# Patient Record
Sex: Male | Born: 1945 | Race: Black or African American | Hispanic: No | Marital: Married | Smoking: Former smoker
Health system: Southern US, Community
[De-identification: ages and names within clinical notes are randomized; demographics above are authoritative.]

## PROBLEM LIST (undated history)

## (undated) DIAGNOSIS — I10 Essential (primary) hypertension: Secondary | ICD-10-CM

---

## 1998-07-18 ENCOUNTER — Emergency Department (HOSPITAL_COMMUNITY): Admission: EM | Admit: 1998-07-18 | Discharge: 1998-07-18 | Payer: Self-pay | Admitting: Emergency Medicine

## 1998-07-19 ENCOUNTER — Encounter: Payer: Self-pay | Admitting: Internal Medicine

## 1998-07-19 ENCOUNTER — Inpatient Hospital Stay (HOSPITAL_COMMUNITY): Admission: EM | Admit: 1998-07-19 | Discharge: 1998-07-21 | Payer: Self-pay | Admitting: Emergency Medicine

## 1999-05-02 ENCOUNTER — Emergency Department (HOSPITAL_COMMUNITY): Admission: EM | Admit: 1999-05-02 | Discharge: 1999-05-02 | Payer: Self-pay | Admitting: Emergency Medicine

## 1999-05-02 ENCOUNTER — Encounter: Payer: Self-pay | Admitting: Emergency Medicine

## 2002-10-17 ENCOUNTER — Encounter: Payer: Self-pay | Admitting: Internal Medicine

## 2002-10-17 ENCOUNTER — Encounter: Admission: RE | Admit: 2002-10-17 | Discharge: 2002-10-17 | Payer: Self-pay | Admitting: Internal Medicine

## 2008-04-08 ENCOUNTER — Ambulatory Visit: Payer: Self-pay | Admitting: Internal Medicine

## 2008-04-15 ENCOUNTER — Other Ambulatory Visit: Admission: RE | Admit: 2008-04-15 | Discharge: 2008-04-15 | Payer: Self-pay | Admitting: Internal Medicine

## 2008-04-15 LAB — COMPREHENSIVE METABOLIC PANEL
ALT: 13 U/L (ref 0–53)
AST: 17 U/L (ref 0–37)
Albumin: 4.1 g/dL (ref 3.5–5.2)
Alkaline Phosphatase: 59 U/L (ref 39–117)
BUN: 12 mg/dL (ref 6–23)
CO2: 26 mEq/L (ref 19–32)
Calcium: 8.6 mg/dL (ref 8.4–10.5)
Chloride: 101 mEq/L (ref 96–112)
Creatinine, Ser: 0.83 mg/dL (ref 0.40–1.50)
Glucose, Bld: 102 mg/dL — ABNORMAL HIGH (ref 70–99)
Potassium: 3.9 mEq/L (ref 3.5–5.3)
Sodium: 142 mEq/L (ref 135–145)
Total Bilirubin: 0.3 mg/dL (ref 0.3–1.2)
Total Protein: 7.7 g/dL (ref 6.0–8.3)

## 2008-04-15 LAB — CBC WITH DIFFERENTIAL/PLATELET
BASO%: 0.6 % (ref 0.0–2.0)
Basophils Absolute: 0 10*3/uL (ref 0.0–0.1)
EOS%: 0.5 % (ref 0.0–7.0)
Eosinophils Absolute: 0 10*3/uL (ref 0.0–0.5)
HCT: 41.5 % (ref 38.7–49.9)
HGB: 13.9 g/dL (ref 13.0–17.1)
LYMPH%: 53 % — ABNORMAL HIGH (ref 14.0–48.0)
MCH: 31 pg (ref 28.0–33.4)
MCHC: 33.4 g/dL (ref 32.0–35.9)
MCV: 92.6 fL (ref 81.6–98.0)
MONO#: 0.7 10*3/uL (ref 0.1–0.9)
MONO%: 8.2 % (ref 0.0–13.0)
NEUT#: 3.3 10*3/uL (ref 1.5–6.5)
NEUT%: 37.7 % — ABNORMAL LOW (ref 40.0–75.0)
Platelets: 276 10*3/uL (ref 145–400)
RBC: 4.49 10*6/uL (ref 4.20–5.71)
RDW: 16.8 % — ABNORMAL HIGH (ref 11.2–14.6)
WBC: 8.8 10*3/uL (ref 4.0–10.0)
lymph#: 4.6 10*3/uL — ABNORMAL HIGH (ref 0.9–3.3)

## 2008-04-18 LAB — FLOW CYTOMETRY

## 2008-04-24 LAB — CBC WITH DIFFERENTIAL/PLATELET
Basophils Absolute: 0 10*3/uL (ref 0.0–0.1)
Eosinophils Absolute: 0 10*3/uL (ref 0.0–0.5)
HCT: 42.9 % (ref 38.7–49.9)
HGB: 14.3 g/dL (ref 13.0–17.1)
LYMPH%: 62.2 % — ABNORMAL HIGH (ref 14.0–48.0)
MCHC: 33.2 g/dL (ref 32.0–35.9)
MONO#: 0.5 10*3/uL (ref 0.1–0.9)
NEUT#: 2.8 10*3/uL (ref 1.5–6.5)
NEUT%: 31 % — ABNORMAL LOW (ref 40.0–75.0)
Platelets: 219 10*3/uL (ref 145–400)
WBC: 9 10*3/uL (ref 4.0–10.0)
lymph#: 5.6 10*3/uL — ABNORMAL HIGH (ref 0.9–3.3)

## 2008-07-19 ENCOUNTER — Ambulatory Visit: Payer: Self-pay | Admitting: Internal Medicine

## 2010-04-07 ENCOUNTER — Encounter (INDEPENDENT_AMBULATORY_CARE_PROVIDER_SITE_OTHER): Payer: Self-pay | Admitting: Internal Medicine

## 2010-04-07 ENCOUNTER — Ambulatory Visit (HOSPITAL_COMMUNITY)
Admission: RE | Admit: 2010-04-07 | Discharge: 2010-04-07 | Payer: Self-pay | Source: Home / Self Care | Attending: Internal Medicine | Admitting: Internal Medicine

## 2011-05-03 ENCOUNTER — Encounter: Payer: Self-pay | Admitting: Emergency Medicine

## 2011-05-03 ENCOUNTER — Emergency Department (HOSPITAL_COMMUNITY)
Admission: EM | Admit: 2011-05-03 | Discharge: 2011-05-03 | Disposition: A | Discharge status: Home / Self Care | Payer: Medicare HMO | Attending: Emergency Medicine | Admitting: Emergency Medicine

## 2011-05-03 ENCOUNTER — Emergency Department (HOSPITAL_COMMUNITY): Payer: Medicare HMO

## 2011-05-03 DIAGNOSIS — Z79899 Other long term (current) drug therapy: Secondary | ICD-10-CM | POA: Insufficient documentation

## 2011-05-03 DIAGNOSIS — S39012A Strain of muscle, fascia and tendon of lower back, initial encounter: Secondary | ICD-10-CM

## 2011-05-03 DIAGNOSIS — M25559 Pain in unspecified hip: Secondary | ICD-10-CM | POA: Insufficient documentation

## 2011-05-03 DIAGNOSIS — Z7982 Long term (current) use of aspirin: Secondary | ICD-10-CM | POA: Insufficient documentation

## 2011-05-03 DIAGNOSIS — Z794 Long term (current) use of insulin: Secondary | ICD-10-CM | POA: Insufficient documentation

## 2011-05-03 DIAGNOSIS — M545 Low back pain, unspecified: Secondary | ICD-10-CM | POA: Insufficient documentation

## 2011-05-03 DIAGNOSIS — S335XXA Sprain of ligaments of lumbar spine, initial encounter: Secondary | ICD-10-CM | POA: Insufficient documentation

## 2011-05-03 DIAGNOSIS — X58XXXA Exposure to other specified factors, initial encounter: Secondary | ICD-10-CM | POA: Insufficient documentation

## 2011-05-03 HISTORY — DX: Essential (primary) hypertension: I10

## 2011-05-03 LAB — URINALYSIS, ROUTINE W REFLEX MICROSCOPIC
Hgb urine dipstick: NEGATIVE
Ketones, ur: NEGATIVE mg/dL
Protein, ur: NEGATIVE mg/dL
Urobilinogen, UA: 0.2 mg/dL (ref 0.0–1.0)

## 2011-05-03 LAB — URINE MICROSCOPIC-ADD ON

## 2011-05-03 LAB — POCT I-STAT, CHEM 8
BUN: 15 mg/dL (ref 6–23)
Chloride: 102 mEq/L (ref 96–112)
Creatinine, Ser: 0.8 mg/dL (ref 0.50–1.35)
Sodium: 139 mEq/L (ref 135–145)
TCO2: 26 mmol/L (ref 0–100)

## 2011-05-03 MED ORDER — IBUPROFEN 600 MG PO TABS: 600.0000 mg | ORAL_TABLET | Freq: Four times a day (QID) | ORAL | Status: AC | PRN

## 2011-05-03 MED ORDER — HYDROMORPHONE HCL PF 2 MG/ML IJ SOLN
2.0000 mg | Freq: Once | INTRAMUSCULAR | Status: AC
  Administered 2011-05-03: 2 mg via SUBCUTANEOUS
  Filled 2011-05-03: qty 1

## 2011-05-03 MED ORDER — HYDROCODONE-ACETAMINOPHEN 5-500 MG PO TABS: 1.0000 | ORAL_TABLET | Freq: Four times a day (QID) | ORAL | Status: AC | PRN

## 2011-05-03 MED ORDER — FUROSEMIDE 10 MG/ML IJ SOLN
40.0000 mg | Freq: Once | INTRAMUSCULAR | Status: AC
  Administered 2011-05-03: 40 mg via INTRAVENOUS
  Filled 2011-05-03 (×2): qty 4

## 2011-05-03 NOTE — ED Provider Notes (Signed)
History     CSN: [id number]  Arrival date & time 05/03/11  1250   First MD Initiated Contact with Patient 05/03/11 1711      Chief Complaint  Patient presents with  . Flank Pain    (Consider location/radiation/quality/duration/timing/severity/associated sxs/prior treatment) HPI history from patient Patient is a 66 year old male with history of diabetes, hypertension, dyslipidemia who presents with right flank pain. This started about 2 weeks ago. It has been constant. It occasionally radiates around to the side of his right abdomen. It is worse with certain positions and bending his trunk. He has not had any associated dysuria, hematuria, nausea, vomiting, diarrhea. He is also not had any lower extremity pain or neurologic changes. He states he saw his PCP who thought this might be related to his kidney but he has no prior diagnosis of kidney stones. He is tolerating by mouth well. He has not had any recent fever. Overall severity noted to be moderate.  Past Medical History  Diagnosis Date  . Diabetes mellitus   . Hypertension     History reviewed. No pertinent past surgical history.  History reviewed. No pertinent family history.  History  Substance Use Topics  . Smoking status: Never Smoker   . Smokeless tobacco: Not on file  . Alcohol Use: Yes      Review of Systems  Constitutional: Negative for fever, chills and activity change.  HENT: Negative for congestion and neck pain.   Respiratory: Negative for cough, chest tightness, shortness of breath and wheezing.   Cardiovascular: Negative for chest pain.  Gastrointestinal: Negative for nausea, vomiting, abdominal pain, diarrhea and abdominal distention.  Genitourinary: Negative for difficulty urinating.  Musculoskeletal: Negative for gait problem.  Skin: Negative for rash.  Neurological: Negative for weakness and numbness.  Psychiatric/Behavioral: Negative for behavioral problems and confusion.  All other systems  reviewed and are negative.    Allergies  Review of patient's allergies indicates no known allergies.  Home Medications   Current Outpatient Rx  Name Route Sig Dispense Refill  . AMLODIPINE BESYLATE 10 MG PO TABS Oral Take 10 mg by mouth daily.      . ASPIRIN 325 MG PO TABS Oral Take 325 mg by mouth daily.      . ATORVASTATIN CALCIUM 40 MG PO TABS Oral Take 40 mg by mouth daily.      . CHLORTHALIDONE 25 MG PO TABS Oral Take 25 mg by mouth daily.      . GLYBURIDE 5 MG PO TABS Oral Take 5 mg by mouth 2 (two) times daily with a meal.      . INSULIN ISOPHANE HUMAN 100 UNIT/ML  SUSP Subcutaneous Inject 25-30 Units into the skin 2 (two) times daily. Take 30 units in the morning and 25 units in the evening     . LISINOPRIL 40 MG PO TABS Oral Take 40 mg by mouth daily.      Marland Kitchen METFORMIN HCL 1000 MG PO TABS Oral Take 1,000 mg by mouth 2 (two) times daily with a meal.      . METOPROLOL SUCCINATE ER 50 MG PO TB24 Oral Take 50 mg by mouth daily.      Marland Kitchen HYDROCODONE-ACETAMINOPHEN 5-500 MG PO TABS Oral Take 1-2 tablets by mouth every 6 (six) hours as needed for pain. 20 tablet 0  . IBUPROFEN 600 MG PO TABS Oral Take 1 tablet (600 mg total) by mouth every 6 (six) hours as needed for pain. 20 tablet 0    BP 130/72  Pulse 69  Temp(Src) 98.7 F (37.1 C) (Oral)  Resp 20  SpO2 95%  Physical Exam  Nursing note and vitals reviewed. Constitutional: He is oriented to person, place, and time. He appears well-developed and well-nourished. No distress.  HENT:  Head: Normocephalic.  Nose: Nose normal.  Eyes: EOM are normal. Pupils are equal, round, and reactive to light.  Neck: Normal range of motion. Neck supple.  Cardiovascular: Normal rate, regular rhythm and intact distal pulses.        2+ distal pulses and bilateral DPs.  Pulmonary/Chest: Effort normal and breath sounds normal. No respiratory distress. He exhibits no tenderness.  Abdominal: Soft. He exhibits no distension. There is no tenderness.    Genitourinary:       Penis normal, uncircumcised No testicle tenderness palpation  Musculoskeletal: Normal range of motion. He exhibits no edema and no tenderness.       Mild tenderness to palpation along right lumbar paraspinal area.  Neurological: He is alert and oriented to person, place, and time.       Normal strength in bilateral lower extremities Normal gross sensation throughout.  Skin: Skin is warm and dry. He is not diaphoretic.  Psychiatric: He has a normal mood and affect. His behavior is normal. Thought content normal.    ED Course  Procedures (including critical care time)  Labs Reviewed  URINALYSIS, ROUTINE W REFLEX MICROSCOPIC - Abnormal; Notable for the following:    Leukocytes, UA SMALL (*)    All other components within normal limits  POCT I-STAT, CHEM 8 - Abnormal; Notable for the following:    Glucose, Bld 110 (*)    All other components within normal limits  URINE MICROSCOPIC-ADD ON  I-STAT, CHEM 8   Ct Abdomen Pelvis Wo Contrast  05/03/2011  *RADIOLOGY REPORT*  Clinical Data: Right flank pain. Hip pain.  CT ABDOMEN AND PELVIS WITHOUT CONTRAST  Technique:  Multidetector CT imaging of the abdomen and pelvis was performed following the standard protocol without intravenous contrast.  Comparison: None.  Findings: Lung bases are clear.  No evidence of free air.  No gross abnormality to the liver, gallbladder, spleen, pancreas and adrenal glands.  Normal appearance of both kidneys without stones or hydronephrosis.  Normal appearance of the appendix.  There are a few left-sided colonic diverticula without acute inflammation. Normal appearance of the prostate, seminal vesicles and urinary bladder.  No evidence for free fluid or lymphadenopathy.  No acute bony abnormality. There is spinal canal narrowing in the lower lumbar spine with marked disc space disease at L5-S1. There may be a hyperdense right paracentral disc herniation at L5-S1.  IMPRESSION:  No acute abdominal or  pelvic abnormalities.  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  This area could be further evaluated with MRI if area of concern.  Original Report Authenticated By: Richarda Overlie, M.D.     1. Lumbar strain       MDM   Patient here with right flank pain. It has been constant and overall is atypical for nephrolithiasis, however based on his reported prior history of kidney issue will evaluate for this. He also has risk factors for vascular issues and we will evaluate his aorta with the CT scan. UA, chemistry panel and CT scan are unremarkable.  There is no nephrolithiasis or AAA. He does feel better after interventions. The exam and modifying factors point to acute lumbar strain. We will have him follow up with primary care doctor. Return precautions discussed.  Milus Glazier 05/04/11 [phone]

## 2011-05-03 NOTE — ED Notes (Signed)
Pt c/o right sided flank pain x 2 weeks; pt denies fever or burning with urination

## 2011-05-04 NOTE — ED Provider Notes (Signed)
I have seen and examined this patient with the resident.  I agree with the resident's note, assessment and plan except as indicated.     Zaim Nitta A Mia Milan, MD 05/04/11 2347 

## 2012-03-28 ENCOUNTER — Other Ambulatory Visit (HOSPITAL_COMMUNITY): Payer: Self-pay | Admitting: *Deleted

## 2012-03-28 DIAGNOSIS — R0989 Other specified symptoms and signs involving the circulatory and respiratory systems: Secondary | ICD-10-CM

## 2012-03-30 ENCOUNTER — Ambulatory Visit (HOSPITAL_COMMUNITY)
Admission: RE | Admit: 2012-03-30 | Discharge: 2012-03-30 | Disposition: A | Discharge status: Home / Self Care | Payer: Medicare HMO | Source: Ambulatory Visit | Attending: Internal Medicine | Admitting: Internal Medicine

## 2012-03-30 DIAGNOSIS — R0989 Other specified symptoms and signs involving the circulatory and respiratory systems: Secondary | ICD-10-CM | POA: Insufficient documentation

## 2012-03-30 DIAGNOSIS — E119 Type 2 diabetes mellitus without complications: Secondary | ICD-10-CM | POA: Insufficient documentation

## 2012-03-30 DIAGNOSIS — R42 Dizziness and giddiness: Secondary | ICD-10-CM | POA: Insufficient documentation

## 2012-03-30 DIAGNOSIS — E785 Hyperlipidemia, unspecified: Secondary | ICD-10-CM | POA: Insufficient documentation

## 2012-03-30 DIAGNOSIS — I10 Essential (primary) hypertension: Secondary | ICD-10-CM | POA: Insufficient documentation

## 2015-01-27 ENCOUNTER — Telehealth: Payer: Self-pay

## 2015-01-31 NOTE — Telephone Encounter (Signed)
Prior auth for Livalo 4 mg sent to [facility] and already denied. I have appealed to [facility].

## 2015-03-26 ENCOUNTER — Ambulatory Visit
Admission: RE | Admit: 2015-03-26 | Discharge: 2015-03-26 | Disposition: A | Discharge status: Home / Self Care | Payer: Medicare Other | Source: Ambulatory Visit | Attending: Internal Medicine | Admitting: Internal Medicine

## 2015-03-26 ENCOUNTER — Other Ambulatory Visit: Payer: Self-pay | Admitting: Internal Medicine

## 2015-03-26 DIAGNOSIS — R05 Cough: Secondary | ICD-10-CM

## 2015-03-26 DIAGNOSIS — R059 Cough, unspecified: Secondary | ICD-10-CM

## 2015-04-09 ENCOUNTER — Encounter: Payer: Self-pay | Admitting: Endocrinology

## 2015-04-09 ENCOUNTER — Ambulatory Visit (INDEPENDENT_AMBULATORY_CARE_PROVIDER_SITE_OTHER): Payer: Medicare Other | Admitting: Endocrinology

## 2015-04-09 VITALS — BP 146/74 | HR 73 | Temp 98.9°F | Ht 72.0 in | Wt 260.0 lb

## 2015-04-09 DIAGNOSIS — E119 Type 2 diabetes mellitus without complications: Secondary | ICD-10-CM

## 2015-04-09 DIAGNOSIS — Z794 Long term (current) use of insulin: Secondary | ICD-10-CM

## 2015-04-09 MED ORDER — INSULIN GLARGINE 100 UNIT/ML ~~LOC~~ SOLN: 150.0000 [IU] | SUBCUTANEOUS | Status: DC

## 2015-04-09 NOTE — Patient Instructions (Addendum)
good diet and exercise significantly improve the control of your diabetes.  please let me know if you wish to be referred to a dietician.  high blood sugar is very risky to your health.  you should see an eye doctor and dentist every year.  It is very important to get all recommended vaccinations.  controlling your blood pressure and cholesterol drastically reduces the damage diabetes does to your body.  Those who smoke should quit.  please discuss these with your doctor.  check your blood sugar once a day.  vary the time of day when you check, between before the 3 meals, and at bedtime.  also check if you have symptoms of your blood sugar being too high or too low.  please keep a record of the readings and bring it to your next appointment here (or you can bring the meter itself).  You can write it on any piece of paper.  please call us sooner if your blood sugar goes below 70, or if you have a lot of readings over 200. Please change the NPH insulin to lantus, 150 units each morning.  Please stop taking the glimepiride pill, but please continue the same metformin.  Please call us next week, to tell us how the blood sugar is doing.  Please come back for a follow-up appointment in 1 month.

## 2015-04-09 NOTE — Progress Notes (Signed)
Subjective:    Patient ID: Manuel Chapman, male    DOB: 10/21/1945, 69 y.o.   MRN: [id number]  HPI pt states DM was dx'ed in 2008; he has mild neuropathy of the lower extremities, but no associated chronic complications; he has been on insulin since 2010; pt says his diet and exercise are good; he has never had pancreatitis, severe hypoglycemia or DKA.  He takes BID NPH, and 2 oral meds.  He has not recently checked cbg's, saying he dislikes doing so.  He also wants to reduce the frequency of insulin injections.   Past Medical History  Diagnosis Date  . Diabetes mellitus   . Hypertension     No past surgical history on file.  Social History   Social History  . Marital Status: Single    Spouse Name: N/A  . Number of Children: N/A  . Years of Education: N/A   Occupational History  . Not on file.   Social History Main Topics  . Smoking status: Never Smoker   . Smokeless tobacco: Not on file  . Alcohol Use: Yes  . Drug Use: No  . Sexual Activity: Not on file   Other Topics Concern  . Not on file   Social History Narrative    Current Outpatient Prescriptions on File Prior to Visit  Medication Sig Dispense Refill  . amLODipine (NORVASC) 10 MG tablet Take 10 mg by mouth daily.      Marland Kitchen. aspirin 325 MG tablet Take 325 mg by mouth daily.      Marland Kitchen. atorvastatin (LIPITOR) 40 MG tablet Take 40 mg by mouth daily.      . chlorthalidone (HYGROTON) 25 MG tablet Take 25 mg by mouth daily.      Marland Kitchen. lisinopril (PRINIVIL,ZESTRIL) 40 MG tablet Take 40 mg by mouth daily.      . metFORMIN (GLUCOPHAGE) 1000 MG tablet Take 1,000 mg by mouth 2 (two) times daily with a meal.      . metoprolol (TOPROL-XL) 50 MG 24 hr tablet Take 50 mg by mouth daily.       No current facility-administered medications on file prior to visit.    No Known Allergies  Family History  Problem Relation Age of Onset  . Diabetes Neg Hx     BP 146/74 mmHg  Pulse 73  Temp(Src) 98.9 F (37.2 C) (Oral)  Ht 6' (1.829  m)  Wt 260 lb (117.935 kg)  BMI 35.25 kg/m2  SpO2 96%   Review of Systems denies weight loss, blurry vision, headache, chest pain, sob, n/v, urinary frequency, muscle cramps, excessive diaphoresis, memory loss, cold intolerance, rhinorrhea, and easy bruising.        Objective:   Physical Exam VS: see vs page GEN: no distress HEAD: head: no deformity eyes: no periorbital swelling, no proptosis external nose and ears are normal mouth: no lesion seen NECK: supple, thyroid is not enlarged CHEST WALL: no deformity LUNGS: clear to auscultation BREASTS:  No gynecomastia CV: reg rate and rhythm; high-pitched systolic murmur, rad to left carotid ABD: abdomen is soft, nontender.  no hepatosplenomegaly.  not distended.  no hernia MUSCULOSKELETAL: muscle bulk and strength are grossly normal.  no obvious joint swelling.  gait is normal and steady EXTEMITIES: no deformity.  no ulcer on the feet, but the skin is dry.  feet are of normal color and temp.  1+ bilat leg edema PULSES: dorsalis pedis intact bilat.  no carotid bruit NEURO:  cn 2-12 grossly intact.  readily moves all 4's.  sensation is intact to touch on the feet SKIN:  Normal texture and temperature.  No rash or suspicious lesion is visible.   NODES:  None palpable at the neck PSYCH: alert, well-oriented.  Does not appear anxious nor depressed.   outside test results are reviewed: A1c=9.8%  I have reviewed outside records, and summarized: Pt was noted to have elevated a1c, and referred here.      Assessment & Plan:  DM: severe exacerbation Morbid obesity: new to me.  he declines surgery. Noncompliance with cbg recording: I'll work around this as best I can, which is to limit frequency of cbg's and insulin injections.  We'll also d/c glimepiride to simplify regimen as best we can.     Patient is advised the following: Patient Instructions  good diet and exercise significantly improve the control of your diabetes.  please  let me know if you wish to be referred to a dietician.  high blood sugar is very risky to your health.  you should see an eye doctor and dentist every year.  It is very important to get all recommended vaccinations.  controlling your blood pressure and cholesterol drastically reduces the damage diabetes does to your body.  Those who smoke should quit.  please discuss these with your doctor.  check your blood sugar once a day.  vary the time of day when you check, between before the 3 meals, and at bedtime.  also check if you have symptoms of your blood sugar being too high or too low.  please keep a record of the readings and bring it to your next appointment here (or you can bring the meter itself).  You can write it on any piece of paper.  please call us sooner if your blood sugar goes below 70, or if you have a lot of readings over 200. Please change the NPH insulin to lantus, 150 units each morning.  Please stop taking the glimepiride pill, but please continue the same metformin.  Please call us next week, to tell us how the blood sugar is doing.  Please come back for a follow-up appointment in 1 month.

## 2015-04-10 ENCOUNTER — Encounter: Payer: Self-pay | Admitting: Endocrinology

## 2015-04-10 DIAGNOSIS — E119 Type 2 diabetes mellitus without complications: Secondary | ICD-10-CM | POA: Insufficient documentation

## 2015-04-11 ENCOUNTER — Telehealth: Payer: Self-pay | Admitting: Endocrinology

## 2015-04-11 NOTE — Telephone Encounter (Signed)
please call patient: lantus has gone generic  The generic is called "basaglar." On 04/27/15, the basaglar is likely to be a preferred drug Please continue the same insulin for now, then re-try to get the basaglar after the new year. Please call us if you need advice with this.

## 2015-04-14 NOTE — Telephone Encounter (Signed)
I contacted the pt and advised of the note below. Pt voiced understanding.  

## 2015-04-22 ENCOUNTER — Other Ambulatory Visit: Payer: Self-pay | Admitting: *Deleted

## 2015-04-22 MED ORDER — INSULIN DETEMIR 100 UNIT/ML ~~LOC~~ SOLN: 150.0000 [IU] | SUBCUTANEOUS | Status: DC

## 2015-05-09 ENCOUNTER — Encounter: Payer: Self-pay | Admitting: Endocrinology

## 2015-05-09 ENCOUNTER — Ambulatory Visit (INDEPENDENT_AMBULATORY_CARE_PROVIDER_SITE_OTHER): Payer: Medicare Other | Admitting: Endocrinology

## 2015-05-09 VITALS — BP 138/76 | HR 75 | Temp 98.2°F | Ht 73.0 in | Wt 265.0 lb

## 2015-05-09 DIAGNOSIS — Z794 Long term (current) use of insulin: Secondary | ICD-10-CM | POA: Diagnosis not present

## 2015-05-09 DIAGNOSIS — M109 Gout, unspecified: Secondary | ICD-10-CM

## 2015-05-09 DIAGNOSIS — I10 Essential (primary) hypertension: Secondary | ICD-10-CM

## 2015-05-09 DIAGNOSIS — Z9119 Patient's noncompliance with other medical treatment and regimen: Secondary | ICD-10-CM

## 2015-05-09 DIAGNOSIS — E119 Type 2 diabetes mellitus without complications: Secondary | ICD-10-CM

## 2015-05-09 DIAGNOSIS — E785 Hyperlipidemia, unspecified: Secondary | ICD-10-CM

## 2015-05-09 NOTE — Patient Instructions (Addendum)
Please continue the same insulin for now.  Please come back for a follow-up appointment in 1 month.  check your blood sugar twice a day.  vary the time of day when you check, between before the 3 meals, and at bedtime.  also check if you have symptoms of your blood sugar being too high or too low.  please keep a record of the readings and bring it to your next appointment here (or you can bring the meter itself).  You can write it on any piece of paper.  please call us sooner if your blood sugar goes below 70, or if you have a lot of readings over 200. Please call next week, to tell us how the blood sugar is doing.

## 2015-05-09 NOTE — Progress Notes (Signed)
Subjective:    Patient ID: Manuel Chapman, male    DOB: Oct 25, 1945, 70 y.o.   MRN: [id number]  HPI Pt returns for f/u of diabetes mellitus: DM type: Insulin-requiring type 2 Dx'ed: 2008 Complications: polyneuropathy Therapy: insulin since 2010 DKA: never Severe hypoglycemia: never Pancreatitis: never Other: he declines bariatric surgery; he does not check cbg's; he declines multiple daily injections. Interval history: He says he cannot afford insulin analogs.  He takes NPH, 65 units bid.  i asked dtr, who says pt has cbg strips, but does not use them.   Past Medical History  Diagnosis Date  . Diabetes mellitus   . Hypertension     No past surgical history on file.  Social History   Social History  . Marital Status: Single    Spouse Name: N/A  . Number of Children: N/A  . Years of Education: N/A   Occupational History  . Not on file.   Social History Main Topics  . Smoking status: Never Smoker   . Smokeless tobacco: Not on file  . Alcohol Use: Yes  . Drug Use: No  . Sexual Activity: Not on file   Other Topics Concern  . Not on file   Social History Narrative    Current Outpatient Prescriptions on File Prior to Visit  Medication Sig Dispense Refill  . allopurinol (ZYLOPRIM) 300 MG tablet Take 300 mg by mouth daily.    Marland Kitchen amLODipine (NORVASC) 10 MG tablet Take 10 mg by mouth daily.      Marland Kitchen aspirin 325 MG tablet Take 325 mg by mouth daily.      Marland Kitchen atorvastatin (LIPITOR) 40 MG tablet Take 40 mg by mouth daily.      . chlorthalidone (HYGROTON) 25 MG tablet Take 25 mg by mouth daily.      Marland Kitchen lisinopril (PRINIVIL,ZESTRIL) 40 MG tablet Take 40 mg by mouth daily.      . metFORMIN (GLUCOPHAGE) 1000 MG tablet Take 1,000 mg by mouth 2 (two) times daily with a meal.      . metoprolol (TOPROL-XL) 50 MG 24 hr tablet Take 50 mg by mouth daily.       No current facility-administered medications on file prior to visit.    No Known Allergies  Family History  Problem Relation  Age of Onset  . Diabetes Neg Hx     BP 138/76 mmHg  Pulse 75  Temp(Src) 98.2 F (36.8 C) (Oral)  Ht 6\' 1"  (1.854 m)  Wt 265 lb (120.203 kg)  BMI 34.97 kg/m2  SpO2 97%   Review of Systems He denies hypoglycemia.      Objective:   Physical Exam VITAL SIGNS:  See vs page.  GENERAL: no distress. Pulses: dorsalis pedis intact bilat.    MSK: no deformity of the feet.  CV: 1+ bilat leg edema.  Skin:  no ulcer on the feet.  normal color and temp on the feet. Neuro: sensation is intact to touch on the feet, but decreased from normal.        Assessment & Plan:  DM: therapy limited by noncompliance with cbg monitoring.  We discussed.  He says he'll try to check cbg's.  Patient is advised the following: Patient Instructions  Please continue the same insulin for now.  Please come back for a follow-up appointment in 1 month.  check your blood sugar twice a day.  vary the time of day when you check, between before the 3 meals, and at bedtime.  also check  if you have symptoms of your blood sugar being too high or too low.  please keep a record of the readings and bring it to your next appointment here (or you can bring the meter itself).  You can write it on any piece of paper.  please call us sooner if your blood sugar goes below 70, or if you have a lot of readings over 200. Please call next week, to tell us how the blood sugar is doing.

## 2015-05-10 DIAGNOSIS — I10 Essential (primary) hypertension: Secondary | ICD-10-CM | POA: Insufficient documentation

## 2015-05-10 DIAGNOSIS — E785 Hyperlipidemia, unspecified: Secondary | ICD-10-CM | POA: Insufficient documentation

## 2015-05-10 DIAGNOSIS — M109 Gout, unspecified: Secondary | ICD-10-CM | POA: Insufficient documentation

## 2016-01-27 ENCOUNTER — Other Ambulatory Visit: Payer: Self-pay | Admitting: Internal Medicine

## 2016-01-27 ENCOUNTER — Ambulatory Visit
Admission: RE | Admit: 2016-01-27 | Discharge: 2016-01-27 | Disposition: A | Discharge status: Home / Self Care | Payer: Medicare Other | Source: Ambulatory Visit | Attending: Internal Medicine | Admitting: Internal Medicine

## 2016-01-27 DIAGNOSIS — M25551 Pain in right hip: Secondary | ICD-10-CM

## 2016-11-29 IMAGING — CR DG HIP (WITH OR WITHOUT PELVIS) 2-3V*R*
2 series · 2 of 2 positions shown · non-contrast
Comparison: CT scan of the abdomen and pelvis dated May 03, 2011

CLINICAL DATA: Right hip pain for the past 2 weeks with no known
injury

EXAM:
DG HIP (WITH OR WITHOUT PELVIS) 2-3V RIGHT

[w pelvis *]
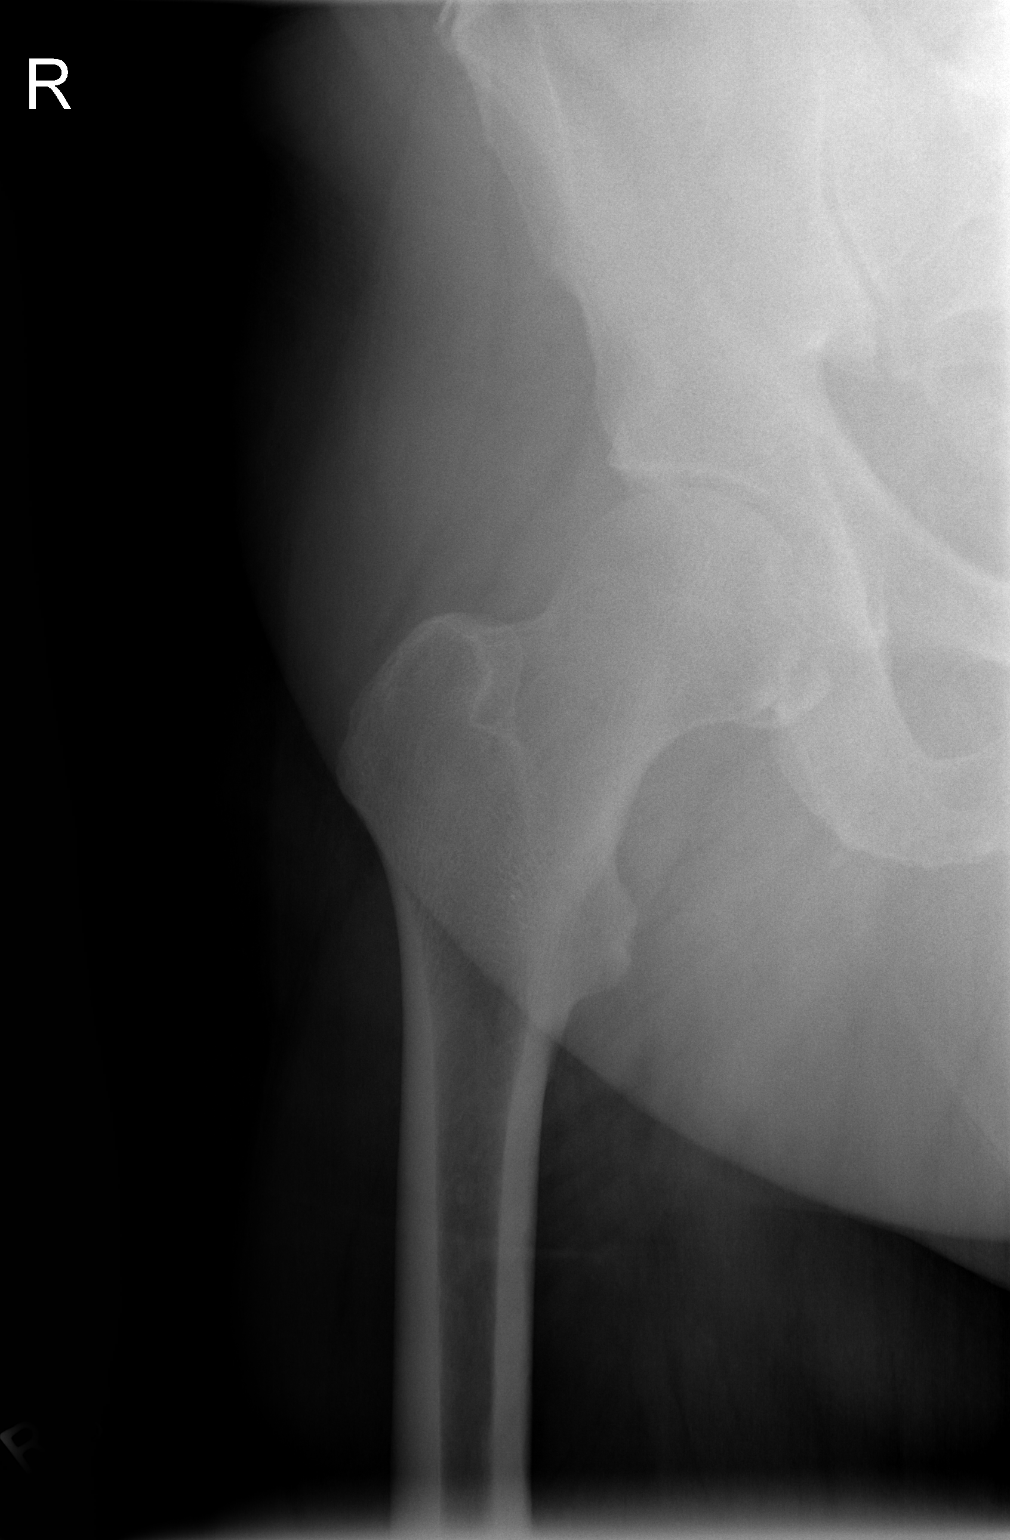

[t hip ap right]
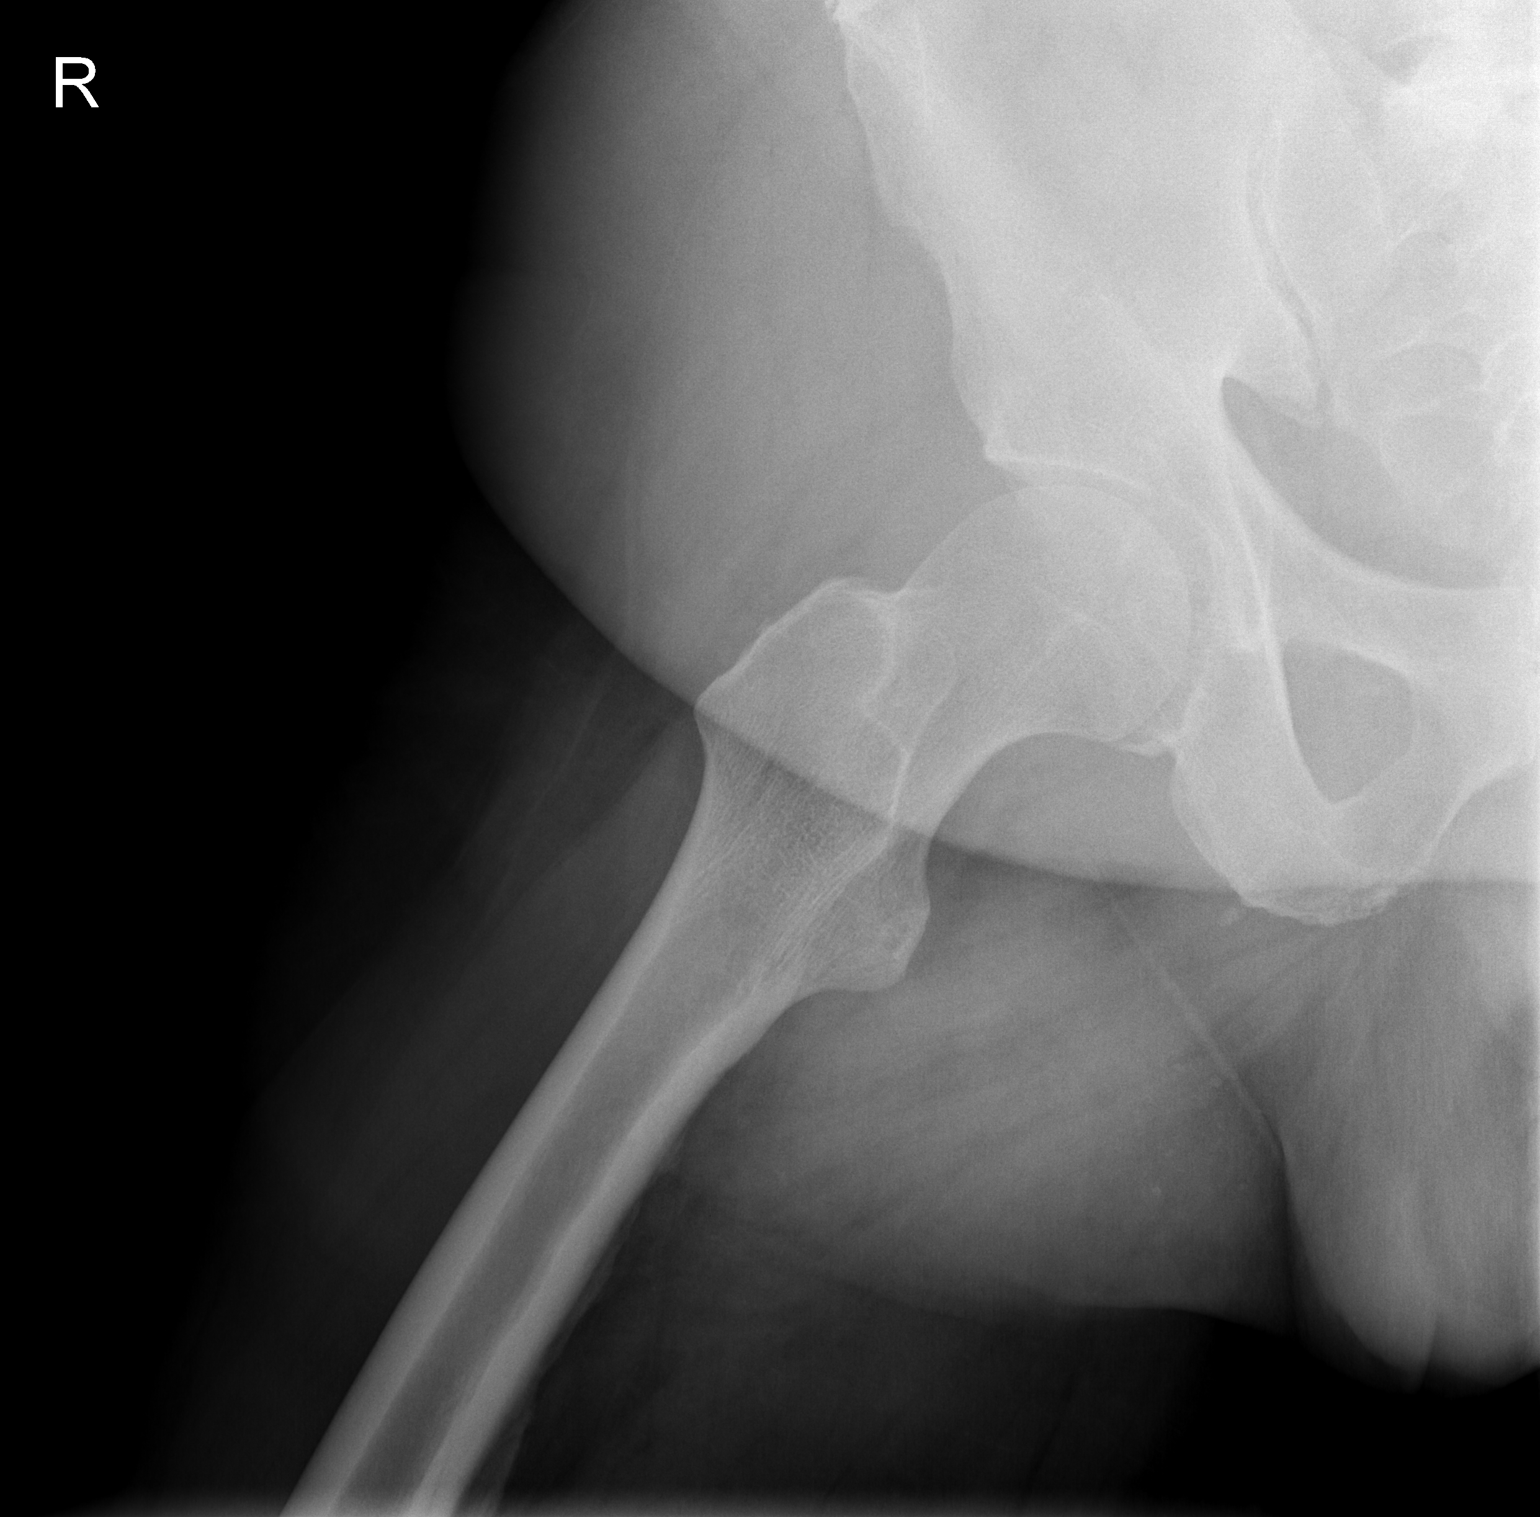

[2 of 2 positions shown; findings below may reference images not displayed]

FINDINGS: The bones are subjectively adequately mineralized. There is minimal
narrowing of the right hip joint space superiorly. The articular
surfaces remains smoothly rounded. The femoral neck,
intertrochanteric, and subtrochanteric regions are normal. The
observed portions of the right hemipelvis are normal.
IMPRESSION: There is minimal narrowing of the superior aspect of the right hip
joint space. There is no acute bony abnormality.

## 2018-04-26 DIAGNOSIS — I639 Cerebral infarction, unspecified: Secondary | ICD-10-CM

## 2018-04-26 HISTORY — DX: Cerebral infarction, unspecified: I63.9

## 2019-11-25 DIAGNOSIS — I619 Nontraumatic intracerebral hemorrhage, unspecified: Secondary | ICD-10-CM

## 2019-11-25 HISTORY — DX: Nontraumatic intracerebral hemorrhage, unspecified: I61.9

## 2019-11-28 ENCOUNTER — Emergency Department (HOSPITAL_COMMUNITY): Payer: Medicare HMO

## 2019-11-28 ENCOUNTER — Other Ambulatory Visit: Payer: Self-pay

## 2019-11-28 ENCOUNTER — Inpatient Hospital Stay (HOSPITAL_COMMUNITY)
Admission: EM | Admit: 2019-11-28 | Discharge: 2019-12-04 | DRG: 064 | Disposition: A | Discharge status: Skilled Nursing Facility | Payer: Medicare HMO | Attending: Neurology | Admitting: Neurology

## 2019-11-28 DIAGNOSIS — R739 Hyperglycemia, unspecified: Secondary | ICD-10-CM

## 2019-11-28 DIAGNOSIS — N179 Acute kidney failure, unspecified: Secondary | ICD-10-CM | POA: Diagnosis present

## 2019-11-28 DIAGNOSIS — R4189 Other symptoms and signs involving cognitive functions and awareness: Secondary | ICD-10-CM | POA: Diagnosis present

## 2019-11-28 DIAGNOSIS — E119 Type 2 diabetes mellitus without complications: Secondary | ICD-10-CM

## 2019-11-28 DIAGNOSIS — M25562 Pain in left knee: Secondary | ICD-10-CM | POA: Diagnosis present

## 2019-11-28 DIAGNOSIS — E785 Hyperlipidemia, unspecified: Secondary | ICD-10-CM | POA: Diagnosis present

## 2019-11-28 DIAGNOSIS — E876 Hypokalemia: Secondary | ICD-10-CM | POA: Diagnosis present

## 2019-11-28 DIAGNOSIS — R1310 Dysphagia, unspecified: Secondary | ICD-10-CM | POA: Diagnosis present

## 2019-11-28 DIAGNOSIS — Z6836 Body mass index (BMI) 36.0-36.9, adult: Secondary | ICD-10-CM

## 2019-11-28 DIAGNOSIS — R29810 Facial weakness: Secondary | ICD-10-CM | POA: Diagnosis present

## 2019-11-28 DIAGNOSIS — I6340 Cerebral infarction due to embolism of unspecified cerebral artery: Principal | ICD-10-CM | POA: Diagnosis present

## 2019-11-28 DIAGNOSIS — R471 Dysarthria and anarthria: Secondary | ICD-10-CM | POA: Diagnosis present

## 2019-11-28 DIAGNOSIS — Z23 Encounter for immunization: Secondary | ICD-10-CM

## 2019-11-28 DIAGNOSIS — I10 Essential (primary) hypertension: Secondary | ICD-10-CM | POA: Diagnosis present

## 2019-11-28 DIAGNOSIS — L89152 Pressure ulcer of sacral region, stage 2: Secondary | ICD-10-CM | POA: Diagnosis present

## 2019-11-28 DIAGNOSIS — M109 Gout, unspecified: Secondary | ICD-10-CM | POA: Diagnosis present

## 2019-11-28 DIAGNOSIS — Y900 Blood alcohol level of less than 20 mg/100 ml: Secondary | ICD-10-CM | POA: Diagnosis present

## 2019-11-28 DIAGNOSIS — L8990 Pressure ulcer of unspecified site, unspecified stage: Secondary | ICD-10-CM | POA: Insufficient documentation

## 2019-11-28 DIAGNOSIS — E1165 Type 2 diabetes mellitus with hyperglycemia: Secondary | ICD-10-CM | POA: Diagnosis not present

## 2019-11-28 DIAGNOSIS — W19XXXA Unspecified fall, initial encounter: Secondary | ICD-10-CM

## 2019-11-28 DIAGNOSIS — R531 Weakness: Secondary | ICD-10-CM | POA: Diagnosis present

## 2019-11-28 DIAGNOSIS — Z7289 Other problems related to lifestyle: Secondary | ICD-10-CM

## 2019-11-28 DIAGNOSIS — M25552 Pain in left hip: Secondary | ICD-10-CM | POA: Diagnosis present

## 2019-11-28 DIAGNOSIS — E162 Hypoglycemia, unspecified: Secondary | ICD-10-CM

## 2019-11-28 DIAGNOSIS — E669 Obesity, unspecified: Secondary | ICD-10-CM | POA: Diagnosis present

## 2019-11-28 DIAGNOSIS — Z79899 Other long term (current) drug therapy: Secondary | ICD-10-CM

## 2019-11-28 DIAGNOSIS — E11649 Type 2 diabetes mellitus with hypoglycemia without coma: Secondary | ICD-10-CM | POA: Diagnosis present

## 2019-11-28 DIAGNOSIS — I619 Nontraumatic intracerebral hemorrhage, unspecified: Secondary | ICD-10-CM

## 2019-11-28 DIAGNOSIS — F959 Tic disorder, unspecified: Secondary | ICD-10-CM | POA: Diagnosis present

## 2019-11-28 DIAGNOSIS — I615 Nontraumatic intracerebral hemorrhage, intraventricular: Secondary | ICD-10-CM | POA: Diagnosis present

## 2019-11-28 DIAGNOSIS — D62 Acute posthemorrhagic anemia: Secondary | ICD-10-CM | POA: Diagnosis present

## 2019-11-28 DIAGNOSIS — Z794 Long term (current) use of insulin: Secondary | ICD-10-CM

## 2019-11-28 DIAGNOSIS — Z20822 Contact with and (suspected) exposure to covid-19: Secondary | ICD-10-CM | POA: Diagnosis present

## 2019-11-28 DIAGNOSIS — M25512 Pain in left shoulder: Secondary | ICD-10-CM | POA: Diagnosis present

## 2019-11-28 DIAGNOSIS — R29702 NIHSS score 2: Secondary | ICD-10-CM | POA: Diagnosis present

## 2019-11-28 LAB — DIFFERENTIAL
Abs Immature Granulocytes: 0.03 10*3/uL (ref 0.00–0.07)
Basophils Absolute: 0 10*3/uL (ref 0.0–0.1)
Basophils Relative: 0 %
Eosinophils Absolute: 0 10*3/uL (ref 0.0–0.5)
Eosinophils Relative: 0 %
Immature Granulocytes: 0 %
Lymphocytes Relative: 13 %
Lymphs Abs: 1.1 10*3/uL (ref 0.7–4.0)
Monocytes Absolute: 0.6 10*3/uL (ref 0.1–1.0)
Monocytes Relative: 8 %
Neutro Abs: 6.4 10*3/uL (ref 1.7–7.7)
Neutrophils Relative %: 79 %

## 2019-11-28 LAB — PROTIME-INR
INR: 0.9 (ref 0.8–1.2)
Prothrombin Time: 12.2 seconds (ref 11.4–15.2)

## 2019-11-28 LAB — I-STAT CHEM 8, ED
BUN: 23 mg/dL (ref 8–23)
Calcium, Ion: 1.18 mmol/L (ref 1.15–1.40)
Chloride: 100 mmol/L (ref 98–111)
Creatinine, Ser: 1.2 mg/dL (ref 0.61–1.24)
Glucose, Bld: 91 mg/dL (ref 70–99)
HCT: 39 % (ref 39.0–52.0)
Hemoglobin: 13.3 g/dL (ref 13.0–17.0)
Potassium: 3.3 mmol/L — ABNORMAL LOW (ref 3.5–5.1)
Sodium: 141 mmol/L (ref 135–145)
TCO2: 25 mmol/L (ref 22–32)

## 2019-11-28 LAB — COMPREHENSIVE METABOLIC PANEL
ALT: 16 U/L (ref 0–44)
AST: 35 U/L (ref 15–41)
Albumin: 4.4 g/dL (ref 3.5–5.0)
Alkaline Phosphatase: 103 U/L (ref 38–126)
Anion gap: 16 — ABNORMAL HIGH (ref 5–15)
BUN: 21 mg/dL (ref 8–23)
CO2: 22 mmol/L (ref 22–32)
Calcium: 9.6 mg/dL (ref 8.9–10.3)
Chloride: 97 mmol/L — ABNORMAL LOW (ref 98–111)
Creatinine, Ser: 1.36 mg/dL — ABNORMAL HIGH (ref 0.61–1.24)
GFR calc Af Amer: 58 mL/min — ABNORMAL LOW (ref >60)
GFR calc non Af Amer: 50 mL/min — ABNORMAL LOW (ref >60)
Glucose, Bld: 92 mg/dL (ref 70–99)
Potassium: 3.3 mmol/L — ABNORMAL LOW (ref 3.5–5.1)
Sodium: 135 mmol/L (ref 135–145)
Total Bilirubin: 0.8 mg/dL (ref 0.3–1.2)
Total Protein: 9 g/dL — ABNORMAL HIGH (ref 6.5–8.1)

## 2019-11-28 LAB — CBC
HCT: 39.1 % (ref 39.0–52.0)
Hemoglobin: 12.5 g/dL — ABNORMAL LOW (ref 13.0–17.0)
MCH: 29.8 pg (ref 26.0–34.0)
MCHC: 32 g/dL (ref 30.0–36.0)
MCV: 93.3 fL (ref 80.0–100.0)
Platelets: 237 10*3/uL (ref 150–400)
RBC: 4.19 MIL/uL — ABNORMAL LOW (ref 4.22–5.81)
RDW: 13.9 % (ref 11.5–15.5)
WBC: 8.2 10*3/uL (ref 4.0–10.5)
nRBC: 0 % (ref 0.0–0.2)

## 2019-11-28 LAB — ETHANOL: Alcohol, Ethyl (B): <10 mg/dL (ref <10)

## 2019-11-28 LAB — APTT: aPTT: 27 seconds (ref 24–36)

## 2019-11-28 MED ORDER — IOHEXOL 350 MG/ML SOLN
80.0000 mL | Freq: Once | INTRAVENOUS | Status: AC | PRN
  Administered 2019-11-28: 80 mL via INTRAVENOUS

## 2019-11-28 NOTE — ED Triage Notes (Signed)
Pt arrived via ems due to a diabetic complaint. EMS was called out around noon for a diabetic complaint. Family reported left side facial droop and slurred speech. BS was 350 pt refused care with EMS.  Family called ems a second time around 9pm for increasing L side facial droop and slurred speech. cbg was 62 pt given food -post cbg 110.  Arrival to ED cbg 62

## 2019-11-28 NOTE — ED Notes (Signed)
Patient transported to CT 

## 2019-11-28 NOTE — ED Provider Notes (Signed)
[facility] EMERGENCY DEPARTMENT Provider Note   CSN: [id number] Arrival date & time: 11/28/19  2145     History Chief Complaint  Patient presents with  . Stroke Symptoms    Manuel Chapman is a 74 y.o. male. Presented to ER with concern for strokelike symptoms.  History obtained from patient, daughter, EMS report.  Daughter reported that patient had fall around 11:00 yesterday evening, she was called this morning to check on patient, helped him up around 11 AM, called EMS to evaluated patient, his blood sugar was elevated, patient refused transport with EMS at the time. EMS was called second time with concern for facial droop and slurred speech. Patient did not have any facial droop or aphasia, no stroke alert. Noted CBG 62. Currently patient denies any acute complaints, specifically denies any chest pain, back pain, abdominal pain, nausea or vomiting, denies any vision changes, numbness or weakness.  HPI     Past Medical History:  Diagnosis Date  . Diabetes mellitus   . Hypertension     Patient Active Problem List   Diagnosis Date Noted  . Gout 05/10/2015  . HTN (hypertension) 05/10/2015  . Dyslipidemia 05/10/2015  . Diabetes (HCC) 04/10/2015    No past surgical history on file.     Family History  Problem Relation Age of Onset  . Diabetes Neg Hx     Social History   Tobacco Use  . Smoking status: Never Smoker  Substance Use Topics  . Alcohol use: Yes  . Drug use: No    Home Medications Prior to Admission medications   Medication Sig Start Date End Date Taking? Authorizing Provider  acetaminophen (TYLENOL) 325 MG tablet Take 650 mg by mouth every 6 (six) hours as needed for mild pain or moderate pain.   Yes [provider]  allopurinol (ZYLOPRIM) 300 MG tablet Take 300 mg by mouth daily.   Yes [provider]  amLODipine (NORVASC) 10 MG tablet Take 10 mg by mouth daily.     Yes [provider]  atorvastatin  (LIPITOR) 40 MG tablet Take 40 mg by mouth daily.     Yes [provider]  chlorthalidone (HYGROTON) 25 MG tablet Take 25 mg by mouth daily.     Yes [provider]  glimepiride (AMARYL) 2 MG tablet Take 2 mg by mouth daily. 11/06/19  Yes [provider]  Insulin NPH Human, Isophane, (NOVOLIN N Duval) Inject 65 Units into the skin 2 (two) times daily.   Yes [provider]  levofloxacin (LEVAQUIN) 750 MG tablet Take 750 mg by mouth daily.  09/17/19  Yes [provider]  lisinopril (PRINIVIL,ZESTRIL) 40 MG tablet Take 40 mg by mouth daily.     Yes [provider]  metFORMIN (GLUCOPHAGE) 1000 MG tablet Take 1,000 mg by mouth 2 (two) times daily with a meal.     Yes [provider]  metoprolol (TOPROL-XL) 50 MG 24 hr tablet Take 50 mg by mouth daily.     Yes [provider]  pioglitazone (ACTOS) 45 MG tablet Take 45 mg by mouth daily. 11/06/19  Yes [provider]    Allergies    Patient has no known allergies.  Review of Systems   Review of Systems  Constitutional: Negative for chills and fever.  HENT: Negative for ear pain and sore throat.   Eyes: Negative for pain and visual disturbance.  Respiratory: Negative for cough and shortness of breath.   Cardiovascular: Negative for chest  pain and palpitations.  Gastrointestinal: Negative for abdominal pain and vomiting.  Genitourinary: Negative for dysuria and hematuria.  Musculoskeletal: Negative for arthralgias and back pain.  Skin: Negative for color change and rash.  Neurological: Positive for facial asymmetry and weakness. Negative for seizures and syncope.  All other systems reviewed and are negative.   Physical Exam Updated Vital Signs BP (!) 149/67 (BP Location: Right Arm)   Pulse 89   Temp 98.9 F (37.2 C) (Oral)   Resp 20   Ht 5\' 6"  (1.676 m)   Wt 93 kg   SpO2 92%   BMI 33.09 kg/m   Physical Exam Vitals and nursing note reviewed.  Constitutional:       Appearance: He is well-developed.  HENT:     Head: Normocephalic and atraumatic.  Eyes:     Conjunctiva/sclera: Conjunctivae normal.  Cardiovascular:     Rate and Rhythm: Normal rate and regular rhythm.     Heart sounds: No murmur heard.   Pulmonary:     Effort: Pulmonary effort is normal. No respiratory distress.     Breath sounds: Normal breath sounds.  Abdominal:     Palpations: Abdomen is soft.     Tenderness: There is no abdominal tenderness.  Musculoskeletal:        General: No deformity or signs of injury.     Cervical back: Neck supple.  Skin:    General: Skin is warm and dry.  Neurological:     Mental Status: He is alert.     Comments: Alert, mild confusion, answers most questions appropriately CN 2-12 intact, speech clear visual fields intact 5/5 strength in b/l UE and LE Sensation to light touch intact in b/l UE and LE Normal FNF Normal gait     ED Results / Procedures / Treatments   Labs (all labs ordered are listed, but only abnormal results are displayed) Labs Reviewed  CBC - Abnormal; Notable for the following components:      Result Value   RBC 4.19 (*)    Hemoglobin 12.5 (*)    All other components within normal limits  I-STAT CHEM 8, ED - Abnormal; Notable for the following components:   Potassium 3.3 (*)    All other components within normal limits  PROTIME-INR  APTT  DIFFERENTIAL  ETHANOL  COMPREHENSIVE METABOLIC PANEL  RAPID URINE DRUG SCREEN, HOSP PERFORMED  URINALYSIS, ROUTINE W REFLEX MICROSCOPIC    EKG EKG Interpretation  Date/Time:  Wednesday November 28 2019 21:48:14 EDT Ventricular Rate:  82 PR Interval:    QRS Duration: 92 QT Interval:  519 QTC Calculation: 607 R Axis:   -46 Text Interpretation: Sinus or ectopic atrial rhythm Left anterior fascicular block Borderline T wave abnormalities Prolonged QT interval Confirmed by [id number] (Marianna Fuss) on 11/28/2019 9:55:38 PM   Radiology DG Chest 2 View  Result Date:  11/28/2019 CLINICAL DATA:  Shortness of breath EXAM: CHEST - 2 VIEW COMPARISON:  03/26/2015 FINDINGS: Shallow inspiration. Heart size and pulmonary vascularity are normal. Lungs are clear. No pleural effusions. No pneumothorax. Degenerative changes in the spine. Calcification of the aorta. IMPRESSION: No active cardiopulmonary disease. Electronically Signed   By: 03/28/2015 M.D.   On: 11/28/2019 23:07    Procedures Procedures (including critical care time)  Medications Ordered in ED Medications - No data to display  ED Course  I have reviewed the triage vital signs and the nursing notes.  Pertinent labs & imaging results that were available during my care of the  patient were reviewed by me and considered in my medical decision making (see chart for details).  Clinical Course as of Nov 28 2326  Wed Nov 28, 2019  2151 Assessed patient soon after arrival, no focal deficits on my exam   [RD]  2256 Fell around 11pm last night, found by daughter this am ~11am, paramedics came to house ~12pm;    [RD]  2259 [phone] - Thana Farr   [RD]  2313 Signout to [place]   [RD]    Clinical Course User Index [RD] Milagros Loll, MD   MDM Rules/Calculators/A&P                         74 year old male presenting to ER with concern for fall, reported strokelike symptoms, hypoglycemia. On arrival in ER, patient noted to have some mild confusion but no focal neurologic deficits were appreciated on my exam. Vital signs stable, repeat glucose improved. Will check labs, CT to further evaluate.  While awaiting remainder of labs, CT, reassessment, patient signed out to Dr. Clayborne Dana.  Final Clinical Impression(s) / ED Diagnoses Final diagnoses:  Hypoglycemia  Fall, initial encounter  Type 2 diabetes mellitus without complication, unspecified whether long term insulin use (HCC)    Rx / DC Orders ED Discharge Orders    None       Milagros Loll, MD 11/28/19 2328

## 2019-11-29 ENCOUNTER — Encounter (HOSPITAL_COMMUNITY): Payer: Self-pay | Admitting: Neurology

## 2019-11-29 ENCOUNTER — Inpatient Hospital Stay (HOSPITAL_COMMUNITY): Payer: Medicare HMO

## 2019-11-29 ENCOUNTER — Emergency Department (HOSPITAL_COMMUNITY): Payer: Medicare HMO

## 2019-11-29 DIAGNOSIS — M109 Gout, unspecified: Secondary | ICD-10-CM | POA: Diagnosis present

## 2019-11-29 DIAGNOSIS — Z23 Encounter for immunization: Secondary | ICD-10-CM | POA: Diagnosis not present

## 2019-11-29 DIAGNOSIS — E1165 Type 2 diabetes mellitus with hyperglycemia: Secondary | ICD-10-CM | POA: Diagnosis not present

## 2019-11-29 DIAGNOSIS — R1310 Dysphagia, unspecified: Secondary | ICD-10-CM | POA: Diagnosis present

## 2019-11-29 DIAGNOSIS — Z20822 Contact with and (suspected) exposure to covid-19: Secondary | ICD-10-CM | POA: Diagnosis present

## 2019-11-29 DIAGNOSIS — R471 Dysarthria and anarthria: Secondary | ICD-10-CM | POA: Diagnosis present

## 2019-11-29 DIAGNOSIS — Z6836 Body mass index (BMI) 36.0-36.9, adult: Secondary | ICD-10-CM | POA: Diagnosis not present

## 2019-11-29 DIAGNOSIS — R531 Weakness: Secondary | ICD-10-CM | POA: Diagnosis present

## 2019-11-29 DIAGNOSIS — M25552 Pain in left hip: Secondary | ICD-10-CM | POA: Diagnosis present

## 2019-11-29 DIAGNOSIS — I615 Nontraumatic intracerebral hemorrhage, intraventricular: Secondary | ICD-10-CM | POA: Diagnosis present

## 2019-11-29 DIAGNOSIS — I619 Nontraumatic intracerebral hemorrhage, unspecified: Secondary | ICD-10-CM

## 2019-11-29 DIAGNOSIS — E119 Type 2 diabetes mellitus without complications: Secondary | ICD-10-CM | POA: Diagnosis not present

## 2019-11-29 DIAGNOSIS — L89152 Pressure ulcer of sacral region, stage 2: Secondary | ICD-10-CM | POA: Diagnosis present

## 2019-11-29 DIAGNOSIS — R29702 NIHSS score 2: Secondary | ICD-10-CM | POA: Diagnosis present

## 2019-11-29 DIAGNOSIS — Y900 Blood alcohol level of less than 20 mg/100 ml: Secondary | ICD-10-CM | POA: Diagnosis present

## 2019-11-29 DIAGNOSIS — I610 Nontraumatic intracerebral hemorrhage in hemisphere, subcortical: Secondary | ICD-10-CM

## 2019-11-29 DIAGNOSIS — I6340 Cerebral infarction due to embolism of unspecified cerebral artery: Secondary | ICD-10-CM | POA: Diagnosis present

## 2019-11-29 DIAGNOSIS — Z7289 Other problems related to lifestyle: Secondary | ICD-10-CM | POA: Diagnosis not present

## 2019-11-29 DIAGNOSIS — I350 Nonrheumatic aortic (valve) stenosis: Secondary | ICD-10-CM

## 2019-11-29 DIAGNOSIS — I10 Essential (primary) hypertension: Secondary | ICD-10-CM | POA: Diagnosis present

## 2019-11-29 DIAGNOSIS — E876 Hypokalemia: Secondary | ICD-10-CM | POA: Diagnosis present

## 2019-11-29 DIAGNOSIS — N179 Acute kidney failure, unspecified: Secondary | ICD-10-CM | POA: Diagnosis present

## 2019-11-29 DIAGNOSIS — W19XXXA Unspecified fall, initial encounter: Secondary | ICD-10-CM | POA: Diagnosis not present

## 2019-11-29 DIAGNOSIS — E11649 Type 2 diabetes mellitus with hypoglycemia without coma: Secondary | ICD-10-CM | POA: Diagnosis present

## 2019-11-29 DIAGNOSIS — M25562 Pain in left knee: Secondary | ICD-10-CM | POA: Diagnosis present

## 2019-11-29 DIAGNOSIS — E669 Obesity, unspecified: Secondary | ICD-10-CM | POA: Diagnosis present

## 2019-11-29 DIAGNOSIS — R29810 Facial weakness: Secondary | ICD-10-CM | POA: Diagnosis present

## 2019-11-29 DIAGNOSIS — D62 Acute posthemorrhagic anemia: Secondary | ICD-10-CM | POA: Diagnosis present

## 2019-11-29 DIAGNOSIS — F959 Tic disorder, unspecified: Secondary | ICD-10-CM | POA: Diagnosis present

## 2019-11-29 DIAGNOSIS — E785 Hyperlipidemia, unspecified: Secondary | ICD-10-CM | POA: Diagnosis present

## 2019-11-29 DIAGNOSIS — R739 Hyperglycemia, unspecified: Secondary | ICD-10-CM | POA: Diagnosis not present

## 2019-11-29 LAB — ECHOCARDIOGRAM COMPLETE
AR max vel: 0.84 cm2
AV Area VTI: 0.86 cm2
AV Area mean vel: 1 cm2
AV Mean grad: 23.7 mmHg
AV Peak grad: 45.3 mmHg
Ao pk vel: 3.36 m/s
Area-P 1/2: 2.62 cm2
Height: 66 in
S' Lateral: 3.3 cm
Weight: 3280 oz

## 2019-11-29 LAB — LIPID PANEL
Cholesterol: 157 mg/dL (ref 0–200)
HDL: 54 mg/dL (ref >40)
LDL Cholesterol: 90 mg/dL (ref 0–99)
Total CHOL/HDL Ratio: 2.9 RATIO
Triglycerides: 66 mg/dL (ref <150)
VLDL: 13 mg/dL (ref 0–40)

## 2019-11-29 LAB — CBG MONITORING, ED
Glucose-Capillary: 129 mg/dL — ABNORMAL HIGH (ref 70–99)
Glucose-Capillary: 212 mg/dL — ABNORMAL HIGH (ref 70–99)
Glucose-Capillary: 63 mg/dL — ABNORMAL LOW (ref 70–99)

## 2019-11-29 LAB — HEMOGLOBIN A1C
Hgb A1c MFr Bld: 13.3 % — ABNORMAL HIGH (ref 4.8–5.6)
Mean Plasma Glucose: 335.01 mg/dL

## 2019-11-29 LAB — GLUCOSE, CAPILLARY
Glucose-Capillary: 202 mg/dL — ABNORMAL HIGH (ref 70–99)
Glucose-Capillary: 187 mg/dL — ABNORMAL HIGH (ref 70–99)

## 2019-11-29 LAB — SARS CORONAVIRUS 2 BY RT PCR (HOSPITAL ORDER, PERFORMED IN ~~LOC~~ HOSPITAL LAB): SARS Coronavirus 2: NEGATIVE

## 2019-11-29 LAB — MRSA PCR SCREENING: MRSA by PCR: NEGATIVE

## 2019-11-29 MED ORDER — MORPHINE SULFATE (PF) 2 MG/ML IV SOLN
1.0000 mg | Freq: Once | INTRAVENOUS | Status: AC
  Administered 2019-11-29: 1 mg via INTRAVENOUS
  Filled 2019-11-29: qty 1

## 2019-11-29 MED ORDER — PNEUMOCOCCAL VAC POLYVALENT 25 MCG/0.5ML IJ INJ
0.5000 mL | INJECTION | INTRAMUSCULAR | Status: AC
  Administered 2019-11-30: 0.5 mL via INTRAMUSCULAR
  Filled 2019-11-29: qty 0.5

## 2019-11-29 MED ORDER — GADOBUTROL 1 MMOL/ML IV SOLN
9.0000 mL | Freq: Once | INTRAVENOUS | Status: AC | PRN
  Administered 2019-11-29: 9 mL via INTRAVENOUS

## 2019-11-29 MED ORDER — ACETAMINOPHEN 650 MG RE SUPP: 650.0000 mg | RECTAL | Status: DC | PRN

## 2019-11-29 MED ORDER — SODIUM CHLORIDE 0.9 % IV SOLN
INTRAVENOUS | Status: DC | PRN
  Administered 2019-11-29: 1000 mL via INTRAVENOUS

## 2019-11-29 MED ORDER — INSULIN ASPART 100 UNIT/ML ~~LOC~~ SOLN
0.0000 [IU] | Freq: Every day | SUBCUTANEOUS | Status: DC
  Administered 2019-12-02: 2 [IU] via SUBCUTANEOUS

## 2019-11-29 MED ORDER — CHLORHEXIDINE GLUCONATE CLOTH 2 % EX PADS
6.0000 | MEDICATED_PAD | Freq: Every day | CUTANEOUS | Status: DC
  Administered 2019-11-30 – 2019-12-04 (×5): 6 via TOPICAL

## 2019-11-29 MED ORDER — ACETAMINOPHEN 160 MG/5ML PO SOLN: 650.0000 mg | ORAL | Status: DC | PRN

## 2019-11-29 MED ORDER — INSULIN DETEMIR 100 UNIT/ML ~~LOC~~ SOLN
10.0000 [IU] | Freq: Two times a day (BID) | SUBCUTANEOUS | Status: DC
  Administered 2019-11-29: 10 [IU] via SUBCUTANEOUS
  Filled 2019-11-29 (×4): qty 0.1

## 2019-11-29 MED ORDER — PANTOPRAZOLE SODIUM 40 MG IV SOLR
40.0000 mg | Freq: Every day | INTRAVENOUS | Status: DC
  Administered 2019-11-29: 40 mg via INTRAVENOUS
  Filled 2019-11-29: qty 40

## 2019-11-29 MED ORDER — ACETAMINOPHEN 325 MG PO TABS
650.0000 mg | ORAL_TABLET | ORAL | Status: DC | PRN
  Administered 2019-11-30 – 2019-12-02 (×2): 650 mg via ORAL
  Filled 2019-11-29 (×2): qty 2

## 2019-11-29 MED ORDER — PERFLUTREN LIPID MICROSPHERE
1.0000 mL | INTRAVENOUS | Status: AC | PRN
  Administered 2019-11-29: 2 mL via INTRAVENOUS
  Filled 2019-11-29: qty 10

## 2019-11-29 MED ORDER — CLEVIDIPINE BUTYRATE 0.5 MG/ML IV EMUL
0.0000 mg/h | INTRAVENOUS | Status: DC
  Administered 2019-11-29: 10 mg/h via INTRAVENOUS
  Administered 2019-11-29: 1 mg/h via INTRAVENOUS
  Administered 2019-11-29: 8 mg/h via INTRAVENOUS
  Administered 2019-11-29: 3 mg/h via INTRAVENOUS
  Administered 2019-11-29: 9 mg/h via INTRAVENOUS
  Administered 2019-11-29: 4 mg/h via INTRAVENOUS
  Administered 2019-11-30: 16 mg/h via INTRAVENOUS
  Administered 2019-11-30: 11 mg/h via INTRAVENOUS
  Administered 2019-11-30: 16 mg/h via INTRAVENOUS
  Administered 2019-11-30 (×2): 11 mg/h via INTRAVENOUS
  Filled 2019-11-29 (×12): qty 50

## 2019-11-29 MED ORDER — INSULIN ASPART 100 UNIT/ML ~~LOC~~ SOLN
0.0000 [IU] | Freq: Three times a day (TID) | SUBCUTANEOUS | Status: DC
  Administered 2019-11-29: 3 [IU] via SUBCUTANEOUS
  Administered 2019-11-30: 7 [IU] via SUBCUTANEOUS
  Administered 2019-11-30: 2 [IU] via SUBCUTANEOUS
  Administered 2019-11-30 – 2019-12-01 (×2): 3 [IU] via SUBCUTANEOUS
  Administered 2019-12-01: 2 [IU] via SUBCUTANEOUS
  Administered 2019-12-01: 3 [IU] via SUBCUTANEOUS
  Administered 2019-12-02 – 2019-12-03 (×4): 2 [IU] via SUBCUTANEOUS
  Administered 2019-12-03: 3 [IU] via SUBCUTANEOUS
  Administered 2019-12-04 (×2): 2 [IU] via SUBCUTANEOUS

## 2019-11-29 MED ORDER — STROKE: EARLY STAGES OF RECOVERY BOOK: Freq: Once | Status: DC

## 2019-11-29 MED ORDER — SENNOSIDES-DOCUSATE SODIUM 8.6-50 MG PO TABS
1.0000 | ORAL_TABLET | Freq: Two times a day (BID) | ORAL | Status: DC
  Administered 2019-11-29 – 2019-12-04 (×10): 1 via ORAL
  Filled 2019-11-29 (×10): qty 1

## 2019-11-29 MED ORDER — LABETALOL HCL 5 MG/ML IV SOLN
5.0000 mg | INTRAVENOUS | Status: DC | PRN
  Administered 2019-11-29 – 2019-11-30 (×3): 5 mg via INTRAVENOUS
  Filled 2019-11-29 (×3): qty 4

## 2019-11-29 NOTE — Progress Notes (Addendum)
Inpatient Diabetes Program Recommendations  AACE/ADA: New Consensus Statement on Inpatient Glycemic Control (2015)  Target Ranges:  Prepandial:   less than 140 mg/dL      Peak postprandial:   less than 180 mg/dL (1-2 hours)      Critically ill patients:  140 - 180 mg/dL   Lab Results  Component Value Date   GLUCAP 129 (H) 11/29/2019   HGBA1C 13.3 (H) 11/28/2019    Review of Glycemic Control Results for Manuel Chapman, Manuel Chapman (MRN [id number]) as of 11/29/2019 12:42  Ref. Range 11/28/2019 21:44 11/29/2019 00:08  Glucose-Capillary Latest Ref Range: 70 - 99 mg/dL 63 (L) 612 (H)   Diabetes history: DM 2 Outpatient Diabetes medications:  NPH 65 units bid, Amaryl 2 mg daily, Metformin 1000 mg bid, Actos 45 mg daily Current orders for Inpatient glycemic control:  None Inpatient Diabetes Program Recommendations:    Please add Novolog correction sensitive q 4 hours.  May consider also adding Levemir 15 units bid.   Thanks,  Beryl Meager, RN, BC-ADM Inpatient Diabetes Coordinator Pager [phone] (8a-5p)

## 2019-11-29 NOTE — Progress Notes (Signed)
STROKE TEAM PROGRESS NOTE   INTERVAL HISTORY His eldest and middle dtrs are at the bedside.  He states he fell on the bed, but complains of L knee and hip pain.  I have personally reviewed history of presenting illness with the patient, daughters and electronic medical records as well as imaging films in PACS.  MRI scan of the brain shows hemorrhagic left subcortical and the rest small right temporal embolic infarcts.  Blood pressure adequately controlled.  CT angiogram of the brain and neck did not show significant large vessel stenosis or occlusion.  Vitals:   11/29/19 0808 11/29/19 0858 11/29/19 0945 11/29/19 0948  BP: 119/60 116/83 (!) 156/66   Pulse: 75 78 71 70  Resp: 10 14  14   Temp:      TempSrc:      SpO2: 98% 98% 99% 100%  Weight:      Height:       CBC:  Recent Labs  Lab 11/28/19 2232  WBC 8.2  NEUTROABS 6.4  HGB 12.5*  13.3  HCT 39.1  39.0  MCV 93.3  PLT 237   Basic Metabolic Panel:  Recent Labs  Lab 11/28/19 2232  NA 135  141  K 3.3*  3.3*  CL 97*  100  CO2 22  GLUCOSE 92  91  BUN 21  23  CREATININE 1.36*  1.20  CALCIUM 9.6   Lipid Panel: No results for input(s): CHOL, TRIG, HDL, CHOLHDL, VLDL, LDLCALC in the last 168 hours. HgbA1c: No results for input(s): HGBA1C in the last 168 hours. Urine Drug Screen: No results for input(s): LABOPIA, COCAINSCRNUR, LABBENZ, AMPHETMU, THCU, LABBARB in the last 168 hours.  Alcohol Level  Recent Labs  Lab 11/28/19 2232  ETH <10    IMAGING past 24 hours CT Angio Head W or Wo Contrast  Result Date: 11/29/2019 CLINICAL DATA:  Left-sided weakness and facial droop EXAM: CT ANGIOGRAPHY HEAD AND NECK TECHNIQUE: Multidetector CT imaging of the head and neck was performed using the standard protocol during bolus administration of intravenous contrast. Multiplanar CT image reconstructions and MIPs were obtained to evaluate the vascular anatomy. Carotid stenosis measurements (when applicable) are obtained utilizing  NASCET criteria, using the distal internal carotid diameter as the denominator. CONTRAST:  54mL OMNIPAQUE IOHEXOL 350 MG/ML SOLN COMPARISON:  None. FINDINGS: CT HEAD FINDINGS Brain: There is intraparenchymal hemorrhage along the left caudate tail with extension into the left lateral ventricle and layering in both occipital horns. There is also a small focus of intraparenchymal or subarachnoid hemorrhage at the left posterior vertex (series 3, image 30). There is hypoattenuation of the periventricular white matter, most commonly indicating chronic ischemic microangiopathy. Skull: The visualized skull base, calvarium and extracranial soft tissues are normal. Sinuses/Orbits: No fluid levels or advanced mucosal thickening of the visualized paranasal sinuses. No mastoid or middle ear effusion. The orbits are normal. CTA NECK FINDINGS SKELETON: There is no bony spinal canal stenosis. No lytic or blastic lesion. OTHER NECK: Normal pharynx, larynx and major salivary glands. No cervical lymphadenopathy. Unremarkable thyroid gland. UPPER CHEST: No pneumothorax or pleural effusion. No nodules or masses. AORTIC ARCH: There is mild calcific atherosclerosis of the aortic arch. There is no aneurysm, dissection or hemodynamically significant stenosis of the visualized portion of the aorta. Conventional 3 vessel aortic branching pattern. The visualized proximal subclavian arteries are widely patent. RIGHT CAROTID SYSTEM: Normal without aneurysm, dissection or stenosis. LEFT CAROTID SYSTEM: Normal without aneurysm, dissection or stenosis. VERTEBRAL ARTERIES: Left dominant configuration. Both origins  are clearly patent. There is no dissection, occlusion or flow-limiting stenosis to the skull base (V1-V3 segments). CTA HEAD FINDINGS POSTERIOR CIRCULATION: --Vertebral arteries: Normal V4 segments. --Inferior cerebellar arteries: Normal. --Basilar artery: Normal. --Superior cerebellar arteries: Normal. --Posterior cerebral arteries  (PCA): Normal. ANTERIOR CIRCULATION: --Intracranial internal carotid arteries: Normal. --Anterior cerebral arteries (ACA): Normal. Both A1 segments are present. Patent anterior communicating artery (a-comm). --Middle cerebral arteries (MCA): Normal. VENOUS SINUSES: As permitted by contrast timing, patent. ANATOMIC VARIANTS: None Review of the MIP images confirms the above findings. IMPRESSION: 1. Intraparenchymal hemorrhage along the left caudate tail with extension into the left lateral ventricle and layering in both occipital horns. 2. Small focus of intraparenchymal or subarachnoid hemorrhage at the left posterior vertex and the posterior left parietal lobe. The findings together raise suspicion for hemorrhagic metastatic disease. MRI of the brain with and without contrast is recommended. 3. No intracranial arterial occlusion or high-grade stenosis. 4. Aortic Atherosclerosis (ICD10-I70.0). These results were called by telephone at the time of interpretation on 11/29/2019 at 12:16 am to provider Dr. Clayborne DanaMESNER, Who verbally acknowledged these results. Electronically Signed   By: Deatra RobinsonKevin  Herman M.D.   On: 11/29/2019 00:17   DG Chest 2 View  Result Date: 11/28/2019 CLINICAL DATA:  Shortness of breath EXAM: CHEST - 2 VIEW COMPARISON:  03/26/2015 FINDINGS: Shallow inspiration. Heart size and pulmonary vascularity are normal. Lungs are clear. No pleural effusions. No pneumothorax. Degenerative changes in the spine. Calcification of the aorta. IMPRESSION: No active cardiopulmonary disease. Electronically Signed   By: Burman NievesWilliam  Stevens M.D.   On: 11/28/2019 23:07   CT Angio Neck W and/or Wo Contrast  Result Date: 11/29/2019 CLINICAL DATA:  Left-sided weakness and facial droop EXAM: CT ANGIOGRAPHY HEAD AND NECK TECHNIQUE: Multidetector CT imaging of the head and neck was performed using the standard protocol during bolus administration of intravenous contrast. Multiplanar CT image reconstructions and MIPs were obtained to  evaluate the vascular anatomy. Carotid stenosis measurements (when applicable) are obtained utilizing NASCET criteria, using the distal internal carotid diameter as the denominator. CONTRAST:  80mL OMNIPAQUE IOHEXOL 350 MG/ML SOLN COMPARISON:  None. FINDINGS: CT HEAD FINDINGS Brain: There is intraparenchymal hemorrhage along the left caudate tail with extension into the left lateral ventricle and layering in both occipital horns. There is also a small focus of intraparenchymal or subarachnoid hemorrhage at the left posterior vertex (series 3, image 30). There is hypoattenuation of the periventricular white matter, most commonly indicating chronic ischemic microangiopathy. Skull: The visualized skull base, calvarium and extracranial soft tissues are normal. Sinuses/Orbits: No fluid levels or advanced mucosal thickening of the visualized paranasal sinuses. No mastoid or middle ear effusion. The orbits are normal. CTA NECK FINDINGS SKELETON: There is no bony spinal canal stenosis. No lytic or blastic lesion. OTHER NECK: Normal pharynx, larynx and major salivary glands. No cervical lymphadenopathy. Unremarkable thyroid gland. UPPER CHEST: No pneumothorax or pleural effusion. No nodules or masses. AORTIC ARCH: There is mild calcific atherosclerosis of the aortic arch. There is no aneurysm, dissection or hemodynamically significant stenosis of the visualized portion of the aorta. Conventional 3 vessel aortic branching pattern. The visualized proximal subclavian arteries are widely patent. RIGHT CAROTID SYSTEM: Normal without aneurysm, dissection or stenosis. LEFT CAROTID SYSTEM: Normal without aneurysm, dissection or stenosis. VERTEBRAL ARTERIES: Left dominant configuration. Both origins are clearly patent. There is no dissection, occlusion or flow-limiting stenosis to the skull base (V1-V3 segments). CTA HEAD FINDINGS POSTERIOR CIRCULATION: --Vertebral arteries: Normal V4 segments. --Inferior cerebellar arteries: Normal.  --  Basilar artery: Normal. --Superior cerebellar arteries: Normal. --Posterior cerebral arteries (PCA): Normal. ANTERIOR CIRCULATION: --Intracranial internal carotid arteries: Normal. --Anterior cerebral arteries (ACA): Normal. Both A1 segments are present. Patent anterior communicating artery (a-comm). --Middle cerebral arteries (MCA): Normal. VENOUS SINUSES: As permitted by contrast timing, patent. ANATOMIC VARIANTS: None Review of the MIP images confirms the above findings. IMPRESSION: 1. Intraparenchymal hemorrhage along the left caudate tail with extension into the left lateral ventricle and layering in both occipital horns. 2. Small focus of intraparenchymal or subarachnoid hemorrhage at the left posterior vertex and the posterior left parietal lobe. The findings together raise suspicion for hemorrhagic metastatic disease. MRI of the brain with and without contrast is recommended. 3. No intracranial arterial occlusion or high-grade stenosis. 4. Aortic Atherosclerosis (ICD10-I70.0). These results were called by telephone at the time of interpretation on 11/29/2019 at 12:16 am to provider Dr. Clayborne Dana, Who verbally acknowledged these results. Electronically Signed   By: Deatra Robinson M.D.   On: 11/29/2019 00:17   MR Brain W and Wo Contrast  Result Date: 11/29/2019 CLINICAL DATA:  Slurred speech and facial droop. EXAM: MRI HEAD WITHOUT AND WITH CONTRAST TECHNIQUE: Multiplanar, multiecho pulse sequences of the brain and surrounding structures were obtained without and with intravenous contrast. CONTRAST:  77mL GADAVIST GADOBUTROL 1 MMOL/ML IV SOLN COMPARISON:  CTA of the head neck from yesterday FINDINGS: Brain: Cluster of small hemorrhages at the left caudate body/tail measuring up to 6 mm individually. There is regional diffusion hyperintensity greater than expected for artifact. Even smaller areas of left frontal parietal convexity hemorrhage. There is a punctate focus of restricted diffusion along the upper right  temporal lobe. Small volume intraventricular hemorrhage layering in the occipital horns. No superimposed enhancement. Severe chronic small vessel ischemia with confluent FLAIR hyperintensity in the cerebral white matter and pons. No swelling or masslike finding. Remote micro hemorrhages in the bilateral inferior frontal lobes. Borderline dural thickening that is diffuse. No brain sagging or nodularity. Vascular: Normal flow voids and vascular enhancement Skull and upper cervical spine: Normal marrow signal Sinuses/Orbits: Negative IMPRESSION: 1. Known left frontal parietal, left caudate, and intraventricular hemorrhage without interval progression. 2. Although susceptibility artifact likely contributes, there appears to be true restricted diffusion at the left caudate. There is also a punctate acute infarct at the superior right temporal lobe. Overall, favor sites of hemorrhage are related to underlying infarcts. No masslike enhancement. 3. Severe chronic small vessel ischemia. Electronically Signed   By: Marnee Spring M.D.   On: 11/29/2019 05:06    PHYSICAL EXAM Pleasant elderly African-American male not in distress. . Afebrile. Head is nontraumatic. Neck is supple without bruit.    Cardiac exam no murmur or gallop. Lungs are clear to auscultation. Distal pulses are well felt. Neurological Exam ;  Sleepy but arouses easily.  Oriented x 3.  Mildly dysarthric speech.  No aphasia.[place] movements full without nystagmus.fundi were not visualized. Vision acuity and fields appear normal. Hearing is normal. Palatal movements are normal.  Mild right lower face asymmetry when he smiles.  Tongue midline. Normal strength, tone, reflexes and coordination except left leg movements limited due to pain in the knee and hip.. Normal sensation. Gait deferred.  ASSESSMENT/PLAN Manuel Chapman is a 74 y.o. male with history of hypertension, diabetes mellitus presents presenting with slurred speech and facial droop. Fell  at stroke onset.   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 L posterior vertex and posterior L parietal lobe IPH or SAH. ? Mets. Aortic atherosclerosis.   MRI w/w/o  L frontoparietal, caudate IPH w/ IVH. Punctate superior R temporal lobe infarct. L caudate head restricted diffusion. Severe small vessel disease.   2D Echo pending   LDL pending   HgbA1c pending   VTE prophylaxis - SCDs   No antithrombotic prior to admission, now on No antithrombotic given hemorrhage   Therapy recommendations:  pending   Disposition:  pending - has a 76yo dtr that lives w/ him  Await ICU bed for admission   Hypertension  Highest BP 160/72, mostly in the 140s   Home meds:  Amlodipine 10, chlorthalidone 25, lisinopril 40, metoprolol 50 xl  Stable now . SBP goal < 140 . Now on cleviprex gtt . Long-term BP goal normotensive  Hyperlipidemia  Home meds:  lipitor 40  Hold statin in setting of acute hemorrhage  LDL pending  Resume statin at discharge  Diabetes type II   Home meds:  Glimepiride 2, NPH 65u bid, metformin 1000 bid, actos 45  HgbA1c pending, goal < 7.0  CBGs Recent Labs    11/29/19 0008  GLUCAP 129*      DB RN following.  Add levemir 15 bid (10u if stays NPO) and SSI sensitive q4h  Dysphagia . Secondary to stroke . NPO . Speech on board   Other Stroke Risk Factors  Advanced age  ETOH use, alcohol level <10, advised to drink no more than 2 drink(s) a day  Obesity, Body mass index is 33.09 kg/m., recommend weight loss, diet and exercise as appropriate   Other Active Problems  Pain L knee and hip. Check XR L hip and knee (discussed w/ Dr Harvie Junior in ED)   Hospital day # 0 Patient has presented with fall and  slurred speech and facial droop and CT scan and MRI showed hemorrhagic left subcortical as well as small right temporal embolic infarct.  Left leg movements are limited more due to pain than actual weakness.  Recommend check x-rays of the left hip and knee.  Admit to ICU and close neurological monitoring and strict blood pressure control with systolic blood pressure goal below 140 for 24 hours and then below 160.  Check echocardiogram, continue cardiac telemetry monitoring.  Long discussion with the patient and answered questions. This patient is critically ill and at significant risk of neurological worsening, death and care requires constant monitoring of vital signs, hemodynamics,respiratory and cardiac monitoring, extensive review of multiple databases, frequent neurological assessment, discussion with family, other specialists and medical decision making of high complexity.I have made any additions or clarifications directly to the above note.This critical care time does not reflect procedure time, or teaching time or supervisory time of PA/NP/Med Resident etc but could involve care discussion time.  I spent 30 minutes of neurocritical care time  in the care of  this patient.    Delia Heady, MD  To contact Stroke Continuity provider, please refer to [email or web address]. After hours, contact General Neurology

## 2019-11-29 NOTE — ED Notes (Signed)
Verbal order from [place], MD pt able intake PO at this time.

## 2019-11-29 NOTE — Consult Note (Signed)
Requesting Physician: Dr. Clayborne Dana    Chief Complaint:   History obtained from: Patient and Chart   HPI:                                                                                                                                       Manuel Chapman is a 74 y.o. male with past medical history significant for hypertension, diabetes mellitus presents to the emergency department after daughter noticed slurred speech and having facial droop.  Patient had a fall yesterday 11:00 AM.  She went to check on him today and EMS was called.  Noted to have elevated blood sugar.  EMS was called second time due to worsening slurred speech and increased left facial droop.. Repeat CBG was 62, improved after oral food.  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  CTA is negative for any vascular malformations.  Date last known well: 8.3.21 Time last known well: 11pm tPA Given: No, hemorrhage NIHSS: 2 Baseline MRS 0  Intracerebral Hemorrhage (ICH) Score  Glascow Coma Score 13-15 0  Age >/= 80 yesno 0  ICH volume >/= 23ml  no 0  IVH yes +1  Infratentorial origin yes no 0 Total:  1   Past Medical History:  Diagnosis Date  . Diabetes mellitus   . Hypertension     No past surgical history on file.  Family History  Problem Relation Age of Onset  . Diabetes Neg Hx    Social History:  reports that he has never smoked. He does not have any smokeless tobacco history on file. He reports current alcohol use. He reports that he does not use drugs.  Allergies: No Known Allergies  Medications:                                                                                                                        I reviewed home medications   ROS:  14 systems reviewed and negative except above   Examination:                                                                                                      General: Appears well-developed  Psych: Affect appropriate to situation Eyes: No scleral injection HENT: No OP obstrucion Head: Normocephalic.  Cardiovascular: Normal rate and regular rhythm.  Respiratory: Effort normal and breath sounds normal to anterior ascultation GI: Soft.  No distension. There is no tenderness.  Skin: WDI    Neurological Examinatio Mental Status: Alert, oriented, thought content appropriate. Slurred peech. Speech fluent without evidence of aphasia. Able to follow 3 step commands without difficulty. Cranial Nerves: II: Visual fields grossly normal,  III,IV, VI: ptosis not present, extra-ocular motions intact bilaterally, pupils equal, round, reactive to light and accommodation V,VII: left facial droop, facial light touch sensation normal bilaterally VIII: hearing normal bilaterally IX,X: uvula rises symmetrically XI: bilateral shoulder shrug XII: midline tongue extension Motor: Right : Upper extremity   4+/5    Left:     Upper extremity   4+/5  Lower extremity   4/5     Lower extremity   3/5 Tone and bulk:normal tone throughout; no atrophy noted Sensory: Pinprick and light touch intact throughout, bilaterally Plantars: Right: downgoing   Left: downgoing Cerebellar: No gross ataxia      Lab Results: Basic Metabolic Panel: Recent Labs  Lab 11/28/19 08-23-30  NA 135  141  K 3.3*  3.3*  CL 97*  100  CO2 22  GLUCOSE 92  91  BUN 21  23  CREATININE 1.36*  1.20  CALCIUM 9.6    CBC: Recent Labs  Lab 11/28/19 2230/08/23  WBC 8.2  NEUTROABS 6.4  HGB 12.5*  13.3  HCT 39.1  39.0  MCV 93.3  PLT 237    Coagulation Studies: Recent Labs    11/28/19 08-23-2230  LABPROT 12.2  INR 0.9    Imaging: CT Angio Head W or Wo Contrast  Result Date: 11/29/2019 CLINICAL DATA:   Left-sided weakness and facial droop EXAM: CT ANGIOGRAPHY HEAD AND NECK TECHNIQUE: Multidetector CT imaging of the head and neck was performed using the standard protocol during bolus administration of intravenous contrast. Multiplanar CT image reconstructions and MIPs were obtained to evaluate the vascular anatomy. Carotid stenosis measurements (when applicable) are obtained utilizing NASCET criteria, using the distal internal carotid diameter as the denominator. CONTRAST:  53mL OMNIPAQUE IOHEXOL 350 MG/ML SOLN COMPARISON:  None. FINDINGS: CT HEAD FINDINGS Brain: There is intraparenchymal hemorrhage along the left caudate tail with extension into the left lateral ventricle and layering in both occipital horns. There is also a small focus of intraparenchymal or subarachnoid hemorrhage at the left posterior vertex (series 3, image 30). There is hypoattenuation of the periventricular white matter, most commonly indicating chronic ischemic microangiopathy. Skull: The visualized skull base, calvarium and extracranial soft tissues are normal. Sinuses/Orbits: No fluid levels or advanced mucosal thickening of the visualized paranasal sinuses. No mastoid or middle ear effusion. The orbits are normal. CTA NECK FINDINGS SKELETON: There is  no bony spinal canal stenosis. No lytic or blastic lesion. OTHER NECK: Normal pharynx, larynx and major salivary glands. No cervical lymphadenopathy. Unremarkable thyroid gland. UPPER CHEST: No pneumothorax or pleural effusion. No nodules or masses. AORTIC ARCH: There is mild calcific atherosclerosis of the aortic arch. There is no aneurysm, dissection or hemodynamically significant stenosis of the visualized portion of the aorta. Conventional 3 vessel aortic branching pattern. The visualized proximal subclavian arteries are widely patent. RIGHT CAROTID SYSTEM: Normal without aneurysm, dissection or stenosis. LEFT CAROTID SYSTEM: Normal without aneurysm, dissection or stenosis. VERTEBRAL  ARTERIES: Left dominant configuration. Both origins are clearly patent. There is no dissection, occlusion or flow-limiting stenosis to the skull base (V1-V3 segments). CTA HEAD FINDINGS POSTERIOR CIRCULATION: --Vertebral arteries: Normal V4 segments. --Inferior cerebellar arteries: Normal. --Basilar artery: Normal. --Superior cerebellar arteries: Normal. --Posterior cerebral arteries (PCA): Normal. ANTERIOR CIRCULATION: --Intracranial internal carotid arteries: Normal. --Anterior cerebral arteries (ACA): Normal. Both A1 segments are present. Patent anterior communicating artery (a-comm). --Middle cerebral arteries (MCA): Normal. VENOUS SINUSES: As permitted by contrast timing, patent. ANATOMIC VARIANTS: None Review of the MIP images confirms the above findings. IMPRESSION: 1. Intraparenchymal hemorrhage along the left caudate tail with extension into the left lateral ventricle and layering in both occipital horns. 2. Small focus of intraparenchymal or subarachnoid hemorrhage at the left posterior vertex and the posterior left parietal lobe. The findings together raise suspicion for hemorrhagic metastatic disease. MRI of the brain with and without contrast is recommended. 3. No intracranial arterial occlusion or high-grade stenosis. 4. Aortic Atherosclerosis (ICD10-I70.0). These results were called by telephone at the time of interpretation on 11/29/2019 at 12:16 am to provider Dr. Clayborne Dana, Who verbally acknowledged these results. Electronically Signed   By: Deatra Robinson M.D.   On: 11/29/2019 00:17   DG Chest 2 View  Result Date: 11/28/2019 CLINICAL DATA:  Shortness of breath EXAM: CHEST - 2 VIEW COMPARISON:  03/26/2015 FINDINGS: Shallow inspiration. Heart size and pulmonary vascularity are normal. Lungs are clear. No pleural effusions. No pneumothorax. Degenerative changes in the spine. Calcification of the aorta. IMPRESSION: No active cardiopulmonary disease. Electronically Signed   By: Burman Nieves M.D.   On:  11/28/2019 23:07   CT Angio Neck W and/or Wo Contrast  Result Date: 11/29/2019 CLINICAL DATA:  Left-sided weakness and facial droop EXAM: CT ANGIOGRAPHY HEAD AND NECK TECHNIQUE: Multidetector CT imaging of the head and neck was performed using the standard protocol during bolus administration of intravenous contrast. Multiplanar CT image reconstructions and MIPs were obtained to evaluate the vascular anatomy. Carotid stenosis measurements (when applicable) are obtained utilizing NASCET criteria, using the distal internal carotid diameter as the denominator. CONTRAST:  74mL OMNIPAQUE IOHEXOL 350 MG/ML SOLN COMPARISON:  None. FINDINGS: CT HEAD FINDINGS Brain: There is intraparenchymal hemorrhage along the left caudate tail with extension into the left lateral ventricle and layering in both occipital horns. There is also a small focus of intraparenchymal or subarachnoid hemorrhage at the left posterior vertex (series 3, image 30). There is hypoattenuation of the periventricular white matter, most commonly indicating chronic ischemic microangiopathy. Skull: The visualized skull base, calvarium and extracranial soft tissues are normal. Sinuses/Orbits: No fluid levels or advanced mucosal thickening of the visualized paranasal sinuses. No mastoid or middle ear effusion. The orbits are normal. CTA NECK FINDINGS SKELETON: There is no bony spinal canal stenosis. No lytic or blastic lesion. OTHER NECK: Normal pharynx, larynx and major salivary glands. No cervical lymphadenopathy. Unremarkable thyroid gland. UPPER CHEST: No pneumothorax or pleural effusion.  No nodules or masses. AORTIC ARCH: There is mild calcific atherosclerosis of the aortic arch. There is no aneurysm, dissection or hemodynamically significant stenosis of the visualized portion of the aorta. Conventional 3 vessel aortic branching pattern. The visualized proximal subclavian arteries are widely patent. RIGHT CAROTID SYSTEM: Normal without aneurysm,  dissection or stenosis. LEFT CAROTID SYSTEM: Normal without aneurysm, dissection or stenosis. VERTEBRAL ARTERIES: Left dominant configuration. Both origins are clearly patent. There is no dissection, occlusion or flow-limiting stenosis to the skull base (V1-V3 segments). CTA HEAD FINDINGS POSTERIOR CIRCULATION: --Vertebral arteries: Normal V4 segments. --Inferior cerebellar arteries: Normal. --Basilar artery: Normal. --Superior cerebellar arteries: Normal. --Posterior cerebral arteries (PCA): Normal. ANTERIOR CIRCULATION: --Intracranial internal carotid arteries: Normal. --Anterior cerebral arteries (ACA): Normal. Both A1 segments are present. Patent anterior communicating artery (a-comm). --Middle cerebral arteries (MCA): Normal. VENOUS SINUSES: As permitted by contrast timing, patent. ANATOMIC VARIANTS: None Review of the MIP images confirms the above findings. IMPRESSION: 1. Intraparenchymal hemorrhage along the left caudate tail with extension into the left lateral ventricle and layering in both occipital horns. 2. Small focus of intraparenchymal or subarachnoid hemorrhage at the left posterior vertex and the posterior left parietal lobe. The findings together raise suspicion for hemorrhagic metastatic disease. MRI of the brain with and without contrast is recommended. 3. No intracranial arterial occlusion or high-grade stenosis. 4. Aortic Atherosclerosis (ICD10-I70.0). These results were called by telephone at the time of interpretation on 11/29/2019 at 12:16 am to provider Dr. Clayborne DanaMESNER, Who verbally acknowledged these results. Electronically Signed   By: Deatra RobinsonKevin  Herman M.D.   On: 11/29/2019 00:17     ASSESSMENT AND PLAN   74 y.o. male with past medical history significant for hypertension, diabetes mellitus presents to the emergency department after daughter noticed slurred speech and having facial droop. Noted to have left NG hemorrhage with IVH as well as SAH/IPH in parietal cortex.   Etiology is likely  from trauma, less likely mets. BP not significantly elevated. CTA negative for aneurysms/AVM.   Recommendations BP less than 160 SBP, PRN labetalol ordered  MRI brain w/wo contrast No antiplatelet therapy/AC Frequent neurochecks Swallow evaluation and PT/OT   This patient is neurologically critically ill due to ICH.  He is at risk for significant risk of neurological worsening from cerebral edema,  death from brain herniation, heart failure, , infection, respiratory failure and seizure. This patient's care requires constant monitoring of vital signs, hemodynamics, respiratory and cardiac monitoring, review of multiple databases, neurological assessment, discussion with family, other specialists and medical decision making of high complexity.  I spent 45  minutes of neurocritical time in the care of this patient.     Kalin Kyler [facility] Pager Number [phone]

## 2019-11-29 NOTE — ED Notes (Signed)
CBG 62 upon arrival, did not cross over.

## 2019-11-29 NOTE — ED Notes (Signed)
Dr. Sethi at bedside. 

## 2019-11-29 NOTE — Progress Notes (Signed)
  Echocardiogram 2D Echocardiogram with defintiy has been performed.  Leta Jungling M 11/29/2019, 1:14 PM

## 2019-11-29 NOTE — ED Notes (Signed)
Report attempted x1, 4N unable to take at this time

## 2019-11-29 NOTE — ED Notes (Signed)
Pt to MRI at this time.

## 2019-11-30 DIAGNOSIS — D62 Acute posthemorrhagic anemia: Secondary | ICD-10-CM

## 2019-11-30 DIAGNOSIS — I10 Essential (primary) hypertension: Secondary | ICD-10-CM

## 2019-11-30 DIAGNOSIS — L8990 Pressure ulcer of unspecified site, unspecified stage: Secondary | ICD-10-CM | POA: Insufficient documentation

## 2019-11-30 DIAGNOSIS — E876 Hypokalemia: Secondary | ICD-10-CM

## 2019-11-30 DIAGNOSIS — R739 Hyperglycemia, unspecified: Secondary | ICD-10-CM

## 2019-11-30 LAB — RAPID URINE DRUG SCREEN, HOSP PERFORMED
Amphetamines: NOT DETECTED
Barbiturates: NOT DETECTED
Benzodiazepines: NOT DETECTED
Cocaine: NOT DETECTED
Opiates: NOT DETECTED
Tetrahydrocannabinol: NOT DETECTED

## 2019-11-30 LAB — GLUCOSE, CAPILLARY
Glucose-Capillary: 199 mg/dL — ABNORMAL HIGH (ref 70–99)
Glucose-Capillary: 186 mg/dL — ABNORMAL HIGH (ref 70–99)

## 2019-11-30 MED ORDER — LISINOPRIL 20 MG PO TABS
40.0000 mg | ORAL_TABLET | Freq: Every day | ORAL | Status: DC
  Administered 2019-11-30 – 2019-12-04 (×5): 40 mg via ORAL
  Filled 2019-11-30 (×5): qty 2

## 2019-11-30 MED ORDER — AMLODIPINE BESYLATE 10 MG PO TABS
10.0000 mg | ORAL_TABLET | Freq: Every day | ORAL | Status: DC
  Administered 2019-11-30 – 2019-12-04 (×5): 10 mg via ORAL
  Filled 2019-11-30 (×5): qty 1

## 2019-11-30 MED ORDER — CLEVIDIPINE BUTYRATE 0.5 MG/ML IV EMUL: 0.0000 mg/h | INTRAVENOUS | Status: DC

## 2019-11-30 MED ORDER — INSULIN DETEMIR 100 UNIT/ML ~~LOC~~ SOLN
15.0000 [IU] | Freq: Two times a day (BID) | SUBCUTANEOUS | Status: DC
  Administered 2019-11-30 – 2019-12-04 (×9): 15 [IU] via SUBCUTANEOUS
  Filled 2019-11-30 (×10): qty 0.15

## 2019-11-30 MED ORDER — METOPROLOL SUCCINATE ER 50 MG PO TB24
50.0000 mg | ORAL_TABLET | Freq: Every day | ORAL | Status: DC
  Administered 2019-11-30 – 2019-12-04 (×5): 50 mg via ORAL
  Filled 2019-11-30 (×5): qty 1

## 2019-11-30 MED ORDER — LABETALOL HCL 5 MG/ML IV SOLN: 20.0000 mg | INTRAVENOUS | Status: DC | PRN

## 2019-11-30 MED ORDER — POTASSIUM CHLORIDE CRYS ER 20 MEQ PO TBCR
20.0000 meq | EXTENDED_RELEASE_TABLET | ORAL | Status: AC
  Administered 2019-11-30 (×2): 20 meq via ORAL
  Filled 2019-11-30 (×2): qty 1

## 2019-11-30 MED ORDER — DM-GUAIFENESIN ER 30-600 MG PO TB12
1.0000 | ORAL_TABLET | Freq: Once | ORAL | Status: AC
  Administered 2019-11-30: 1 via ORAL
  Filled 2019-11-30: qty 1

## 2019-11-30 MED ORDER — PANTOPRAZOLE SODIUM 40 MG PO TBEC
40.0000 mg | DELAYED_RELEASE_TABLET | Freq: Every day | ORAL | Status: DC
  Administered 2019-11-30 – 2019-12-03 (×4): 40 mg via ORAL
  Filled 2019-11-30 (×4): qty 1

## 2019-11-30 MED ORDER — ALLOPURINOL 100 MG PO TABS
300.0000 mg | ORAL_TABLET | Freq: Every day | ORAL | Status: DC
  Administered 2019-11-30 – 2019-12-04 (×5): 300 mg via ORAL
  Filled 2019-11-30 (×3): qty 3
  Filled 2019-11-30: qty 1
  Filled 2019-11-30: qty 3

## 2019-11-30 NOTE — Progress Notes (Signed)
STROKE TEAM PROGRESS NOTE   INTERVAL HISTORY Patient is sitting up in bed.  His blood pressure is well controlled and he is off Cleviprex drip.  His family members are at the bedside.  Neurological exam stable.  Vital signs stable.  His left leg and hip pain appear a lot improved today.  X-rays yesterday did not show significant acute fracture or bony pathology.  Vitals:   11/30/19 0840 11/30/19 0855 11/30/19 0900 11/30/19 0941  BP: 136/61 136/67 130/80 131/64  Pulse: 87 93 91 90  Resp: 16 19 17    Temp:      TempSrc:      SpO2: 96% 96% 96%   Weight:      Height:       CBC:  Recent Labs  Lab 11/28/19 2232  WBC 8.2  NEUTROABS 6.4  HGB 12.5*  13.3  HCT 39.1  39.0  MCV 93.3  PLT 237   Basic Metabolic Panel:  Recent Labs  Lab 11/28/19 2232  NA 135  141  K 3.3*  3.3*  CL 97*  100  CO2 22  GLUCOSE 92  91  BUN 21  23  CREATININE 1.36*  1.20  CALCIUM 9.6   Lipid Panel:  Recent Labs  Lab 11/28/19 2232  CHOL 157  TRIG 66  HDL 54  CHOLHDL 2.9  VLDL 13  LDLCALC 90   HgbA1c:  Recent Labs  Lab 11/28/19 2232  HGBA1C 13.3*   Urine Drug Screen: No results for input(s): LABOPIA, COCAINSCRNUR, LABBENZ, AMPHETMU, THCU, LABBARB in the last 168 hours.  Alcohol Level  Recent Labs  Lab 11/28/19 2232  ETH <10    IMAGING past 24 hours DG Knee 1-2 Views Left  Result Date: 11/29/2019 CLINICAL DATA:  Fall with left knee pain. EXAM: LEFT KNEE - 1-2 VIEW COMPARISON:  None. FINDINGS: No evidence of fracture, dislocation, or definite joint effusion. IMPRESSION: Negative for fracture. Electronically Signed   By: 01/29/2020 M.D.   On: 11/29/2019 11:06   ECHOCARDIOGRAM COMPLETE  Result Date: 11/29/2019    ECHOCARDIOGRAM REPORT   Patient Name:   CAMAURI CRATON Date of Exam: 11/29/2019 Medical Rec #:  01/29/[id number]      Height:       66.0 in Accession #:    [id number]     Weight:       205.0 lb Date of Birth:  02-29-1946      BSA:          2.021 m Patient Age:    74 years        BP:           143/77 mmHg Patient Gender: M              HR:           80 bpm. Exam Location:  Inpatient Procedure: 2D Echo and Intracardiac Opacification Agent Indications:    Stroke 434.91 / I163.9  History:        Patient has no prior history of Echocardiogram examinations.                 Risk Factors:Hypertension and Diabetes.  Sonographer:    08/19/1945 RDCS Referring Phys: [id number] Jazlyn Tippens S Modelle Vollmer IMPRESSIONS  1. Hypokinesis of the basal inferolateral wall; overall normal LV function; grade 1 diastolic dysfunction; moderate AS (mean gradient 24 mmHg); mild LAE.  2. Left ventricular ejection fraction, by estimation, is 55 to 60%. The left ventricle has normal function. The left ventricle  demonstrates regional wall motion abnormalities (see scoring diagram/findings for description). Left ventricular diastolic parameters are consistent with Grade I diastolic dysfunction (impaired relaxation).  3. Right ventricular systolic function is normal. The right ventricular size is normal.  4. Left atrial size was mildly dilated.  5. The mitral valve is normal in structure. Trivial mitral valve regurgitation. No evidence of mitral stenosis.  6. The aortic valve has an indeterminant number of cusps. Aortic valve regurgitation is not visualized. Moderate aortic valve stenosis. FINDINGS  Left Ventricle: Left ventricular ejection fraction, by estimation, is 55 to 60%. The left ventricle has normal function. The left ventricle demonstrates regional wall motion abnormalities. Definity contrast agent was given IV to delineate the left ventricular endocardial borders. The left ventricular internal cavity size was normal in size. There is no left ventricular hypertrophy. Left ventricular diastolic parameters are consistent with Grade I diastolic dysfunction (impaired relaxation). Right Ventricle: The right ventricular size is normal.Right ventricular systolic function is normal. Left Atrium: Left atrial size was mildly dilated.  Right Atrium: Right atrial size was normal in size. Pericardium: There is no evidence of pericardial effusion. Mitral Valve: The mitral valve is normal in structure. Normal mobility of the mitral valve leaflets. Trivial mitral valve regurgitation. No evidence of mitral valve stenosis. Tricuspid Valve: The tricuspid valve is normal in structure. Tricuspid valve regurgitation is trivial. No evidence of tricuspid stenosis. Aortic Valve: The aortic valve has an indeterminant number of cusps. Aortic valve regurgitation is not visualized. Moderate aortic stenosis is present. Aortic valve mean gradient measures 23.7 mmHg. Aortic valve peak gradient measures 45.3 mmHg. Aortic valve area, by VTI measures 0.86 cm. Pulmonic Valve: The pulmonic valve was normal in structure. Pulmonic valve regurgitation is not visualized. No evidence of pulmonic stenosis. Aorta: The aortic root is normal in size and structure. Venous: The inferior vena cava was not well visualized.  Additional Comments: Hypokinesis of the basal inferolateral wall; overall normal LV function; grade 1 diastolic dysfunction; moderate AS (mean gradient 24 mmHg); mild LAE.  LEFT VENTRICLE PLAX 2D LVIDd:         5.00 cm  Diastology LVIDs:         3.30 cm  LV e' lateral:   6.42 cm/s LV PW:         1.10 cm  LV E/e' lateral: 10.8 LV IVS:        1.10 cm  LV e' medial:    5.33 cm/s LVOT diam:     1.80 cm  LV E/e' medial:  13.0 LV SV:         62 LV SV Index:   31 LVOT Area:     2.54 cm  RIGHT VENTRICLE RV S prime:     15.70 cm/s TAPSE (M-mode): 2.0 cm LEFT ATRIUM             Index       RIGHT ATRIUM           Index LA diam:        4.00 cm 1.98 cm/m  RA Area:     14.20 cm LA Vol (A2C):   48.8 ml 24.15 ml/m RA Volume:   35.60 ml  17.62 ml/m LA Vol (A4C):   69.9 ml 34.59 ml/m LA Biplane Vol: 63.3 ml 31.32 ml/m  AORTIC VALVE AV Area (Vmax):    0.84 cm AV Area (Vmean):   1.00 cm AV Area (VTI):     0.86 cm AV Vmax:  336.40 cm/s AV Vmean:          205.800 cm/s  AV VTI:            0.726 m AV Peak Grad:      45.3 mmHg AV Mean Grad:      23.7 mmHg LVOT Vmax:         111.00 cm/s LVOT Vmean:        80.700 cm/s LVOT VTI:          0.245 m LVOT/AV VTI ratio: 0.34  AORTA Ao Root diam: 3.00 cm MITRAL VALVE MV Area (PHT): 2.62 cm    SHUNTS MV Decel Time: 289 msec    Systemic VTI:  0.24 m MV E velocity: 69.40 cm/s  Systemic Diam: 1.80 cm MV A velocity: 66.80 cm/s MV E/A ratio:  1.04 Olga Millers MD Electronically signed by Olga Millers MD Signature Date/Time: 11/29/2019/1:22:46 PM    Final    DG HIP PORT UNILAT WITH PELVIS 1V LEFT  Result Date: 11/29/2019 CLINICAL DATA:  Fall. EXAM: DG HIP (WITH OR WITHOUT PELVIS) 1V PORT LEFT COMPARISON:  CT 05/03/2011. FINDINGS: Contrast noted in the bladder from prior CT. Mild degenerative change lumbar spine and both hips. Well-circumscribed bony densities noted adjacent to the right ischium and right greater trochanter most likely from old injury. No acute bony or joint abnormality identified. No evidence of fracture or dislocation. IMPRESSION: Mild degenerative changes lumbar spine and both hips. Well-circumscribed bony densities noted adjacent to the right ischium and right greater trochanter most likely related to old injury. No acute abnormality identified. Electronically Signed   By: Maisie Fus  Register   On: 11/29/2019 11:08    PHYSICAL EXAM   Pleasant elderly African-American male not in distress. . Afebrile. Head is nontraumatic. Neck is supple without bruit.    Cardiac exam no murmur or gallop. Lungs are clear to auscultation. Distal pulses are well felt. Neurological Exam ;  Awake and alert oriented x 3.  Mildly dysarthric speech.  No aphasia.[place] movements full without nystagmus.fundi were not visualized. Vision acuity and fields appear normal. Hearing is normal. Palatal movements are normal.  Mild right lower face asymmetry when he smiles.  Tongue midline. Normal strength, tone, reflexes and coordination  . Normal sensation.  Gait deferred.   ASSESSMENT/PLAN Manuel Chapman is a 74 y.o. male with history of hypertension, diabetes mellitus presents presenting with slurred speech and facial droop. Fell at stroke onset.   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 L posterior vertex and posterior L parietal lobe IPH or SAH. ? Mets. Aortic atherosclerosis.   MRI w/w/o  L frontoparietal, caudate IPH w/ IVH. Punctate superior R temporal lobe infarct. L caudate head restricted diffusion. Severe small vessel disease.  2D Echo EF 55-60%. No source of embolus. LA mildly dilated.   LDL 90  HgbA1c 13.3  VTE prophylaxis - SCDs   UDS pending   No antithrombotic prior to admission, now on No antithrombotic given hemorrhage   Therapy recommendations:  Pending. Ok to be OOB  Disposition:  pending - has a 76yo dtr that lives w/ him who works during the day  Hypertension  Highest BP 160/72, mostly in the 140s   Home meds:  Amlodipine 10,  chlorthalidone 25, lisinopril 40, metoprolol 50 xl . Stable  on cleviprex gtt . Increase SBP goal to < 160   . Resume amlodpine 10, lisinopril 40 and metoprolol 50 . Prn labetolol . Wean Cleviprex . Long-term BP goal normotensive  Hyperlipidemia  Home meds:  lipitor 40  Hold statin in setting of acute hemorrhage  LDL 90  Resume statin at discharge  Diabetes type II   Home meds:  Glimepiride 2, NPH 65u bid, metformin 1000 bid, actos 45  HgbA1c 13.3, goal < 7.0  CBGs Recent Labs    11/29/19 1735 11/29/19 2015 11/30/19 0749  GLUCAP 202* 187* 199*      DB RN following.  increase levemir to 15 bid as taking POs now  SSI sensitive q4h  Dysphagia, resolved  Other Stroke Risk Factors  Advanced age  ETOH use,  alcohol level <10, advised to drink no more than 2 drink(s) a day  Obesity, Body mass index is 36.51 kg/m., recommend weight loss, diet and exercise as appropriate   Other Active Problems  Pain L knee and hip. Check XR knee neg. XR L hip w/ degenerative changes LS and both hips, no acute abnormality   Hypokalemia 3.3 - replaced. Recheck in am  AKI Cre 12.->1.36  Hospital day # 1 Continue close neurological monitoring and blood pressure control with systolic goal below 160.  Mobilize out of bed.  Therapy consults.  Rehab consults.  Transfer to neurology floor bed when available.  Long discussion with the patient and family members at the bedside and answered questions.  Greater than 50% time during this 35-minute visit was spent in counseling and coordination of care about his strokes and intracerebral hemorrhage and answering questions. Delia HeadyPramod Delphine Sizemore, MD  To contact Stroke Continuity provider, please refer to [email or web address]. After hours, contact General Neurology

## 2019-11-30 NOTE — Evaluation (Signed)
Occupational Therapy Evaluation Patient Details Name: Manuel Chapman MRN: [id number] DOB: 07-11-1945 Today's Date: 11/30/2019    History of Present Illness 74 y.o. male with past medical history significant for hypertension, diabetes mellitus presents to the emergency department after daughter noticed slurred speech and having facial droop after experiencing a fall. 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   Clinical Impression   Pt admitted with above. He demonstrates the below listed deficits and will benefit from continued OT to maximize safety and independence with BADLs.  Pt presents to OT with Lt hemiparesis, generalized weakness, impaired cognition, impaired balance.   He currently requires set up to max A for ADLs and mod A +2 for functional transfers.  PTA, he lived with family and was independent with ADLs.  Recommend CIR level rehab.  Will follow acutely.       Follow Up Recommendations  CIR;Supervision/Assistance - 24 hour    Equipment Recommendations  None recommended by OT    Recommendations for Other Services Rehab consult     Precautions / Restrictions Precautions Precautions: Fall Restrictions Weight Bearing Restrictions: No      Mobility Bed Mobility Overal bed mobility: Needs Assistance Bed Mobility: Supine to Sit     Supine to sit: Mod assist     General bed mobility comments: Pt sitting EOB with PT   Transfers Overall transfer level: Needs assistance Equipment used: 1 person hand held assist;2 person hand held assist Transfers: Sit to/from [employer] Sit to Stand: Mod assist Stand pivot transfers: Mod assist;+2 physical assistance       General transfer comment: assist to move into standing and assist to steady     Balance Overall balance assessment: Needs assistance Sitting-balance  support: Feet supported;Bilateral upper extremity supported Sitting balance-Leahy Scale: Poor Sitting balance - Comments: reliant on UE support, minG   Standing balance support: Bilateral upper extremity supported Standing balance-Leahy Scale: Poor Standing balance comment: modA to maintain static standing                           ADL either performed or assessed with clinical judgement   ADL Overall ADL's : Needs assistance/impaired Eating/Feeding: Set up;Supervision/ safety;Sitting   Grooming: Wash/dry hands;Wash/dry face;Oral care;Brushing hair;Set up;Supervision/safety;Sitting   Upper Body Bathing: Moderate assistance;Sitting   Lower Body Bathing: Moderate assistance;Sit to/from stand   Upper Body Dressing : Moderate assistance;Sitting   Lower Body Dressing: Maximal assistance;Sit to/from stand   Toilet Transfer: Moderate assistance;+2 for physical assistance;+2 for safety/equipment;Stand-pivot;BSC   Toileting- Clothing Manipulation and Hygiene: Moderate assistance;Sit to/from stand       Functional mobility during ADLs: Moderate assistance;+2 for physical assistance;+2 for safety/equipment       Vision Baseline Vision/History: Wears glasses Wears Glasses: At all times Patient Visual Report: No change from baseline Vision Assessment?: Yes Eye Alignment: Within Functional Limits Ocular Range of Motion: Within Functional Limits Alignment/Gaze Preference: Within Defined Limits Tracking/Visual Pursuits: Other (comment) Visual Fields: No apparent deficits Additional Comments: Pt frequently looses fixation when performing pursuits, but does demonstrate full EOMs.  Fields appear grossly intact as pt demonstrates difficulty fully attending to task      Perception Perception Comments: to be further assessed    Praxis Praxis Praxis tested?: Deficits Deficits: Organization    Pertinent Vitals/Pain Pain Assessment: No/denies pain     Hand Dominance  Right  Extremity/Trunk Assessment Upper Extremity Assessment Upper Extremity Assessment: LUE deficits/detail LUE Deficits / Details: Pt demonstrates ~40* shoulder flexion.  When asked to lift it higher, he states he can't, but is vague if this is new weakness or premorbid weakness.  Later in session, he indicated that shoulder weakness is new since stroke  LUE Coordination: decreased gross motor;decreased fine motor   Lower Extremity Assessment Lower Extremity Assessment: Defer to PT evaluation   Cervical / Trunk Assessment Cervical / Trunk Assessment: Kyphotic   Communication Communication Communication: Expressive difficulties (dysarthric, slowed speech)   Cognition Arousal/Alertness: Awake/alert Behavior During Therapy: WFL for tasks assessed/performed Overall Cognitive Status: Impaired/Different from baseline Area of Impairment: Orientation;Memory;Following commands;Safety/judgement;Awareness;Problem solving                 Orientation Level: Disoriented to;Time;Place (initially states [place])   Memory: Decreased short-term memory Following Commands: Follows one step commands consistently;Follows one step commands with increased time Safety/Judgement: Decreased awareness of safety;Decreased awareness of deficits Awareness: Intellectual Problem Solving: Slow processing     General Comments  VSS.  Daughter present during eval     Exercises     Shoulder Instructions      Home Living Family/patient expects to be discharged to:: Private residence Living Arrangements: Children;Other relatives Available Help at Discharge: Family Type of Home: House Home Access: Stairs to enter [employer] of Steps: 1 Entrance Stairs-Rails: None Home Layout: One level     Bathroom Shower/Tub: Chief Strategy Officer: Standard     Home Equipment: Cane - single point;Adaptive equipment Adaptive Equipment: Sock aid    Lives With: Daughter;Other  (Comment) (cousin)    Prior Functioning/Environment Level of Independence: Independent with assistive device(s)        Comments: pt ambulates with cane in the community.  He reports he uses a sock aid to don socks.  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         OT Problem List: Decreased strength;Decreased range of motion;Decreased activity tolerance;Impaired balance (sitting and/or standing);Impaired vision/perception;Decreased coordination;Decreased cognition;Decreased safety awareness;Decreased knowledge of use of DME or AE;Impaired UE functional use      OT Treatment/Interventions: Self-care/ADL training;DME and/or AE instruction;Therapeutic activities;Cognitive remediation/compensation;Visual/perceptual remediation/compensation;Patient/family education;Balance training;Neuromuscular education    OT Goals(Current goals can be found in the care plan section) Acute Rehab OT Goals Patient Stated Goal: To be able to take care of self  OT Goal Formulation: With patient/family Time For Goal Achievement: 12/14/19 Potential to Achieve Goals: Good ADL Goals Pt Will Perform Grooming: with min assist;standing Pt Will Perform Upper Body Bathing: with set-up;with supervision;sitting Pt Will Perform Lower Body Bathing: with min assist;sit to/from stand Pt Will Perform Upper Body Dressing: with set-up;with supervision;sitting Pt Will Perform Lower Body Dressing: with min assist;with adaptive equipment;sit to/from stand Pt Will Transfer to Toilet: with min assist;ambulating;regular height toilet;bedside commode;grab bars Pt Will Perform Toileting - Clothing Manipulation and hygiene: with min assist;sit to/from stand Additional ADL Goal #1: Pt will demonstrate selective attention while performing simple ADL tasks and min cues  OT Frequency: Min 2X/week   Barriers to D/C:            Co-evaluation PT/OT/SLP Co-Evaluation/Treatment: Yes Reason for Co-Treatment: For  patient/therapist safety;To address functional/ADL transfers   OT goals addressed during session: ADL's and self-care;Strengthening/ROM      AM-PAC OT "6 Clicks" Daily Activity     Outcome Measure Help from another person eating meals?: A Little Help from another person taking  care of personal grooming?: A Little Help from another person toileting, which includes using toliet, bedpan, or urinal?: A Lot Help from another person bathing (including washing, rinsing, drying)?: A Lot Help from another person to put on and taking off regular upper body clothing?: A Lot Help from another person to put on and taking off regular lower body clothing?: A Lot 6 Click Score: 14   End of Session Nurse Communication: Mobility status  Activity Tolerance: Patient tolerated treatment well Patient left: in chair;with call bell/phone within reach;with chair alarm set;with family/visitor present  OT Visit Diagnosis: Unsteadiness on feet (R26.81);Cognitive communication deficit (R41.841) Symptoms and signs involving cognitive functions: Cerebral infarction                Time: 8469-6295 OT Time Calculation (min): 13 min Charges:  OT General Charges $OT Visit: 1 Visit OT Evaluation $OT Eval Moderate Complexity: 1 Mod  Eber Jones., OTR/L Acute Rehabilitation Services Pager [phone] Office [phone]   Jeani Hawking M 11/30/2019, 1:17 PM

## 2019-11-30 NOTE — Plan of Care (Signed)

## 2019-11-30 NOTE — Progress Notes (Signed)
Inpatient Rehab Admissions Coordinator Note:   Per therapy recommendations, pt was screened for CIR candidacy by Estill Dooms, PT, DPT.  At this time we are recommending a CIR consult and I will place an order per our protocol.  Please contact me with questions.   Estill Dooms, PT, DPT [phone] 11/30/19 11:38 AM

## 2019-11-30 NOTE — Evaluation (Signed)
Physical Therapy Evaluation Patient Details Name: Manuel Chapman MRN: [id number] DOB: 1945-12-28 Today's Date: 11/30/2019   History of Present Illness  74 y.o. male with past medical history significant for hypertension, diabetes mellitus presents to the emergency department after daughter noticed slurred speech and having facial droop after experiencing a fall. 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  Clinical Impression  Pt presents to PT with deficits in functional mobility, cognition, gait, balance, strength, power, cognition, safety awareness and awareness of deficits. Pt with generalized LE weakness and more significant LUE weakness at this time, requiring physical assistance for all functional mobility to prevent a fall. Pt with slowed processing and reduced awareness of deficits, exacerbating his falls risk. Pt will benefit from continued acute PT POC and aggressive mobilization to improve mobility quality and restore independence. PT recommends CIR at this time as the pt was modI prior to admission and demonstrate the potential to return to a supervision level of mobility with high intensity inpatient PT services.    Follow Up Recommendations CIR    Equipment Recommendations  Wheelchair (measurements PT);Wheelchair cushion (measurements PT);Rolling walker with 5" wheels;Hospital bed    Recommendations for Other Services Rehab consult     Precautions / Restrictions Precautions Precautions: Fall Restrictions Weight Bearing Restrictions: No      Mobility  Bed Mobility Overal bed mobility: Needs Assistance Bed Mobility: Supine to Sit     Supine to sit: Mod assist        Transfers Overall transfer level: Needs assistance Equipment used: 1 person hand held assist;2 person hand held assist Transfers: Sit to/from [employer]  Transfers Sit to Stand: Mod assist Stand pivot transfers: Mod assist;+2 physical assistance          Ambulation/Gait Ambulation/Gait assistance: Mod assist;+2 physical assistance Gait Distance (Feet): 2 Feet Assistive device: 2 person hand held assist Gait Pattern/deviations: Shuffle Gait velocity: reduced Gait velocity interpretation: <1.31 ft/sec, indicative of household ambulator General Gait Details: pt with widened BOS, short shuffling steps with reduce foot clearance  Stairs            Wheelchair Mobility    Modified Rankin (Stroke Patients Only)       Balance Overall balance assessment: Needs assistance Sitting-balance support: Feet supported;Bilateral upper extremity supported Sitting balance-Leahy Scale: Poor Sitting balance - Comments: reliant on UE support, minG   Standing balance support: Bilateral upper extremity supported Standing balance-Leahy Scale: Poor Standing balance comment: modA to maintain static standing                             Pertinent Vitals/Pain Pain Assessment: No/denies pain    Home Living Family/patient expects to be discharged to:: Private residence Living Arrangements: Children;Other relatives Available Help at Discharge: Family Type of Home: House Home Access: Stairs to enter Entrance Stairs-Rails: None Entrance Stairs-Number of Steps: 1 Home Layout: One level Home Equipment: Cane - single point      Prior Function Level of Independence: Independent with assistive device(s)         Comments: pt ambulates with cane in the community     Hand Dominance        Extremity/Trunk Assessment   Upper Extremity Assessment Upper Extremity Assessment: Defer to OT evaluation (impaired LUE coordination vs strength)    Lower Extremity Assessment Lower Extremity Assessment: Generalized weakness  Cervical / Trunk Assessment Cervical / Trunk Assessment: Kyphotic  Communication   Communication:  Expressive difficulties (dysarthric, slowed speech)  Cognition Arousal/Alertness: Awake/alert Behavior During Therapy: WFL for tasks assessed/performed Overall Cognitive Status: Impaired/Different from baseline Area of Impairment: Orientation;Memory;Following commands;Safety/judgement;Awareness;Problem solving                 Orientation Level: Disoriented to;Time;Place (initially states [place])     Following Commands: Follows one step commands consistently Safety/Judgement: Decreased awareness of safety;Decreased awareness of deficits Awareness: Intellectual Problem Solving: Slow processing        General Comments General comments (skin integrity, edema, etc.): VSS on RA    Exercises     Assessment/Plan    PT Assessment Patient needs continued PT services  PT Problem List Decreased strength;Decreased activity tolerance;Decreased balance;Decreased mobility;Decreased coordination;Decreased cognition;Decreased knowledge of use of DME;Decreased safety awareness;Decreased knowledge of precautions       PT Treatment Interventions DME instruction;Gait training;Stair training;Functional mobility training;Therapeutic activities;Therapeutic exercise;Balance training;Neuromuscular re-education;Cognitive remediation;Patient/family education    PT Goals (Current goals can be found in the Care Plan section)  Acute Rehab PT Goals Patient Stated Goal: To return to prior level of function PT Goal Formulation: With patient Time For Goal Achievement: 12/14/19 Potential to Achieve Goals: Good    Frequency Min 4X/week   Barriers to discharge        Co-evaluation               AM-PAC PT "6 Clicks" Mobility  Outcome Measure Help needed turning from your back to your side while in a flat bed without using bedrails?: A Lot Help needed moving from lying on your back to sitting on the side of a flat bed without using bedrails?: A Lot Help needed moving to and from a bed to a  chair (including a wheelchair)?: A Lot Help needed standing up from a chair using your arms (e.g., wheelchair or bedside chair)?: A Lot Help needed to walk in hospital room?: A Lot Help needed climbing 3-5 steps with a railing? : Total 6 Click Score: 11    End of Session   Activity Tolerance: Patient tolerated treatment well Patient left: in chair;with call bell/phone within reach;with chair alarm set;with family/visitor present Nurse Communication: Mobility status PT Visit Diagnosis: Other abnormalities of gait and mobility (R26.89);Muscle weakness (generalized) (M62.81);Other symptoms and signs involving the nervous system (R29.898)    Time: 1025-8527 PT Time Calculation (min) (ACUTE ONLY): 21 min   Charges:   PT Evaluation $PT Eval Moderate Complexity: 1 Mod          Arlyss Gandy, PT, DPT Acute Rehabilitation Pager: [phone]   Arlyss Gandy 11/30/2019, 11:13 AM

## 2019-11-30 NOTE — Consult Note (Signed)
Physical Medicine and Rehabilitation Consult   Reason for Consult: Functional deficits due to ICH Referring Physician: Dr. Pearlean Brownie   HPI: Quenten Nawaz is a 74 y.o. male with history of HTN, T2DM who was admitted on 11/29/2019 with facial droop and dysarthria.  History taken from chart review due to mentation.  Daughter reported fall earlier that day with elevated BS, however patient refused EMS transport to hospital. CTA/ perfusion of head/neck showed IPH without LVO/occlusion and follow up MRI brain, personally reviewed, showing left brain hemorrhage.  Per report, left frontal,parietal, caudate and IVH without progression and punctate infarct in superior right temporal lobe. He was started on Cleviprex to keep SBP< 140.  Echocardiogram with ejection fraction of 55-60% with hypokinesis of basal wall, moderate AS and mild LAE. Dr. Pearlean Brownie felt that patient with ischemic infarcts with hemorrhagic transformation.  Hospital course further complicated by hyperglycemia, hypokalemia, acute blood loss anemia.  UDS negative.  Patient with mild dysarthria with cognitive deficits, delayed processing with decreased awareness of deficit, BLE weakness as well as LUE weakness affecting ADLs and mobility. CIR recommended due to functional deficits.    Review of Systems  Unable to perform ROS: Mental acuity    Past Medical History:  Diagnosis Date  . Diabetes mellitus   . Hypertension    History reviewed. No pertinent surgical history.,  Unable to obtain from patient due to mentation  Family History  Problem Relation Age of Onset  . Diabetes Neg Hx     Social History:  Widowed--independent with cane. Retired Psychologist, occupational. Lives with [facility] home and daughter drives when needed. He reports that he has never smoked. He has never used smokeless tobacco. He denies  current alcohol use.  He reports that he does not use drugs.    Allergies: No Known Allergies    Medications Prior to Admission    Medication Sig Dispense Refill  . acetaminophen (TYLENOL) 325 MG tablet Take 650 mg by mouth every 6 (six) hours as needed for mild pain or moderate pain.    Marland Kitchen allopurinol (ZYLOPRIM) 300 MG tablet Take 300 mg by mouth daily.    Marland Kitchen amLODipine (NORVASC) 10 MG tablet Take 10 mg by mouth daily.      Marland Kitchen atorvastatin (LIPITOR) 40 MG tablet Take 40 mg by mouth daily.      . chlorthalidone (HYGROTON) 25 MG tablet Take 25 mg by mouth daily.      Marland Kitchen glimepiride (AMARYL) 2 MG tablet Take 2 mg by mouth daily.    . Insulin NPH Human, Isophane, (NOVOLIN [place]) Inject 65 Units into the skin 2 (two) times daily.    Marland Kitchen levofloxacin (LEVAQUIN) 750 MG tablet Take 750 mg by mouth daily.     Marland Kitchen lisinopril (PRINIVIL,ZESTRIL) 40 MG tablet Take 40 mg by mouth daily.      . metFORMIN (GLUCOPHAGE) 1000 MG tablet Take 1,000 mg by mouth 2 (two) times daily with a meal.      . metoprolol (TOPROL-XL) 50 MG 24 hr tablet Take 50 mg by mouth daily.      . pioglitazone (ACTOS) 45 MG tablet Take 45 mg by mouth daily.      Home: Home Living Family/patient expects to be discharged to:: Private residence Living Arrangements: Children, Other relatives Available Help at Discharge: Family Type of Home: House Home Access: Stairs to enter [employer] of Steps: 1 Entrance Stairs-Rails: None Home Layout: One level Bathroom Shower/Tub: Engineer, manufacturing systems: Standard Home Equipment:  [place] - single point  Lives With: Daughter, Other (Comment) (cousin)  Functional History: Prior Function Level of Independence: Independent with assistive device(s) Comments: pt ambulates with cane in the community Functional Status:  Mobility: Bed Mobility Overal bed mobility: Needs Assistance Bed Mobility: Supine to Sit Supine to sit: Mod assist Transfers Overall transfer level: Needs assistance Equipment used: 1 person hand held assist, 2 person hand held assist Transfers: Sit to/from Stand, Stand Pivot Transfers Sit to  Stand: Mod assist Stand pivot transfers: Mod assist, +2 physical assistance Ambulation/Gait Ambulation/Gait assistance: Mod assist, +2 physical assistance Gait Distance (Feet): 2 Feet Assistive device: 2 person hand held assist Gait Pattern/deviations: Shuffle General Gait Details: pt with widened BOS, short shuffling steps with reduce foot clearance Gait velocity: reduced Gait velocity interpretation: <1.31 ft/sec, indicative of household ambulator    ADL:    Cognition: Cognition Overall Cognitive Status: Impaired/Different from baseline Arousal/Alertness: Awake/alert Orientation Level: Oriented to person, Oriented to place, Disoriented to time, Disoriented to situation Attention: Sustained, Selective Sustained Attention: Impaired Sustained Attention Impairment: Verbal basic Selective Attention: Impaired Selective Attention Impairment: Verbal basic Memory: Impaired Memory Impairment: Storage deficit, Retrieval deficit Awareness: Impaired Awareness Impairment: Emergent impairment Problem Solving: Impaired Problem Solving Impairment: Verbal basic Safety/Judgment: Impaired Cognition Arousal/Alertness: Awake/alert Behavior During Therapy: WFL for tasks assessed/performed Overall Cognitive Status: Impaired/Different from baseline Area of Impairment: Orientation, Memory, Following commands, Safety/judgement, Awareness, Problem solving Orientation Level: Disoriented to, Time, Place (initially states [place]) Following Commands: Follows one step commands consistently Safety/Judgement: Decreased awareness of safety, Decreased awareness of deficits Awareness: Intellectual Problem Solving: Slow processing   Blood pressure 131/64, pulse 90, temperature 100.2 F (37.9 C), temperature source Oral, resp. rate 17, height 5\' 6"  (1.676 m), weight 102.6 kg, SpO2 96 %. Physical Exam Vitals reviewed.  Constitutional:      General: He is not in acute distress.    Appearance: Normal  appearance.     Comments: Somnolent  HENT:     Head: Normocephalic and atraumatic.     Right Ear: External ear normal.     Left Ear: External ear normal.     Nose: Nose normal.  Eyes:     General:        Right eye: No discharge.        Left eye: No discharge.     Comments: Briefly opens eyes  Cardiovascular:     Rate and Rhythm: Normal rate and regular rhythm.  Pulmonary:     Effort: Pulmonary effort is normal. No respiratory distress.     Breath sounds: No stridor.  Abdominal:     General: Abdomen is flat. Bowel sounds are normal. There is no distension.  Musculoskeletal:     Cervical back: Normal range of motion and neck supple.     Comments: No edema or tenderness in extremities  Skin:    General: Skin is warm and dry.  Neurological:     Mental Status: He is easily aroused.     Comments: Left facial weakness Dysarthria Facial tic Motor exam limited, however spontaneously moving all extremities   Psychiatric:     Comments: Due to somnolence Perseverative "in trouble"     Results for orders placed or performed during the hospital encounter of 11/28/19 (from the past 24 hour(s))  CBG monitoring, ED     Status: Abnormal   Collection Time: 11/29/19 12:46 PM  Result Value Ref Range   Glucose-Capillary 212 (H) 70 - 99 mg/dL  MRSA PCR Screening  Status: None   Collection Time: 11/29/19  3:44 PM   Specimen: Nasal Mucosa; Nasopharyngeal  Result Value Ref Range   MRSA by PCR NEGATIVE NEGATIVE  Glucose, capillary     Status: Abnormal   Collection Time: 11/29/19  5:35 PM  Result Value Ref Range   Glucose-Capillary 202 (H) 70 - 99 mg/dL  Glucose, capillary     Status: Abnormal   Collection Time: 11/29/19  8:15 PM  Result Value Ref Range   Glucose-Capillary 187 (H) 70 - 99 mg/dL  Glucose, capillary     Status: Abnormal   Collection Time: 11/30/19  7:49 AM  Result Value Ref Range   Glucose-Capillary 199 (H) 70 - 99 mg/dL   CT Angio Head W or Wo Contrast  Result  Date: 11/29/2019 CLINICAL DATA:  Left-sided weakness and facial droop EXAM: CT ANGIOGRAPHY HEAD AND NECK TECHNIQUE: Multidetector CT imaging of the head and neck was performed using the standard protocol during bolus administration of intravenous contrast. Multiplanar CT image reconstructions and MIPs were obtained to evaluate the vascular anatomy. Carotid stenosis measurements (when applicable) are obtained utilizing NASCET criteria, using the distal internal carotid diameter as the denominator. CONTRAST:  80mL OMNIPAQUE IOHEXOL 350 MG/ML SOLN COMPARISON:  None. FINDINGS: CT HEAD FINDINGS Brain: There is intraparenchymal hemorrhage along the left caudate tail with extension into the left lateral ventricle and layering in both occipital horns. There is also a small focus of intraparenchymal or subarachnoid hemorrhage at the left posterior vertex (series 3, image 30). There is hypoattenuation of the periventricular white matter, most commonly indicating chronic ischemic microangiopathy. Skull: The visualized skull base, calvarium and extracranial soft tissues are normal. Sinuses/Orbits: No fluid levels or advanced mucosal thickening of the visualized paranasal sinuses. No mastoid or middle ear effusion. The orbits are normal. CTA NECK FINDINGS SKELETON: There is no bony spinal canal stenosis. No lytic or blastic lesion. OTHER NECK: Normal pharynx, larynx and major salivary glands. No cervical lymphadenopathy. Unremarkable thyroid gland. UPPER CHEST: No pneumothorax or pleural effusion. No nodules or masses. AORTIC ARCH: There is mild calcific atherosclerosis of the aortic arch. There is no aneurysm, dissection or hemodynamically significant stenosis of the visualized portion of the aorta. Conventional 3 vessel aortic branching pattern. The visualized proximal subclavian arteries are widely patent. RIGHT CAROTID SYSTEM: Normal without aneurysm, dissection or stenosis. LEFT CAROTID SYSTEM: Normal without aneurysm,  dissection or stenosis. VERTEBRAL ARTERIES: Left dominant configuration. Both origins are clearly patent. There is no dissection, occlusion or flow-limiting stenosis to the skull base (V1-V3 segments). CTA HEAD FINDINGS POSTERIOR CIRCULATION: --Vertebral arteries: Normal V4 segments. --Inferior cerebellar arteries: Normal. --Basilar artery: Normal. --Superior cerebellar arteries: Normal. --Posterior cerebral arteries (PCA): Normal. ANTERIOR CIRCULATION: --Intracranial internal carotid arteries: Normal. --Anterior cerebral arteries (ACA): Normal. Both A1 segments are present. Patent anterior communicating artery (a-comm). --Middle cerebral arteries (MCA): Normal. VENOUS SINUSES: As permitted by contrast timing, patent. ANATOMIC VARIANTS: None Review of the MIP images confirms the above findings. IMPRESSION: 1. Intraparenchymal hemorrhage along the left caudate tail with extension into the left lateral ventricle and layering in both occipital horns. 2. Small focus of intraparenchymal or subarachnoid hemorrhage at the left posterior vertex and the posterior left parietal lobe. The findings together raise suspicion for hemorrhagic metastatic disease. MRI of the brain with and without contrast is recommended. 3. No intracranial arterial occlusion or high-grade stenosis. 4. Aortic Atherosclerosis (ICD10-I70.0). These results were called by telephone at the time of interpretation on 11/29/2019 at 12:16 am to provider Dr. Clayborne Dana,  Who verbally acknowledged these results. Electronically Signed   By: Deatra Robinson M.D.   On: 11/29/2019 00:17   DG Chest 2 View  Result Date: 11/28/2019 CLINICAL DATA:  Shortness of breath EXAM: CHEST - 2 VIEW COMPARISON:  03/26/2015 FINDINGS: Shallow inspiration. Heart size and pulmonary vascularity are normal. Lungs are clear. No pleural effusions. No pneumothorax. Degenerative changes in the spine. Calcification of the aorta. IMPRESSION: No active cardiopulmonary disease. Electronically Signed    By: Burman Nieves M.D.   On: 11/28/2019 23:07   DG Knee 1-2 Views Left  Result Date: 11/29/2019 CLINICAL DATA:  Fall with left knee pain. EXAM: LEFT KNEE - 1-2 VIEW COMPARISON:  None. FINDINGS: No evidence of fracture, dislocation, or definite joint effusion. IMPRESSION: Negative for fracture. Electronically Signed   By: Marnee Spring M.D.   On: 11/29/2019 11:06   CT Angio Neck W and/or Wo Contrast  Result Date: 11/29/2019 CLINICAL DATA:  Left-sided weakness and facial droop EXAM: CT ANGIOGRAPHY HEAD AND NECK TECHNIQUE: Multidetector CT imaging of the head and neck was performed using the standard protocol during bolus administration of intravenous contrast. Multiplanar CT image reconstructions and MIPs were obtained to evaluate the vascular anatomy. Carotid stenosis measurements (when applicable) are obtained utilizing NASCET criteria, using the distal internal carotid diameter as the denominator. CONTRAST:  80mL OMNIPAQUE IOHEXOL 350 MG/ML SOLN COMPARISON:  None. FINDINGS: CT HEAD FINDINGS Brain: There is intraparenchymal hemorrhage along the left caudate tail with extension into the left lateral ventricle and layering in both occipital horns. There is also a small focus of intraparenchymal or subarachnoid hemorrhage at the left posterior vertex (series 3, image 30). There is hypoattenuation of the periventricular white matter, most commonly indicating chronic ischemic microangiopathy. Skull: The visualized skull base, calvarium and extracranial soft tissues are normal. Sinuses/Orbits: No fluid levels or advanced mucosal thickening of the visualized paranasal sinuses. No mastoid or middle ear effusion. The orbits are normal. CTA NECK FINDINGS SKELETON: There is no bony spinal canal stenosis. No lytic or blastic lesion. OTHER NECK: Normal pharynx, larynx and major salivary glands. No cervical lymphadenopathy. Unremarkable thyroid gland. UPPER CHEST: No pneumothorax or pleural effusion. No nodules or  masses. AORTIC ARCH: There is mild calcific atherosclerosis of the aortic arch. There is no aneurysm, dissection or hemodynamically significant stenosis of the visualized portion of the aorta. Conventional 3 vessel aortic branching pattern. The visualized proximal subclavian arteries are widely patent. RIGHT CAROTID SYSTEM: Normal without aneurysm, dissection or stenosis. LEFT CAROTID SYSTEM: Normal without aneurysm, dissection or stenosis. VERTEBRAL ARTERIES: Left dominant configuration. Both origins are clearly patent. There is no dissection, occlusion or flow-limiting stenosis to the skull base (V1-V3 segments). CTA HEAD FINDINGS POSTERIOR CIRCULATION: --Vertebral arteries: Normal V4 segments. --Inferior cerebellar arteries: Normal. --Basilar artery: Normal. --Superior cerebellar arteries: Normal. --Posterior cerebral arteries (PCA): Normal. ANTERIOR CIRCULATION: --Intracranial internal carotid arteries: Normal. --Anterior cerebral arteries (ACA): Normal. Both A1 segments are present. Patent anterior communicating artery (a-comm). --Middle cerebral arteries (MCA): Normal. VENOUS SINUSES: As permitted by contrast timing, patent. ANATOMIC VARIANTS: None Review of the MIP images confirms the above findings. IMPRESSION: 1. Intraparenchymal hemorrhage along the left caudate tail with extension into the left lateral ventricle and layering in both occipital horns. 2. Small focus of intraparenchymal or subarachnoid hemorrhage at the left posterior vertex and the posterior left parietal lobe. The findings together raise suspicion for hemorrhagic metastatic disease. MRI of the brain with and without contrast is recommended. 3. No intracranial arterial occlusion or high-grade stenosis.  4. Aortic Atherosclerosis (ICD10-I70.0). These results were called by telephone at the time of interpretation on 11/29/2019 at 12:16 am to provider Dr. Clayborne DanaMESNER, Who verbally acknowledged these results. Electronically Signed   By: Deatra RobinsonKevin  Herman  M.D.   On: 11/29/2019 00:17   MR Brain W and Wo Contrast  Result Date: 11/29/2019 CLINICAL DATA:  Slurred speech and facial droop. EXAM: MRI HEAD WITHOUT AND WITH CONTRAST TECHNIQUE: Multiplanar, multiecho pulse sequences of the brain and surrounding structures were obtained without and with intravenous contrast. CONTRAST:  9mL GADAVIST GADOBUTROL 1 MMOL/ML IV SOLN COMPARISON:  CTA of the head neck from yesterday FINDINGS: Brain: Cluster of small hemorrhages at the left caudate body/tail measuring up to 6 mm individually. There is regional diffusion hyperintensity greater than expected for artifact. Even smaller areas of left frontal parietal convexity hemorrhage. There is a punctate focus of restricted diffusion along the upper right temporal lobe. Small volume intraventricular hemorrhage layering in the occipital horns. No superimposed enhancement. Severe chronic small vessel ischemia with confluent FLAIR hyperintensity in the cerebral white matter and pons. No swelling or masslike finding. Remote micro hemorrhages in the bilateral inferior frontal lobes. Borderline dural thickening that is diffuse. No brain sagging or nodularity. Vascular: Normal flow voids and vascular enhancement Skull and upper cervical spine: Normal marrow signal Sinuses/Orbits: Negative IMPRESSION: 1. Known left frontal parietal, left caudate, and intraventricular hemorrhage without interval progression. 2. Although susceptibility artifact likely contributes, there appears to be true restricted diffusion at the left caudate. There is also a punctate acute infarct at the superior right temporal lobe. Overall, favor sites of hemorrhage are related to underlying infarcts. No masslike enhancement. 3. Severe chronic small vessel ischemia. Electronically Signed   By: Marnee SpringJonathon  Watts M.D.   On: 11/29/2019 05:06   ECHOCARDIOGRAM COMPLETE  Result Date: 11/29/2019    ECHOCARDIOGRAM REPORT   Patient Name:   Ned ClinesROBERT Piet Date of Exam: 11/29/2019  Medical Rec #:  [id number]      Height:       66.0 in Accession #:    [id number]     Weight:       205.0 lb Date of Birth:  03-11-1946      BSA:          2.021 m Patient Age:    74 years       BP:           143/77 mmHg Patient Gender: M              HR:           80 bpm. Exam Location:  Inpatient Procedure: 2D Echo and Intracardiac Opacification Agent Indications:    Stroke 434.91 / I163.9  History:        Patient has no prior history of Echocardiogram examinations.                 Risk Factors:Hypertension and Diabetes.  Sonographer:    Leta Junglingiffany Cooper RDCS Referring Phys: [id number] PRAMOD S SETHI IMPRESSIONS  1. Hypokinesis of the basal inferolateral wall; overall normal LV function; grade 1 diastolic dysfunction; moderate AS (mean gradient 24 mmHg); mild LAE.  2. Left ventricular ejection fraction, by estimation, is 55 to 60%. The left ventricle has normal function. The left ventricle demonstrates regional wall motion abnormalities (see scoring diagram/findings for description). Left ventricular diastolic parameters are consistent with Grade I diastolic dysfunction (impaired relaxation).  3. Right ventricular systolic function is normal. The right ventricular size is normal.  4.  Left atrial size was mildly dilated.  5. The mitral valve is normal in structure. Trivial mitral valve regurgitation. No evidence of mitral stenosis.  6. The aortic valve has an indeterminant number of cusps. Aortic valve regurgitation is not visualized. Moderate aortic valve stenosis. FINDINGS  Left Ventricle: Left ventricular ejection fraction, by estimation, is 55 to 60%. The left ventricle has normal function. The left ventricle demonstrates regional wall motion abnormalities. Definity contrast agent was given IV to delineate the left ventricular endocardial borders. The left ventricular internal cavity size was normal in size. There is no left ventricular hypertrophy. Left ventricular diastolic parameters are consistent with Grade I  diastolic dysfunction (impaired relaxation). Right Ventricle: The right ventricular size is normal.Right ventricular systolic function is normal. Left Atrium: Left atrial size was mildly dilated. Right Atrium: Right atrial size was normal in size. Pericardium: There is no evidence of pericardial effusion. Mitral Valve: The mitral valve is normal in structure. Normal mobility of the mitral valve leaflets. Trivial mitral valve regurgitation. No evidence of mitral valve stenosis. Tricuspid Valve: The tricuspid valve is normal in structure. Tricuspid valve regurgitation is trivial. No evidence of tricuspid stenosis. Aortic Valve: The aortic valve has an indeterminant number of cusps. Aortic valve regurgitation is not visualized. Moderate aortic stenosis is present. Aortic valve mean gradient measures 23.7 mmHg. Aortic valve peak gradient measures 45.3 mmHg. Aortic valve area, by VTI measures 0.86 cm. Pulmonic Valve: The pulmonic valve was normal in structure. Pulmonic valve regurgitation is not visualized. No evidence of pulmonic stenosis. Aorta: The aortic root is normal in size and structure. Venous: The inferior vena cava was not well visualized.  Additional Comments: Hypokinesis of the basal inferolateral wall; overall normal LV function; grade 1 diastolic dysfunction; moderate AS (mean gradient 24 mmHg); mild LAE.  LEFT VENTRICLE PLAX 2D LVIDd:         5.00 cm  Diastology LVIDs:         3.30 cm  LV e' lateral:   6.42 cm/s LV PW:         1.10 cm  LV E/e' lateral: 10.8 LV IVS:        1.10 cm  LV e' medial:    5.33 cm/s LVOT diam:     1.80 cm  LV E/e' medial:  13.0 LV SV:         62 LV SV Index:   31 LVOT Area:     2.54 cm  RIGHT VENTRICLE RV S prime:     15.70 cm/s TAPSE (M-mode): 2.0 cm LEFT ATRIUM             Index       RIGHT ATRIUM           Index LA diam:        4.00 cm 1.98 cm/m  RA Area:     14.20 cm LA Vol (A2C):   48.8 ml 24.15 ml/m RA Volume:   35.60 ml  17.62 ml/m LA Vol (A4C):   69.9 ml 34.59 ml/m  LA Biplane Vol: 63.3 ml 31.32 ml/m  AORTIC VALVE AV Area (Vmax):    0.84 cm AV Area (Vmean):   1.00 cm AV Area (VTI):     0.86 cm AV Vmax:           336.40 cm/s AV Vmean:          205.800 cm/s AV VTI:            0.726 m AV Peak Grad:  45.3 mmHg AV Mean Grad:      23.7 mmHg LVOT Vmax:         111.00 cm/s LVOT Vmean:        80.700 cm/s LVOT VTI:          0.245 m LVOT/AV VTI ratio: 0.34  AORTA Ao Root diam: 3.00 cm MITRAL VALVE MV Area (PHT): 2.62 cm    SHUNTS MV Decel Time: 289 msec    Systemic VTI:  0.24 m MV E velocity: 69.40 cm/s  Systemic Diam: 1.80 cm MV A velocity: 66.80 cm/s MV E/A ratio:  1.04 Olga Millers MD Electronically signed by Olga Millers MD Signature Date/Time: 11/29/2019/1:22:46 PM    Final    DG HIP PORT UNILAT WITH PELVIS 1V LEFT  Result Date: 11/29/2019 CLINICAL DATA:  Fall. EXAM: DG HIP (WITH OR WITHOUT PELVIS) 1V PORT LEFT COMPARISON:  CT 05/03/2011. FINDINGS: Contrast noted in the bladder from prior CT. Mild degenerative change lumbar spine and both hips. Well-circumscribed bony densities noted adjacent to the right ischium and right greater trochanter most likely from old injury. No acute bony or joint abnormality identified. No evidence of fracture or dislocation. IMPRESSION: Mild degenerative changes lumbar spine and both hips. Well-circumscribed bony densities noted adjacent to the right ischium and right greater trochanter most likely related to old injury. No acute abnormality identified. Electronically Signed   By: Maisie Fus  Register   On: 11/29/2019 11:08    Assessment/Plan: Diagnosis: Bilateral brain infarcts, with left hemorrhagic transformation. Stroke: Continue secondary stroke prophylaxis and Risk Factor Modification listed below:   Blood Pressure Management:  Continue current medication with prn's with permisive HTN per primary team Statin Agent:   Diabetes management:   PT/OT for mobility, ADL training  Labs and images (see above) independently reviewed.   Records reviewed and summated above.  1. Does the need for close, 24 hr/day medical supervision in concert with the patient's rehab needs make it unreasonable for this patient to be served in a less intensive setting? Yes 2. Co-Morbidities requiring supervision/potential complications: hyperglycemia, hypokalemia (continue to monitor and replete as necessary), acute blood loss anemia, HTN (monitor and provide prns in accordance with increased physical exertion and pain), T2 DM (Monitor in accordance with exercise and adjust meds as necessary) 3. Due to bladder management, bowel management, safety, disease management, medication administration, pain management and patient education, does the patient require 24 hr/day rehab nursing? Yes 4. Does the patient require coordinated care of a physician, rehab nurse, therapy disciplines of PT/OT/SLP to address physical and functional deficits in the context of the above medical diagnosis(es)? Yes Addressing deficits in the following areas: balance, endurance, locomotion, strength, transferring, bathing, dressing, toileting, cognition and psychosocial support 5. Can the patient actively participate in an intensive therapy program of at least 3 hrs of therapy per day at least 5 days per week? Potentially 6. The potential for patient to make measurable gains while on inpatient rehab is excellent 7. Anticipated functional outcomes upon discharge from inpatient rehab are supervision and min assist  with PT, supervision and min assist with OT, supervision with SLP. 8. Estimated rehab length of stay to reach the above functional goals is: 14-18 days. 9. Anticipated discharge destination: Home 10. Overall Rehab/Functional Prognosis: good  RECOMMENDATIONS: This patient's condition is appropriate for continued rehabilitative care in the following setting: CIR when medically stable and work-up complete. Patient has agreed to participate in recommended program.  Potentially Note that insurance prior authorization may be required for reimbursement  for recommended care.  Comment: Rehab Admissions Coordinator to follow up.  I have personally performed a face to face diagnostic evaluation, including, but not limited to relevant history and physical exam findings, of this patient and developed relevant assessment and plan.  Additionally, I have reviewed and concur with the physician assistant's documentation above.   Maryla Morrow, MD, ABPMR Jacquelynn Cree, PA-C 11/30/2019

## 2019-11-30 NOTE — Evaluation (Signed)
Speech Language Pathology Evaluation Patient Details Name: Manuel Chapman MRN: [id number] DOB: [birth year] Today's Date: 11/30/2019 Time: 8676-1950 SLP Time Calculation (min) (ACUTE ONLY): 12 min  Problem List:  Patient Active Problem List   Diagnosis Date Noted  . ICH (intracerebral hemorrhage) (HCC) 11/29/2019  . Gout 05/10/2015  . HTN (hypertension) 05/10/2015  . Dyslipidemia 05/10/2015  . Diabetes (HCC) 04/10/2015   Past Medical History:  Past Medical History:  Diagnosis Date  . Diabetes mellitus   . Hypertension    Past Surgical History: History reviewed. No pertinent surgical history. HPI:  74 y.o. male with past medical history significant for hypertension, diabetes mellitus presented to ED 8/4 after daughter noticed slurred speech and left facial droop; pt fell. MRI scan of the brain showed hemorrhagic left subcortical and the rest small right temporal embolic infarcts.      Assessment / Plan / Recommendation Clinical Impression  Pt presents with marked changes in cognition c/b disorientation to time, impairments in working memory and sustained and selective attention, difficulty following through with task instructions (related to attention), emerging recognition of errors but difficulty with self-correction.  Speech is mildly dysarthric.  Pt has difficulty following multistep commands.  Language is otherwise intact. His daughter, Elon Jester, was present for session, and describes her father as being independent PTA, handy around the house.  Another daughter and a cousin live with him and family can provide 24 hour support post-discharge.  Pt will benefit from SLP f/u to address the aforementioned cognitive deficits.  Consider CIR pending OT/PT evaluations.    SLP Assessment  SLP Recommendation/Assessment: Patient needs continued Speech Lanaguage Pathology Services SLP Visit Diagnosis: Cognitive communication deficit (R41.841)    Follow Up Recommendations  Inpatient Rehab     Frequency and Duration min 2x/week  2 weeks      SLP Evaluation Cognition  Overall Cognitive Status: Impaired/Different from baseline Arousal/Alertness: Awake/alert Orientation Level: Oriented to person;Oriented to place;Disoriented to time;Disoriented to situation Attention: Sustained;Selective Sustained Attention: Impaired Sustained Attention Impairment: Verbal basic Selective Attention: Impaired Selective Attention Impairment: Verbal basic Memory: Impaired Memory Impairment: Storage deficit;Retrieval deficit Awareness: Impaired Awareness Impairment: Emergent impairment Problem Solving: Impaired Problem Solving Impairment: Verbal basic Safety/Judgment: Impaired       Comprehension  Auditory Comprehension Overall Auditory Comprehension: Impaired Commands: Impaired Multistep Basic Commands: 50-74% accurate Reading Comprehension Reading Status: Not tested    Expression Expression Primary Mode of Expression: Verbal Verbal Expression Overall Verbal Expression: Appears within functional limits for tasks assessed   Oral / Motor  Oral Motor/Sensory Function Overall Oral Motor/Sensory Function: Within functional limits Motor Speech Overall Motor Speech: Impaired Respiration: Within functional limits Phonation: Normal Resonance: Within functional limits Articulation: Impaired Level of Impairment: Conversation Intelligibility: Intelligibility reduced Word: 75-100% accurate Phrase: 75-100% accurate Sentence: 75-100% accurate Conversation: 75-100% accurate Motor Planning: Witnin functional limits   GO                    [name] [name] 11/30/2019, 9:27 AM  [name] L. Samson Frederic, MA CCC/SLP Acute Rehabilitation Services Office number [phone] Pager [phone]

## 2019-11-30 NOTE — Evaluation (Addendum)
Clinical/Bedside Swallow Evaluation Patient Details  Name: Manuel Chapman MRN: [id number] Date of Birth: 02/05/1946  Today's Date: 11/30/2019 Time: SLP Start Time (ACUTE ONLY): 0900 SLP Stop Time (ACUTE ONLY): 0915 SLP Time Calculation (min) (ACUTE ONLY): 15 min  Past Medical History:  Past Medical History:  Diagnosis Date  . Diabetes mellitus   . Hypertension    Past Surgical History: History reviewed. No pertinent surgical history. HPI:  74 y.o. male with past medical history significant for hypertension, diabetes mellitus presented to ED 8/4 after daughter noticed slurred speech and left facial droop; pt fell. MRI scan of the brain showed hemorrhagic left subcortical and the rest small right temporal embolic infarcts.   Pt passed [facility] swallow screen but was asked by neurology to f/u with formal SLP clinical assessment.    Assessment / Plan / Recommendation Clinical Impression  Pt presents with functional swallow with adequate mastication despite absence of teeth, brisk swallow response, no s/s of aspiration despite being taxed by successive boluses of thin liquid and mixed solid/liquid consistencies.  No dysphagia identified.  Recommend continuing regular solids, thin liquids.  No f/u needed.   SLP Visit Diagnosis: Dysphagia, unspecified (R13.10)    Aspiration Risk  No limitations    Diet Recommendation   regular solids, thin liquids  Medication Administration: Whole meds with liquid    Other  Recommendations Oral Care Recommendations: Oral care BID   Follow up Recommendations None      Frequency and Duration min 2x/week          Prognosis        Swallow Study   General Date of Onset: 11/28/19 HPI: 74 y.o. male with past medical history significant for hypertension, diabetes mellitus presented to ED 8/4 after daughter noticed slurred speech and left facial droop; pt fell. MRI scan of the brain showed hemorrhagic left subcortical and the rest small right temporal embolic  infarcts.    Type of Study: Bedside Swallow Evaluation Previous Swallow Assessment: no Diet Prior to this Study: Regular;Thin liquids Temperature Spikes Noted: No Respiratory Status: Room air History of Recent Intubation: No Behavior/Cognition: Alert;Cooperative Oral Cavity Assessment: Within Functional Limits Oral Care Completed by SLP: No Oral Cavity - Dentition: Edentulous Vision: Functional for self-feeding Self-Feeding Abilities: Able to feed self Patient Positioning: Upright in bed Baseline Vocal Quality: Normal Volitional Cough: Strong Volitional Swallow: Able to elicit    Oral/Motor/Sensory Function Overall Oral Motor/Sensory Function: Within functional limits   Ice Chips Ice chips: Within functional limits   Thin Liquid Thin Liquid: Within functional limits    Nectar Thick Nectar Thick Liquid: Not tested   Honey Thick Honey Thick Liquid: Not tested   Puree Puree: Within functional limits   Solid     Solid: Within functional limits      Manuel Chapman Manuel Chapman 11/30/2019,9:33 AM  Manuel Chapman L. Manuel Frederic, MA CCC/SLP Acute Rehabilitation Services Office number [phone] Pager [phone]

## 2019-12-01 DIAGNOSIS — D62 Acute posthemorrhagic anemia: Secondary | ICD-10-CM

## 2019-12-01 DIAGNOSIS — I615 Nontraumatic intracerebral hemorrhage, intraventricular: Secondary | ICD-10-CM

## 2019-12-01 DIAGNOSIS — R739 Hyperglycemia, unspecified: Secondary | ICD-10-CM

## 2019-12-01 DIAGNOSIS — E876 Hypokalemia: Secondary | ICD-10-CM

## 2019-12-01 DIAGNOSIS — E119 Type 2 diabetes mellitus without complications: Secondary | ICD-10-CM

## 2019-12-01 LAB — BASIC METABOLIC PANEL
Anion gap: 10 (ref 5–15)
BUN: 15 mg/dL (ref 8–23)
CO2: 25 mmol/L (ref 22–32)
Calcium: 8.2 mg/dL — ABNORMAL LOW (ref 8.9–10.3)
Chloride: 97 mmol/L — ABNORMAL LOW (ref 98–111)
Creatinine, Ser: 1.09 mg/dL (ref 0.61–1.24)
GFR calc Af Amer: >60 mL/min (ref >60)
GFR calc non Af Amer: >60 mL/min (ref >60)
Glucose, Bld: 232 mg/dL — ABNORMAL HIGH (ref 70–99)
Potassium: 3.9 mmol/L (ref 3.5–5.1)
Sodium: 132 mmol/L — ABNORMAL LOW (ref 135–145)

## 2019-12-01 LAB — CBC
HCT: 31.4 % — ABNORMAL LOW (ref 39.0–52.0)
Hemoglobin: 10.4 g/dL — ABNORMAL LOW (ref 13.0–17.0)
MCH: 30.4 pg (ref 26.0–34.0)
MCHC: 33.1 g/dL (ref 30.0–36.0)
MCV: 91.8 fL (ref 80.0–100.0)
Platelets: 232 10*3/uL (ref 150–400)
RBC: 3.42 MIL/uL — ABNORMAL LOW (ref 4.22–5.81)
RDW: 13.7 % (ref 11.5–15.5)
WBC: 6.9 10*3/uL (ref 4.0–10.5)
nRBC: 0 % (ref 0.0–0.2)

## 2019-12-01 LAB — GLUCOSE, CAPILLARY
Glucose-Capillary: 239 mg/dL — ABNORMAL HIGH (ref 70–99)
Glucose-Capillary: 174 mg/dL — ABNORMAL HIGH (ref 70–99)
Glucose-Capillary: 187 mg/dL — ABNORMAL HIGH (ref 70–99)
Glucose-Capillary: 203 mg/dL — ABNORMAL HIGH (ref 70–99)
Glucose-Capillary: 151 mg/dL — ABNORMAL HIGH (ref 70–99)

## 2019-12-01 MED ORDER — HEPARIN SODIUM (PORCINE) 5000 UNIT/ML IJ SOLN
5000.0000 [IU] | Freq: Three times a day (TID) | INTRAMUSCULAR | Status: DC
  Administered 2019-12-01 – 2019-12-04 (×8): 5000 [IU] via SUBCUTANEOUS
  Filled 2019-12-01 (×8): qty 1

## 2019-12-01 NOTE — Progress Notes (Signed)
Inpatient Rehab Admissions Coordinator:   Met with patient at bedside to discuss potential CIR admission.Pt. stated interest.  Spoke with pt.'s daughter Rollene Fare, who states interest as well but is unsure that family can provide 24/7 support at discharge. Will follow up with her tomorrow after she has had a chance to talk to other family members.  Will pursue for potential admit next week, pending bed availability.  Clemens Catholic, [place], [place] Admissions Coordinator  [phone] ([place]) [phone] (office)

## 2019-12-01 NOTE — Progress Notes (Signed)
STROKE TEAM PROGRESS NOTE   INTERVAL HISTORY Patient is sitting up in bed for lunch. He has no complains except mild pain at left knee cap and left shoulder, denies pain at left hip. However, pt still has left UE and mild left proximal LE weakness. Pt said this has been going on since admission. As per RN, pt left sided weakness has not changed during admission.   Vitals:   12/01/19 0350 12/01/19 0912 12/01/19 1202 12/01/19 1543  BP: (!) 163/70 (!) 132/59 133/63 (!) 114/53  Pulse: 79 77 82 72  Resp: 16 20 20 18   Temp: (!) 97.5 F (36.4 C) 98.3 F (36.8 C) 99.1 F (37.3 C) 99.2 F (37.3 C)  TempSrc: Oral Oral Oral Oral  SpO2: 98% 99% 100% 98%  Weight:      Height:       CBC:  Recent Labs  Lab 11/28/19 2232 12/01/19 0239  WBC 8.2 6.9  NEUTROABS 6.4  --   HGB 12.5*   13.3 10.4*  HCT 39.1   39.0 31.4*  MCV 93.3 91.8  PLT 237 232   Basic Metabolic Panel:  Recent Labs  Lab 11/28/19 2232 12/01/19 0239  NA 135   141 132*  K 3.3*   3.3* 3.9  CL 97*   100 97*  CO2 22 25  GLUCOSE 92   91 232*  BUN 21   23 15   CREATININE 1.36*   1.20 1.09  CALCIUM 9.6 8.2*   Lipid Panel:  Recent Labs  Lab 11/28/19 2232  CHOL 157  TRIG 66  HDL 54  CHOLHDL 2.9  VLDL 13  LDLCALC 90   HgbA1c:  Recent Labs  Lab 11/28/19 2232  HGBA1C 13.3*   Urine Drug Screen:  Recent Labs  Lab 11/30/19 0950  LABOPIA NONE DETECTED  COCAINSCRNUR NONE DETECTED  LABBENZ NONE DETECTED  AMPHETMU NONE DETECTED  THCU NONE DETECTED  LABBARB NONE DETECTED    Alcohol Level  Recent Labs  Lab 11/28/19 2232  ETH <10    IMAGING past 24 hours No results found.  PHYSICAL EXAM  Temp:  [97.5 F (36.4 C)-99.2 F (37.3 C)] 99.2 F (37.3 C) (08/07 1543) Pulse Rate:  [72-85] 72 (08/07 1543) Resp:  [16-20] 18 (08/07 1543) BP: (114-163)/(53-79) 114/53 (08/07 1543) SpO2:  [98 %-100 %] 98 % (08/07 1543)  General - Well nourished, well developed, in no apparent distress.  Ophthalmologic - fundi not  visualized due to noncooperation.  Cardiovascular - Regular rhythm and rate.  Mental Status -  Level of arousal and orientation to place, self and age were intact, but not orientated to time. Language including expression, naming, repetition, comprehension was assessed and found intact. However, paucity of speech and mild dysarthria  Cranial Nerves II - XII - II - Visual field intact OU. III, IV, VI - Extraocular movements intact. V - Facial sensation intact bilaterally. VII - Facial movement intact bilaterally. VIII - Hearing & vestibular intact bilaterally. X - Palate elevates symmetrically. XI - Chin turning & shoulder shrug intact bilaterally. XII - Tongue protrusion intact.  Motor Strength - The patients strength was normal in right UE and LE, however, LUE 3+/5 proximal and distally. LLE proximal 4/5 and distal 5/5 and pronator drift was present.  Bulk was normal and fasciculations were absent.   Motor Tone - Muscle tone was assessed at the neck and appendages and was normal.  Reflexes - The patients reflexes were symmetrical in all extremities and he had no pathological  reflexes.  Sensory - Light touch, temperature/pinprick were assessed and were symmetrical.    Coordination - The patient had normal movements in the hands with no ataxia or dysmetria, but slow action L>R.  Tremor was absent.  Gait and Station - deferred.   ASSESSMENT/PLAN [name] is a 74 y.o. male with history of hypertension and diabetes mellitus presents with slurred speech and facial droop. Fell at stroke onset.   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 L posterior vertex and posterior L parietal lobe IPH or SAH. ? Mets.   MRI w/w/o  L frontoparietal, caudate IPH w/ IVH.  Punctate superior R temporal lobe infarct. L caudate head restricted diffusion.   2D Echo EF 55-60%. No source of embolus. LA mildly dilated.   LDL 90  HgbA1c 13.3  VTE prophylaxis - heparin subq  UDS - negative  No antithrombotic prior to admission, now on No antithrombotic given hemorrhage   Therapy recommendations:  CIR  Disposition:  pending - has a 74 yo dtr that lives w/ him who works during the day  Hypertension  Home meds:  Amlodipine 10, chlorthalidone 25, lisinopril 40, metoprolol 50 xl  Stable now off cleviprex gtt  SBP goal to < 160    Resume amlodpine 10, lisinopril 40 and metoprolol 50  Prn labetolol  Long-term BP goal normotensive  Hyperlipidemia  Home meds:  lipitor 40  Hold statin in setting of acute hemorrhage  LDL 90  Resume statin at discharge  Diabetes type II   Home meds:  Glimepiride 2, NPH 65u bid, metformin 1000 bid, actos 45  HgbA1c 13.3, goal < 7.0  DB RN following.  On levemir to 15 bid   SSI sensitive q4h  CBG monitoring  Needs close PCP follow up for better DM control  Other Stroke Risk Factors  Advanced age  ETOH use, alcohol level <10, advised to drink no more than 2 drink(s) a day  Obesity, Body mass index is 36.51 kg/m., recommend weight loss, diet and exercise as appropriate   Other Active Problems  Pain L knee and hip. Check XR knee neg. XR L hip w/ degenerative changes LS and both hips, no acute abnormality   Gout - on allopurinol  Hypokalemia 3.3 - replaced ->3.9  AKI Cre 1.2->1.36->1.09  Hospital day # 2  Marvel Plan, MD PhD Stroke Neurology 12/01/2019 7:02 PM   To contact Stroke Continuity provider, please refer to [email or web address]. After hours, contact General Neurology

## 2019-12-02 DIAGNOSIS — W19XXXA Unspecified fall, initial encounter: Secondary | ICD-10-CM

## 2019-12-02 LAB — BASIC METABOLIC PANEL
Anion gap: 10 (ref 5–15)
BUN: 15 mg/dL (ref 8–23)
CO2: 27 mmol/L (ref 22–32)
Calcium: 8.4 mg/dL — ABNORMAL LOW (ref 8.9–10.3)
Chloride: 99 mmol/L (ref 98–111)
Creatinine, Ser: 0.86 mg/dL (ref 0.61–1.24)
GFR calc Af Amer: >60 mL/min (ref >60)
GFR calc non Af Amer: >60 mL/min (ref >60)
Glucose, Bld: 141 mg/dL — ABNORMAL HIGH (ref 70–99)
Potassium: 3.9 mmol/L (ref 3.5–5.1)
Sodium: 136 mmol/L (ref 135–145)

## 2019-12-02 LAB — CBC
HCT: 32.5 % — ABNORMAL LOW (ref 39.0–52.0)
Hemoglobin: 10.7 g/dL — ABNORMAL LOW (ref 13.0–17.0)
MCH: 30.3 pg (ref 26.0–34.0)
MCHC: 32.9 g/dL (ref 30.0–36.0)
MCV: 92.1 fL (ref 80.0–100.0)
Platelets: 241 10*3/uL (ref 150–400)
RBC: 3.53 MIL/uL — ABNORMAL LOW (ref 4.22–5.81)
RDW: 13.6 % (ref 11.5–15.5)
WBC: 6.9 10*3/uL (ref 4.0–10.5)
nRBC: 0 % (ref 0.0–0.2)

## 2019-12-02 LAB — GLUCOSE, CAPILLARY
Glucose-Capillary: 319 mg/dL — ABNORMAL HIGH (ref 70–99)
Glucose-Capillary: 247 mg/dL — ABNORMAL HIGH (ref 70–99)
Glucose-Capillary: 158 mg/dL — ABNORMAL HIGH (ref 70–99)
Glucose-Capillary: 197 mg/dL — ABNORMAL HIGH (ref 70–99)
Glucose-Capillary: 190 mg/dL — ABNORMAL HIGH (ref 70–99)
Glucose-Capillary: 235 mg/dL — ABNORMAL HIGH (ref 70–99)

## 2019-12-02 NOTE — TOC Initial Note (Signed)
Transition of Care [facility]) - Initial/Assessment Note    Patient Details  Name: Manuel Chapman MRN: [id number] Date of Birth: [birth year]  [facility]) CM/SW Contact:    Jacquelynn Cree Phone Number: 12/02/2019, 1:02 PM  Clinical Narrative:                 CSW met with patient and patient's daughter Rollene Fare bedside. Rollene Fare expressed they were considering CIR, but patient will not have adequate support at home once he discharges. She expressed SNF would be a better discharge plan for patient, he agreed.   Prior to arrival, patient lived in a single story home with steps to enter. Patient used a cane to ambulate at time, but was otherwise independent.   Permission provided to fax referrals to [facility]. CSW explained insurance process and where to find Medicare ratings. No other questions expressed at this time.     Expected Discharge Plan: Skilled Nursing Facility Barriers to Discharge: Continued Medical Work up, Ship broker   Patient Goals and CMS Choice   CMS Medicare.gov Compare Post Acute Care list provided to:: Patient Choice offered to / list presented to : Patient  Expected Discharge Plan and Services Expected Discharge Plan: [place] In-house Referral: Clinical Social Work     Living arrangements for the past 2 months: [employer]                                      Prior Living Arrangements/Services Living arrangements for the past 2 months: Single Family Home Lives with:: Minor Children Patient language and need for interpreter reviewed:: Yes        Need for Family Participation in Patient Care: No (Comment) Care giver support system in place?: Yes (comment)   Criminal Activity/Legal Involvement Pertinent to Current Situation/Hospitalization: No - Comment as needed  Activities of Daily Living Home Assistive Devices/Equipment: Cane (specify quad or straight) ADL Screening (condition at time  of admission) Patient's cognitive ability adequate to safely complete daily activities?: Yes Is the patient deaf or have difficulty hearing?: No Does the patient have difficulty seeing, even when wearing glasses/contacts?: No Does the patient have difficulty concentrating, remembering, or making decisions?: No Patient able to express need for assistance with ADLs?: Yes Does the patient have difficulty dressing or bathing?: No Independently performs ADLs?: Yes (appropriate for developmental age) Does the patient have difficulty walking or climbing stairs?: No Weakness of Legs: None Weakness of Arms/Hands: None  Permission Sought/Granted Permission sought to share information with : Facility Sport and exercise psychologist, Family Supports Permission granted to share information with : Yes, Verbal Permission Granted  Share Information with NAME: Rollene Fare  Permission granted to share info w AGENCY: SNFs  Permission granted to share info w Relationship: Daughter  Permission granted to share info w Contact Information: [phone]  Emotional Assessment   Attitude/Demeanor/Rapport: Unable to Assess Affect (typically observed): Unable to Assess Orientation: : Oriented to Place, Oriented to Self, Oriented to  Time, Oriented to Situation Alcohol / Substance Use: Not Applicable Psych Involvement: No (comment)  Admission diagnosis:  Hypoglycemia [E16.2] ICH (intracerebral hemorrhage) ([place]) [I61.9] Fall [W19.XXXA] Intraparenchymal hemorrhage of brain ([place]) [I61.9] Fall, initial encounter [W19.XXXA] Type 2 diabetes mellitus without complication, unspecified whether long term insulin use ([place]) [E11.9] Patient Active Problem List   Diagnosis Date Noted  . Pressure injury of skin 11/30/2019  . Hyperglycemia   .  Hypokalemia   . Acute blood loss anemia   . ICH (intracerebral hemorrhage) ([place]) 11/29/2019  . Gout 05/10/2015  . HTN (hypertension) 05/10/2015  . Dyslipidemia 05/10/2015  . Diabetes ([place])  04/10/2015   PCP:  Lorene Dy, MD Pharmacy:   [facility] [place], [place] - [address] [place] [address] Phone: [phone] Fax: [phone]     Social Determinants of Health (SDOH) Interventions    Readmission Risk Interventions No flowsheet data found.

## 2019-12-02 NOTE — Progress Notes (Signed)
STROKE TEAM PROGRESS NOTE   INTERVAL HISTORY No family at bedside.  Patient lying in bed, comfortably.  Still has some left shoulder, left hip and left knee Pain which could explain his left arm drift, left lower extremity extremity mild weakness.  PT/OT recommend SNF.  Vitals:   12/01/19 2338 12/02/19 0416 12/02/19 0757 12/02/19 1211  BP: 137/61 (!) 145/66 136/67 (!) 143/66  Pulse: 72 78 70 75  Resp: 16 18 18 18   Temp: 98.9 F (37.2 C) 99 F (37.2 C) 98.3 F (36.8 C) 98.5 F (36.9 C)  TempSrc: Oral Oral Oral Oral  SpO2: 100% 99% 99% 100%  Weight:      Height:       CBC:  Recent Labs  Lab 11/28/19 2232 11/28/19 2232 12/01/19 0239 12/02/19 0205  WBC 8.2   < > 6.9 6.9  NEUTROABS 6.4  --   --   --   HGB 12.5*  13.3   < > 10.4* 10.7*  HCT 39.1  39.0   < > 31.4* 32.5*  MCV 93.3   < > 91.8 92.1  PLT 237   < > 232 241   < > = values in this interval not displayed.   Basic Metabolic Panel:  Recent Labs  Lab 12/01/19 0239 12/02/19 0205  NA 132* 136  K 3.9 3.9  CL 97* 99  CO2 25 27  GLUCOSE 232* 141*  BUN 15 15  CREATININE 1.09 0.86  CALCIUM 8.2* 8.4*   Lipid Panel:  Recent Labs  Lab 11/28/19 2232  CHOL 157  TRIG 66  HDL 54  CHOLHDL 2.9  VLDL 13  LDLCALC 90   HgbA1c:  Recent Labs  Lab 11/28/19 2232  HGBA1C 13.3*   Urine Drug Screen:  Recent Labs  Lab 11/30/19 0950  LABOPIA NONE DETECTED  COCAINSCRNUR NONE DETECTED  LABBENZ NONE DETECTED  AMPHETMU NONE DETECTED  THCU NONE DETECTED  LABBARB NONE DETECTED    Alcohol Level  Recent Labs  Lab 11/28/19 2232  ETH <10    IMAGING past 24 hours No results found.  PHYSICAL EXAM  Temp:  [98.3 F (36.8 C)-99.5 F (37.5 C)] 98.5 F (36.9 C) (08/08 1211) Pulse Rate:  [70-78] 75 (08/08 1211) Resp:  [16-18] 18 (08/08 1211) BP: (114-146)/(53-70) 143/66 (08/08 1211) SpO2:  [98 %-100 %] 100 % (08/08 1211)  General - Well nourished, well developed, in no apparent distress.  Ophthalmologic - fundi  not visualized due to noncooperation.  Cardiovascular - Regular rhythm and rate.  Musculoskeletal - mild tenderness to touch at left shoulder, left hip and left knee  Mental Status -  Level of arousal and orientation to place, self and age were intact, but not orientated to time. Language including expression, naming, repetition, comprehension was assessed and found intact   Cranial Nerves II - XII - II - Visual field intact OU. III, IV, VI - Extraocular movements intact. V - Facial sensation intact bilaterally. VII - Facial movement intact bilaterally. VIII - Hearing & vestibular intact bilaterally. X - Palate elevates symmetrically. XI - Chin turning & shoulder shrug intact bilaterally. XII - Tongue protrusion intact.  Motor Strength - The patient's strength was normal in right UE and LE, however, LUE 4/5 proximal with pronator drift. LLE proximal 4/5 and distal 5/5.  Bulk was normal and fasciculations were absent.   Motor Tone - Muscle tone was assessed at the neck and appendages and was normal.  Reflexes - The patient's reflexes were symmetrical in  all extremities and he had no pathological reflexes.  Sensory - Light touch, temperature/pinprick were assessed and were symmetrical.    Coordination - The patient had normal movements in the hands with no ataxia or dysmetria, but slow action L>R.  Tremor was absent.  Gait and Station - deferred.   ASSESSMENT/PLAN [name] is a 74 y.o. male with history of hypertension and diabetes mellitus presents with slurred speech and facial droop. Fell at stroke onset.   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 L posterior vertex and posterior L parietal lobe IPH or SAH. ? Mets.   MRI w/w/o  L frontoparietal,  caudate IPH w/ IVH. Punctate superior R temporal lobe infarct. L caudate head restricted diffusion.   2D Echo EF 55-60%. No source of embolus. LA mildly dilated.   LDL 90  HgbA1c 13.3  VTE prophylaxis - heparin subq  UDS - negative  No antithrombotic prior to admission, now on No antithrombotic given hemorrhage. Consider ASA once ICH near resolution.   Therapy recommendations:  SNF  Disposition:  pending  Hypertension  Home meds:  Amlodipine 10, chlorthalidone 25, lisinopril 40, metoprolol 50 xl . Stable  . SBP goal to < 160   . Resume amlodpine 10, lisinopril 40 and metoprolol 50 . PRN labetolol . Long-term BP goal normotensive  Hyperlipidemia  Home meds:  lipitor 40  Hold statin in setting of acute hemorrhage  LDL 90  Resume statin at discharge  Diabetes type II   Home meds:  Glimepiride 2, NPH 65u bid, metformin 1000 bid, actos 45  HgbA1c 13.3, goal < 7.0  DB RN following.  On levemir to 15 bid   SSI sensitive q4h  CBG monitoring  Needs close PCP follow up for better DM control  Other Stroke Risk Factors  Advanced age  ETOH use, alcohol level <10, advised to drink no more than 2 drink(s) a day  Obesity, Body mass index is 36.51 kg/m., recommend weight loss, diet and exercise as appropriate   Other Active Problems  Pain L shoulder, knee and hip. Check XR knee neg. XR L hip w/ degenerative changes LS and both hips, no acute abnormality   Gout - on allopurinol  Hypokalemia 3.3 - replaced ->3.9->3.9  AKI Cre 1.2->1.36->1.09->0.86  Anemia - Hgb - 13.3->10.4->10.7   Hospital day # 3  Marvel Plan, MD PhD Stroke Neurology 12/02/2019 5:26 PM   To contact Stroke Continuity provider, please refer to [email or web address]. After hours, contact General Neurology

## 2019-12-02 NOTE — NC FL2 (Signed)
[place] MEDICAID FL2 LEVEL OF CARE SCREENING TOOL     IDENTIFICATION  Patient Name: Manuel Chapman Birthdate: 01-18-1946 Sex: male Admission Date (Current Location): 11/28/2019  [facility] and [place] Number:  Producer, television/film/video and Address:  [facility], [address] [place], [place] [address]      Provider Number: [id number]  Attending Physician Name and Address:  Marvel Plan, MD  Relative Name and Phone Number:  Loleta Chance    Current Level of Care: Hospital Recommended Level of Care: Skilled Nursing Facility Prior Approval Number:    Date Approved/Denied:   PASRR Number: [id number]  Discharge Plan: SNF    Current Diagnoses: Patient Active Problem List   Diagnosis Date Noted  . Pressure injury of skin 11/30/2019  . Hyperglycemia   . Hypokalemia   . Acute blood loss anemia   . ICH (intracerebral hemorrhage) (HCC) 11/29/2019  . Gout 05/10/2015  . HTN (hypertension) 05/10/2015  . Dyslipidemia 05/10/2015  . Diabetes (HCC) 04/10/2015    Orientation RESPIRATION BLADDER Height & Weight     Self, Time, Situation, Place  Normal Incontinent, External catheter Weight: 226 lb 3.1 oz (102.6 kg) Height:  5\' 6"  (167.6 cm)  BEHAVIORAL SYMPTOMS/MOOD NEUROLOGICAL BOWEL NUTRITION STATUS      Continent Diet (See discharge summary)  AMBULATORY STATUS COMMUNICATION OF NEEDS Skin   Extensive Assist Verbally PU Stage and Appropriate Care                       Personal Care Assistance Level of Assistance  Bathing, Feeding, Dressing Bathing Assistance: Limited assistance Feeding assistance: Independent Dressing Assistance: Limited assistance     Functional Limitations Info  Sight, Hearing, Speech Sight Info: Adequate Hearing Info: Adequate Speech Info: Adequate    SPECIAL CARE FACTORS FREQUENCY  PT (By licensed PT), OT (By licensed OT)     PT Frequency: 5x a week OT Frequency: 5x a week            Contractures Contractures  Info: Not present    Additional Factors Info  Code Status, Allergies Code Status Info: Full Allergies Info: NKA           Current Medications (12/02/2019):  This is the current hospital active medication list Current Facility-Administered Medications  Medication Dose Route Frequency Provider Last Rate Last Admin  .  stroke: mapping our early stages of recovery book   Does not apply Once 02/01/2020 L, NP      . 0.9 %  sodium chloride infusion   Intravenous PRN Annie Main, NP   Paused at 11/30/19 1348  . acetaminophen (TYLENOL) tablet 650 mg  650 mg Oral Q4H PRN 01/30/20, NP   650 mg at 11/30/19 0816   Or  . acetaminophen (TYLENOL) 160 MG/5ML solution 650 mg  650 mg Per Tube Q4H PRN 01/30/20, NP       Or  . acetaminophen (TYLENOL) suppository 650 mg  650 mg Rectal Q4H PRN Layne Benton, NP      . allopurinol (ZYLOPRIM) tablet 300 mg  300 mg Oral Daily Layne Benton L, NP   300 mg at 12/02/19 0932  . amLODipine (NORVASC) tablet 10 mg  10 mg Oral Daily 02/01/20 L, NP   10 mg at 12/02/19 0931  . Chlorhexidine Gluconate Cloth 2 % PADS 6 each  6 each Topical Q0600 02/01/20, NP   6 each at 12/02/19 0654  .  heparin injection 5,000 Units  5,000 Units Subcutaneous Q8H Marvel Plan, MD   5,000 Units at 12/02/19 1540  . insulin aspart (novoLOG) injection 0-5 Units  0-5 Units Subcutaneous QHS Biby, Sharon L, NP      . insulin aspart (novoLOG) injection 0-9 Units  0-9 Units Subcutaneous TID WC Layne Benton, NP   2 Units at 12/02/19 1314  . insulin detemir (LEVEMIR) injection 15 Units  15 Units Subcutaneous BID Layne Benton, NP   15 Units at 12/02/19 0932  . labetalol (NORMODYNE) injection 20-40 mg  20-40 mg Intravenous Q2H PRN Annie Main L, NP      . lisinopril (ZESTRIL) tablet 40 mg  40 mg Oral Daily Annie Main L, NP   40 mg at 12/02/19 0931  . metoprolol succinate (TOPROL-XL) 24 hr tablet 50 mg  50 mg Oral Daily Annie Main L, NP   50 mg at 12/02/19 0931  .  pantoprazole (PROTONIX) EC tablet 40 mg  40 mg Oral QHS Layne Benton, NP   40 mg at 12/01/19 2203  . senna-docusate (Senokot-S) tablet 1 tablet  1 tablet Oral BID Layne Benton, NP   1 tablet at 12/02/19 7517     Discharge Medications: Please see discharge summary for a list of discharge medications.  Relevant Imaging Results:  Relevant Lab Results:   Additional Information SSN [id number]  Dannette Barbara [place], [place]

## 2019-12-02 NOTE — Progress Notes (Signed)
Inpatient Rehab Admissions Coordinator:   I was contacted by SW at request of Pt.'s daughter and was notified that they would live to pursue SNF placement as family is unable to provide 24/7 support at discharge. CIR will sign off.   Megan Salon, MS, CCC-SLP Rehab Admissions Coordinator  [phone] (celll) [phone] (office)

## 2019-12-03 DIAGNOSIS — E7800 Pure hypercholesterolemia, unspecified: Secondary | ICD-10-CM

## 2019-12-03 LAB — GLUCOSE, CAPILLARY
Glucose-Capillary: 117 mg/dL — ABNORMAL HIGH (ref 70–99)
Glucose-Capillary: 212 mg/dL — ABNORMAL HIGH (ref 70–99)
Glucose-Capillary: 184 mg/dL — ABNORMAL HIGH (ref 70–99)
Glucose-Capillary: 197 mg/dL — ABNORMAL HIGH (ref 70–99)

## 2019-12-03 LAB — BASIC METABOLIC PANEL
Anion gap: 10 (ref 5–15)
BUN: 15 mg/dL (ref 8–23)
CO2: 27 mmol/L (ref 22–32)
Calcium: 8.7 mg/dL — ABNORMAL LOW (ref 8.9–10.3)
Chloride: 101 mmol/L (ref 98–111)
Creatinine, Ser: 0.99 mg/dL (ref 0.61–1.24)
GFR calc Af Amer: >60 mL/min (ref >60)
GFR calc non Af Amer: >60 mL/min (ref >60)
Glucose, Bld: 86 mg/dL (ref 70–99)
Potassium: 4 mmol/L (ref 3.5–5.1)
Sodium: 138 mmol/L (ref 135–145)

## 2019-12-03 LAB — CBC
HCT: 35.3 % — ABNORMAL LOW (ref 39.0–52.0)
Hemoglobin: 11.5 g/dL — ABNORMAL LOW (ref 13.0–17.0)
MCH: 29.9 pg (ref 26.0–34.0)
MCHC: 32.6 g/dL (ref 30.0–36.0)
MCV: 91.9 fL (ref 80.0–100.0)
Platelets: 241 10*3/uL (ref 150–400)
RBC: 3.84 MIL/uL — ABNORMAL LOW (ref 4.22–5.81)
RDW: 13.6 % (ref 11.5–15.5)
WBC: 7 10*3/uL (ref 4.0–10.5)
nRBC: 0 % (ref 0.0–0.2)

## 2019-12-03 MED ORDER — ATORVASTATIN CALCIUM 10 MG PO TABS
20.0000 mg | ORAL_TABLET | Freq: Every day | ORAL | Status: DC
  Administered 2019-12-04: 20 mg via ORAL
  Filled 2019-12-03: qty 2

## 2019-12-03 MED ORDER — GLIMEPIRIDE 1 MG PO TABS
1.0000 mg | ORAL_TABLET | Freq: Every day | ORAL | Status: DC
  Administered 2019-12-04: 1 mg via ORAL
  Filled 2019-12-03: qty 1

## 2019-12-03 NOTE — Progress Notes (Addendum)
STROKE TEAM PROGRESS NOTE   INTERVAL HISTORY No family at bedside. Patient up in chair complaining that he wants to get back in bed. NAD. Plan for SNF placement.  Vitals:   12/02/19 2326 12/03/19 0354 12/03/19 0826 12/03/19 1236  BP: (!) 152/68 (!) 153/71 (!) 136/56 (!) 128/56  Pulse: 69 68 71 81  Resp: 18 18 20 20   Temp: 99.3 F (37.4 C) 98.6 F (37 C) 98.6 F (37 C) 98.7 F (37.1 C)  TempSrc: Oral Oral Oral Oral  SpO2: 99% 100% 100% 100%  Weight:      Height:       CBC:  Recent Labs  Lab 11/28/19 2232 12/01/19 0239 12/02/19 0205 12/03/19 0316  WBC 8.2   < > 6.9 7.0  NEUTROABS 6.4  --   --   --   HGB 12.5*  13.3   < > 10.7* 11.5*  HCT 39.1  39.0   < > 32.5* 35.3*  MCV 93.3   < > 92.1 91.9  PLT 237   < > 241 241   < > = values in this interval not displayed.   Basic Metabolic Panel:  Recent Labs  Lab 12/02/19 0205 12/03/19 0316  NA 136 138  K 3.9 4.0  CL 99 101  CO2 27 27  GLUCOSE 141* 86  BUN 15 15  CREATININE 0.86 0.99  CALCIUM 8.4* 8.7*   Lipid Panel:  Recent Labs  Lab 11/28/19 2232  CHOL 157  TRIG 66  HDL 54  CHOLHDL 2.9  VLDL 13  LDLCALC 90   HgbA1c:  Recent Labs  Lab 11/28/19 2232  HGBA1C 13.3*   Urine Drug Screen:  Recent Labs  Lab 11/30/19 0950  LABOPIA NONE DETECTED  COCAINSCRNUR NONE DETECTED  LABBENZ NONE DETECTED  AMPHETMU NONE DETECTED  THCU NONE DETECTED  LABBARB NONE DETECTED    Alcohol Level  Recent Labs  Lab 11/28/19 2232  ETH <10    IMAGING past 24 hours No results found.  PHYSICAL EXAM  Temp:  [98.6 F (37 C)-99.4 F (37.4 C)] 98.7 F (37.1 C) (08/09 1236) Pulse Rate:  [68-81] 81 (08/09 1236) Resp:  [18-20] 20 (08/09 1236) BP: (128-153)/(56-71) 128/56 (08/09 1236) SpO2:  [99 %-100 %] 100 % (08/09 1236)  General - Well nourished, well developed, in no apparent distress.  Respiratory: no WOB  Cardiovascular - Regular rhythm and rate.    Mental Status -  Awake alert, oriented to  name/age/month/year. Able to follow commands. No dysarthria naming and repetition intact.  Cranial Nerves II - XII - II - Visual field intact OU. PERRL, EOEMI. Face symmetric.  Motor Strength - The patient's strength was normal in right UE and LE, however, LUE 4/5 proximal with pronator drift. LLE proximal 5/5 Bulk was normal and fasciculations were absent.    Motor Tone - Muscle tone normal.  Reflexes - 2+ biceps and patella Sensory - intact to  Light touch, temperature Coordination - The patient had normal movements in the hands with no ataxia or dysmetria, but slow action L>R.  Tremor was absent.  Gait and Station - deferred.   ASSESSMENT/PLAN [name] is a 74 y.o. male with history of hypertension and diabetes mellitus presents with slurred speech and facial droop. Fell at stroke onset.   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 L posterior vertex and posterior L parietal lobe IPH or SAH. ? Mets.   MRI w/w/o  L frontoparietal, caudate IPH w/ IVH. Punctate superior R temporal lobe infarct. L caudate head restricted diffusion.   2D Echo EF 55-60%. No source of embolus. LA mildly dilated.   LDL 90  HgbA1c 13.3  VTE prophylaxis - heparin subq  UDS - negative  No antithrombotic prior to admission, now on No antithrombotic given hemorrhage. Consider ASA once ICH near resolution.   Therapy recommendations:  SNF  Disposition:  SNF  Hypertension  Home meds:  Amlodipine 10, chlorthalidone 25, lisinopril 40, metoprolol 50 xl . Stable  . SBP goal to < 160   . Resume amlodpine 10, lisinopril 40 and metoprolol 50 . PRN labetolol . Long-term BP goal normotensive  Hyperlipidemia  Home meds:  lipitor 40  Hold statin in setting of acute  hemorrhage  LDL 90  Resume lipitor 20  Continue on discharge  Diabetes type II   Home meds:  Glimepiride 2, NPH 65u bid, metformin 1000 bid, actos 45  HgbA1c 13.3, goal < 7.0  DB RN following.  On levemir to 15 bid   Add glimepiride 1mg  daily  SSI sensitive q4h  CBG monitoring  Appreciate DM coordinator assistance   close PCP follow up for better DM control  Other Stroke Risk Factors  Advanced age  ETOH use, alcohol level <10, advised to drink no more than 2 drink(s) a day  Obesity, Body mass index is 36.51 kg/m., recommend weight loss, diet and exercise as appropriate   Other Active Problems  Pain L shoulder, knee and hip. Check XR knee neg. XR L hip w/ degenerative changes LS and both hips, no acute abnormality   Gout - on allopurinol  Hypokalemia 3.3 - replaced ->3.9->3.9->4.0  AKI Cre 1.2->1.36->1.09->0.86->0.99  Anemia - Hgb - 13.3->10.4->10.7->11.5   Hospital day # 4 , MSN, NP-C [facility] [phone]  ATTENDING NOTE: I reviewed above note and agree with the assessment and plan. Pt was seen and examined.   Pt lying in bed, no complains. Neuro unchanged. PT/OT recommend SNF now. Pending placement. DM coordinator on board, appreciate assistance, will add glimepiride 1mg  on top of levemir. Resume lipitor 20mg .   [phone], MD PhD Stroke Neurology 12/03/2019 11:31 PM     To contact Stroke Continuity provider, please refer to . After hours, contact General Neurology

## 2019-12-03 NOTE — Progress Notes (Addendum)
Physical Therapy Treatment Patient Details Name: Manuel Chapman MRN: [id number] DOB: [birth year] Today's Date: 12/03/2019    History of Present Illness 74 y.o. male with past medical history significant for hypertension, diabetes mellitus presents to the emergency department after daughter noticed slurred speech and having facial droop after experiencing a fall. 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    PT Comments    Pt was lethargic on arrival, needed encouragement to participate on the whole and reasoning with on the importance of working on each activity as encouraged.  Pt fearful of falling during standing tasks when asked to practice unilateral and no UE supported tasks. Emphasis on these standing (ADL) tasks and gait training in the RW.   Follow Up Recommendations  SNF;Supervision/Assistance - 24 hour (family unable to provide care for pt to go directly home)     Equipment Recommendations  Other (comment);Wheelchair (measurements PT);Wheelchair cushion (measurements PT) (TBA)    Recommendations for Other Services       Precautions / Restrictions Precautions Precautions: Fall    Mobility  Bed Mobility Overal bed mobility: Needs Assistance Bed Mobility: Supine to Sit     Supine to sit: Mod assist     General bed mobility comments: pt more easily coordinates L side vs R.  R side lags in scooting, though pt will use R side when cued to do so.  Truncal assist to initiate and boost up and forward.  Transfers Overall transfer level: Needs assistance Equipment used: Rolling walker (2 wheeled) Transfers: Sit to/from Stand Sit to Stand: Mod assist;+2 physical assistance;+2 safety/equipment         General transfer comment: cues for hand placement consistently, assist to come forward and boost.  tactile cues for  posture.  Ambulation/Gait Ambulation/Gait assistance: Mod assist;+2 physical assistance Gait Distance (Feet): 12 Feet (x2) Assistive device: Rolling walker (2 wheeled) Gait Pattern/deviations: Step-to pattern;Step-through pattern;Decreased step length - right;Decreased stance time - right;Decreased stance time - left;Decreased stride length Gait velocity: reduced Gait velocity interpretation: <1.8 ft/sec, indicate of risk for recurrent falls General Gait Details: gait unsteady overall, pt's step lengths are variable with R generally low amplitude and a step to pattern.  With cuing and w/shift assist, pt can step through with R LE.  Walker management is poor and L UE overpowers the right UE which lacks coordination.  Generally right side lacks coordination.   Stairs             Wheelchair Mobility    Modified Rankin (Stroke Patients Only) Modified Rankin (Stroke Patients Only) Pre-Morbid Rankin Score: No symptoms Modified Rankin: Moderately severe disability     Balance Overall balance assessment: Needs assistance Sitting-balance support: Feet supported;Bilateral upper extremity supported Sitting balance-Leahy Scale: Poor Sitting balance - Comments: reliant on UE support, min guard   Standing balance support: Bilateral upper extremity supported Standing balance-Leahy Scale: Poor Standing balance comment: reliant on AD and/or external support.  Will not release both hands without incouragement to attempt.  based on sink activity for 10 min.                            Cognition Arousal/Alertness: Awake/alert Behavior During Therapy: WFL for tasks assessed/performed Overall Cognitive Status: Impaired/Different from baseline  Memory: Decreased short-term memory Following Commands: Follows one step commands with increased time Safety/Judgement: Decreased awareness of safety;Decreased awareness of deficits Awareness:  Intellectual Problem Solving: Slow processing        Exercises      General Comments        Pertinent Vitals/Pain Pain Assessment: No/denies pain    Home Living                      Prior Function            PT Goals (current goals can now be found in the care plan section) Acute Rehab PT Goals Patient Stated Goal: To be able to take care of self  PT Goal Formulation: With patient Time For Goal Achievement: 12/14/19 Potential to Achieve Goals: Good Progress towards PT goals: Progressing toward goals (slow progression)    Frequency    Min 2X/week      PT Plan Discharge plan needs to be updated;Frequency needs to be updated    Co-evaluation PT/OT/SLP Co-Evaluation/Treatment: Yes Reason for Co-Treatment: To address functional/ADL transfers;For patient/therapist safety PT goals addressed during session: Mobility/safety with mobility OT goals addressed during session: ADL's and self-care      AM-PAC PT "6 Clicks" Mobility   Outcome Measure  Help needed turning from your back to your side while in a flat bed without using bedrails?: A Lot Help needed moving from lying on your back to sitting on the side of a flat bed without using bedrails?: A Lot Help needed moving to and from a bed to a chair (including a wheelchair)?: A Lot Help needed standing up from a chair using your arms (e.g., wheelchair or bedside chair)?: A Lot Help needed to walk in hospital room?: A Lot Help needed climbing 3-5 steps with a railing? : Total 6 Click Score: 11    End of Session   Activity Tolerance: Patient tolerated treatment well Patient left: in chair;with call bell/phone within reach;with chair alarm set;with family/visitor present Nurse Communication: Mobility status PT Visit Diagnosis: Other abnormalities of gait and mobility (R26.89);Muscle weakness (generalized) (M62.81);Other symptoms and signs involving the nervous system (R29.898)     Time: 2831-5176 PT Time  Calculation (min) (ACUTE ONLY): 29 min  Charges:  $Therapeutic Activity: 8-22 mins                     12/03/2019  Jacinto Halim., PT Acute Rehabilitation Services [phone]  (pager) [phone]  (office)   Eliseo Gum Anyely Cunning 12/03/2019, 12:50 PM

## 2019-12-03 NOTE — Progress Notes (Signed)
Inpatient Diabetes Program Recommendations  AACE/ADA: New Consensus Statement on Inpatient Glycemic Control (2015)  Target Ranges:  Prepandial:   less than 140 mg/dL      Peak postprandial:   less than 180 mg/dL (1-2 hours)      Critically ill patients:  140 - 180 mg/dL   Lab Results  Component Value Date   GLUCAP 212 (H) 12/03/2019   HGBA1C 13.3 (H) 11/28/2019    Review of Glycemic Control Results for ADELBERT, GASPARD (MRN [id number]) as of 12/03/2019 14:44  Ref. Range 12/02/2019 06:18 12/02/2019 12:10 12/02/2019 16:14 12/02/2019 21:15 12/03/2019 06:14 12/03/2019 12:37  Glucose-Capillary Latest Ref Range: 70 - 99 mg/dL 158 (H) 197 (H) 190 (H) 235 (H) 117 (H) 212 (H)   Diabetes history: DM 2 Outpatient Diabetes medications: NPH 65 units bid, Amaryl 2 mg Daily, Metformin 1000 mg bid, Actos 45 mg Daily Current orders for Inpatient glycemic control:  Levemir 15 units bid Novolog 0-9 units tid + hs  Inpatient Diabetes Program Recommendations:    -Consider Adding Glimepiride 1 mg Daily  Spoke with pt regarding his A1c of 13.3% and Glucose and A1c goals.  Pt reports having an appointment next month with his PCP but has been awhile since he has seen him. Discussed importance of glucose control. Pt reports knowing what to eat and what to do to get his glucose levels down. Pt reports he will be more consistent with his medications.  Pt reports needing a glucose meter at home (glucose meter kit order # [id number]). Pt d/cing to SNF.  Thanks,  Tama Headings RN, MSN, BC-ADM Inpatient Diabetes Coordinator Team Pager [phone] (8a-5p)

## 2019-12-03 NOTE — TOC Progression Note (Signed)
Transition of Care [facility]) - Progression Note    Patient Details  Name: Manuel Chapman MRN: [id number] Date of Birth: 1945-06-20  [facility] CM/SW Contact  Kermit Balo, RN Phone Number: 12/03/2019, 10:29 AM  Clinical Narrative:    CM provided choice of SNF rehab to pts daughter: Manuel Chapman. She selected [facility]. CM has updated Manuel Chapman at [facility]. CM will fax in information to [facility] once PT/OT have reseen the pt today. CM has sent message to PT.  TOC following.   Expected Discharge Plan: Skilled Nursing Facility Barriers to Discharge: Continued Medical Work up, English as a second language teacher  Expected Discharge Plan and Services Expected Discharge Plan: Skilled Nursing Facility In-house Referral: Clinical Social Work     Living arrangements for the past 2 months: Single Family Home                                       Social Determinants of Health (SDOH) Interventions    Readmission Risk Interventions No flowsheet data found.

## 2019-12-03 NOTE — Progress Notes (Signed)
Occupational Therapy Treatment Patient Details Name: Manuel Chapman MRN: [id number] DOB: Jul 02, 1945 Today's Date: 12/03/2019    History of present illness 74 y.o. male with past medical history significant for hypertension, diabetes mellitus presents to the emergency department after daughter noticed slurred speech and having facial droop after experiencing a fall. 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   OT comments  Patient supine in bed and agreeable to OT/PT session.  Requires mod assist for bed mobility, mod +2 to min +2 for safety with transfers given max cueing for recall and carryover to hand placement and technique.  Focus on functional mobility to/from sink with max support for walker mgmt and coordination, mod assist +2 overall. Standing ADLs at sink with perseveration on washing his face, hand over hand assist for attempt to wash his buttocks.  Incontinent of BM during standing and mobility back to recliner.  Pt reports highly fearful of falling.  Encouragement to stay OOB for lunch, RN aware. DC plan remains updated to SNF.     Follow Up Recommendations  SNF;Supervision/Assistance - 24 hour    Equipment Recommendations  Other (comment) (TBD at next venue of care )    Recommendations for Other Services      Precautions / Restrictions Precautions Precautions: Fall Restrictions Weight Bearing Restrictions: No       Mobility Bed Mobility Overal bed mobility: Needs Assistance Bed Mobility: Supine to Sit     Supine to sit: Mod assist     General bed mobility comments: pt more easily coordinates L side vs R.  R side lags in scooting, though pt will use R side when cued to do so.  Truncal assist to initiate and boost up and forward.  Transfers Overall transfer level: Needs assistance Equipment used: Rolling walker (2  wheeled) Transfers: Sit to/from Stand Sit to Stand: Mod assist;+2 physical assistance;+2 safety/equipment         General transfer comment: requires cueing for hand placement with poor carryover; when correct placement and sequencing can transfer sit to stand with min assist +2 safety     Balance Overall balance assessment: Needs assistance Sitting-balance support: Feet supported;Bilateral upper extremity supported Sitting balance-Leahy Scale: Poor Sitting balance - Comments: reliant on UE support, min guard   Standing balance support: Bilateral upper extremity supported;During functional activity;Single extremity supported Standing balance-Leahy Scale: Poor Standing balance comment: relies on external and UE support, able to progress to 1 UE support with maximal encouragment                            ADL either performed or assessed with clinical judgement   ADL Overall ADL's : Needs assistance/impaired     Grooming: Wash/dry face;Minimal assistance;Standing Grooming Details (indicate cue type and reason): for balance at sink standing with 1 hand support                 Toilet Transfer: Moderate assistance;+2 for physical assistance;+2 for safety/equipment;Ambulation;RW Toilet Transfer Details (indicate cue type and reason): to [facility]  Toileting- Clothing Manipulation and Hygiene: Total assistance;+2 for physical assistance;+2 for safety/equipment;Sit to/from stand Toileting - Clothing Manipulation Details (indicate cue type and reason): total assist for toileting hygiene with 2nd person assist for balance standing; poor bowel control (voiding while walking)- requires hand over hand to attempt reaching buttocks during hygiene     Functional  mobility during ADLs: Moderate assistance;+2 for physical assistance;+2 for safety/equipment;Rolling walker;Cueing for safety;Cueing for sequencing General ADL Comments: pt limited by cognition, impaired balance, coordination       Vision       Perception     Praxis      Cognition Arousal/Alertness: Awake/alert Behavior During Therapy: WFL for tasks assessed/performed Overall Cognitive Status: Impaired/Different from baseline Area of Impairment: Memory;Following commands;Safety/judgement;Problem solving;Awareness                     Memory: Decreased short-term memory Following Commands: Follows one step commands with increased time;Follows multi-step commands inconsistently Safety/Judgement: Decreased awareness of safety;Decreased awareness of deficits Awareness: Intellectual Problem Solving: Slow processing;Decreased initiation;Difficulty sequencing;Requires verbal cues;Requires tactile cues General Comments: patient following simple commands, difficulty processing and sequencing ADL tasks, perseverating on washing his face throughout session; highly fearful of falling and incontinent of bowels but aware of incontinence        Exercises     Shoulder Instructions       General Comments VSS     Pertinent Vitals/ Pain       Pain Assessment: No/denies pain  Home Living                                          Prior Functioning/Environment              Frequency  Min 2X/week        Progress Toward Goals  OT Goals(current goals can now be found in the care plan section)  Progress towards OT goals: Progressing toward goals  Acute Rehab OT Goals Patient Stated Goal: To be able to take care of self  OT Goal Formulation: With patient/family  Plan Frequency remains appropriate;Discharge plan needs to be updated    Co-evaluation    PT/OT/SLP Co-Evaluation/Treatment: Yes Reason for Co-Treatment: For patient/therapist safety;To address functional/ADL transfers;Necessary to address cognition/behavior during functional activity PT goals addressed during session: Mobility/safety with mobility OT goals addressed during session: ADL's and self-care      AM-PAC  OT "6 Clicks" Daily Activity     Outcome Measure   Help from another person eating meals?: A Little Help from another person taking care of personal grooming?: A Little Help from another person toileting, which includes using toliet, bedpan, or urinal?: A Lot Help from another person bathing (including washing, rinsing, drying)?: A Lot Help from another person to put on and taking off regular upper body clothing?: A Lot Help from another person to put on and taking off regular lower body clothing?: A Lot 6 Click Score: 14    End of Session Equipment Utilized During Treatment: Gait belt;Rolling walker  OT Visit Diagnosis: Unsteadiness on feet (R26.81);Cognitive communication deficit (R41.841) Symptoms and signs involving cognitive functions: Cerebral infarction   Activity Tolerance Patient tolerated treatment well   Patient Left in chair;with call bell/phone within reach;with chair alarm set   Nurse Communication Mobility status;Precautions        Time: 4196-2229 OT Time Calculation (min): 29 min  Charges: OT General Charges $OT Visit: 1 Visit OT Treatments $Self Care/Home Management : 8-22 mins  Barry Brunner, OT Acute Rehabilitation Services Pager [phone] Office [phone]    Chancy Milroy 12/03/2019, 1:39 PM

## 2019-12-04 LAB — CBC
HCT: 35.8 % — ABNORMAL LOW (ref 39.0–52.0)
Hemoglobin: 11.6 g/dL — ABNORMAL LOW (ref 13.0–17.0)
MCH: 30.5 pg (ref 26.0–34.0)
MCHC: 32.4 g/dL (ref 30.0–36.0)
MCV: 94.2 fL (ref 80.0–100.0)
Platelets: 249 10*3/uL (ref 150–400)
RBC: 3.8 MIL/uL — ABNORMAL LOW (ref 4.22–5.81)
RDW: 13.5 % (ref 11.5–15.5)
WBC: 6.8 10*3/uL (ref 4.0–10.5)
nRBC: 0 % (ref 0.0–0.2)

## 2019-12-04 LAB — BASIC METABOLIC PANEL
Anion gap: 12 (ref 5–15)
BUN: 15 mg/dL (ref 8–23)
CO2: 25 mmol/L (ref 22–32)
Calcium: 8.6 mg/dL — ABNORMAL LOW (ref 8.9–10.3)
Chloride: 98 mmol/L (ref 98–111)
Creatinine, Ser: 0.93 mg/dL (ref 0.61–1.24)
GFR calc Af Amer: >60 mL/min (ref >60)
GFR calc non Af Amer: >60 mL/min (ref >60)
Glucose, Bld: 218 mg/dL — ABNORMAL HIGH (ref 70–99)
Potassium: 3.9 mmol/L (ref 3.5–5.1)
Sodium: 135 mmol/L (ref 135–145)

## 2019-12-04 LAB — GLUCOSE, CAPILLARY
Glucose-Capillary: 155 mg/dL — ABNORMAL HIGH (ref 70–99)
Glucose-Capillary: 177 mg/dL — ABNORMAL HIGH (ref 70–99)

## 2019-12-04 LAB — SARS CORONAVIRUS 2 BY RT PCR (HOSPITAL ORDER, PERFORMED IN ~~LOC~~ HOSPITAL LAB): SARS Coronavirus 2: NEGATIVE

## 2019-12-04 MED ORDER — INSULIN DETEMIR 100 UNIT/ML ~~LOC~~ SOLN: 15.0000 [IU] | Freq: Two times a day (BID) | SUBCUTANEOUS | 11 refills | Status: DC

## 2019-12-04 MED ORDER — LISINOPRIL 40 MG PO TABS: 40.0000 mg | ORAL_TABLET | Freq: Every day | ORAL | 1 refills | Status: DC

## 2019-12-04 MED ORDER — AMLODIPINE BESYLATE 10 MG PO TABS: 10.0000 mg | ORAL_TABLET | Freq: Every day | ORAL | 1 refills | Status: DC

## 2019-12-04 MED ORDER — ACETAMINOPHEN 325 MG PO TABS: 650.0000 mg | ORAL_TABLET | ORAL | 0 refills | Status: DC | PRN

## 2019-12-04 MED ORDER — ATORVASTATIN CALCIUM 20 MG PO TABS: 20.0000 mg | ORAL_TABLET | Freq: Every day | ORAL | 0 refills | Status: DC

## 2019-12-04 MED ORDER — ASPIRIN 81 MG PO CHEW: 81.0000 mg | CHEWABLE_TABLET | Freq: Every day | ORAL | 1 refills | Status: DC

## 2019-12-04 MED ORDER — METOPROLOL SUCCINATE ER 50 MG PO TB24: 50.0000 mg | ORAL_TABLET | Freq: Every day | ORAL | 1 refills | Status: DC

## 2019-12-04 MED ORDER — INSULIN ASPART 100 UNIT/ML ~~LOC~~ SOLN: 0.0000 [IU] | Freq: Every day | SUBCUTANEOUS | 11 refills | Status: DC

## 2019-12-04 MED ORDER — GLIMEPIRIDE 1 MG PO TABS: 1.0000 mg | ORAL_TABLET | Freq: Every day | ORAL | 1 refills | Status: DC

## 2019-12-04 MED ORDER — ASPIRIN 81 MG PO CHEW
81.0000 mg | CHEWABLE_TABLET | Freq: Every day | ORAL | Status: DC
  Administered 2019-12-04: 81 mg via ORAL
  Filled 2019-12-04: qty 1

## 2019-12-04 MED ORDER — INSULIN ASPART 100 UNIT/ML ~~LOC~~ SOLN: 0.0000 [IU] | Freq: Three times a day (TID) | SUBCUTANEOUS | 11 refills | Status: DC

## 2019-12-04 NOTE — Progress Notes (Signed)
SLP Cancellation Note  Patient Details Name: Manuel Chapman MRN: [id number] DOB: March 16, 1946   Cancelled treatment:       Reason Eval/Treat Not Completed: Patient declined, no reason specified Patient imminently discharging to SNF. Continue recommendations for further ST at that level of care.  Ed Rayson P. Jabarie Pop, M.S., CCC-SLP Speech-Language Pathologist Acute Rehabilitation Services Pager: [phone]   Susanne Borders [name] 12/04/2019, 12:51 PM

## 2019-12-04 NOTE — Progress Notes (Signed)
STROKE TEAM PROGRESS NOTE   INTERVAL HISTORY No family at bedside. Patient up in chair complaining that he wants to get back in bed. NAD. Patient to discharge to SNF today.  Vitals:   12/03/19 2012 12/03/19 2329 12/04/19 0348 12/04/19 0826  BP: (!) 158/69 (!) 102/29 (!) 144/67 (!) 141/62  Pulse: 73 78 72 74  Resp: 18 17 16 12   Temp: 98.9 F (37.2 C) 98.6 F (37 C) 98.6 F (37 C) 98.6 F (37 C)  TempSrc: Oral Oral  Oral  SpO2: 100% 97% 100% 100%  Weight:      Height:       CBC:  Recent Labs  Lab 11/28/19 2232 12/01/19 0239 12/03/19 0316 12/04/19 0253  WBC 8.2   < > 7.0 6.8  NEUTROABS 6.4  --   --   --   HGB 12.5*  13.3   < > 11.5* 11.6*  HCT 39.1  39.0   < > 35.3* 35.8*  MCV 93.3   < > 91.9 94.2  PLT 237   < > 241 249   < > = values in this interval not displayed.   Basic Metabolic Panel:  Recent Labs  Lab 12/03/19 0316 12/04/19 0253  NA 138 135  K 4.0 3.9  CL 101 98  CO2 27 25  GLUCOSE 86 218*  BUN 15 15  CREATININE 0.99 0.93  CALCIUM 8.7* 8.6*   Lipid Panel:  Recent Labs  Lab 11/28/19 2232  CHOL 157  TRIG 66  HDL 54  CHOLHDL 2.9  VLDL 13  LDLCALC 90   HgbA1c:  Recent Labs  Lab 11/28/19 2232  HGBA1C 13.3*   Urine Drug Screen:  Recent Labs  Lab 11/30/19 0950  LABOPIA NONE DETECTED  COCAINSCRNUR NONE DETECTED  LABBENZ NONE DETECTED  AMPHETMU NONE DETECTED  THCU NONE DETECTED  LABBARB NONE DETECTED    Alcohol Level  Recent Labs  Lab 11/28/19 2232  ETH <10    IMAGING past 24 hours No results found.  PHYSICAL EXAM  Temp:  [98.4 F (36.9 C)-98.9 F (37.2 C)] 98.6 F (37 C) (08/10 0826) Pulse Rate:  [66-81] 74 (08/10 0826) Resp:  [12-20] 12 (08/10 0826) BP: (102-166)/(29-71) 141/62 (08/10 0826) SpO2:  [97 %-100 %] 100 % (08/10 0826)  General - Well nourished, well developed, in no apparent distress.  Respiratory: no WOB  Cardiovascular - Regular rhythm and rate.    Mental Status -  Awake alert, oriented to  name/age/month/year. Able to follow commands. No dysarthria naming and repetition intact.  Cranial Nerves II - XII - II - Visual field intact OU. PERRL, EOEMI. Face symmetric.  Motor Strength - The patient's strength was normal in right UE and LE, however, LUE 4/5 proximal with pronator drift. LLE proximal 5/5 Bulk was normal and fasciculations were absent.    Motor Tone - Muscle tone normal.  Reflexes - 2+ biceps and patella Sensory - intact to  Light touch, temperature Coordination - The patient had normal movements in the hands with no ataxia or dysmetria, but slow action L>R.  Tremor was absent.  Gait and Station - deferred.   ASSESSMENT/PLAN [name] is a 74 y.o. male with history of hypertension and diabetes mellitus presents with slurred speech and facial droop. Fell at stroke onset.   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 L posterior vertex and posterior L parietal lobe IPH or SAH. ? Mets.   MRI w/w/o  L frontoparietal, caudate IPH w/ IVH. Punctate superior R temporal lobe infarct. L caudate head restricted diffusion.   2D Echo EF 55-60%. No source of embolus. LA mildly dilated.   LDL 90  HgbA1c 13.3  VTE prophylaxis - heparin subq  UDS - negative  No antithrombotic prior to admission, now on No antithrombotic given hemorrhage. Consider ASA once ICH near resolution.   Therapy recommendations:  SNF  Disposition:  SNF  Hypertension  Home meds:  Amlodipine 10, chlorthalidone 25, lisinopril 40, metoprolol 50 xl . Stable  . SBP goal to < 160   . Resume amlodpine 10, lisinopril 40 and metoprolol 50 . PRN labetolol . Long-term BP goal normotensive  Hyperlipidemia  Home meds:  lipitor 40  Hold statin in setting of acute  hemorrhage  LDL 90  Resume lipitor 20  Continue on discharge  Diabetes type II   Home meds:  Glimepiride 2, NPH 65u bid, metformin 1000 bid, actos 45  HgbA1c 13.3, goal < 7.0  DB RN following.  On levemir to 15 bid   Add glimepiride 1mg  daily  SSI sensitive q4h  CBG monitoring  Appreciate DM coordinator assistance   close PCP follow up for better DM control  Other Stroke Risk Factors  Advanced age  ETOH use, alcohol level <10, advised to drink no more than 2 drink(s) a day  Obesity, Body mass index is 36.51 kg/m., recommend weight loss, diet and exercise as appropriate   Other Active Problems  Pain L shoulder, knee and hip. Check XR knee neg. XR L hip w/ degenerative changes LS and both hips, no acute abnormality   Gout - on allopurinol  Hypokalemia 3.3 - replaced ->3.9->3.9->4.0  AKI Cre 1.2->1.36->1.09->0.86->0.99  Anemia - Hgb - 13.3->10.4->10.7->11.5   Hospital day # 5 , MSN, NP-C [facility] [phone]      To contact Stroke Continuity provider, please refer to [phone]. After hours, contact General Neurology

## 2019-12-04 NOTE — TOC Transition Note (Signed)
Transition of Care [facility]) - CM/SW Discharge Note   Patient Details  Name: Manuel Chapman MRN: [id number] Date of Birth: December 07, 1945  [facility] CM/SW Contact:  Kermit Balo, RN Phone Number: 12/04/2019, 12:56 PM   Clinical Narrative:    Pt discharging to [facility] today. Daughter, [name] is aware. Bedside RN updated and d/c packet at the desk.  Room: 102 B Number for report: [phone]   Final next level of care: Skilled Nursing Facility Barriers to Discharge: No Barriers Identified   Patient Goals and CMS Choice   CMS Medicare.gov Compare Post Acute Care list provided to:: Patient Represenative (must comment) Choice offered to / list presented to : Adult Children  Discharge Placement              Patient chooses bed at: [facility] Patient to be transferred to facility by: [facility] Name of family member notified: [name] Patient and family notified of of transfer: 12/04/19  Discharge Plan and Services In-house Referral: Clinical Social Work                                   Social Determinants of Health (SDOH) Interventions     Readmission Risk Interventions No flowsheet data found.

## 2019-12-04 NOTE — Progress Notes (Signed)
Transported by Sharin Mons to [facility], report called to [place]. No complaints, paperwork sent with.  Jana Hakim, RN

## 2019-12-04 NOTE — Discharge Summary (Addendum)
Stroke Discharge Summary  Patient ID: Manuel Chapman   MRN: [id number]      DOB: June 26, 1945  Date of Admission: 11/28/2019 Date of Discharge: 12/04/2019  Attending Physician:  Marvel Plan, MD, Stroke MD Consultant(s):    physical medicine Patient's PCP:  Burton Apley, MD  DISCHARGE DIAGNOSIS:  Active Problems:   ICH (intracerebral hemorrhage) (HCC)   Pressure injury of skin   Hyperglycemia   Hypokalemia   Acute blood loss anemia   Allergies as of 12/04/2019   No Known Allergies     Medication List    STOP taking these medications   chlorthalidone 25 MG tablet Commonly known as: HYGROTON   levofloxacin 750 MG tablet Commonly known as: LEVAQUIN   metFORMIN 1000 MG tablet Commonly known as: GLUCOPHAGE   NOVOLIN N [place]   pioglitazone 45 MG tablet Commonly known as: ACTOS     TAKE these medications   acetaminophen 325 MG tablet Commonly known as: TYLENOL Take 2 tablets (650 mg total) by mouth every 4 (four) hours as needed for mild pain (or temp > 37.5 C (99.5 F)). What changed:   when to take this  reasons to take this   allopurinol 300 MG tablet Commonly known as: ZYLOPRIM Take 300 mg by mouth daily.   amLODipine 10 MG tablet Commonly known as: NORVASC Take 1 tablet (10 mg total) by mouth daily. Start taking on: December 05, 2019   aspirin 81 MG chewable tablet Chew 1 tablet (81 mg total) by mouth daily.   atorvastatin 20 MG tablet Commonly known as: LIPITOR Take 1 tablet (20 mg total) by mouth daily. Start taking on: December 05, 2019 What changed:   medication strength  how much to take   glimepiride 1 MG tablet Commonly known as: AMARYL Take 1 tablet (1 mg total) by mouth daily. Start taking on: December 05, 2019 What changed:   medication strength  how much to take   insulin aspart 100 UNIT/ML injection Commonly known as: novoLOG Inject 0-5 Units into the skin at bedtime.   insulin aspart 100 UNIT/ML injection Commonly known as:  novoLOG Inject 0-9 Units into the skin 3 (three) times daily with meals.   insulin detemir 100 UNIT/ML injection Commonly known as: LEVEMIR Inject 0.15 mLs (15 Units total) into the skin 2 (two) times daily.   lisinopril 40 MG tablet Commonly known as: ZESTRIL Take 1 tablet (40 mg total) by mouth daily. Start taking on: December 05, 2019   metoprolol succinate 50 MG 24 hr tablet Commonly known as: TOPROL-XL Take 1 tablet (50 mg total) by mouth daily. Take with or immediately following a meal. Start taking on: December 05, 2019 What changed: additional instructions       LABORATORY STUDIES CBC    Component Value Date/Time   WBC 6.8 12/04/2019 0253   RBC 3.80 (L) 12/04/2019 0253   HGB 11.6 (L) 12/04/2019 0253   HGB 14.3 04/24/2008 1531   HCT 35.8 (L) 12/04/2019 0253   HCT 42.9 04/24/2008 1531   PLT 249 12/04/2019 0253   PLT 219 04/24/2008 1531   MCV 94.2 12/04/2019 0253   MCV 93.4 04/24/2008 1531   MCH 30.5 12/04/2019 0253   MCHC 32.4 12/04/2019 0253   RDW 13.5 12/04/2019 0253   RDW 17.0 (H) 04/24/2008 1531   LYMPHSABS 1.1 11/28/2019 2232   LYMPHSABS 5.6 (H) 04/24/2008 1531   MONOABS 0.6 11/28/2019 2232   MONOABS 0.5 04/24/2008 1531   EOSABS 0.0 11/28/2019 2232  EOSABS 0.0 04/24/2008 1531   BASOSABS 0.0 11/28/2019 2232   BASOSABS 0.0 04/24/2008 1531   CMP    Component Value Date/Time   NA 135 12/04/2019 0253   K 3.9 12/04/2019 0253   CL 98 12/04/2019 0253   CO2 25 12/04/2019 0253   GLUCOSE 218 (H) 12/04/2019 0253   BUN 15 12/04/2019 0253   CREATININE 0.93 12/04/2019 0253   CALCIUM 8.6 (L) 12/04/2019 0253   PROT 9.0 (H) 11/28/2019 2232   ALBUMIN 4.4 11/28/2019 2232   AST 35 11/28/2019 2232   ALT 16 11/28/2019 2232   ALKPHOS 103 11/28/2019 2232   BILITOT 0.8 11/28/2019 2232   GFRNONAA >60 12/04/2019 0253   GFRAA >60 12/04/2019 0253   COAGS Lab Results  Component Value Date   INR 0.9 11/28/2019   Lipid Panel    Component Value Date/Time   CHOL 157  11/28/2019 2232   TRIG 66 11/28/2019 2232   HDL 54 11/28/2019 2232   CHOLHDL 2.9 11/28/2019 2232   VLDL 13 11/28/2019 2232   LDLCALC 90 11/28/2019 2232   HgbA1C  Lab Results  Component Value Date   HGBA1C 13.3 (H) 11/28/2019   Urinalysis    Component Value Date/Time   COLORURINE YELLOW 05/03/2011 1754   APPEARANCEUR CLEAR 05/03/2011 1754   LABSPEC 1.022 05/03/2011 1754   PHURINE 5.0 05/03/2011 1754   GLUCOSEU NEGATIVE 05/03/2011 1754   HGBUR NEGATIVE 05/03/2011 1754   BILIRUBINUR NEGATIVE 05/03/2011 1754   KETONESUR NEGATIVE 05/03/2011 1754   PROTEINUR NEGATIVE 05/03/2011 1754   UROBILINOGEN 0.2 05/03/2011 1754   NITRITE NEGATIVE 05/03/2011 1754   LEUKOCYTESUR SMALL (A) 05/03/2011 1754   Urine Drug Screen     Component Value Date/Time   LABOPIA NONE DETECTED 11/30/2019 0950   COCAINSCRNUR NONE DETECTED 11/30/2019 0950   LABBENZ NONE DETECTED 11/30/2019 0950   AMPHETMU NONE DETECTED 11/30/2019 0950   THCU NONE DETECTED 11/30/2019 0950   LABBARB NONE DETECTED 11/30/2019 0950    Alcohol Level    Component Value Date/Time   ETH <10 11/28/2019 2232     SIGNIFICANT DIAGNOSTIC STUDIES CT Angio Head W or Wo Contrast  Result Date: 11/29/2019 CLINICAL DATA:  Left-sided weakness and facial droop EXAM: CT ANGIOGRAPHY HEAD AND NECK TECHNIQUE: Multidetector CT imaging of the head and neck was performed using the standard protocol during bolus administration of intravenous contrast. Multiplanar CT image reconstructions and MIPs were obtained to evaluate the vascular anatomy. Carotid stenosis measurements (when applicable) are obtained utilizing NASCET criteria, using the distal internal carotid diameter as the denominator. CONTRAST:  42mL OMNIPAQUE IOHEXOL 350 MG/ML SOLN COMPARISON:  None. FINDINGS: CT HEAD FINDINGS Brain: There is intraparenchymal hemorrhage along the left caudate tail with extension into the left lateral ventricle and layering in both occipital horns. There is also  a small focus of intraparenchymal or subarachnoid hemorrhage at the left posterior vertex (series 3, image 30). There is hypoattenuation of the periventricular white matter, most commonly indicating chronic ischemic microangiopathy. Skull: The visualized skull base, calvarium and extracranial soft tissues are normal. Sinuses/Orbits: No fluid levels or advanced mucosal thickening of the visualized paranasal sinuses. No mastoid or middle ear effusion. The orbits are normal. CTA NECK FINDINGS SKELETON: There is no bony spinal canal stenosis. No lytic or blastic lesion. OTHER NECK: Normal pharynx, larynx and major salivary glands. No cervical lymphadenopathy. Unremarkable thyroid gland. UPPER CHEST: No pneumothorax or pleural effusion. No nodules or masses. AORTIC ARCH: There is mild calcific atherosclerosis of the aortic arch.  There is no aneurysm, dissection or hemodynamically significant stenosis of the visualized portion of the aorta. Conventional 3 vessel aortic branching pattern. The visualized proximal subclavian arteries are widely patent. RIGHT CAROTID SYSTEM: Normal without aneurysm, dissection or stenosis. LEFT CAROTID SYSTEM: Normal without aneurysm, dissection or stenosis. VERTEBRAL ARTERIES: Left dominant configuration. Both origins are clearly patent. There is no dissection, occlusion or flow-limiting stenosis to the skull base (V1-V3 segments). CTA HEAD FINDINGS POSTERIOR CIRCULATION: --Vertebral arteries: Normal V4 segments. --Inferior cerebellar arteries: Normal. --Basilar artery: Normal. --Superior cerebellar arteries: Normal. --Posterior cerebral arteries (PCA): Normal. ANTERIOR CIRCULATION: --Intracranial internal carotid arteries: Normal. --Anterior cerebral arteries (ACA): Normal. Both A1 segments are present. Patent anterior communicating artery (a-comm). --Middle cerebral arteries (MCA): Normal. VENOUS SINUSES: As permitted by contrast timing, patent. ANATOMIC VARIANTS: None Review of the MIP  images confirms the above findings. IMPRESSION: 1. Intraparenchymal hemorrhage along the left caudate tail with extension into the left lateral ventricle and layering in both occipital horns. 2. Small focus of intraparenchymal or subarachnoid hemorrhage at the left posterior vertex and the posterior left parietal lobe. The findings together raise suspicion for hemorrhagic metastatic disease. MRI of the brain with and without contrast is recommended. 3. No intracranial arterial occlusion or high-grade stenosis. 4. Aortic Atherosclerosis (ICD10-I70.0). These results were called by telephone at the time of interpretation on 11/29/2019 at 12:16 am to provider Dr. Clayborne Dana, Who verbally acknowledged these results. Electronically Signed   By: Deatra Robinson M.D.   On: 11/29/2019 00:17   DG Chest 2 View  Result Date: 11/28/2019 CLINICAL DATA:  Shortness of breath EXAM: CHEST - 2 VIEW COMPARISON:  03/26/2015 FINDINGS: Shallow inspiration. Heart size and pulmonary vascularity are normal. Lungs are clear. No pleural effusions. No pneumothorax. Degenerative changes in the spine. Calcification of the aorta. IMPRESSION: No active cardiopulmonary disease. Electronically Signed   By: Burman Nieves M.D.   On: 11/28/2019 23:07   DG Knee 1-2 Views Left  Result Date: 11/29/2019 CLINICAL DATA:  Fall with left knee pain. EXAM: LEFT KNEE - 1-2 VIEW COMPARISON:  None. FINDINGS: No evidence of fracture, dislocation, or definite joint effusion. IMPRESSION: Negative for fracture. Electronically Signed   By: Marnee Spring M.D.   On: 11/29/2019 11:06   CT Angio Neck W and/or Wo Contrast  Result Date: 11/29/2019 CLINICAL DATA:  Left-sided weakness and facial droop EXAM: CT ANGIOGRAPHY HEAD AND NECK TECHNIQUE: Multidetector CT imaging of the head and neck was performed using the standard protocol during bolus administration of intravenous contrast. Multiplanar CT image reconstructions and MIPs were obtained to evaluate the vascular  anatomy. Carotid stenosis measurements (when applicable) are obtained utilizing NASCET criteria, using the distal internal carotid diameter as the denominator. CONTRAST:  80mL OMNIPAQUE IOHEXOL 350 MG/ML SOLN COMPARISON:  None. FINDINGS: CT HEAD FINDINGS Brain: There is intraparenchymal hemorrhage along the left caudate tail with extension into the left lateral ventricle and layering in both occipital horns. There is also a small focus of intraparenchymal or subarachnoid hemorrhage at the left posterior vertex (series 3, image 30). There is hypoattenuation of the periventricular white matter, most commonly indicating chronic ischemic microangiopathy. Skull: The visualized skull base, calvarium and extracranial soft tissues are normal. Sinuses/Orbits: No fluid levels or advanced mucosal thickening of the visualized paranasal sinuses. No mastoid or middle ear effusion. The orbits are normal. CTA NECK FINDINGS SKELETON: There is no bony spinal canal stenosis. No lytic or blastic lesion. OTHER NECK: Normal pharynx, larynx and major salivary glands. No cervical lymphadenopathy. Unremarkable  thyroid gland. UPPER CHEST: No pneumothorax or pleural effusion. No nodules or masses. AORTIC ARCH: There is mild calcific atherosclerosis of the aortic arch. There is no aneurysm, dissection or hemodynamically significant stenosis of the visualized portion of the aorta. Conventional 3 vessel aortic branching pattern. The visualized proximal subclavian arteries are widely patent. RIGHT CAROTID SYSTEM: Normal without aneurysm, dissection or stenosis. LEFT CAROTID SYSTEM: Normal without aneurysm, dissection or stenosis. VERTEBRAL ARTERIES: Left dominant configuration. Both origins are clearly patent. There is no dissection, occlusion or flow-limiting stenosis to the skull base (V1-V3 segments). CTA HEAD FINDINGS POSTERIOR CIRCULATION: --Vertebral arteries: Normal V4 segments. --Inferior cerebellar arteries: Normal. --Basilar artery:  Normal. --Superior cerebellar arteries: Normal. --Posterior cerebral arteries (PCA): Normal. ANTERIOR CIRCULATION: --Intracranial internal carotid arteries: Normal. --Anterior cerebral arteries (ACA): Normal. Both A1 segments are present. Patent anterior communicating artery (a-comm). --Middle cerebral arteries (MCA): Normal. VENOUS SINUSES: As permitted by contrast timing, patent. ANATOMIC VARIANTS: None Review of the MIP images confirms the above findings. IMPRESSION: 1. Intraparenchymal hemorrhage along the left caudate tail with extension into the left lateral ventricle and layering in both occipital horns. 2. Small focus of intraparenchymal or subarachnoid hemorrhage at the left posterior vertex and the posterior left parietal lobe. The findings together raise suspicion for hemorrhagic metastatic disease. MRI of the brain with and without contrast is recommended. 3. No intracranial arterial occlusion or high-grade stenosis. 4. Aortic Atherosclerosis (ICD10-I70.0). These results were called by telephone at the time of interpretation on 11/29/2019 at 12:16 am to provider Dr. Clayborne Dana, Who verbally acknowledged these results. Electronically Signed   By: Deatra Robinson M.D.   On: 11/29/2019 00:17   MR Brain W and Wo Contrast  Result Date: 11/29/2019 CLINICAL DATA:  Slurred speech and facial droop. EXAM: MRI HEAD WITHOUT AND WITH CONTRAST TECHNIQUE: Multiplanar, multiecho pulse sequences of the brain and surrounding structures were obtained without and with intravenous contrast. CONTRAST:  9mL GADAVIST GADOBUTROL 1 MMOL/ML IV SOLN COMPARISON:  CTA of the head neck from yesterday FINDINGS: Brain: Cluster of small hemorrhages at the left caudate body/tail measuring up to 6 mm individually. There is regional diffusion hyperintensity greater than expected for artifact. Even smaller areas of left frontal parietal convexity hemorrhage. There is a punctate focus of restricted diffusion along the upper right temporal lobe.  Small volume intraventricular hemorrhage layering in the occipital horns. No superimposed enhancement. Severe chronic small vessel ischemia with confluent FLAIR hyperintensity in the cerebral white matter and pons. No swelling or masslike finding. Remote micro hemorrhages in the bilateral inferior frontal lobes. Borderline dural thickening that is diffuse. No brain sagging or nodularity. Vascular: Normal flow voids and vascular enhancement Skull and upper cervical spine: Normal marrow signal Sinuses/Orbits: Negative IMPRESSION: 1. Known left frontal parietal, left caudate, and intraventricular hemorrhage without interval progression. 2. Although susceptibility artifact likely contributes, there appears to be true restricted diffusion at the left caudate. There is also a punctate acute infarct at the superior right temporal lobe. Overall, favor sites of hemorrhage are related to underlying infarcts. No masslike enhancement. 3. Severe chronic small vessel ischemia. Electronically Signed   By: Marnee Spring M.D.   On: 11/29/2019 05:06   ECHOCARDIOGRAM COMPLETE  Result Date: 11/29/2019    ECHOCARDIOGRAM REPORT   Patient Name:   Manuel Chapman Date of Exam: 11/29/2019 Medical Rec #:  [id number]      Height:       66.0 in Accession #:    [id number]     Weight:  205.0 lb Date of Birth:  11-13-1945      BSA:          2.021 m Patient Age:    74 years       BP:           143/77 mmHg Patient Gender: M              HR:           80 bpm. Exam Location:  Inpatient Procedure: 2D Echo and Intracardiac Opacification Agent Indications:    Stroke 434.91 / I163.9  History:        Patient has no prior history of Echocardiogram examinations.                 Risk Factors:Hypertension and Diabetes.  Sonographer:    Leta Junglingiffany Cooper RDCS Referring Phys: [id number] PRAMOD S SETHI IMPRESSIONS  1. Hypokinesis of the basal inferolateral wall; overall normal LV function; grade 1 diastolic dysfunction; moderate AS (mean gradient 24 mmHg); mild  LAE.  2. Left ventricular ejection fraction, by estimation, is 55 to 60%. The left ventricle has normal function. The left ventricle demonstrates regional wall motion abnormalities (see scoring diagram/findings for description). Left ventricular diastolic parameters are consistent with Grade I diastolic dysfunction (impaired relaxation).  3. Right ventricular systolic function is normal. The right ventricular size is normal.  4. Left atrial size was mildly dilated.  5. The mitral valve is normal in structure. Trivial mitral valve regurgitation. No evidence of mitral stenosis.  6. The aortic valve has an indeterminant number of cusps. Aortic valve regurgitation is not visualized. Moderate aortic valve stenosis. FINDINGS  Left Ventricle: Left ventricular ejection fraction, by estimation, is 55 to 60%. The left ventricle has normal function. The left ventricle demonstrates regional wall motion abnormalities. Definity contrast agent was given IV to delineate the left ventricular endocardial borders. The left ventricular internal cavity size was normal in size. There is no left ventricular hypertrophy. Left ventricular diastolic parameters are consistent with Grade I diastolic dysfunction (impaired relaxation). Right Ventricle: The right ventricular size is normal.Right ventricular systolic function is normal. Left Atrium: Left atrial size was mildly dilated. Right Atrium: Right atrial size was normal in size. Pericardium: There is no evidence of pericardial effusion. Mitral Valve: The mitral valve is normal in structure. Normal mobility of the mitral valve leaflets. Trivial mitral valve regurgitation. No evidence of mitral valve stenosis. Tricuspid Valve: The tricuspid valve is normal in structure. Tricuspid valve regurgitation is trivial. No evidence of tricuspid stenosis. Aortic Valve: The aortic valve has an indeterminant number of cusps. Aortic valve regurgitation is not visualized. Moderate aortic stenosis is  present. Aortic valve mean gradient measures 23.7 mmHg. Aortic valve peak gradient measures 45.3 mmHg. Aortic valve area, by VTI measures 0.86 cm. Pulmonic Valve: The pulmonic valve was normal in structure. Pulmonic valve regurgitation is not visualized. No evidence of pulmonic stenosis. Aorta: The aortic root is normal in size and structure. Venous: The inferior vena cava was not well visualized.  Additional Comments: Hypokinesis of the basal inferolateral wall; overall normal LV function; grade 1 diastolic dysfunction; moderate AS (mean gradient 24 mmHg); mild LAE.  LEFT VENTRICLE PLAX 2D LVIDd:         5.00 cm  Diastology LVIDs:         3.30 cm  LV e' lateral:   6.42 cm/s LV PW:         1.10 cm  LV E/e' lateral: 10.8 LV IVS:  1.10 cm  LV e' medial:    5.33 cm/s LVOT diam:     1.80 cm  LV E/e' medial:  13.0 LV SV:         62 LV SV Index:   31 LVOT Area:     2.54 cm  RIGHT VENTRICLE RV S prime:     15.70 cm/s TAPSE (M-mode): 2.0 cm LEFT ATRIUM             Index       RIGHT ATRIUM           Index LA diam:        4.00 cm 1.98 cm/m  RA Area:     14.20 cm LA Vol (A2C):   48.8 ml 24.15 ml/m RA Volume:   35.60 ml  17.62 ml/m LA Vol (A4C):   69.9 ml 34.59 ml/m LA Biplane Vol: 63.3 ml 31.32 ml/m  AORTIC VALVE AV Area (Vmax):    0.84 cm AV Area (Vmean):   1.00 cm AV Area (VTI):     0.86 cm AV Vmax:           336.40 cm/s AV Vmean:          205.800 cm/s AV VTI:            0.726 m AV Peak Grad:      45.3 mmHg AV Mean Grad:      23.7 mmHg LVOT Vmax:         111.00 cm/s LVOT Vmean:        80.700 cm/s LVOT VTI:          0.245 m LVOT/AV VTI ratio: 0.34  AORTA Ao Root diam: 3.00 cm MITRAL VALVE MV Area (PHT): 2.62 cm    SHUNTS MV Decel Time: 289 msec    Systemic VTI:  0.24 m MV E velocity: 69.40 cm/s  Systemic Diam: 1.80 cm MV A velocity: 66.80 cm/s MV E/A ratio:  1.04 Olga Millers MD Electronically signed by Olga Millers MD Signature Date/Time: 11/29/2019/1:22:46 PM    Final    DG HIP PORT UNILAT WITH PELVIS  1V LEFT  Result Date: 11/29/2019 CLINICAL DATA:  Fall. EXAM: DG HIP (WITH OR WITHOUT PELVIS) 1V PORT LEFT COMPARISON:  CT 05/03/2011. FINDINGS: Contrast noted in the bladder from prior CT. Mild degenerative change lumbar spine and both hips. Well-circumscribed bony densities noted adjacent to the right ischium and right greater trochanter most likely from old injury. No acute bony or joint abnormality identified. No evidence of fracture or dislocation. IMPRESSION: Mild degenerative changes lumbar spine and both hips. Well-circumscribed bony densities noted adjacent to the right ischium and right greater trochanter most likely related to old injury. No acute abnormality identified. Electronically Signed   By: Maisie Fus  Register   On: 11/29/2019 11:08      HISTORY OF PRESENT ILLNESS 74 y.o. male with past medical history significant for hypertension, diabetes mellitus presents to the emergency department after daughter noticed slurred speech and having facial droop.  Patient had a fall yesterday 11:00 AM.  She went to check on him today and EMS was called.  Noted to have elevated blood sugar.  EMS was called second time due to worsening slurred speech and increased left facial droop.. Repeat CBG was 62, improved after oral food.  HOSPITAL COURSE Manuel Chapman is a 74 y.o. male with history of hypertension and diabetes mellitus presents with slurred speech and facial droop. Fell at stroke onset.   Stroke: punctate superior  R temporal lobe ICH with IVH - small  L frontoparietal and CR ICH w/ small IVH - likely all ischemic infarcts w/ hemorrhagic transformation    CTA head & neck L caudate tail ICH w/ IVH L lateral ventricle w/ blood in both occipital horns. L posterior vertex and posterior L parietal lobe IPH or SAH. ? Mets.   MRI w/w/o  L frontoparietal, caudate IPH w/ IVH. Punctate superior R temporal lobe infarct. L caudate head restricted diffusion.   2D Echo EF 55-60%. No source of embolus.  LA mildly dilated.   LDL 90  HgbA1c 13.3  VTE prophylaxis - heparin subq  UDS - negative  No antithrombotic prior to admission, was on No antithrombotic given hemorrhage. Now on ASA 81 on discharge.   Therapy recommendations:  SNF  Disposition:  SNF  Hypertension  Home meds:  Amlodipine 10, chlorthalidone 25, lisinopril 40, metoprolol 50 xl  Stable   SBP goal to < 160    Resume amlodpine 10, lisinopril 40 and metoprolol 50  PRN labetolol  Long-term BP goal normotensive  Hyperlipidemia  Home meds:  lipitor 40  Hold statin in setting of acute hemorrhage  LDL 90  Resume lipitor 20  Continue on discharge  Diabetes type II   Home meds:  Glimepiride 2, NPH 65u bid, metformin 1000 bid, actos 45  HgbA1c 13.3, goal < 7.0  DB RN following.  On levemir to 15 bid   Add glimepiride 1mg  daily  SSI sensitive q4h  CBG monitoring  Appreciate DM coordinator assistance   close PCP follow up for better DM control  Other Stroke Risk Factors  Advanced age  ETOH use, alcohol level <10, advised to drink no more than 2 drink(s) a day  Obesity, Body mass index is 36.51 kg/m., recommend weight loss, diet and exercise as appropriate   Other Active Problems  Pain L shoulder, knee and hip. Check XR knee neg. XR L hip w/ degenerative changes LS and both hips, no acute abnormality   Gout - on allopurinol  Hypokalemia 3.3 - replaced ->3.9->3.9->4.0->3.9  AKI Cre 1.2->1.36->1.09->0.86->0.99->0.93  Anemia - Hgb - 13.3->10.4->10.7->11.5  RN Pressure Injury Documentation: Pressure Injury 11/30/19 Sacrum Medial Stage 2 -  Partial thickness loss of dermis presenting as a shallow open injury with a red, pink wound bed without slough. medial, left/right sacral area (Active)  11/30/19 0800  Location: Sacrum  Location Orientation: Medial  Staging: Stage 2 -  Partial thickness loss of dermis presenting as a shallow open injury with a red, pink wound bed without  slough.  Wound Description (Comments): medial, left/right sacral area  Present on Admission: Yes     DISCHARGE EXAM Blood pressure (!) 141/62, pulse 74, temperature 98.6 F (37 C), temperature source Oral, resp. rate 12, height 5\' 6"  (1.676 m), weight 102.6 kg, SpO2 100 %. General - Well nourished, well developed, in no apparent distress.  Respiratory: no WOB  Cardiovascular - Regular rhythm and rate.    Mental Status -  Awake alert, oriented to name/age/month/year. Able to follow commands. No dysarthria naming and repetition intact.  Cranial Nerves II - XII - II - Visual field intact OU. PERRL, EOEMI. Face symmetric.  Motor Strength - The patient's strength was normal in right UE and LE, however, LUE 4/5 proximal with pronator drift. LLE proximal 5/5 Bulk was normal and fasciculations were absent.    Motor Tone - Muscle tone normal.  Reflexes - 2+ biceps and patella Sensory - intact to  Light touch,  temperature Coordination - The patient had normal movements in the hands with no ataxia or dysmetria, but slow action L>R.  Tremor was absent.  Gait and Station - deferred.  Discharge Diet       Diet   Diet heart healthy/carb modified Room service appropriate? Yes; Fluid consistency: Thin   liquids  DISCHARGE PLAN  Disposition:  SNF  aspirin 81 mg daily for secondary stroke prevention   Ongoing stroke risk factor control by Primary Care Physician at time of discharge  Follow-up PCP Burton Apley, MD in 2 weeks.  Follow-up in [facility] in 4 weeks, office to schedule an appointment.   35 minutes were spent preparing discharge.  Valentina Lucks, MSN, NP-C [facility] [phone]  ATTENDING NOTE: I reviewed above note and agree with the assessment and plan. Pt was seen and examined. Neuro stable, no complains. Ready to d/c to SNF  Marvel Plan, MD PhD Stroke Neurology 12/05/2019 12:52 AM

## 2019-12-05 ENCOUNTER — Other Ambulatory Visit: Payer: Self-pay | Admitting: Neurology

## 2019-12-05 DIAGNOSIS — I611 Nontraumatic intracerebral hemorrhage in hemisphere, cortical: Secondary | ICD-10-CM

## 2020-01-22 ENCOUNTER — Ambulatory Visit
Admission: RE | Admit: 2020-01-22 | Discharge: 2020-01-22 | Disposition: A | Discharge status: Home / Self Care | Payer: Medicare HMO | Source: Ambulatory Visit | Attending: Internal Medicine | Admitting: Internal Medicine

## 2020-01-22 ENCOUNTER — Other Ambulatory Visit: Payer: Self-pay | Admitting: Internal Medicine

## 2020-01-22 DIAGNOSIS — M25562 Pain in left knee: Secondary | ICD-10-CM

## 2020-01-28 ENCOUNTER — Ambulatory Visit: Payer: Self-pay | Admitting: Neurology

## 2020-01-28 ENCOUNTER — Encounter: Payer: Self-pay | Admitting: Neurology

## 2021-05-05 ENCOUNTER — Inpatient Hospital Stay (HOSPITAL_COMMUNITY)
Admission: EM | Admit: 2021-05-05 | Discharge: 2021-05-09 | DRG: 065 | Disposition: A | Discharge status: Rehabilitation Facility | Payer: Medicare HMO | Attending: Internal Medicine | Admitting: Internal Medicine

## 2021-05-05 ENCOUNTER — Inpatient Hospital Stay (HOSPITAL_COMMUNITY): Payer: Medicare HMO

## 2021-05-05 ENCOUNTER — Other Ambulatory Visit: Payer: Self-pay

## 2021-05-05 ENCOUNTER — Emergency Department (HOSPITAL_COMMUNITY): Payer: Medicare HMO

## 2021-05-05 ENCOUNTER — Encounter (HOSPITAL_COMMUNITY): Payer: Self-pay | Admitting: Internal Medicine

## 2021-05-05 DIAGNOSIS — I10 Essential (primary) hypertension: Secondary | ICD-10-CM | POA: Diagnosis present

## 2021-05-05 DIAGNOSIS — R42 Dizziness and giddiness: Secondary | ICD-10-CM | POA: Diagnosis present

## 2021-05-05 DIAGNOSIS — I5032 Chronic diastolic (congestive) heart failure: Secondary | ICD-10-CM | POA: Diagnosis present

## 2021-05-05 DIAGNOSIS — Z6831 Body mass index (BMI) 31.0-31.9, adult: Secondary | ICD-10-CM | POA: Diagnosis not present

## 2021-05-05 DIAGNOSIS — E119 Type 2 diabetes mellitus without complications: Secondary | ICD-10-CM

## 2021-05-05 DIAGNOSIS — E11649 Type 2 diabetes mellitus with hypoglycemia without coma: Secondary | ICD-10-CM | POA: Diagnosis not present

## 2021-05-05 DIAGNOSIS — R270 Ataxia, unspecified: Secondary | ICD-10-CM | POA: Diagnosis present

## 2021-05-05 DIAGNOSIS — I6389 Other cerebral infarction: Secondary | ICD-10-CM | POA: Diagnosis not present

## 2021-05-05 DIAGNOSIS — I429 Cardiomyopathy, unspecified: Secondary | ICD-10-CM | POA: Diagnosis present

## 2021-05-05 DIAGNOSIS — I63441 Cerebral infarction due to embolism of right cerebellar artery: Secondary | ICD-10-CM | POA: Diagnosis present

## 2021-05-05 DIAGNOSIS — Z87891 Personal history of nicotine dependence: Secondary | ICD-10-CM

## 2021-05-05 DIAGNOSIS — Z7984 Long term (current) use of oral hypoglycemic drugs: Secondary | ICD-10-CM

## 2021-05-05 DIAGNOSIS — M7522 Bicipital tendinitis, left shoulder: Secondary | ICD-10-CM | POA: Diagnosis not present

## 2021-05-05 DIAGNOSIS — E1165 Type 2 diabetes mellitus with hyperglycemia: Secondary | ICD-10-CM | POA: Diagnosis present

## 2021-05-05 DIAGNOSIS — E669 Obesity, unspecified: Secondary | ICD-10-CM | POA: Diagnosis present

## 2021-05-05 DIAGNOSIS — Z20822 Contact with and (suspected) exposure to covid-19: Secondary | ICD-10-CM | POA: Diagnosis present

## 2021-05-05 DIAGNOSIS — G463 Brain stem stroke syndrome: Secondary | ICD-10-CM | POA: Diagnosis not present

## 2021-05-05 DIAGNOSIS — M109 Gout, unspecified: Secondary | ICD-10-CM | POA: Diagnosis present

## 2021-05-05 DIAGNOSIS — E785 Hyperlipidemia, unspecified: Secondary | ICD-10-CM | POA: Diagnosis present

## 2021-05-05 DIAGNOSIS — T466X6A Underdosing of antihyperlipidemic and antiarteriosclerotic drugs, initial encounter: Secondary | ICD-10-CM | POA: Diagnosis present

## 2021-05-05 DIAGNOSIS — Z7982 Long term (current) use of aspirin: Secondary | ICD-10-CM

## 2021-05-05 DIAGNOSIS — I350 Nonrheumatic aortic (valve) stenosis: Secondary | ICD-10-CM | POA: Diagnosis present

## 2021-05-05 DIAGNOSIS — E1159 Type 2 diabetes mellitus with other circulatory complications: Secondary | ICD-10-CM | POA: Diagnosis not present

## 2021-05-05 DIAGNOSIS — M7582 Other shoulder lesions, left shoulder: Secondary | ICD-10-CM | POA: Diagnosis not present

## 2021-05-05 DIAGNOSIS — R29704 NIHSS score 4: Secondary | ICD-10-CM | POA: Diagnosis present

## 2021-05-05 DIAGNOSIS — Z8673 Personal history of transient ischemic attack (TIA), and cerebral infarction without residual deficits: Secondary | ICD-10-CM | POA: Diagnosis not present

## 2021-05-05 DIAGNOSIS — I110 Hypertensive heart disease with heart failure: Secondary | ICD-10-CM | POA: Diagnosis present

## 2021-05-05 DIAGNOSIS — I639 Cerebral infarction, unspecified: Secondary | ICD-10-CM | POA: Diagnosis present

## 2021-05-05 DIAGNOSIS — Z794 Long term (current) use of insulin: Secondary | ICD-10-CM

## 2021-05-05 DIAGNOSIS — I6300 Cerebral infarction due to thrombosis of unspecified precerebral artery: Secondary | ICD-10-CM | POA: Diagnosis not present

## 2021-05-05 DIAGNOSIS — R7309 Other abnormal glucose: Secondary | ICD-10-CM | POA: Diagnosis not present

## 2021-05-05 DIAGNOSIS — R112 Nausea with vomiting, unspecified: Secondary | ICD-10-CM

## 2021-05-05 DIAGNOSIS — Z79899 Other long term (current) drug therapy: Secondary | ICD-10-CM | POA: Diagnosis not present

## 2021-05-05 DIAGNOSIS — I6330 Cerebral infarction due to thrombosis of unspecified cerebral artery: Secondary | ICD-10-CM | POA: Diagnosis not present

## 2021-05-05 DIAGNOSIS — R471 Dysarthria and anarthria: Secondary | ICD-10-CM | POA: Diagnosis present

## 2021-05-05 DIAGNOSIS — R7989 Other specified abnormal findings of blood chemistry: Secondary | ICD-10-CM | POA: Diagnosis not present

## 2021-05-05 DIAGNOSIS — E1169 Type 2 diabetes mellitus with other specified complication: Secondary | ICD-10-CM | POA: Diagnosis not present

## 2021-05-05 LAB — COMPREHENSIVE METABOLIC PANEL
ALT: 10 U/L (ref 0–44)
AST: 28 U/L (ref 15–41)
Albumin: 2.4 g/dL — ABNORMAL LOW (ref 3.5–5.0)
Alkaline Phosphatase: 98 U/L (ref 38–126)
Anion gap: 11 (ref 5–15)
BUN: 12 mg/dL (ref 8–23)
CO2: 22 mmol/L (ref 22–32)
Calcium: 7.6 mg/dL — ABNORMAL LOW (ref 8.9–10.3)
Chloride: 101 mmol/L (ref 98–111)
Creatinine, Ser: 0.79 mg/dL (ref 0.61–1.24)
GFR, Estimated: >60 mL/min (ref >60)
Glucose, Bld: 300 mg/dL — ABNORMAL HIGH (ref 70–99)
Potassium: 4 mmol/L (ref 3.5–5.1)
Sodium: 134 mmol/L — ABNORMAL LOW (ref 135–145)
Total Bilirubin: 1.4 mg/dL — ABNORMAL HIGH (ref 0.3–1.2)
Total Protein: 5.7 g/dL — ABNORMAL LOW (ref 6.5–8.1)

## 2021-05-05 LAB — RAPID URINE DRUG SCREEN, HOSP PERFORMED
Amphetamines: NOT DETECTED
Barbiturates: NOT DETECTED
Benzodiazepines: NOT DETECTED
Cocaine: NOT DETECTED
Opiates: NOT DETECTED
Tetrahydrocannabinol: NOT DETECTED

## 2021-05-05 LAB — CBC WITH DIFFERENTIAL/PLATELET
Abs Immature Granulocytes: 0.05 10*3/uL (ref 0.00–0.07)
Basophils Absolute: 0 10*3/uL (ref 0.0–0.1)
Basophils Relative: 0 %
Eosinophils Absolute: 0.1 10*3/uL (ref 0.0–0.5)
Eosinophils Relative: 0 %
HCT: 31.5 % — ABNORMAL LOW (ref 39.0–52.0)
Hemoglobin: 10.5 g/dL — ABNORMAL LOW (ref 13.0–17.0)
Immature Granulocytes: 0 %
Lymphocytes Relative: 20 %
Lymphs Abs: 2.3 10*3/uL (ref 0.7–4.0)
MCH: 29.7 pg (ref 26.0–34.0)
MCHC: 33.3 g/dL (ref 30.0–36.0)
MCV: 89.2 fL (ref 80.0–100.0)
Monocytes Absolute: 0.4 10*3/uL (ref 0.1–1.0)
Monocytes Relative: 3 %
Neutro Abs: 8.6 10*3/uL — ABNORMAL HIGH (ref 1.7–7.7)
Neutrophils Relative %: 77 %
Platelets: 282 10*3/uL (ref 150–400)
RBC: 3.53 MIL/uL — ABNORMAL LOW (ref 4.22–5.81)
RDW: 14.6 % (ref 11.5–15.5)
WBC: 11.4 10*3/uL — ABNORMAL HIGH (ref 4.0–10.5)
nRBC: 0 % (ref 0.0–0.2)

## 2021-05-05 LAB — URINALYSIS, ROUTINE W REFLEX MICROSCOPIC
Bilirubin Urine: NEGATIVE
Glucose, UA: >=500 mg/dL — AB
Ketones, ur: NEGATIVE mg/dL
Leukocytes,Ua: NEGATIVE
Nitrite: NEGATIVE
Protein, ur: >300 mg/dL — AB
Specific Gravity, Urine: 1.02 (ref 1.005–1.030)
pH: 7 (ref 5.0–8.0)

## 2021-05-05 LAB — I-STAT VENOUS BLOOD GAS, ED
Acid-Base Excess: 3 mmol/L — ABNORMAL HIGH (ref 0.0–2.0)
Bicarbonate: 27 mmol/L (ref 20.0–28.0)
Calcium, Ion: 1.05 mmol/L — ABNORMAL LOW (ref 1.15–1.40)
HCT: 37 % — ABNORMAL LOW (ref 39.0–52.0)
Hemoglobin: 12.6 g/dL — ABNORMAL LOW (ref 13.0–17.0)
O2 Saturation: 86 %
Potassium: 3.7 mmol/L (ref 3.5–5.1)
Sodium: 136 mmol/L (ref 135–145)
TCO2: 28 mmol/L (ref 22–32)
pCO2, Ven: 39.3 mmHg — ABNORMAL LOW (ref 44.0–60.0)
pH, Ven: 7.445 — ABNORMAL HIGH (ref 7.250–7.430)
pO2, Ven: 50 mmHg — ABNORMAL HIGH (ref 32.0–45.0)

## 2021-05-05 LAB — BETA-HYDROXYBUTYRIC ACID: Beta-Hydroxybutyric Acid: 0.47 mmol/L — ABNORMAL HIGH (ref 0.05–0.27)

## 2021-05-05 LAB — ECHOCARDIOGRAM COMPLETE
AR max vel: 0.57 cm2
AV Area VTI: 0.58 cm2
AV Area mean vel: 0.64 cm2
AV Mean grad: 30.9 mmHg
AV Peak grad: 65.2 mmHg
Ao pk vel: 4.04 m/s
Area-P 1/2: 4.36 cm2
S' Lateral: 3.8 cm

## 2021-05-05 LAB — RESP PANEL BY RT-PCR (FLU A&B, COVID) ARPGX2
Influenza A by PCR: NEGATIVE
Influenza B by PCR: NEGATIVE
SARS Coronavirus 2 by RT PCR: NEGATIVE

## 2021-05-05 LAB — URINALYSIS, MICROSCOPIC (REFLEX): Bacteria, UA: NONE SEEN

## 2021-05-05 LAB — CBG MONITORING, ED
Glucose-Capillary: 323 mg/dL — ABNORMAL HIGH (ref 70–99)
Glucose-Capillary: 217 mg/dL — ABNORMAL HIGH (ref 70–99)

## 2021-05-05 LAB — TSH: TSH: 1.489 u[IU]/mL (ref 0.350–4.500)

## 2021-05-05 MED ORDER — ONDANSETRON HCL 4 MG/2ML IJ SOLN
4.0000 mg | Freq: Four times a day (QID) | INTRAMUSCULAR | Status: DC | PRN
  Administered 2021-05-05: 4 mg via INTRAVENOUS
  Filled 2021-05-05: qty 2

## 2021-05-05 MED ORDER — ASPIRIN 81 MG PO CHEW
81.0000 mg | CHEWABLE_TABLET | Freq: Every day | ORAL | Status: DC
  Administered 2021-05-05 – 2021-05-09 (×5): 81 mg via ORAL
  Filled 2021-05-05 (×5): qty 1

## 2021-05-05 MED ORDER — ALLOPURINOL 300 MG PO TABS
300.0000 mg | ORAL_TABLET | Freq: Every day | ORAL | Status: DC
  Administered 2021-05-06 – 2021-05-09 (×4): 300 mg via ORAL
  Filled 2021-05-05 (×4): qty 1

## 2021-05-05 MED ORDER — ATORVASTATIN CALCIUM 40 MG PO TABS
40.0000 mg | ORAL_TABLET | Freq: Every day | ORAL | Status: DC
  Administered 2021-05-05: 40 mg via ORAL
  Filled 2021-05-05: qty 1

## 2021-05-05 MED ORDER — METOPROLOL SUCCINATE ER 50 MG PO TB24
50.0000 mg | ORAL_TABLET | Freq: Every day | ORAL | Status: DC
  Administered 2021-05-05 – 2021-05-09 (×5): 50 mg via ORAL
  Filled 2021-05-05 (×4): qty 1
  Filled 2021-05-05: qty 2

## 2021-05-05 MED ORDER — PERFLUTREN LIPID MICROSPHERE
1.0000 mL | INTRAVENOUS | Status: AC | PRN
  Administered 2021-05-05: 2 mL via INTRAVENOUS
  Filled 2021-05-05: qty 10

## 2021-05-05 MED ORDER — SODIUM CHLORIDE 0.9 % IV SOLN: INTRAVENOUS | Status: DC

## 2021-05-05 MED ORDER — LABETALOL HCL 5 MG/ML IV SOLN
10.0000 mg | Freq: Once | INTRAVENOUS | Status: AC
  Administered 2021-05-05: 10 mg via INTRAVENOUS
  Filled 2021-05-05: qty 4

## 2021-05-05 MED ORDER — ACETAMINOPHEN 160 MG/5ML PO SOLN: 650.0000 mg | ORAL | Status: DC | PRN

## 2021-05-05 MED ORDER — CHLORTHALIDONE 25 MG PO TABS
25.0000 mg | ORAL_TABLET | Freq: Every day | ORAL | Status: DC
  Administered 2021-05-05 – 2021-05-09 (×5): 25 mg via ORAL
  Filled 2021-05-05 (×5): qty 1

## 2021-05-05 MED ORDER — LISINOPRIL 20 MG PO TABS
40.0000 mg | ORAL_TABLET | Freq: Every day | ORAL | Status: DC
  Administered 2021-05-05 – 2021-05-09 (×5): 40 mg via ORAL
  Filled 2021-05-05 (×5): qty 2

## 2021-05-05 MED ORDER — ACETAMINOPHEN 325 MG PO TABS: 650.0000 mg | ORAL_TABLET | ORAL | Status: DC | PRN

## 2021-05-05 MED ORDER — INSULIN DETEMIR 100 UNIT/ML ~~LOC~~ SOLN
15.0000 [IU] | Freq: Two times a day (BID) | SUBCUTANEOUS | Status: DC
  Administered 2021-05-05 – 2021-05-06 (×2): 15 [IU] via SUBCUTANEOUS
  Filled 2021-05-05 (×6): qty 0.15

## 2021-05-05 MED ORDER — SENNOSIDES-DOCUSATE SODIUM 8.6-50 MG PO TABS
1.0000 | ORAL_TABLET | Freq: Every evening | ORAL | Status: DC | PRN
Start: 2021-05-05 — End: 2021-05-09

## 2021-05-05 MED ORDER — INSULIN ASPART 100 UNIT/ML IJ SOLN
0.0000 [IU] | Freq: Three times a day (TID) | INTRAMUSCULAR | Status: DC
  Administered 2021-05-05: 5 [IU] via SUBCUTANEOUS
  Administered 2021-05-05: 11 [IU] via SUBCUTANEOUS
  Administered 2021-05-07 (×2): 3 [IU] via SUBCUTANEOUS
  Administered 2021-05-07: 2 [IU] via SUBCUTANEOUS
  Administered 2021-05-08: 3 [IU] via SUBCUTANEOUS
  Administered 2021-05-08: 2 [IU] via SUBCUTANEOUS
  Administered 2021-05-09: 3 [IU] via SUBCUTANEOUS
  Administered 2021-05-09: 8 [IU] via SUBCUTANEOUS

## 2021-05-05 MED ORDER — STROKE: EARLY STAGES OF RECOVERY BOOK
Freq: Once | Status: DC
  Filled 2021-05-05: qty 1

## 2021-05-05 MED ORDER — GADOBUTROL 1 MMOL/ML IV SOLN
10.0000 mL | Freq: Once | INTRAVENOUS | Status: AC | PRN
  Administered 2021-05-05: 10 mL via INTRAVENOUS

## 2021-05-05 MED ORDER — ACETAMINOPHEN 650 MG RE SUPP: 650.0000 mg | RECTAL | Status: DC | PRN

## 2021-05-05 MED ORDER — HYDRALAZINE HCL 20 MG/ML IJ SOLN
5.0000 mg | INTRAMUSCULAR | Status: DC | PRN
  Administered 2021-05-05 – 2021-05-06 (×2): 5 mg via INTRAVENOUS
  Filled 2021-05-05 (×2): qty 1

## 2021-05-05 MED ORDER — ONDANSETRON HCL 4 MG/2ML IJ SOLN
4.0000 mg | Freq: Once | INTRAMUSCULAR | Status: AC
  Administered 2021-05-05: 4 mg via INTRAVENOUS
  Filled 2021-05-05: qty 2

## 2021-05-05 NOTE — Assessment & Plan Note (Addendum)
-  Patient with prior CVA and ICH presenting with several days of stroke-like symptoms -CT with cerebellar infarct; MRI confirms and there is evidence of mass effect on the 4th ventricle without obstruction -He is well out of the window for tPA -Aspirin has been given to reduce stroke mortality and decrease morbidity -Will admit for further evaluation -Telemetry monitoring -CTA head/neck -Echo -Risk stratification with FLP, A1c; will also check TSH and UDS -Patient will need likely need DAPT for 21 days.  He has not been taking ASA -Repeat CT ordered for 10pm tonight to assess edema. -Neurology consult -PT/OT/ST/Nutrition Consults  HTN -Out of the window for permissive HTN -SBP goal per neurology is 120-140 -Resume home meds - Toprol XL, lisinopril, chlorthalidone -Will add prn IV hydralazine   HLD -Check FLP -Continue Lipitor 40 mg daily   DM -Check A1c; prior indicated very poor control, >13 -Hold home PO medications (actos, glucophage, Amaryl) -He stopped taking Levemir, will resume -Will order moderate-scale SSI

## 2021-05-05 NOTE — ED Notes (Signed)
Patient transported to MRI 

## 2021-05-05 NOTE — Consult Note (Signed)
NEURO HOSPITALIST CONSULT NOTE   Requestig physician: Dr. Ophelia Charter  Reason for Consult: Subacute large right cerebellar ischemic infarction  History obtained from:  Chart     HPI:                                                                                                                                          Manuel Chapman is an 76 y.o. male with a PMHx of prior ICH, gout, DM and HTN who presented overnight with a several day history of lightheadedness, dizziness with N/V. His dizziness had acutely worsened on Monday, which prompted him to call EMS. His N/V was worsened with movement. He denied any headache, fevers/chills, cough, diarrhea, head trauma or neck stiffness. He had severe HTN in the ED requiring antihypertensive medication.   CT head revealed a right cerebellar hypodensity most consistent with an ischemic stroke.   MRI brain was then obtained, confirming the presence of a subacute right cerebellar stroke.   Home medications include ASA and atorvastatin.   Past Medical History:  Diagnosis Date   Diabetes mellitus    Hypertension     No past surgical history on file.  Family History  Problem Relation Age of Onset   Diabetes Neg Hx              Social History:  reports that he has never smoked. He has never used smokeless tobacco. He reports current alcohol use. He reports that he does not use drugs.  No Known Allergies  HOME MEDICATIONS:                                                                                                                     No current facility-administered medications on file prior to encounter.   Current Outpatient Medications on File Prior to Encounter  Medication Sig Dispense Refill   allopurinol (ZYLOPRIM) 300 MG tablet Take 300 mg by mouth daily.     atorvastatin (LIPITOR) 40 MG tablet Take 40 mg by mouth daily.     chlorthalidone (HYGROTON) 25 MG tablet Take 25 mg by mouth daily.      diphenhydrAMINE-APAP, sleep, (TYLENOL PM EXTRA STRENGTH PO) Take 1 tablet by mouth at bedtime as needed (sleep).     glimepiride (AMARYL) 2 MG  tablet Take 2 mg by mouth daily.     lisinopril (ZESTRIL) 40 MG tablet Take 1 tablet (40 mg total) by mouth daily. 30 tablet 1   metFORMIN (GLUCOPHAGE) 1000 MG tablet Take 1,000 mg by mouth daily with breakfast.     metoprolol succinate (TOPROL-XL) 50 MG 24 hr tablet Take 1 tablet (50 mg total) by mouth daily. Take with or immediately following a meal. 30 tablet 1   pioglitazone (ACTOS) 45 MG tablet Take 45 mg by mouth daily.     acetaminophen (TYLENOL) 325 MG tablet Take 2 tablets (650 mg total) by mouth every 4 (four) hours as needed for mild pain (or temp > 37.5 C (99.5 F)). (Patient not taking: Reported on 05/05/2021) 180 tablet 0   amLODipine (NORVASC) 10 MG tablet Take 1 tablet (10 mg total) by mouth daily. (Patient not taking: Reported on 05/05/2021) 30 tablet 1   aspirin 81 MG chewable tablet Chew 1 tablet (81 mg total) by mouth daily. (Patient not taking: Reported on 05/05/2021) 30 tablet 1   insulin detemir (LEVEMIR) 100 UNIT/ML injection Inject 0.15 mLs (15 Units total) into the skin 2 (two) times daily. (Patient not taking: Reported on 05/05/2021) 10 mL 11     ROS:                                                                                                                                       As per HPI.    Blood pressure (!) 224/90, pulse 76, temperature 98 F (36.7 C), temperature source Oral, resp. rate 20, SpO2 95 %.   General Examination:                                                                                                       Physical Exam  HEENT-  [place]/AT   Lungs- Respirations unlabored Extremities- Warm and well perfused   Neurological Examination Mental Status: Awake, with decreased level of alertness. Speech is dysarthric. Not oriented to the month, the day or the year on initial exam. The patient then asked where  he was, and then began to appear agitated, stating that he needed to pee.  On follow up exam 2 hours later, he was fully oriented to place and time, except for stating that the day as "Sunday" Cranial Nerves: II: Temporal visual fields intact with no extinction to DSS. Left pupil 4 mm >> 3 mm, right pupil 3 mm >> 2 mm  III,IV, VI: No ptosis. EOMI.  There is sustained horizontal nystagmus on rightward gaze.  V: Temp sensation equal bilaterally VII: Smile symmetric VIII: Hearing intact to voice IX,X: No hypophonia or hoarseness XI: Head is midline XII: Midline tongue extension Motor: Right : Upper extremity   5/5    Left:     Upper extremity   5/5  Lower extremity   5/5    Lower extremity   5/5 Normal tone throughout; no atrophy noted Sensory: Temp and light touch intact throughout, bilaterally. No extinction to DSS.  Deep Tendon Reflexes: Hypoactive x 4 Plantars: Right: downgoing   Left: downgoing Cerebellar: No ataxia with FNF bilaterally, but with slowed movements. Mild ataxia with H-S bilaterally. Has difficulty with RAM bilaterally.  Gait: Deferred due to falls risk concerns   Lab Results: Basic Metabolic Panel: Recent Labs  Lab 05/05/21 0226 05/05/21 0538  NA 136 134*  K 3.7 4.0  CL  --  101  CO2  --  22  GLUCOSE  --  300*  BUN  --  12  CREATININE  --  0.79  CALCIUM  --  7.6*    CBC: Recent Labs  Lab 05/05/21 0210 05/05/21 0226  WBC 11.4*  --   NEUTROABS 8.6*  --   HGB 10.5* 12.6*  HCT 31.5* 37.0*  MCV 89.2  --   PLT 282  --     Cardiac Enzymes: No results for input(s): CKTOTAL, CKMB, CKMBINDEX, TROPONINI in the last 168 hours.  Lipid Panel: No results for input(s): CHOL, TRIG, HDL, CHOLHDL, VLDL, LDLCALC in the last 168 hours.  Imaging: CT HEAD WO CONTRAST (5MM)  Result Date: 05/05/2021 CLINICAL DATA:  Dizziness, persistent or recurrent with cardiac or vascular cause suspected EXAM: CT HEAD WITHOUT CONTRAST TECHNIQUE: Contiguous axial images were obtained  from the base of the skull through the vertex without intravenous contrast. COMPARISON:  Brain MRI 11/29/2019 FINDINGS: Brain: Large low-density in the right cerebellum which reaches the surface, more consistent with acute infarct than vasogenic edema. Most of the hemisphere is involved. The fourth ventricle remains patent. There is lateral ventriculomegaly that is chronic with callosal angle narrowing but no convincing normal pressure hydrocephalus. Chronic small vessel ischemia in the hemispheric white matter and also in the pons by MRI. Vascular: No hyperdense vessel or unexpected calcification. Skull: Normal. Negative for fracture or focal lesion. Sinuses/Orbits: No acute finding. IMPRESSION: 1. Large low-density in the right cerebellum, favor recent infarct over vasogenic edema. Recommend MR characterization. 2. Patent fourth ventricle.  No obstructive hydrocephalus. 3. Advanced chronic small vessel disease. Electronically Signed   By: Tiburcio PeaJonathan  Watts M.D.   On: 05/05/2021 06:25   MR Brain W and Wo Contrast  Result Date: 05/05/2021 CLINICAL DATA:  Neuro deficit, acute, stroke suspected. Diabetes and hypertension. Abnormal head CT. EXAM: MRI HEAD WITHOUT AND WITH CONTRAST TECHNIQUE: Multiplanar, multiecho pulse sequences of the brain and surrounding structures were obtained without and with intravenous contrast. CONTRAST:  10mL GADAVIST GADOBUTROL 1 MMOL/ML IV SOLN COMPARISON:  Head CT same day.  MRI 11/29/2019. FINDINGS: Brain: Extensive region of acute infarction within the inferior cerebellum on the right, probably PICA distribution. Swelling and edema suggest that this is early subacute. Very minimal petechial blood products present without hematoma. No other acute infarction. Chronic small-vessel ischemic changes are seen throughout the pons. Confluent chronic small vessel ischemic changes are present throughout the cerebral hemispheric white matter. Old hemorrhagic infarction in the left posterior  caudate body is evident. Chronic ventriculomegaly secondary to central atrophy. No extra-axial  collection. Vascular: Major vessels at the base of the brain show flow. Skull and upper cervical spine: Negative Sinuses/Orbits: Clear/normal Other: None IMPRESSION: Large region of acute/early subacute infarction affecting the inferior cerebellum on the right consistent with right PICA territory infarction. Petechial blood products but no frank hematoma. Mass-effect upon the fourth ventricle without evidence of obstruction at this time. Chronic ventriculomegaly felt secondary to central atrophy. Extensive chronic small-vessel ischemic changes affecting the pons and cerebral hemispheric white matter. 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 Electronically Signed   By: Paulina FusiMark  Shogry M.D.   On: 05/05/2021 09:16   DG Chest Portable 1 View  Result Date: 05/05/2021 CLINICAL DATA:  Dizziness. EXAM: PORTABLE CHEST 1 VIEW COMPARISON:  Chest radiograph dated 11/28/2019. FINDINGS: Cardiomegaly with vascular congestion. No focal consolidation, pleural effusion or pneumothorax. Atherosclerotic calcification of the aorta. Degenerative changes of spine. No acute osseous pathology. IMPRESSION: Cardiomegaly with vascular congestion. No focal consolidation. Electronically Signed   By: Elgie CollardArash  Radparvar M.D.   On: 05/05/2021 02:56     Assessment: 76 year old male with a prior history of ICH, presenting with a large subacute right cerebellar ischemic infarction with mild mass effect and associated petechial blood products.  1. Exam reveals mild disorientation, dysarthria and dystaxia. Mildly asymmetric pupils are briskly reactive and felt to be unrelated to the patient's stroke.  2. CT head: Large low-density in the right cerebellum, favor recent infarct over vasogenic edema. Recommend MR characterization. Patent fourth ventricle.  No obstructive hydrocephalus. Advanced chronic small vessel disease 3. MRI brain:  Large region of acute/early subacute infarction affecting the inferior cerebellum on the right consistent with right PICA territory infarction. Petechial blood products but no frank hematoma. Mass-effect upon the fourth ventricle without evidence of obstruction at this time.   Chronic ventriculomegaly felt secondary to central atrophy. Extensive chronic small-vessel ischemic changes affecting the pons and cerebral hemispheric white matter. 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 4. Hyperglycemic in the ED 5. Stroke risk factors: DM and HTN  Recommendations: 1. Repeat CT head at 10 PM tonight   2. Frequent neuro checks, including assessment of level of consciousness and pupils 3. CTA of head and neck 4. Glycemic control 5. PT consult, OT consult, Speech consult 6. TTE 7. Continue ASA and atorvastatin 8. Risk factor modification 9. Telemetry monitoring 10. NPO until passes stroke swallow screen 11. Several days out from onset of symptoms. Stroke age appears > 24 hours on MRI. Discontinue permissive HTN. Goal SBP of 120-140.   Electronically signed: Dr. Caryl PinaEric Laurens Matheny 05/05/2021, 9:44 AM

## 2021-05-05 NOTE — H&P (Signed)
History and Physical    Patient: Manuel Chapman [id number] DOB: [birth year] DOA: 05/05/2021 DOS: the patient was seen and examined on 05/05/2021 PCP: Lorene Dy, MD  Patient coming from: Home - lives with caregiver; [facility]: Caregiver, [employer], [phone], [phone]   Chief Complaint: Dizziness  HPI: Manuel Chapman is a 76 y.o. male with medical history significant of HTN, DM, and ICH (11/2019) presenting with dizziness.   He noticed 3 days ago that he "know I had to come, I kept putting it off."  He has bene vomiting, HTN, sweaty head, dizziness.  Symptoms have "changed" over that time but he can't really describe how.  No N/W/T of arms/legs.  Denies dysarthria.  No dysphagia.  +headache.      ER Course:  Dizziness for several days.  CT abnormal, MRI with CVA.  Neurology will consult.  Patient is comfortable at rest.  Persistently hypertensive, likely needs permissive HTN.   Review of Systems: As mentioned in the history of present illness. All other systems reviewed and are negative. Past Medical History:  Diagnosis Date   CVA (cerebral vascular accident) ([place]) 2020   Diabetes mellitus    Hypertension    ICH (intracerebral hemorrhage) ([place]) 11/2019   History reviewed. No pertinent surgical history. Social History:  reports that he quit smoking about 25 years ago. His smoking use included cigarettes. He has a 10.00 pack-year smoking history. He has never used smokeless tobacco. He reports current alcohol use. He reports that he does not use drugs.  No Known Allergies  Family History  Problem Relation Age of Onset   Diabetes Neg Hx    Stroke Neg Hx     Prior to Admission medications   Medication Sig Start Date End Date Taking? Authorizing Provider  allopurinol (ZYLOPRIM) 300 MG tablet Take 300 mg by mouth daily.   Yes [provider]  atorvastatin (LIPITOR) 40 MG tablet Take 40 mg by mouth daily. 12/24/20  Yes [provider]   chlorthalidone (HYGROTON) 25 MG tablet Take 25 mg by mouth daily. 12/24/20  Yes [provider]  diphenhydrAMINE-APAP, sleep, (TYLENOL PM EXTRA STRENGTH PO) Take 1 tablet by mouth at bedtime as needed (sleep).   Yes [provider]  glimepiride (AMARYL) 2 MG tablet Take 2 mg by mouth daily. 12/24/20  Yes [provider]  lisinopril (ZESTRIL) 40 MG tablet Take 1 tablet (40 mg total) by mouth daily. 12/05/19  Yes [name], NP  metFORMIN (GLUCOPHAGE) 1000 MG tablet Take 1,000 mg by mouth daily with breakfast. 01/27/21  Yes [provider]  metoprolol succinate (TOPROL-XL) 50 MG 24 hr tablet Take 1 tablet (50 mg total) by mouth daily. Take with or immediately following a meal. 12/05/19  Yes [name], NP  pioglitazone (ACTOS) 45 MG tablet Take 45 mg by mouth daily. 01/27/21  Yes [provider]  acetaminophen (TYLENOL) 325 MG tablet Take 2 tablets (650 mg total) by mouth every 4 (four) hours as needed for mild pain (or temp > 37.5 C (99.5 F)). Patient not taking: Reported on 05/05/2021 12/04/19   [name], NP  amLODipine (NORVASC) 10 MG tablet Take 1 tablet (10 mg total) by mouth daily. Patient not taking: Reported on 05/05/2021 12/05/19   [name], NP  aspirin 81 MG chewable tablet Chew 1 tablet (81 mg total) by mouth daily. Patient not taking: Reported on 05/05/2021 12/04/19   [name], NP  atorvastatin (LIPITOR) 20 MG tablet Take 1 tablet (  20 mg total) by mouth daily. Patient not taking: Reported on 05/05/2021 12/05/19   [name], NP  glimepiride (AMARYL) 1 MG tablet Take 1 tablet (1 mg total) by mouth daily. Patient not taking: Reported on 05/05/2021 12/05/19   [name], NP  insulin aspart (NOVOLOG) 100 UNIT/ML injection Inject 0-5 Units into the skin at bedtime. Patient not taking: Reported on 05/05/2021 12/04/19   [name], NP  insulin aspart (NOVOLOG) 100 UNIT/ML injection Inject  0-9 Units into the skin 3 (three) times daily with meals. Patient not taking: Reported on 05/05/2021 12/04/19   [name], NP  insulin detemir (LEVEMIR) 100 UNIT/ML injection Inject 0.15 mLs (15 Units total) into the skin 2 (two) times daily. Patient not taking: Reported on 05/05/2021 12/04/19   [name], NP    Physical Exam: Vitals:   05/05/21 1254 05/05/21 1300 05/05/21 1430 05/05/21 1645  BP: (!) 201/85 (!) 191/87 (!) 193/83 130/64  Pulse: 76 72 72 79  Resp: (!) 22 12 17 16   Temp:      TempSrc:      SpO2: 93% 95% 96% 96%   General:  Appears chronically ill Eyes:  PERRL, EOMI, normal lids, iris ENT:  grossly normal hearing, lips & tongue, mmm Neck:  no LAD, masses or thyromegaly Cardiovascular:  RRR, no m/r/g. No LE edema.  Respiratory:   CTA bilaterally with no wheezes/rales/rhonchi.  Normal respiratory effort. Abdomen:  soft, NT, ND Back:   normal alignment, no CVAT Skin:  no rash or induration seen on limited exam Musculoskeletal:  grossly normal tone BUE/BLE, good ROM, no bony abnormality Psychiatric:  blunted mood and affect, speech dysarthric but mostly appropriate, AOx3 Neurologic:  CN 2-12 grossly intact other than subtle R facial droop, moves all extremities in coordinated fashion, sensation intact   Radiological Exams on Admission: Independently reviewed - see discussion in A/P where applicable  CT HEAD WO CONTRAST (5MM)  Result Date: 05/05/2021 CLINICAL DATA:  Dizziness, persistent or recurrent with cardiac or vascular cause suspected EXAM: CT HEAD WITHOUT CONTRAST TECHNIQUE: Contiguous axial images were obtained from the base of the skull through the vertex without intravenous contrast. COMPARISON:  Brain MRI 11/29/2019 FINDINGS: Brain: Large low-density in the right cerebellum which reaches the surface, more consistent with acute infarct than vasogenic edema. Most of the hemisphere is involved. The fourth ventricle remains patent. There is lateral  ventriculomegaly that is chronic with callosal angle narrowing but no convincing normal pressure hydrocephalus. Chronic small vessel ischemia in the hemispheric white matter and also in the pons by MRI. Vascular: No hyperdense vessel or unexpected calcification. Skull: Normal. Negative for fracture or focal lesion. Sinuses/Orbits: No acute finding. IMPRESSION: 1. Large low-density in the right cerebellum, favor recent infarct over vasogenic edema. Recommend MR characterization. 2. Patent fourth ventricle.  No obstructive hydrocephalus. 3. Advanced chronic small vessel disease. Electronically Signed   By: Jorje Guild M.D.   On: 05/05/2021 06:25   MR Brain W and Wo Contrast  Result Date: 05/05/2021 CLINICAL DATA:  Neuro deficit, acute, stroke suspected. Diabetes and hypertension. Abnormal head CT. EXAM: MRI HEAD WITHOUT AND WITH CONTRAST TECHNIQUE: Multiplanar, multiecho pulse sequences of the brain and surrounding structures were obtained without and with intravenous contrast. CONTRAST:  25mL GADAVIST GADOBUTROL 1 MMOL/ML IV SOLN COMPARISON:  Head CT same day.  MRI 11/29/2019. FINDINGS: Brain: Extensive region of acute infarction within the inferior cerebellum on the right, probably PICA distribution. Swelling and edema suggest that this  is early subacute. Very minimal petechial blood products present without hematoma. No other acute infarction. Chronic small-vessel ischemic changes are seen throughout the pons. Confluent chronic small vessel ischemic changes are present throughout the cerebral hemispheric white matter. Old hemorrhagic infarction in the left posterior caudate body is evident. Chronic ventriculomegaly secondary to central atrophy. No extra-axial collection. Vascular: Major vessels at the base of the brain show flow. Skull and upper cervical spine: Negative Sinuses/Orbits: Clear/normal Other: None IMPRESSION: Large region of acute/early subacute infarction affecting the inferior cerebellum on  the right consistent with right PICA territory infarction. Petechial blood products but no frank hematoma. Mass-effect upon the fourth ventricle without evidence of obstruction at this time. Chronic ventriculomegaly felt secondary to central atrophy. Extensive chronic small-vessel ischemic changes affecting the pons and cerebral hemispheric white matter. 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 Electronically Signed   By: Nelson Chimes M.D.   On: 05/05/2021 09:16   DG Chest Portable 1 View  Result Date: 05/05/2021 CLINICAL DATA:  Dizziness. EXAM: PORTABLE CHEST 1 VIEW COMPARISON:  Chest radiograph dated 11/28/2019. FINDINGS: Cardiomegaly with vascular congestion. No focal consolidation, pleural effusion or pneumothorax. Atherosclerotic calcification of the aorta. Degenerative changes of spine. No acute osseous pathology. IMPRESSION: Cardiomegaly with vascular congestion. No focal consolidation. Electronically Signed   By: Anner Crete M.D.   On: 05/05/2021 02:56   ECHOCARDIOGRAM COMPLETE  Result Date: 05/05/2021    ECHOCARDIOGRAM REPORT   Patient Name:   Manuel Chapman Date of Exam: 05/05/2021 Medical Rec #:  [id number]             Height:       66.0 in Accession #:    [id number]            Weight:       226.2 lb Date of Birth:  [birth year]             BSA:          2.107 m Patient Age:    5 years              BP:           177/93 mmHg Patient Gender: M                     HR:           76 bpm. Exam Location:  Inpatient Procedure: 2D Echo, Cardiac Doppler, Color Doppler and Intracardiac            Opacification Agent Indications:    Stroke  History:        Patient has prior history of Echocardiogram examinations, most                 recent 11/29/2019. Aortic Valve Disease; Risk Factors:Diabetes,                 Hypertension and Dyslipidemia.  Sonographer:    Glo Herring Referring Phys: [place]  1. Left ventricular ejection fraction, by estimation, is  45 to 50%. The left ventricle has mildly decreased function. The left ventricle demonstrates regional wall motion abnormalities (see scoring diagram/findings for description). There is moderate concentric left ventricular hypertrophy. Left ventricular diastolic parameters are consistent with Grade II diastolic dysfunction (pseudonormalization). Elevated left atrial pressure. There is akinesis of the left ventricular, basal-mid inferolateral wall.  2. Right ventricular systolic function is mildly reduced. The right ventricular size is mildly enlarged. Tricuspid regurgitation signal is  inadequate for assessing PA pressure.  3. Left atrial size was mildly dilated.  4. Right atrial size was mildly dilated.  5. The mitral valve is normal in structure. Mild mitral valve regurgitation.  6. The aortic valve is tricuspid. There is severe calcifcation of the aortic valve. There is severe thickening of the aortic valve. Aortic valve regurgitation is trivial. Severe aortic valve stenosis. Aortic valve mean gradient measures 30.9 mmHg. Aortic valve Vmax measures 4.04 m/s. Comparison(s): Prior images reviewed side by side. The left ventricular function is worsened. The right ventricular systolic function is worse. Aortic stenosis has worsened and is now severe. FINDINGS  Left Ventricle: Left ventricular ejection fraction, by estimation, is 45 to 50%. The left ventricle has mildly decreased function. The left ventricle demonstrates regional wall motion abnormalities. The left ventricular internal cavity size was normal in size. There is moderate concentric left ventricular hypertrophy. Left ventricular diastolic parameters are consistent with Grade II diastolic dysfunction (pseudonormalization). Elevated left atrial pressure.  LV Wall Scoring: The posterior wall is akinetic. Right Ventricle: The right ventricular size is mildly enlarged. Right vetricular wall thickness was not well visualized. Right ventricular systolic function  is mildly reduced. Tricuspid regurgitation signal is inadequate for assessing PA pressure. Left Atrium: Left atrial size was mildly dilated. Right Atrium: Right atrial size was mildly dilated. Pericardium: There is no evidence of pericardial effusion. Mitral Valve: The mitral valve is normal in structure. Mild mitral valve regurgitation, with centrally-directed jet. Tricuspid Valve: The tricuspid valve is normal in structure. Tricuspid valve regurgitation is not demonstrated. Aortic Valve: The aortic valve is tricuspid. There is severe calcifcation of the aortic valve. There is severe thickening of the aortic valve. Aortic valve regurgitation is trivial. Severe aortic stenosis is present. Aortic valve mean gradient measures 30.9 mmHg. Aortic valve peak gradient measures 65.2 mmHg. Aortic valve area, by VTI measures 0.58 cm. Pulmonic Valve: The pulmonic valve was grossly normal. Pulmonic valve regurgitation is not visualized. Aorta: The aortic root and ascending aorta are structurally normal, with no evidence of dilitation. IAS/Shunts: The interatrial septum was not well visualized.  LEFT VENTRICLE PLAX 2D LVIDd:         5.00 cm   Diastology LVIDs:         3.80 cm   LV e' medial:    4.00 cm/s LV PW:         1.10 cm   LV E/e' medial:  28.3 LV IVS:        1.60 cm   LV e' lateral:   4.87 cm/s LVOT diam:     1.90 cm   LV E/e' lateral: 23.2 LV SV:         58 LV SV Index:   27 LVOT Area:     2.84 cm  RIGHT VENTRICLE            IVC RV S prime:     7.31 cm/s  IVC diam: 2.40 cm LEFT ATRIUM         Index LA diam:    4.20 cm 1.99 cm/m  AORTIC VALVE AV Area (Vmax):    0.57 cm AV Area (Vmean):   0.64 cm AV Area (VTI):     0.58 cm AV Vmax:           403.81 cm/s AV Vmean:          244.780 cm/s AV VTI:            0.991 m AV Peak Grad:  65.2 mmHg AV Mean Grad:      30.9 mmHg LVOT Vmax:         80.93 cm/s LVOT Vmean:        55.267 cm/s LVOT VTI:          0.204 m LVOT/AV VTI ratio: 0.21  AORTA Ao Root diam: 3.10 cm Ao Asc  diam:  2.85 cm MITRAL VALVE MV Area (PHT): 4.36 cm     SHUNTS MV Decel Time: 174 msec     Systemic VTI:  0.20 m MV E velocity: 113.00 cm/s  Systemic Diam: 1.90 cm MV A velocity: 125.00 cm/s MV E/A ratio:  0.90 Mihai Croitoru MD Electronically signed by Sanda Klein MD Signature Date/Time: 05/05/2021/5:18:53 PM    Final    VAS [place] CAROTID (at [facility] and [facility] only)  Result Date: 05/05/2021 Carotid Arterial Duplex Study Patient Name:  Manuel Chapman  Date of Exam:   05/05/2021 Medical Rec #: [id number]              Accession #:    [id number] Date of Birth: 10/02/1945              Patient Gender: M Patient Age:   13 years Exam Location:  [facility] Procedure:      VAS US CAROTID Referring Phys: Karmen Bongo --------------------------------------------------------------------------------  Indications:  CVA. Risk Factors: Hypertension, hyperlipidemia, Diabetes, past history of smoking. Performing Technologist: Darlin Coco RDMS, RVT  Examination Guidelines: A complete evaluation includes B-mode imaging, spectral Doppler, color Doppler, and power Doppler as needed of all accessible portions of each vessel. Bilateral testing is considered an integral part of a complete examination. Limited examinations for reoccurring indications may be performed as noted.  Right Carotid Findings: +----------+--------+--------+--------+---------------------+------------------+             PSV cm/s EDV cm/s Stenosis Plaque Description    Comments            +----------+--------+--------+--------+---------------------+------------------+  CCA Prox   60       9                                                           +----------+--------+--------+--------+---------------------+------------------+  CCA Distal 50       7                                       intimal thickening  +----------+--------+--------+--------+---------------------+------------------+  ICA Prox   26       8        1-39%    hyperechoic and focal                      +----------+--------+--------+--------+---------------------+------------------+  ICA Distal 40       12                                                          +----------+--------+--------+--------+---------------------+------------------+  ECA        41                                                                   +----------+--------+--------+--------+---------------------+------------------+ +----------+--------+-------+----------------+-------------------+  PSV cm/s EDV cms Describe         Arm Pressure (mmHG)  +----------+--------+-------+----------------+-------------------+  Subclavian 64               Multiphasic, WNL                      +----------+--------+-------+----------------+-------------------+ +---------+--------+--+--------+-+---------+  Vertebral PSV cm/s 27 EDV cm/s 6 Antegrade  +---------+--------+--+--------+-+---------+  Left Carotid Findings: +----------+--------+--------+--------+------------------+--------+             PSV cm/s EDV cm/s Stenosis Plaque Description Comments  +----------+--------+--------+--------+------------------+--------+  CCA Prox   50       7                                              +----------+--------+--------+--------+------------------+--------+  CCA Distal 54       11                                             +----------+--------+--------+--------+------------------+--------+  ICA Prox   46       12                heterogenous mild            +----------+--------+--------+--------+------------------+--------+  ICA Distal 66       16                                             +----------+--------+--------+--------+------------------+--------+  ECA        51                                                      +----------+--------+--------+--------+------------------+--------+ +----------+--------+--------+----------------+-------------------+             PSV cm/s EDV cm/s Describe         Arm Pressure (mmHG)   +----------+--------+--------+----------------+-------------------+  Subclavian 83                Multiphasic, WNL                      +----------+--------+--------+----------------+-------------------+ +---------+--------+--+--------+-+---------+  Vertebral PSV cm/s 33 EDV cm/s 8 Antegrade  +---------+--------+--+--------+-+---------+   Summary: Right Carotid: Velocities in the right ICA are consistent with a 1-39% stenosis. Left Carotid: The extracranial vessels were near-normal with only minimal wall               thickening or plaque. Vertebrals:  Bilateral vertebral arteries demonstrate antegrade flow. Subclavians: Normal flow hemodynamics were seen in bilateral subclavian              arteries. *See table(s) above for measurements and observations.     Preliminary     EKG: Independently reviewed.  NSR with rate 99; LAFB; nonspecific ST changes with no evidence of acute ischemia   Labs on Admission: I have personally reviewed the available labs and imaging studies at the time of the admission.  Pertinent labs:    VBG: 7.445/39.3/27.0 Glucose  300 Calcium 7.6 Albumin 2.4 WBC 11.4 Hgb 10.5 COVID/flu negative TSH 1.489 UA: >500, moderate Hgb,>300 protein   Assessment/Plan * Acute CVA (cerebrovascular accident) ([place])- (present on admission) -Patient with prior CVA and ICH presenting with several days of stroke-like symptoms -CT with cerebellar infarct; MRI confirms and there is evidence of mass effect on the 4th ventricle without obstruction -He is well out of the window for tPA -Aspirin has been given to reduce stroke mortality and decrease morbidity -Will admit for further evaluation -Telemetry monitoring -CTA head/neck -Echo -Risk stratification with FLP, A1c; will also check TSH and UDS -Patient will need likely need DAPT for 21 days.  He has not been taking ASA -Repeat CT ordered for 10pm tonight to assess edema. -Neurology consult -PT/OT/ST/Nutrition Consults  HTN -Out of  the window for permissive HTN -SBP goal per neurology is 120-140 -Resume home meds - Toprol XL, lisinopril, chlorthalidone -Will add prn IV hydralazine   HLD -Check FLP -Continue Lipitor 40 mg daily   DM -Check A1c; prior indicated very poor control, >13 -Hold home PO medications (actos, glucophage, Amaryl) -He stopped taking Levemir, will resume -Will order moderate-scale SSI    Advance Care Planning:   Code Status: Full Code   Consults: Neurology; PT/OT/ST/TOC team/Nutrition  Family Communication: I spoke with his caregiver at the time of admission  Severity of Illness: The appropriate patient status for this patient is INPATIENT. Inpatient status is judged to be reasonable and necessary in order to provide the required intensity of service to ensure the patient's safety. The patient's presenting symptoms, physical exam findings, and initial radiographic and laboratory data in the context of their chronic comorbidities is felt to place them at high risk for further clinical deterioration. Furthermore, it is not anticipated that the patient will be medically stable for discharge from the hospital within 2 midnights of admission.   * I certify that at the point of admission it is my clinical judgment that the patient will require inpatient hospital care spanning beyond 2 midnights from the point of admission due to high intensity of service, high risk for further deterioration and high frequency of surveillance required.*  Author: Karmen Bongo, MD 05/05/2021 6:02 PM  For on call review [email or web address].

## 2021-05-05 NOTE — ED Notes (Signed)
Neuro came at bedside and states pt bp has to be between 120-140 systolic. Admit team made aware. Pt is alert and oriented x 4.

## 2021-05-05 NOTE — ED Triage Notes (Signed)
Pt bib [facility] for dizziness x1 day. Pt also vomiting with position changes. Hx of CVA and DM2. EMS CBG 360

## 2021-05-05 NOTE — ED Notes (Signed)
Pt had episode of urinating on self - bed sheets and gown changed

## 2021-05-05 NOTE — ED Provider Notes (Signed)
[facility] EMERGENCY DEPARTMENT Provider Note   CSN: [id number] Arrival date & time: 05/05/21  0149     History  Chief Complaint  Patient presents with   Dizziness    Manuel Chapman is a 76 y.o. male.  The history is provided by the patient, medical records and the EMS personnel. No language interpreter was used.  Dizziness Quality:  Head spinning and lightheadedness Severity:  Severe Onset quality:  Gradual Duration:  2 days Timing:  Constant Progression:  Waxing and waning Chronicity:  Recurrent Relieved by:  Nothing Worsened by:  Movement Ineffective treatments:  None tried Associated symptoms: nausea and vomiting   Associated symptoms: no chest pain, no diarrhea, no headaches, no palpitations, no shortness of breath, no syncope, no vision changes and no weakness   Risk factors: hx of stroke       Home Medications Prior to Admission medications   Medication Sig Start Date End Date Taking? Authorizing Provider  acetaminophen (TYLENOL) 325 MG tablet Take 2 tablets (650 mg total) by mouth every 4 (four) hours as needed for mild pain (or temp > 37.5 C (99.5 F)). 12/04/19   [name], NP  allopurinol (ZYLOPRIM) 300 MG tablet Take 300 mg by mouth daily.    [provider]  amLODipine (NORVASC) 10 MG tablet Take 1 tablet (10 mg total) by mouth daily. 12/05/19   [name], NP  aspirin 81 MG chewable tablet Chew 1 tablet (81 mg total) by mouth daily. 12/04/19   [name], NP  atorvastatin (LIPITOR) 20 MG tablet Take 1 tablet (20 mg total) by mouth daily. 12/05/19   [name], NP  glimepiride (AMARYL) 1 MG tablet Take 1 tablet (1 mg total) by mouth daily. 12/05/19   [name], NP  insulin aspart (NOVOLOG) 100 UNIT/ML injection Inject 0-5 Units into the skin at bedtime. 12/04/19   [name], NP  insulin aspart (NOVOLOG) 100 UNIT/ML injection Inject 0-9 Units into the skin 3 (three) times  daily with meals. 12/04/19   [name], NP  insulin detemir (LEVEMIR) 100 UNIT/ML injection Inject 0.15 mLs (15 Units total) into the skin 2 (two) times daily. 12/04/19   [name], NP  lisinopril (ZESTRIL) 40 MG tablet Take 1 tablet (40 mg total) by mouth daily. 12/05/19   [name], NP  metoprolol succinate (TOPROL-XL) 50 MG 24 hr tablet Take 1 tablet (50 mg total) by mouth daily. Take with or immediately following a meal. 12/05/19   [name], NP      Allergies    Patient has no known allergies.    Review of Systems   Review of Systems  Constitutional:  Negative for chills, fatigue and fever.  HENT:  Negative for congestion.   Eyes:  Negative for visual disturbance.  Respiratory:  Negative for cough, chest tightness, shortness of breath and wheezing.   Cardiovascular:  Negative for chest pain, palpitations and syncope.  Gastrointestinal:  Positive for nausea and vomiting. Negative for abdominal pain, constipation and diarrhea.  Genitourinary:  Negative for dysuria, flank pain and frequency.  Musculoskeletal:  Negative for back pain.  Skin:  Negative for rash and wound.  Neurological:  Positive for dizziness and light-headedness. Negative for syncope, weakness, numbness and headaches.  Psychiatric/Behavioral:  Negative for agitation.   All other systems reviewed and are negative.  Physical Exam Updated Vital Signs BP (!) 196/88    Pulse 74    Temp  98 F (36.7 C) (Oral)    Resp 20    SpO2 96%  Physical Exam Vitals and nursing note reviewed.  Constitutional:      General: He is not in acute distress.    Appearance: He is well-developed. He is not ill-appearing, toxic-appearing or diaphoretic.  HENT:     Head: Normocephalic and atraumatic.     Nose: No congestion or rhinorrhea.     Mouth/Throat:     Mouth: Mucous membranes are moist.     Pharynx: No oropharyngeal exudate or posterior oropharyngeal erythema.  Eyes:     Extraocular Movements:  Extraocular movements intact.     Conjunctiva/sclera: Conjunctivae normal.     Pupils: Pupils are equal, round, and reactive to light.  Neck:     Vascular: No carotid bruit.  Cardiovascular:     Rate and Rhythm: Normal rate and regular rhythm.     Heart sounds: No murmur heard. Pulmonary:     Effort: Pulmonary effort is normal. No respiratory distress.     Breath sounds: Normal breath sounds. No wheezing, rhonchi or rales.  Chest:     Chest wall: No tenderness.  Abdominal:     General: Abdomen is flat.     Palpations: Abdomen is soft.     Tenderness: There is no abdominal tenderness. There is no guarding or rebound.  Musculoskeletal:        General: No swelling or tenderness.     Cervical back: Neck supple. No tenderness.  Skin:    General: Skin is warm and dry.     Capillary Refill: Capillary refill takes less than 2 seconds.     Findings: No erythema.  Neurological:     Mental Status: He is alert.     Cranial Nerves: No cranial nerve deficit.     Sensory: No sensory deficit.     Motor: No weakness.  Psychiatric:        Mood and Affect: Mood normal.    ED Results / Procedures / Treatments   Labs (all labs ordered are listed, but only abnormal results are displayed) Labs Reviewed  CBC WITH DIFFERENTIAL/PLATELET - Abnormal; Notable for the following components:      Result Value   WBC 11.4 (*)    RBC 3.53 (*)    Hemoglobin 10.5 (*)    HCT 31.5 (*)    Neutro Abs 8.6 (*)    All other components within normal limits  URINALYSIS, ROUTINE W REFLEX MICROSCOPIC - Abnormal; Notable for the following components:   Glucose, UA >=500 (*)    Hgb urine dipstick MODERATE (*)    Protein, ur >300 (*)    All other components within normal limits  BETA-HYDROXYBUTYRIC ACID - Abnormal; Notable for the following components:   Beta-Hydroxybutyric Acid 0.47 (*)    All other components within normal limits  COMPREHENSIVE METABOLIC PANEL - Abnormal; Notable for the following components:    Sodium 134 (*)    Glucose, Bld 300 (*)    Calcium 7.6 (*)    Total Protein 5.7 (*)    Albumin 2.4 (*)    Total Bilirubin 1.4 (*)    All other components within normal limits  I-STAT VENOUS BLOOD GAS, ED - Abnormal; Notable for the following components:   pH, Ven 7.445 (*)    pCO2, Ven 39.3 (*)    pO2, Ven 50.0 (*)    Acid-Base Excess 3.0 (*)    Calcium, Ion 1.05 (*)    HCT 37.0 (*)  Hemoglobin 12.6 (*)    All other components within normal limits  RESP PANEL BY RT-PCR (FLU A&B, COVID) ARPGX2  URINE CULTURE  TSH  URINALYSIS, MICROSCOPIC (REFLEX)  BLOOD GAS, VENOUS    EKG EKG Interpretation  Date/Time:  Tuesday May 05 2021 02:05:03 EST Ventricular Rate:  99 PR Interval:  194 QRS Duration: 107 QT Interval:  377 QTC Calculation: 484 R Axis:   -67 Text Interpretation: Sinus or ectopic atrial rhythm Left anterior fascicular block Abnormal R-wave progression, late transition Probable left ventricular hypertrophy Nonspecific T abnormalities, lateral leads Borderline prolonged QT interval when compared to prior, faster rate with prolonged PR. No STEMI Confirmed by Theda Belfast ([id number]) on 05/05/2021 2:16:30 AM  Radiology CT HEAD WO CONTRAST ( )  Result Date: 05/05/2021 CLINICAL DATA:  Dizziness, persistent or recurrent with cardiac or vascular cause suspected EXAM: CT HEAD WITHOUT CONTRAST TECHNIQUE: Contiguous axial images were obtained from the base of the skull through the vertex without intravenous contrast. COMPARISON:  Brain MRI 11/29/2019 FINDINGS: Brain: Large low-density in the right cerebellum which reaches the surface, more consistent with acute infarct than vasogenic edema. Most of the hemisphere is involved. The fourth ventricle remains patent. There is lateral ventriculomegaly that is chronic with callosal angle narrowing but no convincing normal pressure hydrocephalus. Chronic small vessel ischemia in the hemispheric white matter and also in the pons by MRI.  Vascular: No hyperdense vessel or unexpected calcification. Skull: Normal. Negative for fracture or focal lesion. Sinuses/Orbits: No acute finding. IMPRESSION: 1. Large low-density in the right cerebellum, favor recent infarct over vasogenic edema. Recommend MR characterization. 2. Patent fourth ventricle.  No obstructive hydrocephalus. 3. Advanced chronic small vessel disease. Electronically Signed   By: Tiburcio Pea M.D.   On: 05/05/2021 06:25   DG Chest Portable 1 View  Result Date: 05/05/2021 CLINICAL DATA:  Dizziness. EXAM: PORTABLE CHEST 1 VIEW COMPARISON:  Chest radiograph dated 11/28/2019. FINDINGS: Cardiomegaly with vascular congestion. No focal consolidation, pleural effusion or pneumothorax. Atherosclerotic calcification of the aorta. Degenerative changes of spine. No acute osseous pathology. IMPRESSION: Cardiomegaly with vascular congestion. No focal consolidation. Electronically Signed   By: Elgie Collard M.D.   On: 05/05/2021 02:56    Procedures Procedures    Medications Ordered in ED Medications  ondansetron (ZOFRAN) injection 4 mg (4 mg Intravenous Given 05/05/21 0215)  labetalol (NORMODYNE) injection 10 mg (10 mg Intravenous Given 05/05/21 0234)  labetalol (NORMODYNE) injection 10 mg (10 mg Intravenous Given 05/05/21 0531)    ED Course/ Medical Decision Making/ A&P                           Medical Decision Making  Cesar Toves is a 76 y.o. male with a past medical history significant for diabetes, hypertension, previous intracerebral hemorrhage, gout, and previous anemia who presents with several days of dizziness, lightheadedness, and nausea and vomiting.  According to patient, patient felt this dizziness when he had this previous stroke.  He reports that he has had some dizziness and lightheaded for the last few days but worsened acutely today.  He reports nausea and vomiting especially with movement per EMS.  His glucose was found to be elevated and not low.   Patient otherwise denies headache, neck pain, neck stiffness, fevers, chills congestion, cough, constipation, diarrhea, or urinary changes.  Denies medication changes.  Denies trauma.  Otherwise reports he is feeling well.  On exam, lungs are clear and chest is nontender.  No  murmur.  Abdomen nontender.  Patient has intact sensation and strength in all extremities and had normal finger-nose-finger testing bilaterally.  When smiling, he had symmetric smile and sensation of face was symmetric.  Pupils symmetric and reactive normal extraocular movements.  Denies any vision changes or speech difficulties at this time.  Patient had urinated on himself and has dried emesis on his clothes.  Clinically I am somewhat concerned that patient could have had recurrent stroke given this reported elevated blood pressure per EMS and the recurrent dizziness that felt similar to prior CVA.  Will get CT head and screening labs.  We will also look for DKA given the reported elevated glucose.  We will give some nausea medicine.  Given his reported persistent dizziness and similar to prior stroke, if CT is reassuring, anticipate he may need MRI.  Anticipate reassessment after work-up to determine disposition.  2:28 AM Vital signs are validated blood pressure was 204/118.  Given the concern for possible hypertensive emergency with previous hemorrhagic stroke and similar stroke symptoms, will give a dose of blood pressure medication to help decrease it slightly.  5:25 AM Was just informed by nursing the patient's blood pressure is again around [id number] systolic.  We will order more blood pressure medication.  They also report that patient urinated on himself while going to get his head CT so they brought him back, will make sure he gets his head CT promptly.  Also the lab canceled his metabolic panel and reordered it.  It is being recollected for further assessment.  6:38 AM Patient CT scan was finally collected and I reviewed  it independently initially and appears to show a cerebellar abnormality that could be infarct.  I quickly called radiology who agrees this appears to look like a cerebellar stroke versus edema.  They recommended MRI with and without contrast of the brain.  I called neurology who agrees he needs MRI with and without contrast to further delineate if this is stroke versus some sort of vasogenic edema related to a mass.  As the plan would change on if neurology needs to be reconsulted versus neurosurgery consulted, they feel he needs the MRI with and without before admission at this time.  Family agrees with work-up and MRI and and admission following.  If it is acute and ischemic stroke, neurology would recommend permissive hypertension so we will keep his blood pressure below 220 initially as there was no evidence of hemorrhage on the initial CT.  Care will be transferred to oncoming team while waiting for results of MRI followed by consultation and admission.         Final Clinical Impression(s) / ED Diagnoses Final diagnoses:  Dizziness  Nausea and vomiting, unspecified vomiting type     Clinical Impression: 1. Dizziness   2. Nausea and vomiting, unspecified vomiting type     Disposition: Care will be transferred to oncoming team while waiting for results of MRI followed by consultation and admission.  This note was prepared with assistance of Systems analyst. Occasional wrong-word or sound-a-like substitutions may have occurred due to the inherent limitations of voice recognition software.     Dierre Crevier, Gwenyth Allegra, MD 05/05/21 [phone]

## 2021-05-05 NOTE — ED Notes (Signed)
Pt to CT

## 2021-05-05 NOTE — Progress Notes (Signed)
Carotid duplex bilateral study completed.   Please see CV Proc for preliminary results.   Judi Jaffe, RDMS, RVT  

## 2021-05-05 NOTE — ED Notes (Signed)
Pt back from MRI at this time. Pt incontinent of stool and urine. Full linen change and peri care performed.

## 2021-05-05 NOTE — ED Provider Notes (Signed)
Seen after prior EDP.  Patient is MRI is suggestive of acute infarct.  Dr. Otelia Limes of neurology is aware of case and will evaluate as consult.  Hospitalist service\ is aware of case and will evaluate for admission.  Patient and patient's wife at bedside understand plan of care.   Wynetta Fines, MD 05/05/21 1021

## 2021-05-06 ENCOUNTER — Inpatient Hospital Stay (HOSPITAL_COMMUNITY): Payer: Medicare HMO

## 2021-05-06 DIAGNOSIS — E785 Hyperlipidemia, unspecified: Secondary | ICD-10-CM

## 2021-05-06 DIAGNOSIS — Z794 Long term (current) use of insulin: Secondary | ICD-10-CM

## 2021-05-06 DIAGNOSIS — I10 Essential (primary) hypertension: Secondary | ICD-10-CM

## 2021-05-06 DIAGNOSIS — E119 Type 2 diabetes mellitus without complications: Secondary | ICD-10-CM

## 2021-05-06 DIAGNOSIS — I639 Cerebral infarction, unspecified: Secondary | ICD-10-CM

## 2021-05-06 DIAGNOSIS — Z8673 Personal history of transient ischemic attack (TIA), and cerebral infarction without residual deficits: Secondary | ICD-10-CM

## 2021-05-06 DIAGNOSIS — E1159 Type 2 diabetes mellitus with other circulatory complications: Secondary | ICD-10-CM | POA: Diagnosis not present

## 2021-05-06 LAB — GLUCOSE, CAPILLARY
Glucose-Capillary: 101 mg/dL — ABNORMAL HIGH (ref 70–99)
Glucose-Capillary: 149 mg/dL — ABNORMAL HIGH (ref 70–99)
Glucose-Capillary: 53 mg/dL — ABNORMAL LOW (ref 70–99)
Glucose-Capillary: 76 mg/dL (ref 70–99)
Glucose-Capillary: 89 mg/dL (ref 70–99)
Glucose-Capillary: 96 mg/dL (ref 70–99)

## 2021-05-06 LAB — LIPID PANEL
Cholesterol: 218 mg/dL — ABNORMAL HIGH (ref 0–200)
HDL: 37 mg/dL — ABNORMAL LOW
LDL Cholesterol: 169 mg/dL — ABNORMAL HIGH (ref 0–99)
Total CHOL/HDL Ratio: 5.9 ratio
Triglycerides: 58 mg/dL
VLDL: 12 mg/dL (ref 0–40)

## 2021-05-06 LAB — URINE CULTURE

## 2021-05-06 LAB — HEMOGLOBIN A1C
Hgb A1c MFr Bld: 9.9 % — ABNORMAL HIGH (ref 4.8–5.6)
Mean Plasma Glucose: 237.43 mg/dL

## 2021-05-06 MED ORDER — INSULIN DETEMIR 100 UNIT/ML ~~LOC~~ SOLN
10.0000 [IU] | Freq: Two times a day (BID) | SUBCUTANEOUS | Status: DC
  Administered 2021-05-07 (×2): 10 [IU] via SUBCUTANEOUS
  Filled 2021-05-06 (×6): qty 0.1

## 2021-05-06 MED ORDER — HYDRALAZINE HCL 25 MG PO TABS
25.0000 mg | ORAL_TABLET | Freq: Three times a day (TID) | ORAL | Status: DC
  Administered 2021-05-06 – 2021-05-08 (×6): 25 mg via ORAL
  Filled 2021-05-06 (×6): qty 1

## 2021-05-06 MED ORDER — SODIUM CHLORIDE 0.9% FLUSH: 3.0000 mL | INTRAVENOUS | Status: DC | PRN

## 2021-05-06 MED ORDER — CLOPIDOGREL BISULFATE 75 MG PO TABS
75.0000 mg | ORAL_TABLET | Freq: Every day | ORAL | Status: DC
  Administered 2021-05-06 – 2021-05-09 (×4): 75 mg via ORAL
  Filled 2021-05-06 (×4): qty 1

## 2021-05-06 MED ORDER — SODIUM CHLORIDE 0.9% FLUSH
3.0000 mL | Freq: Two times a day (BID) | INTRAVENOUS | Status: DC
  Administered 2021-05-06 – 2021-05-09 (×6): 3 mL via INTRAVENOUS

## 2021-05-06 MED ORDER — ADULT MULTIVITAMIN W/MINERALS CH
1.0000 | ORAL_TABLET | Freq: Every day | ORAL | Status: DC
  Administered 2021-05-06 – 2021-05-09 (×4): 1 via ORAL
  Filled 2021-05-06 (×4): qty 1

## 2021-05-06 MED ORDER — IOHEXOL 350 MG/ML SOLN
70.0000 mL | Freq: Once | INTRAVENOUS | Status: AC | PRN
  Administered 2021-05-06: 70 mL via INTRAVENOUS

## 2021-05-06 MED ORDER — HYDRALAZINE HCL 20 MG/ML IJ SOLN
5.0000 mg | INTRAMUSCULAR | Status: DC | PRN
  Administered 2021-05-06 – 2021-05-09 (×5): 5 mg via INTRAVENOUS
  Filled 2021-05-06 (×5): qty 1

## 2021-05-06 MED ORDER — ENSURE ENLIVE PO LIQD
237.0000 mL | Freq: Two times a day (BID) | ORAL | Status: DC
  Administered 2021-05-06 – 2021-05-09 (×6): 237 mL via ORAL

## 2021-05-06 MED ORDER — ATORVASTATIN CALCIUM 80 MG PO TABS
80.0000 mg | ORAL_TABLET | Freq: Every day | ORAL | Status: DC
  Administered 2021-05-06 – 2021-05-09 (×4): 80 mg via ORAL
  Filled 2021-05-06 (×4): qty 1

## 2021-05-06 MED ORDER — HEPARIN SODIUM (PORCINE) 5000 UNIT/ML IJ SOLN
5000.0000 [IU] | Freq: Three times a day (TID) | INTRAMUSCULAR | Status: DC
  Administered 2021-05-06 – 2021-05-09 (×10): 5000 [IU] via SUBCUTANEOUS
  Filled 2021-05-06 (×10): qty 1

## 2021-05-06 NOTE — Progress Notes (Signed)
PROGRESS NOTE    Manuel Chapman  MWN:[id number] DOB: [birth year] DOA: 05/05/2021 PCP: Burton Apleyoberts, Ronald, MD    Chief Complaint  Patient presents with   Dizziness    Brief Narrative:   Manuel Chapman is a 76 y.o. male with medical history significant of HTN, DM, and ICH (11/2019) presenting with dizziness.   He noticed 3 days ago that he "know I had to come, I kept putting it off."  He has bene vomiting, HTN, sweaty head, dizziness.  Symptoms have "changed" over that time but he can't really describe how.  No N/W/T of arms/legs.  Denies dysarthria.  No dysphagia.  +headache.    CT abnormal, MRI with CVA.  Neurology will consult.  Admitted for further work-up, neurology recommended discontinue permissive hypertension.  Assessment & Plan:   Principal Problem:   Acute CVA (cerebrovascular accident) (HCC) Active Problems:   Diabetes (HCC)   HTN (hypertension)   Dyslipidemia  Acute CVA (cerebrovascular accident) (HCC)- (present on admission) -Patient with prior CVA and ICH presenting with several days of stroke-like symptoms -CT with cerebellar infarct; MRI confirms and there is evidence of mass effect on the 4th ventricle without obstruction -He is well out of the window for tPA -Agement per neurology, he is currently on dual antiplatelet therapy aspirin and Plavix, he is on high-dose Lipitor as well, no need for further permissive hypertension, will control blood pressure for target systolic 120 -  140. -2D echo with EF 45 to 50%, and grade 2 diastolic dysfunction, akinesis of left ventricle basal mid inferior lateral wall. -PT/OT/SLP consulted.   HTN -Out of the window for permissive HTN -SBP goal per neurology is 120-140 -Resume home meds - Toprol XL, lisinopril, chlorthalidone, remains uncontrolled, will add low-dose hydralazine -Will add prn IV hydralazine   HLD -LDL significantly elevated at 169, Lipitor was increased to 80 mg.   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 Disposition:   Status is: Inpatient  Remains inpatient appropriate because: CVA work up       Consultants:  neurology   Subjective:  No significant events overnight, patient unable to provide any complaints.  Objective: Vitals:   05/06/21 0700 05/06/21 0715 05/06/21 0850 05/06/21 1100  BP: (!) 176/78 (!) 157/68 (!) 159/74 (!) 153/68  Pulse: 65 68 73 64  Resp: 15 13 18 16   Temp:  (!) 97 F (36.1 C) (!) 97.5 F (36.4 C) 97.7 F (36.5 C)  TempSrc:   Oral Oral  SpO2: 96% 96%  97%  Weight:      Height:        Intake/Output Summary (Last 24 hours) at 05/06/2021 1403 Last data filed at 05/06/2021 0322 Gross per 24 hour  Intake 399.95 ml  Output --  Net 399.95 ml   Filed Weights   05/06/21 0300  Weight: 90.2 kg    Examination:   Sleeping comfortably, awake, confused, unable  to answer questions appropriately other than his name. Symmetrical Chest wall movement, Good air movement bilaterally, CTAB RRR,No Gallops,Rubs or new Murmurs, No Parasternal Heave +ve B.Sounds, Abd Soft, No tenderness, No rebound - guarding or rigidity. No Cyanosis, Clubbing or edema, No new Rash or bruise     Data Reviewed: I have personally reviewed following labs and imaging studies  CBC: Recent Labs  Lab 05/05/21 0210 05/05/21 0226  WBC 11.4*   --   NEUTROABS 8.6*  --   HGB 10.5* 12.6*  HCT 31.5* 37.0*  MCV 89.2  --   PLT 282  --     Basic Metabolic Panel: Recent Labs  Lab 05/05/21 0226 05/05/21 0538  NA 136 134*  K 3.7 4.0  CL  --  101  CO2  --  22  GLUCOSE  --  300*  BUN  --  12  CREATININE  --  0.79  CALCIUM  --  7.6*    GFR: Estimated Creatinine Clearance: 82.8 mL/min (by C-G formula based on SCr of 0.79 mg/dL).  Liver Function Tests: Recent Labs  Lab 05/05/21 0538  AST 28  ALT 10  ALKPHOS 98  BILITOT 1.4*  PROT 5.7*  ALBUMIN 2.4*    CBG: Recent Labs  Lab 05/05/21 1159 05/05/21 1827 05/06/21 0034 05/06/21 0854 05/06/21 1221  GLUCAP 323* 217* 149* 89 96     Recent Results (from the past 240 hour(s))  Urine Culture     Status: Abnormal   Collection Time: 05/05/21  2:02 AM   Specimen: Urine, Clean Catch  Result Value Ref Range Status   Specimen Description URINE, CLEAN CATCH  Final   Special Requests   Final    NONE Performed at [facility], [address], [place], [place] [address]    Culture MULTIPLE SPECIES PRESENT, SUGGEST RECOLLECTION (A)  Final   Report Status 05/06/2021 FINAL  Final  Resp Panel by RT-PCR (Flu A&B, Covid) Nasopharyngeal Swab     Status: None   Collection Time: 05/05/21  2:04 AM   Specimen: Nasopharyngeal Swab; Nasopharyngeal(NP) swabs in vial transport medium  Result Value Ref Range Status   SARS Coronavirus 2 by RT PCR NEGATIVE NEGATIVE Final    Comment: (NOTE) SARS-CoV-2 target nucleic acids are NOT DETECTED.  The SARS-CoV-2 RNA is generally detectable in upper respiratory specimens during the acute phase of infection. The lowest concentration of SARS-CoV-2 viral copies this assay can detect is 138 copies/mL. A negative result does not preclude SARS-Cov-2 infection and should not be used as the sole basis for treatment or other patient management decisions. A negative result may occur with  improper specimen collection/handling, submission of  specimen other than nasopharyngeal swab, presence of viral mutation(s) within the areas targeted by this assay, and inadequate number of viral copies(<138 copies/mL). A negative result must be combined with clinical observations, patient history, and epidemiological information. The expected result is Negative.  Fact Sheet for Patients:  BloggerCourse.com  Fact Sheet for Healthcare Providers:  SeriousBroker.it  This test is no t yet approved or cleared by the [place] FDA and  has been authorized for detection and/or diagnosis of SARS-CoV-2 by FDA under an Emergency Use Authorization (EUA). This EUA will remain  in effect (meaning this test can be used) for the duration of the COVID-19 declaration under Section 564(b)(1) of the Act, 21 U.S.C.section 360bbb-3(b)(1), unless the authorization is terminated  or revoked sooner.       Influenza A by PCR NEGATIVE NEGATIVE  Final   Influenza B by PCR NEGATIVE NEGATIVE Final    Comment: (NOTE) The Xpert Xpress SARS-CoV-2/FLU/RSV plus assay is intended as an aid in the diagnosis of influenza from Nasopharyngeal swab specimens and should not be used as a sole basis for treatment. Nasal washings and aspirates are unacceptable for Xpert Xpress SARS-CoV-2/FLU/RSV testing.  Fact Sheet for Patients: BloggerCourse.com  Fact Sheet for Healthcare Providers: SeriousBroker.it  This test is not yet approved or cleared by the [place] FDA and has been authorized for detection and/or diagnosis of SARS-CoV-2 by FDA under an Emergency Use Authorization (EUA). This EUA will remain in effect (meaning this test can be used) for the duration of the COVID-19 declaration under Section 564(b)(1) of the Act, 21 U.S.C. section 360bbb-3(b)(1), unless the authorization is terminated or revoked.  Performed at [facility], [address],  [place], [place] [address]          Radiology Studies: CT HEAD WO CONTRAST ( )  Result Date: 05/05/2021 CLINICAL DATA:  Follow-up stroke EXAM: CT HEAD WITHOUT CONTRAST TECHNIQUE: Contiguous axial images were obtained from the base of the skull through the vertex without intravenous contrast. RADIATION DOSE REDUCTION: This exam was performed according to the departmental dose-optimization program which includes automated exposure control, adjustment of the mA and/or kV according to patient size and/or use of iterative reconstruction technique. COMPARISON:  CT and MRI 05/05/2021 FINDINGS: Brain: Large low-density in the right cerebellum consistent with history of infarct. No hemorrhage identified. Similar degree of mass effect on fourth ventricle without evidence for obstruction. Atrophy and advanced chronic small vessel ischemic changes of the white matter. Stable ventricular size. Vascular: No hyperdense vessels.  Carotid vascular calcification Skull: Normal. Negative for fracture or focal lesion. Sinuses/Orbits: No acute finding. Other: None IMPRESSION: 1. Similar size and appearance of large low-density region in the right cerebellum consistent with history of infarct. No hemorrhage identified. Similar degree of mass effect on fourth ventricle without evidence for obstruction. 2. Atrophy and advanced chronic small vessel ischemic changes of the white matter. Electronically Signed   By: Jasmine Pang M.D.   On: 05/05/2021 23:32   CT HEAD WO CONTRAST ( )  Result Date: 05/05/2021 CLINICAL DATA:  Dizziness, persistent or recurrent with cardiac or vascular cause suspected EXAM: CT HEAD WITHOUT CONTRAST TECHNIQUE: Contiguous axial images were obtained from the base of the skull through the vertex without intravenous contrast. COMPARISON:  Brain MRI 11/29/2019 FINDINGS: Brain: Large low-density in the right cerebellum which reaches the surface, more consistent with acute infarct than vasogenic edema. Most  of the hemisphere is involved. The fourth ventricle remains patent. There is lateral ventriculomegaly that is chronic with callosal angle narrowing but no convincing normal pressure hydrocephalus. Chronic small vessel ischemia in the hemispheric white matter and also in the pons by MRI. Vascular: No hyperdense vessel or unexpected calcification. Skull: Normal. Negative for fracture or focal lesion. Sinuses/Orbits: No acute finding. IMPRESSION: 1. Large low-density in the right cerebellum, favor recent infarct over vasogenic edema. Recommend MR characterization. 2. Patent fourth ventricle.  No obstructive hydrocephalus. 3. Advanced chronic small vessel disease. Electronically Signed   By: Tiburcio Pea M.D.   On: 05/05/2021 06:25   MR ANGIO HEAD WO CONTRAST  Result Date: 05/06/2021 CLINICAL DATA:  Brain MRI and CT from yesterday EXAM: MRA HEAD WITHOUT CONTRAST TECHNIQUE: Angiographic images of the Circle of Willis were acquired using MRA technique without intravenous contrast. COMPARISON:  No pertinent prior exam. FINDINGS: Anterior circulation: Atheromatous irregularity  of the carotid siphons, especially on the right where there is 30% narrowing. Atheromatous irregularity of intracranial branches bilaterally. Downward pointing outpouching at the right supraclinoid carotid measuring up to 2 mm in size. Posterior circulation: The vertebral arteries are not covered. The covered basilar artery is widely patent. Minimal coverage of the right PICA vessel which is flowing where seen. Fetal type left PCA flow. No branch occlusion or visible aneurysm Anatomic variants: As above Other: None. IMPRESSION: 1. Non covered vertebral arteries and PICA branches. The covered posterior circulation vessels are widely patent. 2. Siphon atherosclerosis especially affecting the right cavernous carotid with mild stenosis. 3. 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 Electronically Signed   By: Tiburcio Pea M.D.    On: 05/06/2021 05:38   MR Brain W and Wo Contrast  Result Date: 05/05/2021 CLINICAL DATA:  Neuro deficit, acute, stroke suspected. Diabetes and hypertension. Abnormal head CT. EXAM: MRI HEAD WITHOUT AND WITH CONTRAST TECHNIQUE: Multiplanar, multiecho pulse sequences of the brain and surrounding structures were obtained without and with intravenous contrast. CONTRAST:  10mL GADAVIST GADOBUTROL 1 MMOL/ML IV SOLN COMPARISON:  Head CT same day.  MRI 11/29/2019. FINDINGS: Brain: Extensive region of acute infarction within the inferior cerebellum on the right, probably PICA distribution. Swelling and edema suggest that this is early subacute. Very minimal petechial blood products present without hematoma. No other acute infarction. Chronic small-vessel ischemic changes are seen throughout the pons. Confluent chronic small vessel ischemic changes are present throughout the cerebral hemispheric white matter. Old hemorrhagic infarction in the left posterior caudate body is evident. Chronic ventriculomegaly secondary to central atrophy. No extra-axial collection. Vascular: Major vessels at the base of the brain show flow. Skull and upper cervical spine: Negative Sinuses/Orbits: Clear/normal Other: None IMPRESSION: Large region of acute/early subacute infarction affecting the inferior cerebellum on the right consistent with right PICA territory infarction. Petechial blood products but no frank hematoma. Mass-effect upon the fourth ventricle without evidence of obstruction at this time. Chronic ventriculomegaly felt secondary to central atrophy. Extensive chronic small-vessel ischemic changes affecting the pons and cerebral hemispheric white matter. 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 Electronically Signed   By: Paulina Fusi M.D.   On: 05/05/2021 09:16   DG Chest Portable 1 View  Result Date: 05/05/2021 CLINICAL DATA:  Dizziness. EXAM: PORTABLE CHEST 1 VIEW COMPARISON:  Chest radiograph dated  11/28/2019. FINDINGS: Cardiomegaly with vascular congestion. No focal consolidation, pleural effusion or pneumothorax. Atherosclerotic calcification of the aorta. Degenerative changes of spine. No acute osseous pathology. IMPRESSION: Cardiomegaly with vascular congestion. No focal consolidation. Electronically Signed   By: Elgie Collard M.D.   On: 05/05/2021 02:56   ECHOCARDIOGRAM COMPLETE  Result Date: 05/05/2021    ECHOCARDIOGRAM REPORT   Patient Name:   SAXON BARICH Date of Exam: 05/05/2021 Medical Rec #:  [id number]             Height:       66.0 in Accession #:    [id number]            Weight:       226.2 lb Date of Birth:  December 11, 1945             BSA:          2.107 m Patient Age:    75 years              BP:           177/93 mmHg  Patient Gender: M                     HR:           76 bpm. Exam Location:  Inpatient Procedure: 2D Echo, Cardiac Doppler, Color Doppler and Intracardiac            Opacification Agent Indications:    Stroke  History:        Patient has prior history of Echocardiogram examinations, most                 recent 11/29/2019. Aortic Valve Disease; Risk Factors:Diabetes,                 Hypertension and Dyslipidemia.  Sonographer:    Vanetta Shawl Referring Phys: [id number] JENNIFER YATES IMPRESSIONS  1. Left ventricular ejection fraction, by estimation, is 45 to 50%. The left ventricle has mildly decreased function. The left ventricle demonstrates regional wall motion abnormalities (see scoring diagram/findings for description). There is moderate concentric left ventricular hypertrophy. Left ventricular diastolic parameters are consistent with Grade II diastolic dysfunction (pseudonormalization). Elevated left atrial pressure. There is akinesis of the left ventricular, basal-mid inferolateral wall.  2. Right ventricular systolic function is mildly reduced. The right ventricular size is mildly enlarged. Tricuspid regurgitation signal is inadequate for assessing PA pressure.  3. Left  atrial size was mildly dilated.  4. Right atrial size was mildly dilated.  5. The mitral valve is normal in structure. Mild mitral valve regurgitation.  6. The aortic valve is tricuspid. There is severe calcifcation of the aortic valve. There is severe thickening of the aortic valve. Aortic valve regurgitation is trivial. Severe aortic valve stenosis. Aortic valve mean gradient measures 30.9 mmHg. Aortic valve Vmax measures 4.04 m/s. Comparison(s): Prior images reviewed side by side. The left ventricular function is worsened. The right ventricular systolic function is worse. Aortic stenosis has worsened and is now severe. FINDINGS  Left Ventricle: Left ventricular ejection fraction, by estimation, is 45 to 50%. The left ventricle has mildly decreased function. The left ventricle demonstrates regional wall motion abnormalities. The left ventricular internal cavity size was normal in size. There is moderate concentric left ventricular hypertrophy. Left ventricular diastolic parameters are consistent with Grade II diastolic dysfunction (pseudonormalization). Elevated left atrial pressure.  LV Wall Scoring: The posterior wall is akinetic. Right Ventricle: The right ventricular size is mildly enlarged. Right vetricular wall thickness was not well visualized. Right ventricular systolic function is mildly reduced. Tricuspid regurgitation signal is inadequate for assessing PA pressure. Left Atrium: Left atrial size was mildly dilated. Right Atrium: Right atrial size was mildly dilated. Pericardium: There is no evidence of pericardial effusion. Mitral Valve: The mitral valve is normal in structure. Mild mitral valve regurgitation, with centrally-directed jet. Tricuspid Valve: The tricuspid valve is normal in structure. Tricuspid valve regurgitation is not demonstrated. Aortic Valve: The aortic valve is tricuspid. There is severe calcifcation of the aortic valve. There is severe thickening of the aortic valve. Aortic valve  regurgitation is trivial. Severe aortic stenosis is present. Aortic valve mean gradient measures 30.9 mmHg. Aortic valve peak gradient measures 65.2 mmHg. Aortic valve area, by VTI measures 0.58 cm. Pulmonic Valve: The pulmonic valve was grossly normal. Pulmonic valve regurgitation is not visualized. Aorta: The aortic root and ascending aorta are structurally normal, with no evidence of dilitation. IAS/Shunts: The interatrial septum was not well visualized.  LEFT VENTRICLE PLAX 2D LVIDd:         5.00 cm  Diastology LVIDs:         3.80 cm   LV e' medial:    4.00 cm/s LV PW:         1.10 cm   LV E/e' medial:  28.3 LV IVS:        1.60 cm   LV e' lateral:   4.87 cm/s LVOT diam:     1.90 cm   LV E/e' lateral: 23.2 LV SV:         58 LV SV Index:   27 LVOT Area:     2.84 cm  RIGHT VENTRICLE            IVC RV S prime:     7.31 cm/s  IVC diam: 2.40 cm LEFT ATRIUM         Index LA diam:    4.20 cm 1.99 cm/m  AORTIC VALVE AV Area (Vmax):    0.57 cm AV Area (Vmean):   0.64 cm AV Area (VTI):     0.58 cm AV Vmax:           403.81 cm/s AV Vmean:          244.780 cm/s AV VTI:            0.991 m AV Peak Grad:      65.2 mmHg AV Mean Grad:      30.9 mmHg LVOT Vmax:         80.93 cm/s LVOT Vmean:        55.267 cm/s LVOT VTI:          0.204 m LVOT/AV VTI ratio: 0.21  AORTA Ao Root diam: 3.10 cm Ao Asc diam:  2.85 cm MITRAL VALVE MV Area (PHT): 4.36 cm     SHUNTS MV Decel Time: 174 msec     Systemic VTI:  0.20 m MV E velocity: 113.00 cm/s  Systemic Diam: 1.90 cm MV A velocity: 125.00 cm/s MV E/A ratio:  0.90 Mihai Croitoru MD Electronically signed by Thurmon FairMihai Croitoru MD Signature Date/Time: 05/05/2021/5:18:53 PM    Final    VAS [facility] CAROTID ([facility])  Result Date: 05/06/2021 Carotid Arterial Duplex Study Patient Name:  Manuel Chapman  Date of Exam:   05/05/2021 Medical Rec #: [id number]              Accession #:    [id number] Date of Birth: 12/09/1945              Patient Gender: M Patient Age:   89 years [facility]  [facility] Procedure:      VAS US CAROTID Referring Phys: Jonah BlueJENNIFER YATES --------------------------------------------------------------------------------  Indications:  CVA. Risk Factors: Hypertension, hyperlipidemia, Diabetes, past history of smoking. Performing Technologist: Jean Rosenthalachel Hodge RDMS, RVT  Examination Guidelines: A complete evaluation includes B-mode imaging, spectral Doppler, color Doppler, and power Doppler as needed of all accessible portions of each vessel. Bilateral testing is considered an integral part of a complete examination. Limited examinations for reoccurring indications may be performed as noted.  Right Carotid Findings: +----------+--------+--------+--------+---------------------+------------------+             PSV cm/s EDV cm/s Stenosis Plaque Description    Comments            +----------+--------+--------+--------+---------------------+------------------+  CCA Prox   60       9                                                           +----------+--------+--------+--------+---------------------+------------------+  CCA Distal 50       7                                       intimal thickening  +----------+--------+--------+--------+---------------------+------------------+  ICA Prox   26       8        1-39%    hyperechoic and focal                     +----------+--------+--------+--------+---------------------+------------------+  ICA Distal 40       12                                                          +----------+--------+--------+--------+---------------------+------------------+  ECA        41                                                                   +----------+--------+--------+--------+---------------------+------------------+ +----------+--------+-------+----------------+-------------------+             PSV cm/s EDV cms Describe         Arm Pressure (mmHG)  +----------+--------+-------+----------------+-------------------+  Subclavian 64                Multiphasic, WNL                      +----------+--------+-------+----------------+-------------------+ +---------+--------+--+--------+-+---------+  Vertebral PSV cm/s 27 EDV cm/s 6 Antegrade  +---------+--------+--+--------+-+---------+  Left Carotid Findings: +----------+--------+--------+--------+------------------+--------+             PSV cm/s EDV cm/s Stenosis Plaque Description Comments  +----------+--------+--------+--------+------------------+--------+  CCA Prox   50       7                                              +----------+--------+--------+--------+------------------+--------+  CCA Distal 54       11                                             +----------+--------+--------+--------+------------------+--------+  ICA Prox   46       12                heterogenous mild            +----------+--------+--------+--------+------------------+--------+  ICA Distal 66       16                                             +----------+--------+--------+--------+------------------+--------+  ECA        51                                                      +----------+--------+--------+--------+------------------+--------+ +----------+--------+--------+----------------+-------------------+  PSV cm/s EDV cm/s Describe         Arm Pressure (mmHG)  +----------+--------+--------+----------------+-------------------+  Subclavian 83                Multiphasic, WNL                      +----------+--------+--------+----------------+-------------------+ +---------+--------+--+--------+-+---------+  Vertebral PSV cm/s 33 EDV cm/s 8 Antegrade  +---------+--------+--+--------+-+---------+   Summary: Right Carotid: Velocities in the right ICA are consistent with a 1-39% stenosis. Left Carotid: The extracranial vessels were near-normal with only minimal wall               thickening or plaque. Vertebrals:  Bilateral vertebral arteries demonstrate antegrade flow. Subclavians: Normal flow hemodynamics were  seen in bilateral subclavian              arteries. *See table(s) above for measurements and observations.  Electronically signed by Delia Heady MD on 05/06/2021 at 8:17:42 AM.    Final         Scheduled Meds:   stroke: mapping our early stages of recovery book   Does not apply Once   allopurinol  300 mg Oral Daily   aspirin  81 mg Oral Daily   atorvastatin  80 mg Oral Daily   chlorthalidone  25 mg Oral Daily   clopidogrel  75 mg Oral Daily   feeding supplement  237 mL Oral BID BM   insulin aspart  0-15 Units Subcutaneous TID WC   insulin detemir  15 Units Subcutaneous BID   lisinopril  40 mg Oral Daily   metoprolol succinate  50 mg Oral Daily   multivitamin with minerals  1 tablet Oral Daily   sodium chloride flush  3 mL Intravenous Q12H   Continuous Infusions:  sodium chloride 50 mL/hr at 05/06/21 0322     LOS: 1 day      Huey Bienenstock, MD [facility]   To contact the attending provider between 7A-7P or the covering provider during after hours 7P-7A, please log into the web site [email or web address] and access using universal [place] password for that web site. If you do not have the password, please call the hospital operator.  05/06/2021, 2:03 PM   Patient ID: Manuel Chapman, male   DOB: 1945/05/11, 76 y.o.   MRN: [id number]

## 2021-05-06 NOTE — Progress Notes (Addendum)
STROKE TEAM PROGRESS NOTE   ATTENDING NOTE: I reviewed above note and agree with the assessment and plan. Pt was seen and examined.   76 year old male with history of diabetes, hypertension, gout, stroke admitted for dizziness, nausea vomiting.  In 11/2019 patient was admitted for ICH and IVH, however further MRI showed had stroke at left frontal parietal, left CR and right temporal punctate infarcts with likely hemorrhagic transformation instead of ICH.  CTA head and neck unremarkable.  EF 55 to 60%.  LDL 90, A1c 13.3.  Put on aspirin 81 and Lipitor 20, discharge to SNF.  On current admission CT showed right cerebellar infarct, repeat CT stable.  MRI showed right PICA infarct with patient ventricles.  Carotid Doppler negative.  EF 40 to 45%.  CTA head and neck showed right ICA siphon mild to moderate stenosis.  UDS negative.  LDL 169, A1c 9.9.  Patient admitted not compliant with statin medication at home.  Creatinine 0.79  On exam, patient sitting in chair, no family at bedside.  Awake alert, orientated x3.  No aphasia, follows simple commands, able to name and repeat, however moderate dysarthria due to poor denture.  Moving all extremities symmetrically, sensation symmetrical, finger-to-nose and heel-to-shin grossly intact, no ataxia.  Etiology for patient stroke likely embolic pattern given no severe vascular abnormality at right vertebral artery.  However, patient does have multiple uncontrolled risk factors including diabetes and hyperlipidemia.  Patient declined loop recorder but okay with 30-day cardiac event monitoring as outpatient.  Recommend aspirin 81 and Plavix 75 DAPT for 3 weeks and then Plavix alone.  Continue Lipitor 80.  Aggressive stroke risk factor modification.  PT/OT recommend CIR.  For detailed assessment and plan, please refer to above as I have made changes wherever appropriate.   Neurology will sign off. Please call with questions. Pt will follow up with stroke clinic NP at  [facility] in about 4 weeks. Thanks for the consult.   Rosalin Hawking, MD PhD Stroke Neurology 05/06/2021 5:20 PM    INTERVAL HISTORY Patient is seen in his room with no family at the bedside.  He was admitted yesterday after having dizziness, nausea and vomiting for several days.  He was found to have a subacute right cerebellar stroke.  He states that he continues to have some dizziness, although it is improving, and that nausea makes it difficult for him to eat.  Vitals:   05/06/21 0700 05/06/21 0715 05/06/21 0850 05/06/21 1100  BP: (!) 176/78 (!) 157/68 (!) 159/74 (!) 153/68  Pulse: 65 68 73 64  Resp: 15 13 18 16   Temp:  (!) 97 F (36.1 C) (!) 97.5 F (36.4 C) 97.7 F (36.5 C)  TempSrc:   Oral Oral  SpO2: 96% 96%  97%  Weight:      Height:       CBC:  Recent Labs  Lab 05/05/21 0210 05/05/21 0226  WBC 11.4*  --   NEUTROABS 8.6*  --   HGB 10.5* 12.6*  HCT 31.5* 37.0*  MCV 89.2  --   PLT 282  --    Basic Metabolic Panel:  Recent Labs  Lab 05/05/21 0226 05/05/21 0538  NA 136 134*  K 3.7 4.0  CL  --  101  CO2  --  22  GLUCOSE  --  300*  BUN  --  12  CREATININE  --  0.79  CALCIUM  --  7.6*   Lipid Panel:  Recent Labs  Lab 05/06/21 0559  CHOL 218*  TRIG 58  HDL 37*  CHOLHDL 5.9  VLDL 12  LDLCALC 169*   HgbA1c:  Recent Labs  Lab 05/06/21 0559  HGBA1C 9.9*   Urine Drug Screen:  Recent Labs  Lab 05/05/21 1132  LABOPIA NONE DETECTED  COCAINSCRNUR NONE DETECTED  LABBENZ NONE DETECTED  AMPHETMU NONE DETECTED  THCU NONE DETECTED  LABBARB NONE DETECTED    Alcohol Level No results for input(s): ETH in the last 168 hours.  IMAGING past 24 hours CT HEAD WO CONTRAST (5MM)  Result Date: 05/05/2021 CLINICAL DATA:  Follow-up stroke EXAM: CT HEAD WITHOUT CONTRAST TECHNIQUE: Contiguous axial images were obtained from the base of the skull through the vertex without intravenous contrast. RADIATION DOSE REDUCTION: This exam was performed according to the  departmental dose-optimization program which includes automated exposure control, adjustment of the mA and/or kV according to patient size and/or use of iterative reconstruction technique. COMPARISON:  CT and MRI 05/05/2021 FINDINGS: Brain: Large low-density in the right cerebellum consistent with history of infarct. No hemorrhage identified. Similar degree of mass effect on fourth ventricle without evidence for obstruction. Atrophy and advanced chronic small vessel ischemic changes of the white matter. Stable ventricular size. Vascular: No hyperdense vessels.  Carotid vascular calcification Skull: Normal. Negative for fracture or focal lesion. Sinuses/Orbits: No acute finding. Other: None IMPRESSION: 1. Similar size and appearance of large low-density region in the right cerebellum consistent with history of infarct. No hemorrhage identified. Similar degree of mass effect on fourth ventricle without evidence for obstruction. 2. Atrophy and advanced chronic small vessel ischemic changes of the white matter. Electronically Signed   By: Donavan Foil M.D.   On: 05/05/2021 23:32   MR ANGIO HEAD WO CONTRAST  Result Date: 05/06/2021 CLINICAL DATA:  Brain MRI and CT from yesterday EXAM: MRA HEAD WITHOUT CONTRAST TECHNIQUE: Angiographic images of the Circle of Willis were acquired using MRA technique without intravenous contrast. COMPARISON:  No pertinent prior exam. FINDINGS: Anterior circulation: Atheromatous irregularity of the carotid siphons, especially on the right where there is 30% narrowing. Atheromatous irregularity of intracranial branches bilaterally. Downward pointing outpouching at the right supraclinoid carotid measuring up to 2 mm in size. Posterior circulation: The vertebral arteries are not covered. The covered basilar artery is widely patent. Minimal coverage of the right PICA vessel which is flowing where seen. Fetal type left PCA flow. No branch occlusion or visible aneurysm Anatomic variants: As  above Other: None. IMPRESSION: 1. Non covered vertebral arteries and PICA branches. The covered posterior circulation vessels are widely patent. 2. Siphon atherosclerosis especially affecting the right cavernous carotid with mild stenosis. 3. 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 Electronically Signed   By: Jorje Guild M.D.   On: 05/06/2021 05:38    PHYSICAL EXAM General:  Alert, well-developed, well-nourished male in no acute distress   NEURO:  Mental Status: AA&Ox3  Speech/Language: speech is without dysarthria or aphasia.  Naming, repetition, fluency, and comprehension intact.  Cranial Nerves:  II: PERRL. Visual fields full.  III, IV, VI: EOMI. Eyelids elevate symmetrically.  V: Sensation is intact to light touch and symmetrical to face.  VII: Smile is symmetrical.  VIII: hearing intact to voice. IX, X: Phonation is normal.  XII: tongue is midline without fasciculations. Motor: 5/5 strength to all muscle groups tested.  Sensation- Intact to light touch bilaterally.   Coordination: FTN intact bilaterally, HKS: no ataxia in BLE.No drift.  Gait- deferred   ASSESSMENT/PLAN Mr. Victorious Kimery is a  76 y.o. male with history of ICH, gout, DM and HTN presenting with dizziness, nausea and vomiting for several days.  He was found to have a subacute right cerebellar stroke.  He states that he continues to have some dizziness, although it is improving, and that nausea makes it difficult for him to eat.  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 fib. LDL 169 HgbA1c 9.9 VTE prophylaxis - SCDs aspirin 81 mg daily prior to admission, now on aspirin 81 mg daily and clopidogrel 75 mg daily DAPT for 3 weeks and then Plavix alone.  Therapy recommendations:  pending Disposition:  pending  Hypertension Home meds:  amlodipine 10 mg daily, chlorthalidone 25 mg daily, lisinopril 40 mg daily Stable Permissive hypertension (OK if < 220/120) but gradually normalize in 5-7 days Long-term BP goal normotensive  Hyperlipidemia Home meds: Not compliant with statin medication at home LDL 169, goal < 70 Increase atorvastatin to 80 mg daily  Continue statin at discharge  Diabetes type II Uncontrolled Home meds:  insulin detemir 15 units BID, metformin XR 1000 mg daily, pioglitazone 45 mg daily HgbA1c 9.9, goal < 7.0 CBGs Diabetes coordinator consult SSI  Other Stroke Risk Factors Advanced Age >/= 34  Former cigarette smoker Obesity, Body mass index is 31.15 kg/m., BMI >/= 30 associated with increased stroke risk, recommend weight loss, diet and exercise as appropriate  Hx stroke Congestive heart failure  Other Active Problems gout  Hospital day # Villarreal , MSN, AGACNP-BC [facility] See Amion for schedule and pager information 05/06/2021 3:15 PM    To contact Stroke Continuity provider, please refer to [email or web address] After hours, contact General Neurology

## 2021-05-06 NOTE — Progress Notes (Signed)
Initial Nutrition Assessment  DOCUMENTATION CODES:   Obesity unspecified  INTERVENTION:  - Encourage PO intake - Ensure Enlive po BID, each supplement provides 350 kcal and 20 grams of protein - MVI with minerals daily - Spoke with pt about balanced diet for diabetes and overall health management.  - Provided "Plate Method" in AVS  NUTRITION DIAGNOSIS:   Increased nutrient needs related to acute illness (CVA) as evidenced by estimated needs.  GOAL:   Patient will meet greater than or equal to 90% of their needs  MONITOR:   PO intake, Supplement acceptance, Labs, Weight trends  REASON FOR ASSESSMENT:   Consult Assessment of nutrition requirement/status  ASSESSMENT:   Pt admitted from home with dizziness secondary to acute CVA. PMH includes HTN, T2DM, and ICH.  Pt reports eating well PTA and no recent changes in appetite with the exception of vomiting yesterday. He was previously eating 2-3 meals per day consisting of eggs, grits and cereal for breakfast, and chicken, potatoes, and bread for dinner. His beverages typically include 2 large glasses of juice per day and soda. Addressed questions regarding carbohydrate intake for blood sugar control. We discussed how juice and large portions of carbohydrates increase blood sugar. We also spoke about balanced nutrition and limiting intake of sodium and fat, and when choosing carbohydrates, paying attention to portion size and whole grains.   Pt reports not having eaten anything since yesterday as he is afraid of vomiting again. Pt agreeable to receive Ensure during visit as he has had poor PO intake. RD provided menu for patient and discussed options for lunch. Noted pt ordered tomato basil soup, 2% milk and saltines for lunch.   Pt reports a usual weight of 198 lbs and denies recent weight loss. Unfortunately, there is limited documentation of weight history to confirm.  Medications: SSI, levemir 15 units BID  Labs: sodium 134,  ionized calcium 1.05  HgbA1c 9.9%, CBG's 89-323 x 24 hours  NUTRITION - FOCUSED PHYSICAL EXAM:  Flowsheet Row Most Recent Value  Orbital Region No depletion  Upper Arm Region No depletion  Thoracic and Lumbar Region No depletion  Buccal Region No depletion  Temple Region No depletion  Clavicle Bone Region No depletion  Clavicle and Acromion Bone Region No depletion  Scapular Bone Region No depletion  Dorsal Hand No depletion  Patellar Region No depletion  Anterior Thigh Region No depletion  Posterior Calf Region Mild depletion  Edema (RD Assessment) Mild  Hair Reviewed  Eyes Reviewed  Mouth Reviewed  Skin Reviewed  Nails Reviewed       Diet Order:   Diet Order             Diet Carb Modified Fluid consistency: Thin; Room service appropriate? Yes  Diet effective now                   EDUCATION NEEDS:   Education needs have been addressed  Skin:  Skin Assessment: Reviewed RN Assessment  Last BM:  1/10  Height:   Ht Readings from Last 1 Encounters:  05/06/21 5\' 7"  (1.702 m)    Weight:   Wt Readings from Last 1 Encounters:  05/06/21 90.2 kg    Ideal Body Weight:  67.2 kg  BMI:  Body mass index is 31.15 kg/m.  Estimated Nutritional Needs:   Kcal:  2000-2200  Protein:  100-115g  Fluid:  >/=2.0L  07/04/21, RDN, LDN Clinical Nutrition

## 2021-05-06 NOTE — Progress Notes (Signed)
? ?  Inpatient Rehab Admissions Coordinator : ? ?Per therapy recommendations, patient was screened for CIR candidacy by Revia Nghiem RN MSN.  At this time patient appears to be a potential candidate for CIR. I will place a rehab consult per protocol for full assessment. Please call me with any questions. ? ?Jatavian Calica RN MSN ?Admissions Coordinator ?[phone] ?  ?

## 2021-05-06 NOTE — Plan of Care (Signed)
Initiate Care Plan  Problem: Education: Goal: Knowledge of General Education information will improve Description: Including pain rating scale, medication(s)/side effects and non-pharmacologic comfort measures Outcome: Progressing   Problem: Health Behavior/Discharge Planning: Goal: Ability to manage health-related needs will improve Outcome: Progressing   Problem: Clinical Measurements: Goal: Ability to maintain clinical measurements within normal limits will improve Outcome: Progressing Goal: Will remain free from infection Outcome: Progressing Goal: Diagnostic test results will improve Outcome: Progressing Goal: Respiratory complications will improve Outcome: Progressing Goal: Cardiovascular complication will be avoided Outcome: Progressing   Problem: Activity: Goal: Risk for activity intolerance will decrease Outcome: Progressing   Problem: Nutrition: Goal: Adequate nutrition will be maintained Outcome: Progressing   Problem: Coping: Goal: Level of anxiety will decrease Outcome: Progressing   Problem: Elimination: Goal: Will not experience complications related to bowel motility Outcome: Progressing Goal: Will not experience complications related to urinary retention Outcome: Progressing   Problem: Pain Managment: Goal: General experience of comfort will improve Outcome: Progressing   Problem: Safety: Goal: Ability to remain free from injury will improve Outcome: Progressing   Problem: Skin Integrity: Goal: Risk for impaired skin integrity will decrease Outcome: Progressing   Problem: Education: Goal: Knowledge of disease or condition will improve Outcome: Progressing Goal: Knowledge of secondary prevention will improve (SELECT ALL) Outcome: Progressing Goal: Knowledge of patient specific risk factors will improve (INDIVIDUALIZE FOR PATIENT) Outcome: Progressing Goal: Individualized Educational Video(s) Outcome: Progressing

## 2021-05-06 NOTE — Evaluation (Signed)
Speech Language Pathology Evaluation Patient Details Name: Manuel Chapman MRN: [id number] DOB: 10-20-1945 Today's Date: 05/06/2021 Time: 1123-     Problem List:  Patient Active Problem List   Diagnosis Date Noted   Acute CVA (cerebrovascular accident) (HCC) 05/05/2021   Pressure injury of skin 11/30/2019   Hyperglycemia    Hypokalemia    Acute blood loss anemia    ICH (intracerebral hemorrhage) (HCC) 11/29/2019   Gout 05/10/2015   HTN (hypertension) 05/10/2015   Dyslipidemia 05/10/2015   Diabetes (HCC) 04/10/2015   Past Medical History:  Past Medical History:  Diagnosis Date   CVA (cerebral vascular accident) (HCC) 2020   Diabetes mellitus    Hypertension    ICH (intracerebral hemorrhage) (HCC) 11/2019   Past Surgical History: History reviewed. No pertinent surgical history. HPI:  76 y.o. male with a PMHx of prior ICH, gout, DM and HTN who presented to ED with a several day history of lightheadedness, dizziness with N/V. MRI brain: large region of acute/early subacute infarction affecting the inferior cerebellum on the right consistent with right PICA territory infarction. Extensive chronic small-vessel ischemic changes affecting the pons and cerebral hemispheric white matter. 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   Assessment / Plan / Recommendation Clinical Impression  Pt presents with mild dysarthria and deficits in selective attention, storage of new info for recall later, and awareness. He followed basic commands, had difficulty following multi-step commands.  Expressive/receptive language are functional. Speech is fluent. Dysarthria is quite mild and appears to fluctuate.  Pt's wife present for eval; they do not live together. Pt has full time  caregiver/friend, [name], who lives with him and provides 24 hour care.  Mr. Bayless spends most of his time sleeping and watching tv.  Recommend SLP f/u while admitted to address basic memory deficits.   OT/PT evals pending.    SLP Assessment  SLP Recommendation/Assessment: Patient needs continued Speech Lanaguage Pathology Services SLP Visit Diagnosis: Cognitive communication deficit (R41.841)    Recommendations for follow up therapy are one component of a multi-disciplinary discharge planning process, led by the attending physician.  Recommendations may be updated based on patient status, additional functional criteria and insurance authorization.    Follow Up Recommendations  Other (comment) (tba)    Assistance Recommended at Discharge  Frequent or constant Supervision/Assistance  Functional Status Assessment    Frequency and Duration min 2x/week  2 weeks      SLP Evaluation Cognition  Arousal/Alertness: Awake/alert Orientation Level: Oriented X4 Attention: Selective Selective Attention: Impaired Selective Attention Impairment: Verbal basic Memory: Impaired Memory Impairment: Storage deficit Awareness: Impaired Awareness Impairment: Intellectual impairment Safety/Judgment: Impaired       Comprehension  Auditory Comprehension Overall Auditory Comprehension: Appears within functional limits for tasks assessed Reading Comprehension Reading Status:  (pt with baseline literacy issues)    Expression Expression Primary Mode of Expression: Verbal   Oral / Motor  Oral Motor/Sensory Function Overall Oral Motor/Sensory Function: Mild impairment Facial Symmetry: Abnormal symmetry left;Suspected CN VII (facial) dysfunction Motor Speech Overall Motor Speech: Impaired Respiration: Within functional limits Phonation: Normal Resonance: Within functional limits Articulation: Within functional limitis Intelligibility: Intelligible Motor Planning: Witnin functional limits            [name] [name] 05/06/2021, 11:48 AM [name] L. Samson Frederic, MA CCC/SLP Acute Rehabilitation Services Office number [phone] Pager [phone]

## 2021-05-06 NOTE — TOC Initial Note (Signed)
Transition of Care [facility]) - Initial/Assessment Note    Patient Details  Name: Manuel Chapman MRN: [id number] Date of Birth: 30-Aug-1945  [facility] CM/SW Contact:    Harriet Masson, RN Phone Number: 05/06/2021, 2:37 PM  Clinical Narrative:                 Spoke to patient and wife, Elease Hashimoto, regarding transition needs. Patient lives with caregiver, [name]. He has 24/7 care. He has a walker, cane and scooter. [name] or family are able to take patient to appointments. TOC will continue to follow for discharge needs.   Expected Discharge Plan: IP Rehab Facility Barriers to Discharge: Continued Medical Work up   Patient Goals and CMS Choice Patient states their goals for this hospitalization and ongoing recovery are:: return home      Expected Discharge Plan and Services Expected Discharge Plan: IP Rehab Facility     Post Acute Care Choice: Durable Medical Equipment (has walker, can, scooter) Living arrangements for the past 2 months: Single Family Home                                      Prior Living Arrangements/Services Living arrangements for the past 2 months: Single Family Home Lives with:: Other (Comment) (caregiver) Patient language and need for interpreter reviewed:: Yes        Need for Family Participation in Patient Care: Yes (Comment) Care giver support system in place?: Yes (comment) Current home services: DME (walker, scooter, cane) Criminal Activity/Legal Involvement Pertinent to Current Situation/Hospitalization: No - Comment as needed  Activities of Daily Living Home Assistive Devices/Equipment: Blood pressure cuff ADL Screening (condition at time of admission) Patient's cognitive ability adequate to safely complete daily activities?: Yes Is the patient deaf or have difficulty hearing?: No Does the patient have difficulty seeing, even when wearing glasses/contacts?: No Does the patient have difficulty concentrating,  remembering, or making decisions?: No Patient able to express need for assistance with ADLs?: Yes Does the patient have difficulty dressing or bathing?: No Independently performs ADLs?: Yes (appropriate for developmental age) Does the patient have difficulty walking or climbing stairs?: No Weakness of Legs: Left Weakness of Arms/Hands: Left  Permission Sought/Granted                  Emotional Assessment Appearance:: Appears stated age Attitude/Demeanor/Rapport: Engaged Affect (typically observed): Accepting Orientation: : Oriented to Self, Oriented to Place, Oriented to  Time, Oriented to Situation Alcohol / Substance Use: Not Applicable Psych Involvement: No (comment)  Admission diagnosis:  Dizziness [R42] Acute CVA (cerebrovascular accident) (HCC) [I63.9] Cerebrovascular accident (CVA), unspecified mechanism (HCC) [I63.9] Nausea and vomiting, unspecified vomiting type [R11.2] Patient Active Problem List   Diagnosis Date Noted   Acute CVA (cerebrovascular accident) (HCC) 05/05/2021   Pressure injury of skin 11/30/2019   Hyperglycemia    Hypokalemia    Acute blood loss anemia    ICH (intracerebral hemorrhage) (HCC) 11/29/2019   Gout 05/10/2015   HTN (hypertension) 05/10/2015   Dyslipidemia 05/10/2015   Diabetes (HCC) 04/10/2015   PCP:  Burton Apley, MD Pharmacy:   [facility] [phone] - [place], [place] - [address] AT [facility] [address] [facility] [address] [place] [address] Phone: [phone] Fax: [phone]     Social Determinants of Health (SDOH) Interventions    Readmission Risk Interventions No flowsheet data found.

## 2021-05-06 NOTE — Evaluation (Signed)
Physical Therapy Evaluation Patient Details Name: Manuel Chapman MRN: [id number] DOB: [birth year] Today's Date: 05/06/2021  History of Present Illness  76 y.o. male with a PMHx of prior ICH, gout, DM and HTN who presented to ED with a several day history of lightheadedness, dizziness with N/V. MRI brain: large region of acute/early subacute infarction affecting the inferior cerebellum on the right consistent with right PICA territory infarction. Extensive chronic small-vessel ischemic changes affecting the pons and cerebral hemispheric white matter. 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   Clinical Impression  Pt admitted with above. Pt with noted both cognitive and functional impairments. Pt's wife arrived at end of session and reports he will have 24/7 assist provided by their friend [name] who lives with him. Wife resides with her son and reports going through treatments for lung cancer. Pt with decreased insight to safety and deficits in addition to dizziness with mobility and currently requiring assist x2 for transfers and ADls. Pt to benefit from CIR upon d/c to achieve safe level of function to return home with assist of [name]. Acute PT to cont to follow.       Recommendations for follow up therapy are one component of a multi-disciplinary discharge planning process, led by the attending physician.  Recommendations may be updated based on patient status, additional functional criteria and insurance authorization.  Follow Up Recommendations Acute inpatient rehab (3hours/day)    Assistance Recommended at Discharge Frequent or constant Supervision/Assistance  Patient can return home with the following  A little help with walking and/or transfers;A little help with bathing/dressing/bathroom;Assistance with cooking/housework;Direct supervision/assist for medications management;Direct supervision/assist for financial management;Assist for transportation;Help with  stairs or ramp for entrance    Equipment Recommendations  (pt has RW)  Recommendations for Other Services  Rehab consult    Functional Status Assessment Patient has had a recent decline in their functional status and demonstrates the ability to make significant improvements in function in a reasonable and predictable amount of time.     Precautions / Restrictions Precautions Precautions: Fall Precaution Comments: reports dizziness Restrictions Weight Bearing Restrictions: No      Mobility  Bed Mobility Overal bed mobility: Needs Assistance Bed Mobility: Supine to Sit     Supine to sit: Mod assist;+2 for physical assistance     General bed mobility comments: required encouragement to sit EOB, mod A to brig LEs to EOB/Off EOB and to elevate trunk    Transfers Overall transfer level: Needs assistance Equipment used: 2 person hand held assist Transfers: Sit to/from Stand;Bed to chair/wheelchair/BSC Sit to Stand: Mod assist;+2 physical assistance;+2 safety/equipment Stand pivot transfers: Mod assist;+2 physical assistance;+2 safety/equipment         General transfer comment: verbal and physical cues for sequencng and direction, pt with increased push through R side compared to L, reports dizziness with mobility but better than it was    Ambulation/Gait               General Gait Details: unable this date  Stairs            Wheelchair Mobility    Modified Rankin (Stroke Patients Only) Modified Rankin (Stroke Patients Only) Pre-Morbid Rankin Score: Slight disability Modified Rankin: Moderately severe disability     Balance Overall balance assessment: Needs assistance Sitting-balance support: No upper extremity supported;Feet supported Sitting balance-Leahy Scale: Fair     Standing balance support: Bilateral upper extremity supported;During functional activity Standing balance-Leahy Scale: Poor  Pertinent Vitals/Pain Pain Assessment: No/denies pain    Home Living Family/patient expects to be discharged to:: Private residence Living Arrangements: Spouse/significant other (pt's wife arrived at end of session (they do not live together)) Available Help at Discharge: Available 24 hours/day Type of Home: House Home Access: Stairs to enter Entrance Stairs-Rails: None Entrance Stairs-Number of Steps: 1   Home Layout: One level Home Equipment: Cane - single Librarian, academicpoint;Rolling Walker (2 wheels)      Prior Function Prior Level of Function : Needs assist       Physical Assist : ADLs (physical)   ADLs (physical): Bathing;IADLs Mobility Comments: pt used RW for mobility ADLs Comments: Pt's wife reports that pt's caregiver helped him with bathing, meal prep and medication mgt; pt was able to feed, dress, groom and toilet himself     Hand Dominance   Dominant Hand: Right    Extremity/Trunk Assessment   Upper Extremity Assessment Upper Extremity Assessment: Defer to OT evaluation RUE Deficits / Details: 3+/5 grosly LUE Deficits / Details: 3/5 grossly LUE Coordination: decreased fine motor;decreased gross motor    Lower Extremity Assessment Lower Extremity Assessment: Generalized weakness    Cervical / Trunk Assessment Cervical / Trunk Assessment: Normal  Communication   Communication: Expressive difficulties (dysarthric speech)  Cognition Arousal/Alertness: Awake/alert Behavior During Therapy: Flat affect Overall Cognitive Status: Impaired/Different from baseline Area of Impairment: Attention;Memory;Following commands;Safety/judgement;Awareness;Problem solving                   Current Attention Level: Focused Memory: Decreased short-term memory Following Commands: Follows multi-step commands consistently Safety/Judgement: Decreased awareness of safety;Decreased awareness of deficits Awareness: Emergent Problem Solving: Slow processing;Decreased  initiation;Difficulty sequencing;Requires verbal cues;Requires tactile cues General Comments: verbal cues to keep eyes open, pt difficulty to understand due to dysarthric speech, pt spoke of 2 daughters and caregiver but never spoke of wife who showed up to his room at end of therapy eval, wife does report she lives at a different house, pt not motivated and appears to live sedentary life. Pt with noted inconsistencies in report of support and home set up        General Comments General comments (skin integrity, edema, etc.): vss, BP was 164, RN notified it was above 120-140 range    Exercises     Assessment/Plan    PT Assessment Patient needs continued PT services  PT Problem List Decreased strength;Decreased range of motion;Decreased activity tolerance;Decreased balance;Decreased mobility;Decreased coordination;Decreased cognition;Decreased knowledge of use of DME;Decreased safety awareness       PT Treatment Interventions DME instruction;Gait training;Stair training;Functional mobility training;Therapeutic activities;Therapeutic exercise;Balance training;Neuromuscular re-education    PT Goals (Current goals can be found in the Care Plan section)  Acute Rehab PT Goals Patient Stated Goal: home PT Goal Formulation: With patient/family Time For Goal Achievement: 05/20/21 Potential to Achieve Goals: Good    Frequency Min 4X/week     Co-evaluation PT/OT/SLP Co-Evaluation/Treatment: Yes Reason for Co-Treatment: To address functional/ADL transfers PT goals addressed during session: Mobility/safety with mobility OT goals addressed during session: ADL's and self-care;Proper use of Adaptive equipment and DME       AM-PAC PT "6 Clicks" Mobility  Outcome Measure Help needed turning from your back to your side while in a flat bed without using bedrails?: A Lot Help needed moving from lying on your back to sitting on the side of a flat bed without using bedrails?: A Lot Help needed  moving to and from a bed to a chair (including a wheelchair)?: A Lot  Help needed standing up from a chair using your arms (e.g., wheelchair or bedside chair)?: A Lot Help needed to walk in hospital room?: A Lot Help needed climbing 3-5 steps with a railing? : Total 6 Click Score: 11    End of Session Equipment Utilized During Treatment: Gait belt Activity Tolerance: Patient tolerated treatment well Patient left: in chair;with call bell/phone within reach;with chair alarm set;with family/visitor present Nurse Communication: Mobility status PT Visit Diagnosis: Unsteadiness on feet (R26.81);Muscle weakness (generalized) (M62.81);Difficulty in walking, not elsewhere classified (R26.2)    Time: 8546-2703 PT Time Calculation (min) (ACUTE ONLY): 27 min   Charges:   PT Evaluation $PT Eval Moderate Complexity: 1 Mod          Lewis Shock, PT, DPT Acute Rehabilitation Services Pager #: [phone] Office #: [phone]   Iona Hansen 05/06/2021, 4:00 PM

## 2021-05-06 NOTE — Progress Notes (Addendum)
Inpatient Diabetes Program Recommendations  AACE/ADA: New Consensus Statement on Inpatient Glycemic Control (2015)  Target Ranges:  Prepandial:   less than 140 mg/dL      Peak postprandial:   less than 180 mg/dL (1-2 hours)      Critically ill patients:  140 - 180 mg/dL   Lab Results  Component Value Date   GLUCAP 89 05/06/2021   HGBA1C 9.9 (H) 05/06/2021    Review of Glycemic Control  Latest Reference Range & Units 05/05/21 18:27 05/06/21 00:34  Glucose-Capillary 70 - 99 mg/dL 974 (H) 163 (H)   Diabetes history: DM 2 Outpatient Diabetes medications:  Amaryl 2 mg daily, Levemir 15 units bid, Metformin 1000 mg daily, Actos 45 mg daily Current orders for Inpatient glycemic control:  Novolog 0-15 units tid with meals Levemir 15 units bid Inpatient Diabetes Program Recommendations:    Consider reducing Levemir to 10 units bid.  Likely needs insulin restarted at home- unsure why patient stopped taking?  Thanks,  Beryl Meager, RN, BC-ADM Inpatient Diabetes Coordinator Pager [phone]  (8a-5p)

## 2021-05-06 NOTE — Discharge Instructions (Signed)

## 2021-05-06 NOTE — Progress Notes (Signed)
°  Transition of Care [facility]) Screening Note   Patient Details  Name: Efford Denio Date of Birth: 27-May-1945   Transition of Care [facility]) CM/SW Contact:    Cyndi Bender, RN Phone Number: 05/06/2021, 8:31 AM    Transition of Care Department [facility]) has reviewed patient and will continue to follow for therapy notes.   Patient lives with at home with caregiver,  Coralyn Mark, [phone], [phone]

## 2021-05-06 NOTE — Evaluation (Signed)
Occupational Therapy Evaluation Patient Details Name: Manuel Chapman MRN: [id number] DOB: 10/13/1945 Today's Date: 05/06/2021   History of Present Illness 76 y.o. male with a PMHx of prior ICH, gout, DM and HTN who presented to ED with a several day history of lightheadedness, dizziness with N/V. MRI brain: large region of acute/early subacute infarction affecting the inferior cerebellum on the right consistent with right PICA territory infarction. Extensive chronic small-vessel ischemic changes affecting the pons and cerebral hemispheric white matter. 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   Clinical Impression   Pt presents with decline in function and safety with ADLs and ADL mobility with impaired strength, balance, endurance and cognition. Uncertain of pt's baseline cognition; pt demos deficits in selective attention, storage of new info for recall later, awareness and had difficulty following multi-step commands. PTA pt lived at home with a fullt tie caregiver named [name]. Pt with conflicting info about who lives with him and where his daughters live and with orientation questions. Pt's wife arrived at end of session and reports that she does not live with the pt and she confirms that friend/caregiver [name] does live with the pt and that he assists pt with meals, bathing and medication mgt; pt uses a RW for mobility. Pt currently requires mod A +2 for bed mobility to sit EOB and for sit - stand. SPTs and max A with LB ADLs. Pt would benefit from acute OT services to address impairments to maximize level of function and safety     Recommendations for follow up therapy are one component of a multi-disciplinary discharge planning process, led by the attending physician.  Recommendations may be updated based on patient status, additional functional criteria and insurance authorization.   Follow Up Recommendations  Acute inpatient rehab (3hours/day)    Assistance  Recommended at Discharge Frequent or constant Supervision/Assistance  Patient can return home with the following A lot of help with bathing/dressing/bathroom;A lot of help with walking and/or transfers;Assist for transportation;Direct supervision/assist for financial management;Direct supervision/assist for medications management    Functional Status Assessment  Patient has had a recent decline in their functional status and demonstrates the ability to make significant improvements in function in a reasonable and predictable amount of time.  Equipment Recommendations  None recommended by OT    Recommendations for Other Services Rehab consult     Precautions / Restrictions Precautions Precautions: Fall Precaution Comments: reports dizziness Restrictions Weight Bearing Restrictions: No      Mobility Bed Mobility Overal bed mobility: Needs Assistance Bed Mobility: Supine to Sit     Supine to sit: Mod assist;+2 for physical assistance     General bed mobility comments: required encouragement to sit EOB, mod A to brig LEs to EOB/Off EOB and to elevate trunk    Transfers Overall transfer level: Needs assistance Equipment used: 2 person hand held assist Transfers: Sit to/from Stand;Bed to chair/wheelchair/BSC Sit to Stand: Mod assist;+2 physical assistance;+2 safety/equipment Stand pivot transfers: Mod assist;+2 physical assistance;+2 safety/equipment         General transfer comment: verbal and physical cues for sequencng and direction      Balance Overall balance assessment: Needs assistance Sitting-balance support: No upper extremity supported;Feet supported Sitting balance-Leahy Scale: Fair     Standing balance support: Bilateral upper extremity supported;During functional activity Standing balance-Leahy Scale: Poor  ADL either performed or assessed with clinical judgement   ADL Overall ADL's : Needs  assistance/impaired Eating/Feeding: Set up;Supervision/ safety;Sitting   Grooming: Wash/dry hands;Wash/dry face;Min guard;Sitting   Upper Body Bathing: Moderate assistance;Sitting   Lower Body Bathing: Maximal assistance   Upper Body Dressing : Moderate assistance;Sitting   Lower Body Dressing: Maximal assistance       Toileting- Clothing Manipulation and Hygiene: Maximal assistance               Vision Baseline Vision/History: 1 Wears glasses Ability to See in Adequate Light: 0 Adequate Patient Visual Report: No change from baseline       Perception     Praxis      Pertinent Vitals/Pain Pain Assessment: No/denies pain     Hand Dominance Right   Extremity/Trunk Assessment Upper Extremity Assessment Upper Extremity Assessment: Generalized weakness;LUE deficits/detail;RUE deficits/detail RUE Deficits / Details: 3+/5 grosly LUE Deficits / Details: 3/5 grossly LUE Coordination: decreased fine motor;decreased gross motor   Lower Extremity Assessment Lower Extremity Assessment: Defer to PT evaluation       Communication Communication Communication: Expressive difficulties (dysarthric speech)   Cognition Arousal/Alertness: Awake/alert Behavior During Therapy: Flat affect Overall Cognitive Status: Impaired/Different from baseline Area of Impairment: Attention;Memory;Following commands;Safety/judgement;Awareness;Problem solving                     Memory: Decreased short-term memory Following Commands: Follows multi-step commands consistently Safety/Judgement: Decreased awareness of safety;Decreased awareness of deficits   Problem Solving: Slow processing;Decreased initiation;Difficulty sequencing;Requires verbal cues;Requires tactile cues General Comments: deficits in selective attention, storage of new info for recall later, and awareness. He followed basic commands, had difficulty following multi-step commands.     General Comments        Exercises     Shoulder Instructions      Home Living Family/patient expects to be discharged to:: Private residence Living Arrangements: Spouse/significant other (pt's wife arrived at end of session (they do not live together))   Type of Home: House Home Access: Stairs to enter     Home Layout: One level     Bathroom Shower/Tub: Chief Strategy OfficerTub/shower unit   Bathroom Toilet: Standard     Home Equipment: Gilmer Morane - single Librarian, academicpoint;Rolling Walker (2 wheels)      Lives With: Other (Comment) [name](Terry, 24 hour caregiver)    Prior Functioning/Environment Prior Level of Function : Needs assist       Physical Assist : ADLs (physical)   ADLs (physical): Bathing;IADLs Mobility Comments: pt used RW for mobility ADLs Comments: Pt's wife reports that pt's caregiver helped him with bathing, meal prep and medication mgt; pt was able to feed, dress, groom and toilet himself        OT Problem List: Decreased strength;Impaired balance (sitting and/or standing);Decreased cognition;Decreased safety awareness;Decreased activity tolerance;Decreased coordination;Decreased knowledge of use of DME or AE      OT Treatment/Interventions: Self-care/ADL training;Patient/family education;Balance training;Therapeutic activities;DME and/or AE instruction;Therapeutic exercise;Neuromuscular education    OT Goals(Current goals can be found in the care plan section) Acute Rehab OT Goals Patient Stated Goal: "I want to go home" OT Goal Formulation: With patient/family Time For Goal Achievement: 05/20/21 Potential to Achieve Goals: Good ADL Goals Pt Will Perform Grooming: with supervision;with set-up;sitting Pt Will Perform Upper Body Bathing: with min assist;sitting Pt Will Perform Upper Body Dressing: with min assist;sitting Pt Will Transfer to Toilet: with mod assist;with min assist;ambulating;stand pivot transfer;bedside commode Pt Will Perform Toileting - Clothing Manipulation and hygiene: with mod assist;with min  assist;sit to/from stand Additional ADL Goal #1: Pt will complete bed mobility min A to sit EOB in prep for ADLs and transfers  OT Frequency: Min 2X/week    Co-evaluation PT/OT/SLP Co-Evaluation/Treatment: Yes Reason for Co-Treatment: For patient/therapist safety;To address functional/ADL transfers;Necessary to address cognition/behavior during functional activity   OT goals addressed during session: ADL's and self-care;Proper use of Adaptive equipment and DME      AM-PAC OT "6 Clicks" Daily Activity     Outcome Measure Help from another person eating meals?: A Little Help from another person taking care of personal grooming?: A Little Help from another person toileting, which includes using toliet, bedpan, or urinal?: A Lot Help from another person bathing (including washing, rinsing, drying)?: A Lot Help from another person to put on and taking off regular upper body clothing?: A Little Help from another person to put on and taking off regular lower body clothing?: A Lot 6 Click Score: 15   End of Session Nurse Communication: Mobility status  Activity Tolerance: Patient tolerated treatment well Patient left: in chair;with call bell/phone within reach;with chair alarm set  OT Visit Diagnosis: Unsteadiness on feet (R26.81);Other abnormalities of gait and mobility (R26.89);Muscle weakness (generalized) (M62.81);Other symptoms and signs involving cognitive function;Cognitive communication deficit (R41.841);Dizziness and giddiness (R42)                Time: 7824-2353 OT Time Calculation (min): 32 min Charges:  OT General Charges $OT Visit: 1 Visit OT Evaluation $OT Eval Moderate Complexity: 1 Mod    Galen Manila 05/06/2021, 3:22 PM

## 2021-05-07 DIAGNOSIS — I10 Essential (primary) hypertension: Secondary | ICD-10-CM | POA: Diagnosis not present

## 2021-05-07 DIAGNOSIS — E1159 Type 2 diabetes mellitus with other circulatory complications: Secondary | ICD-10-CM | POA: Diagnosis not present

## 2021-05-07 DIAGNOSIS — Z794 Long term (current) use of insulin: Secondary | ICD-10-CM | POA: Diagnosis not present

## 2021-05-07 DIAGNOSIS — I639 Cerebral infarction, unspecified: Secondary | ICD-10-CM | POA: Diagnosis not present

## 2021-05-07 LAB — CBC
HCT: 33.5 % — ABNORMAL LOW (ref 39.0–52.0)
Hemoglobin: 10.7 g/dL — ABNORMAL LOW (ref 13.0–17.0)
MCH: 28.4 pg (ref 26.0–34.0)
MCHC: 31.9 g/dL (ref 30.0–36.0)
MCV: 88.9 fL (ref 80.0–100.0)
Platelets: 265 10*3/uL (ref 150–400)
RBC: 3.77 MIL/uL — ABNORMAL LOW (ref 4.22–5.81)
RDW: 14.9 % (ref 11.5–15.5)
WBC: 7.9 10*3/uL (ref 4.0–10.5)
nRBC: 0 % (ref 0.0–0.2)

## 2021-05-07 LAB — GLUCOSE, CAPILLARY
Glucose-Capillary: 207 mg/dL — ABNORMAL HIGH (ref 70–99)
Glucose-Capillary: 131 mg/dL — ABNORMAL HIGH (ref 70–99)
Glucose-Capillary: 123 mg/dL — ABNORMAL HIGH (ref 70–99)
Glucose-Capillary: 200 mg/dL — ABNORMAL HIGH (ref 70–99)
Glucose-Capillary: 164 mg/dL — ABNORMAL HIGH (ref 70–99)
Glucose-Capillary: 81 mg/dL (ref 70–99)

## 2021-05-07 LAB — BASIC METABOLIC PANEL
Anion gap: 11 (ref 5–15)
BUN: 21 mg/dL (ref 8–23)
CO2: 25 mmol/L (ref 22–32)
Calcium: 7.9 mg/dL — ABNORMAL LOW (ref 8.9–10.3)
Chloride: 99 mmol/L (ref 98–111)
Creatinine, Ser: 1.2 mg/dL (ref 0.61–1.24)
GFR, Estimated: >60 mL/min (ref >60)
Glucose, Bld: 215 mg/dL — ABNORMAL HIGH (ref 70–99)
Potassium: 4 mmol/L (ref 3.5–5.1)
Sodium: 135 mmol/L (ref 135–145)

## 2021-05-07 LAB — MAGNESIUM: Magnesium: 1.8 mg/dL (ref 1.7–2.4)

## 2021-05-07 NOTE — PMR Pre-admission (Signed)
PMR Admission Coordinator Pre-Admission Assessment  Patient: Manuel Chapman is an 76 y.o., male MRN: [id number] DOB: Aug 28, 1945 Height: _0  (170.2 cm) Weight: 90.2 kg  Insurance Information HMO: Yes    PPO:       PCP:       IPA:       80/20:       OTHER: Gold Plus PRIMARY: [employer]      Policy#: [id number]      Subscriber: Patient CM Name: Thora Lance      Phone#: [phone]     Fax#: [phone] Pre-Cert#: [id number] Received insurance authorization on 05/08/21 from [place].      Employer: Retired Benefits:  Phone #: [phone]     Name: [email or web address] online Eff. Date: 04/26/21     Deduct: $0      Out of Pocket Max: $3400      Life Max: N/A CIR: $295/day for days 1-6 (max $1770/admission)      SNF: $0 days 1-20; $196 days 21-100 Outpatient: med nec     Co-Pay: $20/visit Home Health: 100%      Co-Pay: none DME: 80%     Co-Pay: 20% Providers: in network  SECONDARY:       Policy#:      Phone#:   Development worker, community:       Phone#:   The Actuary for patients in Inpatient Rehabilitation Facilities with attached Privacy Act [place] Records was provided and verbally reviewed with: Patient  Emergency Contact Information Contact Information     Name Relation Home Work [place]) Daughter [phone]  [phone]   Erving, Sassano Daughter [phone]  [phone]   Chong Sicilian Spouse          Current Medical History  Patient Admitting Diagnosis: R PICA cerebellar infarction  History of Present Illness: A 76 y.o. male with a PMHx of prior ICH, gout, DM and HTN who presented overnight with a several day history of lightheadedness, dizziness with N/V. His dizziness had acutely worsened on Monday, which prompted him to call EMS. His N/V was worsened with movement. He denied any headache, fevers/chills, cough, diarrhea, head trauma or neck stiffness. He had severe HTN in the  ED requiring antihypertensive medication.  CT head revealed a right cerebellar hypodensity most consistent with an ischemic stroke.  MRI brain was then obtained, confirming the presence of a subacute right cerebellar stroke.  Home medications include ASA and atorvastatin.  PT/OT/SLP evaluations completed with recommendations for acute inpatient rehab admission.  Complete NIHSS TOTAL: 2  Patient's medical record from [facility] has been reviewed by the rehabilitation admission coordinator and physician.  Past Medical History  Past Medical History:  Diagnosis Date   CVA (cerebral vascular accident) ([facility]) 2020   Diabetes mellitus    Hypertension    ICH (intracerebral hemorrhage) ([place]) 11/2019    Has the patient had major surgery during 100 days prior to admission? No  Family History   family history is not on file.  Current Medications  Current Facility-Administered Medications:     stroke: mapping our early stages of recovery book, , Does not apply, Once, Karmen Bongo, MD   0.9 %  sodium chloride infusion, , Intravenous, Continuous, Karmen Bongo, MD, Last Rate: 50 mL/hr at 05/07/21 0225, New Bag at 05/07/21 0225   acetaminophen (TYLENOL) tablet 650 mg, 650 mg, Oral, Q4H PRN **OR** acetaminophen (TYLENOL) 160 MG/5ML solution 650 mg, 650 mg, Per Tube, Q4H PRN **OR** acetaminophen (TYLENOL)  suppository 650 mg, 650 mg, Rectal, Q4H PRN, Karmen Bongo, MD   allopurinol (ZYLOPRIM) tablet 300 mg, 300 mg, Oral, Daily, Karmen Bongo, MD, 300 mg at 05/07/21 0093   aspirin chewable tablet 81 mg, 81 mg, Oral, Daily, Karmen Bongo, MD, 81 mg at 05/07/21 0823   atorvastatin (LIPITOR) tablet 80 mg, 80 mg, Oral, Daily, Rosalin Hawking, MD, 80 mg at 05/07/21 8182   chlorthalidone (HYGROTON) tablet 25 mg, 25 mg, Oral, Daily, Karmen Bongo, MD, 25 mg at 05/07/21 9937   clopidogrel (PLAVIX) tablet 75 mg, 75 mg, Oral, Daily, Rosalin Hawking, MD, 75 mg at 05/07/21 1696   feeding supplement (ENSURE  ENLIVE / ENSURE PLUS) liquid 237 mL, 237 mL, Oral, BID BM, Elgergawy, Silver Huguenin, MD, 237 mL at 05/07/21 1240   heparin injection 5,000 Units, 5,000 Units, Subcutaneous, Q8H, Elgergawy, Silver Huguenin, MD, 5,000 Units at 05/07/21 1239   hydrALAZINE (APRESOLINE) injection 5 mg, 5 mg, Intravenous, Q4H PRN, Elgergawy, Silver Huguenin, MD, 5 mg at 05/07/21 0827   hydrALAZINE (APRESOLINE) tablet 25 mg, 25 mg, Oral, Q8H, Elgergawy, Dawood S, MD, 25 mg at 05/07/21 1239   insulin aspart (novoLOG) injection 0-15 Units, 0-15 Units, Subcutaneous, TID WC, Karmen Bongo, MD, 3 Units at 05/07/21 1239   insulin detemir (LEVEMIR) injection 10 Units, 10 Units, Subcutaneous, BID, Elgergawy, Silver Huguenin, MD, 10 Units at 05/07/21 0823   lisinopril (ZESTRIL) tablet 40 mg, 40 mg, Oral, Daily, Karmen Bongo, MD, 40 mg at 05/07/21 7893   metoprolol succinate (TOPROL-XL) 24 hr tablet 50 mg, 50 mg, Oral, Daily, Karmen Bongo, MD, 50 mg at 05/07/21 8101   multivitamin with minerals tablet 1 tablet, 1 tablet, Oral, Daily, Elgergawy, Silver Huguenin, MD, 1 tablet at 05/07/21 0823   ondansetron (ZOFRAN) injection 4 mg, 4 mg, Intravenous, Q6H PRN, Karmen Bongo, MD, 4 mg at 05/05/21 1428   senna-docusate (Senokot-S) tablet 1 tablet, 1 tablet, Oral, QHS PRN, Karmen Bongo, MD   sodium chloride flush (NS) 0.9 % injection 3 mL, 3 mL, Intravenous, Q12H, Karmen Bongo, MD, 3 mL at 05/07/21 0824   sodium chloride flush (NS) 0.9 % injection 3 mL, 3 mL, Intravenous, PRN, Karmen Bongo, MD  Patients Current Diet:  Diet Order             Diet Carb Modified Fluid consistency: Thin; Room service appropriate? Yes with Assist  Diet effective now                   Precautions / Restrictions Precautions Precautions: Fall Precaution Comments: reports dizziness Restrictions Weight Bearing Restrictions: No   Has the patient had 2 or more falls or a fall with injury in the past year? Yes  Prior Activity Level Community (5-7x/wk): Went out  daily.  Prior Functional Level Self Care: Did the patient need help bathing, dressing, using the toilet or eating? Needed some help  Indoor Mobility: Did the patient need assistance with walking from room to room (with or without device)? Independent  Stairs: Did the patient need assistance with internal or external stairs (with or without device)? Needed some help  Functional Cognition: Did the patient need help planning regular tasks such as shopping or remembering to take medications? Needed some help  Patient Information Are you of Hispanic, Latino/a,or Spanish origin?: A. No, not of Hispanic, Latino/a, or Spanish origin What is your race?: B. Black or African American Do you need or want an interpreter to communicate with a doctor or health care staff?: 0. No  Patient's  Response To:  Health Literacy and Transportation Is the patient able to respond to health literacy and transportation needs?: Yes Health Literacy - How often do you need to have someone help you when you read instructions, pamphlets, or other written material from your doctor or pharmacy?: Rarely In the past 12 months, has lack of transportation kept you from medical appointments or from getting medications?: Yes In the past 12 months, has lack of transportation kept you from meetings, work, or from getting things needed for daily living?: Yes  [place] / [place] Devices/Equipment: Blood pressure cuff Home Equipment: Cane - single point, Conservation officer, nature (2 wheels)  Prior Device Use: Indicate devices/aids used by the patient prior to current illness, exacerbation or injury? Walker  Current Functional Level Cognition  Arousal/Alertness: Awake/alert Overall Cognitive Status: Impaired/Different from baseline Current Attention Level: Focused Orientation Level: Oriented to person, Oriented to place, Oriented to situation, Disoriented to time Following Commands: Follows multi-step  commands consistently Safety/Judgement: Decreased awareness of safety, Decreased awareness of deficits General Comments: verbal cues to keep eyes open, pt difficulty to understand due to dysarthric speech, pt spoke of 2 daughters and caregiver but never spoke of wife who showed up to his room at end of therapy eval, wife does report she lives at a different house, pt not motivated and appears to live sedentary life. Pt with noted inconsistencies in report of support and home set up Attention: Selective Selective Attention: Impaired Selective Attention Impairment: Verbal basic Memory: Impaired Memory Impairment: Storage deficit Awareness: Impaired Awareness Impairment: Intellectual impairment Safety/Judgment: Impaired    Extremity Assessment (includes Sensation/Coordination)  Upper Extremity Assessment: Defer to OT evaluation RUE Deficits / Details: 3+/5 grosly LUE Deficits / Details: 3/5 grossly LUE Coordination: decreased fine motor, decreased gross motor  Lower Extremity Assessment: Generalized weakness    ADLs  Overall ADL's : Needs assistance/impaired Eating/Feeding: Set up, Supervision/ safety, Sitting Grooming: Wash/dry hands, Wash/dry face, Min guard, Sitting Upper Body Bathing: Moderate assistance Lower Body Bathing: Maximal assistance Upper Body Dressing : Moderate assistance Lower Body Dressing: Maximal assistance Toilet Transfer: Moderate assistance, +2 for safety/equipment, Cueing for safety, Cueing for sequencing, Stand-pivot Toileting- Clothing Manipulation and Hygiene: Maximal assistance Functional mobility during ADLs: Moderate assistance, +2 for physical assistance, +2 for safety/equipment, Cueing for safety, Cueing for sequencing    Mobility  Overal bed mobility: Needs Assistance Bed Mobility: Supine to Sit Supine to sit: Mod assist, +2 for physical assistance General bed mobility comments: required encouragement to sit EOB, mod A to brig LEs to EOB/Off EOB and  to elevate trunk    Transfers  Overall transfer level: Needs assistance Equipment used: 2 person hand held assist Transfers: Sit to/from Stand, Bed to chair/wheelchair/BSC Sit to Stand: Mod assist, +2 physical assistance, +2 safety/equipment Bed to/from chair/wheelchair/BSC transfer type:: Stand pivot Stand pivot transfers: Mod assist, +2 physical assistance, +2 safety/equipment General transfer comment: verbal and physical cues for sequencng and direction, pt with increased push through R side compared to L, reports dizziness with mobility but better than it was    Ambulation / Gait / Stairs / Wheelchair Mobility  Ambulation/Gait General Gait Details: unable this date    Posture / Balance Balance Overall balance assessment: Needs assistance Sitting-balance support: No upper extremity supported, Feet supported Sitting balance-Leahy Scale: Fair Standing balance support: Bilateral upper extremity supported, During functional activity Standing balance-Leahy Scale: Poor    Special needs/care consideration Continuous Drip IV  KVO; Diabetes - yes has a h/o DM  Previous Home Environment (from acute therapy documentation) Living Arrangements: Spouse/significant other (pt's wife arrived at end of session (they do not live together))  Lives With: Other (Comment) Coralyn Mark, 24 hour caregiver) Available Help at Discharge: Available 24 hours/day Type of Home: House Home Layout: One level Home Access: Stairs to enter Entrance Stairs-Rails: None Entrance Stairs-Number of Steps: 1 Bathroom Shower/Tub: Chiropodist: [place]: No  Discharge Living Setting Plans for Discharge Living Setting: House, Lives with (comment) Coralyn Mark, friend/caregiver, lives with patient.) Type of Home at Discharge: House Discharge Home Layout: One level Discharge Home Access: Stairs to enter Entrance Stairs-Rails: Left Entrance Stairs-Number of Steps: 2 Discharge Bathroom  Shower/Tub: Tub/shower unit, Curtain Discharge Bathroom Toilet: Standard Discharge Bathroom Accessibility: Yes How Accessible: Accessible via walker Does the patient have any problems obtaining your medications?: No  Social/Family/Support Systems Patient Roles: Spouse, Other (Comment) (Has ex-wife and a care giver.) Contact Information: Laureen Ochs - daughter Anticipated Caregiver: Coralyn Mark Ability/Limitations of Caregiver: Coralyn Mark lives with patient and is his caregiver.  Coralyn Mark works Software engineerwhen he wants to" per patient Caregiver Availability: 24/7 Discharge Plan Discussed with Primary Caregiver: Yes Is Caregiver In Agreement with Plan?: Yes Does Caregiver/Family have Issues with Lodging/Transportation while Pt is in Rehab?: No  Goals Patient/Family Goal for Rehab: PT/OT supervision to min assist goals Expected length of stay: 10-12 days Pt/Family Agrees to Admission and willing to participate: Yes Program Orientation Provided & Reviewed with Pt/Caregiver Including Roles  & Responsibilities: Yes  Decrease burden of Care through IP rehab admission: N/A  Possible need for SNF placement upon discharge: Not planned  Patient Condition: I have reviewed medical records from [facility], spoken with CM, and patient and family member. I met with patient at the bedside for inpatient rehabilitation assessment.  Patient will benefit from ongoing PT, OT, and SLP, can actively participate in 3 hours of therapy a day 5 days of the week, and can make measurable gains during the admission.  Patient will also benefit from the coordinated team approach during an Inpatient Acute Rehabilitation admission.  The patient will receive intensive therapy as well as Rehabilitation physician, nursing, social worker, and care management interventions.  Due to bladder management, bowel management, safety, skin/wound care, disease management, medication administration, pain management, and patient education the patient  requires 24 hour a day rehabilitation nursing.  The patient is currently Min A +2 with mobility and Mod-Max A with basic ADLs.  Discharge setting and therapy post discharge at home with outpatient is anticipated.  Patient has agreed to participate in the Acute Inpatient Rehabilitation Program and will admit today.  Preadmission Screen Completed By:  Retta Diones, with updates by Gayland Curry 05/07/2021 2:16 PM ______________________________________________________________________   Discussed status with Dr. Letta Pate on 05/09/21  at 10:00 AM and received approval for admission today.  Admission Coordinator:  Retta Diones, RN, with updates from Gayland Curry time 10:00 AM Sudie Grumbling 05/09/21    Assessment/Plan: Diagnosis:RIght cerebellar infarct Does the need for close, 24 hr/day Medical supervision in concert with the patient's rehab needs make it unreasonable for this patient to be served in a less intensive setting? Yes Co-Morbidities requiring supervision/potential complications: DM, Uncontrolled HTN Due to bladder management, bowel management, safety, skin/wound care, disease management, medication administration, pain management, and patient education, does the patient require 24 hr/day rehab nursing? Yes Does the patient require coordinated care of a physician, rehab nurse, PT, OT, and SLP to address physical and functional deficits in  the context of the above medical diagnosis(es)? Yes Addressing deficits in the following areas: balance, endurance, locomotion, strength, transferring, bowel/bladder control, bathing, dressing, feeding, grooming, toileting, cognition, and psychosocial support Can the patient actively participate in an intensive therapy program of at least 3 hrs of therapy 5 days a week? Yes The potential for patient to make measurable gains while on inpatient rehab is good Anticipated functional outcomes upon discharge from inpatient rehab: supervision PT,  supervision OT, n/a SLP Estimated rehab length of stay to reach the above functional goals is: 10-12d Anticipated discharge destination: Home 10. Overall Rehab/Functional Prognosis: good   MD Signature: Charlett Blake M.D. [facility] Fellow Am Acad of Phys Med and Rehab Diplomate Am Board of Electrodiagnostic Med Fellow Am Board of Interventional Pain

## 2021-05-07 NOTE — Progress Notes (Signed)
Physical Therapy Treatment Patient Details Name: Manuel Chapman MRN: [id number] DOB: Oct 27, 1945 Today's Date: 05/07/2021   History of Present Illness 76 y.o. male with a PMHx of prior ICH, gout, DM and HTN who presented to ED with a several day history of lightheadedness, dizziness with N/V. MRI brain: large region of acute/early subacute infarction affecting the inferior cerebellum on the right consistent with right PICA territory infarction. Extensive chronic small-vessel ischemic changes affecting the pons and cerebral hemispheric white matter. 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    PT Comments    Patient continues to have dizziness and imbalance, although reports it is better today. Stands with wide BOS with posterior lean initially. Able to progress to pivotal steps to recliner on his left. Patient fully participated with all requested.     Recommendations for follow up therapy are one component of a multi-disciplinary discharge planning process, led by the attending physician.  Recommendations may be updated based on patient status, additional functional criteria and insurance authorization.  Follow Up Recommendations  Acute inpatient rehab (3hours/day)     Assistance Recommended at Discharge Frequent or constant Supervision/Assistance  Patient can return home with the following A little help with walking and/or transfers;A little help with bathing/dressing/bathroom;Assistance with cooking/housework;Direct supervision/assist for medications management;Direct supervision/assist for financial management;Assist for transportation;Help with stairs or ramp for entrance   Equipment Recommendations   (pt has RW)    Recommendations for Other Services       Precautions / Restrictions Precautions Precautions: Fall Precaution Comments: reports dizziness     Mobility  Bed Mobility Overal bed mobility: Needs Assistance Bed Mobility: Rolling;Sidelying to  Sit Rolling: Min assist Sidelying to sit: Min assist       General bed mobility comments: vc for step-by-step sequencing to roll and push to sit up, but less physical assist needed    Transfers Overall transfer level: Needs assistance Equipment used: 2 person hand held assist;Rolling walker (2 wheels) Transfers: Sit to/from Stand;Bed to chair/wheelchair/BSC Sit to Stand: +2 physical assistance;+2 safety/equipment;Min assist Stand pivot transfers: +2 physical assistance;+2 safety/equipment;Min assist         General transfer comment: verbal and physical cues for sequencng and direction, reports dizziness with mobility but better than it was    Ambulation/Gait               General Gait Details: pivotal steps only   Stairs             Wheelchair Mobility    Modified Rankin (Stroke Patients Only) Modified Rankin (Stroke Patients Only) Pre-Morbid Rankin Score: Slight disability Modified Rankin: Moderately severe disability     Balance Overall balance assessment: Needs assistance Sitting-balance support: No upper extremity supported;Feet supported Sitting balance-Leahy Scale: Fair     Standing balance support: Bilateral upper extremity supported;During functional activity Standing balance-Leahy Scale: Poor Standing balance comment: wide BOS with initial posterior lean; worked on Ship broker together an dpt with quick descent to sitting                            Cognition Arousal/Alertness: Awake/alert Behavior During Therapy: Flat affect Overall Cognitive Status: No family/caregiver present to determine baseline cognitive functioning Area of Impairment: Memory;Following commands;Safety/judgement;Awareness;Problem solving                     Memory: Decreased short-term memory Following Commands: Follows multi-step commands consistently Safety/Judgement: Decreased awareness of safety;Decreased awareness of  deficits Awareness:  Emergent Problem Solving: Slow processing;Decreased initiation;Difficulty sequencing;Requires verbal cues;Requires tactile cues General Comments: able to follow one step commands with slight delay;        Exercises      General Comments General comments (skin integrity, edema, etc.): stood from eOB x 2 reps      Pertinent Vitals/Pain Pain Assessment: No/denies pain    Home Living                          Prior Function            PT Goals (current goals can now be found in the care plan section) Acute Rehab PT Goals Patient Stated Goal: home Time For Goal Achievement: 05/20/21 Potential to Achieve Goals: Good Progress towards PT goals: Progressing toward goals    Frequency    Min 4X/week      PT Plan Current plan remains appropriate    Co-evaluation              AM-PAC PT "6 Clicks" Mobility   Outcome Measure  Help needed turning from your back to your side while in a flat bed without using bedrails?: A Little Help needed moving from lying on your back to sitting on the side of a flat bed without using bedrails?: A Little Help needed moving to and from a bed to a chair (including a wheelchair)?: Total Help needed standing up from a chair using your arms (e.g., wheelchair or bedside chair)?: Total Help needed to walk in hospital room?: Total Help needed climbing 3-5 steps with a railing? : Total 6 Click Score: 10    End of Session Equipment Utilized During Treatment: Gait belt Activity Tolerance: Patient tolerated treatment well Patient left: in chair;with call bell/phone within reach;with chair alarm set;with family/visitor present Nurse Communication: Mobility status PT Visit Diagnosis: Unsteadiness on feet (R26.81);Muscle weakness (generalized) (M62.81);Difficulty in walking, not elsewhere classified (R26.2)     Time: [id number] PT Time Calculation (min) (ACUTE ONLY): 16 min  Charges:  $Neuromuscular Re-education: 8-22 mins                       Arby Barrette, PT Acute Rehabilitation Services  Pager [phone] Office [phone]    Rexanne Mano 05/07/2021, 2:22 PM

## 2021-05-07 NOTE — Progress Notes (Signed)
HOSPITAL MEDICINE OVERNIGHT EVENT NOTE    Nursing reports that evening sugar is 81 and is inquiring as to whether we should still proceed with administering Levemir 10 units this evening.  Chart reviewed, and it turns out the patient has poorly controlled diabetes mellitus at baseline.  Based on these tenuous sugars diabetes may be somewhat brittle.  All the more reason to be aggressive with achieving glycemic control while hospitalized.    I have advised nursing to proceed with giving the 10 units.  We will check a 2 AM sugar to ensure patient is not experiencing any early morning hypoglycemia.   Marinda Elk  MD [facility]

## 2021-05-07 NOTE — Progress Notes (Signed)
PROGRESS NOTE    Deshane Cotroneo  ZOX:[id number] DOB: November 25, 1945 DOA: 05/05/2021 PCP: Burton Apley, MD    Chief Complaint  Patient presents with   Dizziness    Brief Narrative:   Kiree Dejarnette is a 76 y.o. male with medical history significant of HTN, DM, and ICH (11/2019) presenting with dizziness.   He noticed 3 days ago that he "know I had to come, I kept putting it off."  He has bene vomiting, HTN, sweaty head, dizziness.  Symptoms have "changed" over that time but he can't really describe how.  No N/W/T of arms/legs.  Denies dysarthria.  No dysphagia.  +headache.    CT abnormal, MRI with CVA.  Neurology will consult.  Admitted for further work-up, neurology recommended discontinue permissive hypertension.  Assessment & Plan:   Principal Problem:   Acute CVA (cerebrovascular accident) (HCC) Active Problems:   Diabetes (HCC)   HTN (hypertension)   Dyslipidemia  Acute CVA (cerebrovascular accident) (HCC)- (present on admission) -Patient with prior CVA and ICH presenting with several days of stroke-like symptoms -CT with cerebellar infarct; MRI confirms and there is evidence of mass effect on the 4th ventricle without obstruction -He is well out of the window for tPA -Magement per neurology, he is currently on dual antiplatelet therapy aspirin and Plavix, commendation for aspirin 81 mg and Plavix 75 mg dual antiplatelet therapy for 3 weeks and then Plavix alone.  -Continue Lipitor 80 mg daily -2D echo with EF 45 to 50%, and grade 2 diastolic dysfunction, akinesis of left ventricle basal mid inferior lateral wall. -PT/OT/SLP consulted. -Patient declined loop recorder, but okay with 30 days cardiac event monitor as an outpatient.   HTN -Out of the window for permissive HTN -SBP goal per neurology is 120-140 -Resume home meds - Toprol XL, lisinopril, chlorthalidone, added low-dose hydralazine given overall is uncontrolled.   -Will add prn IV hydralazine    HLD -LDL significantly elevated at 169, compliant with Lipitor at home, Lipitor was increased to 80 mg.   DM -prior indicated very poor control, >13, current A1C is 9.9. -home PO medications (actos, glucophage, Amaryl) -He stopped taking Levemir, will resume, continue with 10 units twice daily.    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      DVT prophylaxis: heparin  Code Status: Full Family Communication: none at bedside Disposition: CIR  Status is: Inpatient  Remains inpatient appropriate because: CVA work up       Consultants:  neurology   Subjective:  No significant events overnight, patient unable to provide any complaints.  Objective: Vitals:   05/07/21 0845 05/07/21 0900 05/07/21 0930 05/07/21 1230  BP: (!) 158/82 (!) 146/86 (!) 157/67 126/62  Pulse: 71 65 63 71  Resp: 13 12 14 17   Temp:    97.8 F (36.6 C)  TempSrc:    Oral  SpO2: 95% 95% 97% 95%  Weight:      Height:        Intake/Output Summary (Last 24 hours) at 05/07/2021 1337 Last data filed at 05/06/2021 2149 Gross per 24 hour  Intake 3 ml  Output 125 ml  Net -122 ml   [employer]  05/06/21 0300  Weight: 90.2 kg    Examination:   Awake Alert, pleasant, he is more appropriate today, speech has improved as well. Symmetrical Chest wall movement, Good air movement bilaterally, CTAB RRR,No Gallops,Rubs or new Murmurs, No Parasternal Heave +ve B.Sounds, Abd Soft, No tenderness, No rebound - guarding or rigidity. No Cyanosis, Clubbing or edema, No new Rash or bruise      Data Reviewed: I have personally reviewed following labs and imaging  studies  CBC: Recent Labs  Lab 05/05/21 0210 05/05/21 0226 05/07/21 0133  WBC 11.4*  --  7.9  NEUTROABS 8.6*  --   --   HGB 10.5* 12.6* 10.7*  HCT 31.5* 37.0* 33.5*  MCV 89.2  --  88.9  PLT 282  --  265    Basic Metabolic Panel: Recent Labs  Lab 05/05/21 0226 05/05/21 0538 05/07/21 0133  NA 136 134* 135  K 3.7 4.0 4.0  CL  --  101 99  CO2  --  22 25  GLUCOSE  --  300* 215*  BUN  --  12 21  CREATININE  --  0.79 1.20  CALCIUM  --  7.6* 7.9*  MG  --   --  1.8    GFR: Estimated Creatinine Clearance: 55.2 mL/min (by C-G formula based on SCr of 1.2 mg/dL).  Liver Function Tests: Recent Labs  Lab 05/05/21 0538  AST 28  ALT 10  ALKPHOS 98  BILITOT 1.4*  PROT 5.7*  ALBUMIN 2.4*    CBG: Recent Labs  Lab 05/06/21 2146 05/07/21 0207 05/07/21 0643 05/07/21 0813 05/07/21 1230  GLUCAP 101* 207* 131* 123* 200*     Recent Results (from the past 240 hour(s))  Urine Culture     Status: Abnormal   Collection Time: 05/05/21  2:02 AM   Specimen: Urine, Clean Catch  Result Value Ref Range Status   Specimen Description URINE, CLEAN CATCH  Final   Special Requests   Final    NONE Performed at [facility], [address] [place], [place] [address]    Culture MULTIPLE SPECIES PRESENT, SUGGEST RECOLLECTION (A)  Final   Report Status 05/06/2021 FINAL  Final  Resp Panel by RT-PCR (Flu A&B, Covid) Nasopharyngeal Swab     Status: None   Collection Time: 05/05/21  2:04 AM   Specimen: Nasopharyngeal Swab; Nasopharyngeal(NP) swabs in vial transport medium  Result Value Ref Range Status   SARS Coronavirus 2 by RT PCR NEGATIVE NEGATIVE Final    Comment: (NOTE) SARS-CoV-2 target nucleic acids are NOT DETECTED.  The SARS-CoV-2 RNA is generally detectable in upper respiratory specimens during the acute phase of infection. The lowest concentration of SARS-CoV-2 viral copies this assay can detect is 138 copies/mL. A negative result does not preclude  SARS-Cov-2 infection and should not be used as the sole basis for treatment or other patient management decisions. A negative result may occur with  improper specimen collection/handling, submission of specimen other than nasopharyngeal swab, presence of viral mutation(s) within the areas targeted by this assay, and inadequate number of viral copies(<138 copies/mL). A negative result must be combined with clinical observations, patient history, and epidemiological information. The expected result is Negative.  Fact Sheet for Patients:  BloggerCourse.com  Fact Sheet for Healthcare Providers:  SeriousBroker.it  This test is no t yet approved or cleared by the [place] FDA and  has been authorized for detection and/or diagnosis of SARS-CoV-2 by FDA under an Emergency Use Authorization (EUA). This EUA will  remain  in effect (meaning this test can be used) for the duration of the COVID-19 declaration under Section 564(b)(1) of the Act, 21 U.S.C.section 360bbb-3(b)(1), unless the authorization is terminated  or revoked sooner.       Influenza A by PCR NEGATIVE NEGATIVE Final   Influenza B by PCR NEGATIVE NEGATIVE Final    Comment: (NOTE) The Xpert Xpress SARS-CoV-2/FLU/RSV plus assay is intended as an aid in the diagnosis of influenza from Nasopharyngeal swab specimens and should not be used as a sole basis for treatment. Nasal washings and aspirates are unacceptable for Xpert Xpress SARS-CoV-2/FLU/RSV testing.  Fact Sheet for Patients: BloggerCourse.comhttps://www.fda.gov/media/152166/download  Fact Sheet for Healthcare Providers: SeriousBroker.ithttps://www.fda.gov/media/152162/download  This test is not yet approved or cleared by the [place] FDA and has been authorized for detection and/or diagnosis of SARS-CoV-2 by FDA under an Emergency Use Authorization (EUA). This EUA will remain in effect (meaning this test can be used) for the duration of  the COVID-19 declaration under Section 564(b)(1) of the Act, 21 U.S.C. section 360bbb-3(b)(1), unless the authorization is terminated or revoked.  Performed at [facility], [address] [place], [place] [address]          Radiology Studies: CT ANGIO HEAD NECK W WO CM  Result Date: 05/06/2021 CLINICAL DATA:  Dizziness, history of stroke EXAM: CT ANGIOGRAPHY HEAD AND NECK TECHNIQUE: Multidetector CT imaging of the head and neck was performed using the standard protocol during bolus administration of intravenous contrast. Multiplanar CT image reconstructions and MIPs were obtained to evaluate the vascular anatomy. Carotid stenosis measurements (when applicable) are obtained utilizing NASCET criteria, using the distal internal carotid diameter as the denominator. RADIATION DOSE REDUCTION: This exam was performed according to the departmental dose-optimization program which includes automated exposure control, adjustment of the mA and/or kV according to patient size and/or use of iterative reconstruction technique. CONTRAST:  70mL OMNIPAQUE IOHEXOL 350 MG/ML SOLN COMPARISON:  05/05/2021 CT head, 05/06/2021 MRA, 11/28/2019 CTA head neck FINDINGS: CT HEAD FINDINGS Brain: Redemonstrated low-density in the right cerebellar hemisphere, consistent with history of infarct, largely unchanged compared to the prior exam. Mild mass effect on the right aspect of the lateral ventricle, unchanged. No acute hemorrhage, mass, or midline shift. No hydrocephalus or extra-axial collection. Periventricular white matter changes, likely the sequela of chronic small vessel ischemic disease. Vascular: No hyperdense vessel. Skull: No acute osseous abnormality. Sinuses: Imaged portions are clear. Orbits: No acute finding. Review of the MIP images confirms the above findings CTA NECK FINDINGS Aortic arch: Two-vessel arch with a common origin of the brachiocephalic and left common carotid arteries. Imaged portion shows no  evidence of aneurysm or dissection. No significant stenosis of the major arch vessel origins. Aortic atherosclerosis. Right carotid system: No evidence of dissection, stenosis (50% or greater) or occlusion. Left carotid system: No evidence of dissection, stenosis (50% or greater) or occlusion. Vertebral arteries: No evidence of dissection, stenosis (50% or greater) or occlusion. Skeleton: No acute osseous abnormality. Other neck: Negative. Upper chest: Negative. Review of the MIP images confirms the above findings CTA HEAD FINDINGS Anterior circulation: Both internal carotid arteries are patent to the termini, with moderate calcified and noncalcified narrowing in the right carotid siphon and non hemodynamically significant calcifications in the left intracranial ICA. A1 segments patent. Normal anterior communicating artery. Anterior cerebral arteries are patent to their distal aspects. No M1 stenosis or occlusion. Normal MCA bifurcations. Distal MCA branches perfused and symmetric. Posterior circulation: Vertebral arteries patent to the vertebrobasilar  junction without stenosis. Posterior inferior cerebral arteries patent bilaterally. Basilar mildly irregular but patent to its distal aspect. Superior cerebellar arteries patent bilaterally. Diminutive left P1 with patent posterior communicating artery, with near fetal origin of the left PCA. No right posterior communicating artery is seen. Normal origin of the right PCA. PCAs perfused to their distal aspects without stenosis. Venous sinuses: As permitted by contrast timing, patent. Anatomic variants: None significant Review of the MIP images confirms the above findings IMPRESSION: 1. Moderate narrowing of the intracranial right internal carotid artery. No large vessel occlusion or other hemodynamically significant stenosis. 2.  No hemodynamically significant stenosis in the neck. 3. Redemonstrated low-density in the right cerebellum, consistent with infarct. No  additional acute intracranial process. Electronically Signed   By: Wiliam Ke M.D.   On: 05/06/2021 15:57   CT HEAD WO CONTRAST ( )  Result Date: 05/05/2021 CLINICAL DATA:  Follow-up stroke EXAM: CT HEAD WITHOUT CONTRAST TECHNIQUE: Contiguous axial images were obtained from the base of the skull through the vertex without intravenous contrast. RADIATION DOSE REDUCTION: This exam was performed according to the departmental dose-optimization program which includes automated exposure control, adjustment of the mA and/or kV according to patient size and/or use of iterative reconstruction technique. COMPARISON:  CT and MRI 05/05/2021 FINDINGS: Brain: Large low-density in the right cerebellum consistent with history of infarct. No hemorrhage identified. Similar degree of mass effect on fourth ventricle without evidence for obstruction. Atrophy and advanced chronic small vessel ischemic changes of the white matter. Stable ventricular size. Vascular: No hyperdense vessels.  Carotid vascular calcification Skull: Normal. Negative for fracture or focal lesion. Sinuses/Orbits: No acute finding. Other: None IMPRESSION: 1. Similar size and appearance of large low-density region in the right cerebellum consistent with history of infarct. No hemorrhage identified. Similar degree of mass effect on fourth ventricle without evidence for obstruction. 2. Atrophy and advanced chronic small vessel ischemic changes of the white matter. Electronically Signed   By: Jasmine Pang M.D.   On: 05/05/2021 23:32   MR ANGIO HEAD WO CONTRAST  Result Date: 05/06/2021 CLINICAL DATA:  Brain MRI and CT from yesterday EXAM: MRA HEAD WITHOUT CONTRAST TECHNIQUE: Angiographic images of the Circle of Willis were acquired using MRA technique without intravenous contrast. COMPARISON:  No pertinent prior exam. FINDINGS: Anterior circulation: Atheromatous irregularity of the carotid siphons, especially on the right where there is 30% narrowing.  Atheromatous irregularity of intracranial branches bilaterally. Downward pointing outpouching at the right supraclinoid carotid measuring up to 2 mm in size. Posterior circulation: The vertebral arteries are not covered. The covered basilar artery is widely patent. Minimal coverage of the right PICA vessel which is flowing where seen. Fetal type left PCA flow. No branch occlusion or visible aneurysm Anatomic variants: As above Other: None. IMPRESSION: 1. Non covered vertebral arteries and PICA branches. The covered posterior circulation vessels are widely patent. 2. Siphon atherosclerosis especially affecting the right cavernous carotid with mild stenosis. 3. 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 Electronically Signed   By: Tiburcio Pea M.D.   On: 05/06/2021 05:38   ECHOCARDIOGRAM COMPLETE  Result Date: 05/05/2021    ECHOCARDIOGRAM REPORT   Patient Name:   PLEASANT BENSINGER Date of Exam: 05/05/2021 Medical Rec #:  [id number]             Height:       66.0 in Accession #:    [id number]            Weight:  226.2 lb Date of Birth:  05/17/1945             BSA:          2.107 m Patient Age:    12 years              BP:           177/93 mmHg Patient Gender: M                     HR:           76 bpm. Exam Location:  Inpatient Procedure: 2D Echo, Cardiac Doppler, Color Doppler and Intracardiac            Opacification Agent Indications:    Stroke  History:        Patient has prior history of Echocardiogram examinations, most                 recent 11/29/2019. Aortic Valve Disease; Risk Factors:Diabetes,                 Hypertension and Dyslipidemia.  Sonographer:    Vanetta Shawl Referring Phys: [id number] JENNIFER YATES IMPRESSIONS  1. Left ventricular ejection fraction, by estimation, is 45 to 50%. The left ventricle has mildly decreased function. The left ventricle demonstrates regional wall motion abnormalities (see scoring diagram/findings for description). There is moderate concentric  left ventricular hypertrophy. Left ventricular diastolic parameters are consistent with Grade II diastolic dysfunction (pseudonormalization). Elevated left atrial pressure. There is akinesis of the left ventricular, basal-mid inferolateral wall.  2. Right ventricular systolic function is mildly reduced. The right ventricular size is mildly enlarged. Tricuspid regurgitation signal is inadequate for assessing PA pressure.  3. Left atrial size was mildly dilated.  4. Right atrial size was mildly dilated.  5. The mitral valve is normal in structure. Mild mitral valve regurgitation.  6. The aortic valve is tricuspid. There is severe calcifcation of the aortic valve. There is severe thickening of the aortic valve. Aortic valve regurgitation is trivial. Severe aortic valve stenosis. Aortic valve mean gradient measures 30.9 mmHg. Aortic valve Vmax measures 4.04 m/s. Comparison(s): Prior images reviewed side by side. The left ventricular function is worsened. The right ventricular systolic function is worse. Aortic stenosis has worsened and is now severe. FINDINGS  Left Ventricle: Left ventricular ejection fraction, by estimation, is 45 to 50%. The left ventricle has mildly decreased function. The left ventricle demonstrates regional wall motion abnormalities. The left ventricular internal cavity size was normal in size. There is moderate concentric left ventricular hypertrophy. Left ventricular diastolic parameters are consistent with Grade II diastolic dysfunction (pseudonormalization). Elevated left atrial pressure.  LV Wall Scoring: The posterior wall is akinetic. Right Ventricle: The right ventricular size is mildly enlarged. Right vetricular wall thickness was not well visualized. Right ventricular systolic function is mildly reduced. Tricuspid regurgitation signal is inadequate for assessing PA pressure. Left Atrium: Left atrial size was mildly dilated. Right Atrium: Right atrial size was mildly dilated. Pericardium:  There is no evidence of pericardial effusion. Mitral Valve: The mitral valve is normal in structure. Mild mitral valve regurgitation, with centrally-directed jet. Tricuspid Valve: The tricuspid valve is normal in structure. Tricuspid valve regurgitation is not demonstrated. Aortic Valve: The aortic valve is tricuspid. There is severe calcifcation of the aortic valve. There is severe thickening of the aortic valve. Aortic valve regurgitation is trivial. Severe aortic stenosis is present. Aortic valve mean gradient measures 30.9 mmHg. Aortic valve peak gradient  measures 65.2 mmHg. Aortic valve area, by VTI measures 0.58 cm. Pulmonic Valve: The pulmonic valve was grossly normal. Pulmonic valve regurgitation is not visualized. Aorta: The aortic root and ascending aorta are structurally normal, with no evidence of dilitation. IAS/Shunts: The interatrial septum was not well visualized.  LEFT VENTRICLE PLAX 2D LVIDd:         5.00 cm   Diastology LVIDs:         3.80 cm   LV e' medial:    4.00 cm/s LV PW:         1.10 cm   LV E/e' medial:  28.3 LV IVS:        1.60 cm   LV e' lateral:   4.87 cm/s LVOT diam:     1.90 cm   LV E/e' lateral: 23.2 LV SV:         58 LV SV Index:   27 LVOT Area:     2.84 cm  RIGHT VENTRICLE            IVC RV S prime:     7.31 cm/s  IVC diam: 2.40 cm LEFT ATRIUM         Index LA diam:    4.20 cm 1.99 cm/m  AORTIC VALVE AV Area (Vmax):    0.57 cm AV Area (Vmean):   0.64 cm AV Area (VTI):     0.58 cm AV Vmax:           403.81 cm/s AV Vmean:          244.780 cm/s AV VTI:            0.991 m AV Peak Grad:      65.2 mmHg AV Mean Grad:      30.9 mmHg LVOT Vmax:         80.93 cm/s LVOT Vmean:        55.267 cm/s LVOT VTI:          0.204 m LVOT/AV VTI ratio: 0.21  AORTA Ao Root diam: 3.10 cm Ao Asc diam:  2.85 cm MITRAL VALVE MV Area (PHT): 4.36 cm     SHUNTS MV Decel Time: 174 msec     Systemic VTI:  0.20 m MV E velocity: 113.00 cm/s  Systemic Diam: 1.90 cm MV A velocity: 125.00 cm/s MV E/A ratio:   0.90 Mihai Croitoru MD Electronically signed by Thurmon FairMihai Croitoru MD Signature Date/Time: 05/05/2021/5:18:53 PM    Final    VAS US CAROTID (at [facility] only)  Result Date: 05/06/2021 Carotid Arterial Duplex Study Patient Name:  Ronalee BeltsROBERT EUGENE Chronis  Date of Exam:   05/05/2021 Medical Rec #: [id number]              Accession #:    [id number] Date of Birth: 09-Jan-1946              Patient Gender: M Patient Age:   [age] years [facility] Procedure:      VAS US CAROTID Referring Phys: Jonah BlueJENNIFER YATES --------------------------------------------------------------------------------  Indications:  CVA. Risk Factors: Hypertension, hyperlipidemia, Diabetes, past history of smoking. Performing Technologist: Jean Rosenthalachel Hodge RDMS, RVT  Examination Guidelines: A complete evaluation includes B-mode imaging, spectral Doppler, color Doppler, and power Doppler as needed of all accessible portions of each vessel. Bilateral testing is considered an integral part of a complete examination. Limited examinations for reoccurring indications may be performed as noted.  Right Carotid Findings: +----------+--------+--------+--------+---------------------+------------------+  PSV cm/s EDV cm/s Stenosis Plaque Description    Comments            +----------+--------+--------+--------+---------------------+------------------+  CCA Prox   60       9                                                           +----------+--------+--------+--------+---------------------+------------------+  CCA Distal 50       7                                       intimal thickening  +----------+--------+--------+--------+---------------------+------------------+  ICA Prox   26       8        1-39%    hyperechoic and focal                     +----------+--------+--------+--------+---------------------+------------------+  ICA Distal 40       12                                                           +----------+--------+--------+--------+---------------------+------------------+  ECA        41                                                                   +----------+--------+--------+--------+---------------------+------------------+ +----------+--------+-------+----------------+-------------------+             PSV cm/s EDV cms Describe         Arm Pressure (mmHG)  +----------+--------+-------+----------------+-------------------+  Subclavian 64               Multiphasic, WNL                      +----------+--------+-------+----------------+-------------------+ +---------+--------+--+--------+-+---------+  Vertebral PSV cm/s 27 EDV cm/s 6 Antegrade  +---------+--------+--+--------+-+---------+  Left Carotid Findings: +----------+--------+--------+--------+------------------+--------+             PSV cm/s EDV cm/s Stenosis Plaque Description Comments  +----------+--------+--------+--------+------------------+--------+  CCA Prox   50       7                                              +----------+--------+--------+--------+------------------+--------+  CCA Distal 54       11                                             +----------+--------+--------+--------+------------------+--------+  ICA Prox   46       12                heterogenous mild            +----------+--------+--------+--------+------------------+--------+  ICA Distal 66       16                                             +----------+--------+--------+--------+------------------+--------+  ECA        51                                                      +----------+--------+--------+--------+------------------+--------+ +----------+--------+--------+----------------+-------------------+             PSV cm/s EDV cm/s Describe         Arm Pressure (mmHG)  +----------+--------+--------+----------------+-------------------+  Subclavian 83                Multiphasic, WNL                       +----------+--------+--------+----------------+-------------------+ +---------+--------+--+--------+-+---------+  Vertebral PSV cm/s 33 EDV cm/s 8 Antegrade  +---------+--------+--+--------+-+---------+   Summary: Right Carotid: Velocities in the right ICA are consistent with a 1-39% stenosis. Left Carotid: The extracranial vessels were near-normal with only minimal wall               thickening or plaque. Vertebrals:  Bilateral vertebral arteries demonstrate antegrade flow. Subclavians: Normal flow hemodynamics were seen in bilateral subclavian              arteries. *See table(s) above for measurements and observations.  Electronically signed by Delia Heady MD on 05/06/2021 at 8:17:42 AM.    Final         Scheduled Meds:   stroke: mapping our early stages of recovery book   Does not apply Once   allopurinol  300 mg Oral Daily   aspirin  81 mg Oral Daily   atorvastatin  80 mg Oral Daily   chlorthalidone  25 mg Oral Daily   clopidogrel  75 mg Oral Daily   feeding supplement  237 mL Oral BID BM   heparin injection (subcutaneous)  5,000 Units Subcutaneous Q8H   hydrALAZINE  25 mg Oral Q8H   insulin aspart  0-15 Units Subcutaneous TID WC   insulin detemir  10 Units Subcutaneous BID   lisinopril  40 mg Oral Daily   metoprolol succinate  50 mg Oral Daily   multivitamin with minerals  1 tablet Oral Daily   sodium chloride flush  3 mL Intravenous Q12H   Continuous Infusions:  sodium chloride 50 mL/hr at 05/07/21 0225     LOS: 2 days      Huey Bienenstock, MD [facility]   To contact the attending provider between 7A-7P or the covering provider during after hours 7P-7A, please log into the web site [email or web address] and access using universal [place] password for that web site. If you do not have the password, please call the hospital operator.  05/07/2021, 1:37 PM   Patient ID: Jaydian Santana, male   DOB: [birth year], 76 y.o.   MRN: [id number] Patient ID: Lamond Glantz, male   DOB: 1946-04-09, 76 y.o.   MRN: [id number]

## 2021-05-07 NOTE — Progress Notes (Signed)
fIP rehab admissions - I met with patient at the bedside.  He would like inpatient rehab.  His friend, Coralyn Mark, lives with him and provides assistance.  I will contact insurance carrier and request inpatient rehab admission.  Will follow up once we hear back from insurance carrier.  Call for questions.  [phone]

## 2021-05-08 LAB — URINALYSIS, MICROSCOPIC (REFLEX)

## 2021-05-08 LAB — GLUCOSE, CAPILLARY
Glucose-Capillary: 225 mg/dL — ABNORMAL HIGH (ref 70–99)
Glucose-Capillary: 197 mg/dL — ABNORMAL HIGH (ref 70–99)
Glucose-Capillary: 121 mg/dL — ABNORMAL HIGH (ref 70–99)
Glucose-Capillary: 51 mg/dL — ABNORMAL LOW (ref 70–99)
Glucose-Capillary: 117 mg/dL — ABNORMAL HIGH (ref 70–99)
Glucose-Capillary: 228 mg/dL — ABNORMAL HIGH (ref 70–99)

## 2021-05-08 LAB — URINALYSIS, ROUTINE W REFLEX MICROSCOPIC
Bilirubin Urine: NEGATIVE
Glucose, UA: NEGATIVE mg/dL
Ketones, ur: NEGATIVE mg/dL
Nitrite: NEGATIVE
Protein, ur: 100 mg/dL — AB
Specific Gravity, Urine: 1.015 (ref 1.005–1.030)
pH: 7 (ref 5.0–8.0)

## 2021-05-08 MED ORDER — INSULIN DETEMIR 100 UNIT/ML ~~LOC~~ SOLN
7.0000 [IU] | Freq: Two times a day (BID) | SUBCUTANEOUS | Status: DC
  Administered 2021-05-08: 7 [IU] via SUBCUTANEOUS
  Filled 2021-05-08 (×2): qty 0.07

## 2021-05-08 MED ORDER — INSULIN ASPART 100 UNIT/ML IJ SOLN
3.0000 [IU] | Freq: Three times a day (TID) | INTRAMUSCULAR | Status: DC
  Administered 2021-05-08 – 2021-05-09 (×3): 3 [IU] via SUBCUTANEOUS

## 2021-05-08 MED ORDER — GLUCERNA SHAKE PO LIQD
237.0000 mL | Freq: Three times a day (TID) | ORAL | Status: DC
  Administered 2021-05-08 – 2021-05-09 (×3): 237 mL via ORAL

## 2021-05-08 MED ORDER — HYDRALAZINE HCL 50 MG PO TABS
50.0000 mg | ORAL_TABLET | Freq: Three times a day (TID) | ORAL | Status: DC
  Administered 2021-05-08 – 2021-05-09 (×4): 50 mg via ORAL
  Filled 2021-05-08 (×4): qty 1

## 2021-05-08 NOTE — Progress Notes (Signed)
Physical Therapy Treatment Patient Details Name: Manuel Chapman MRN: [id number] DOB: Sep 20, 1945 Today's Date: 05/08/2021   History of Present Illness 76 y.o. male with a PMHx of prior ICH, gout, DM and HTN who presented to ED with a several day history of lightheadedness, dizziness with N/V. MRI brain: large region of acute/early subacute infarction affecting the inferior cerebellum on the right consistent with right PICA territory infarction. Extensive chronic small-vessel ischemic changes affecting the pons and cerebral hemispheric white matter. 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    PT Comments    Pt was lethargic all day per nsg, awake enough to do some bed mob for PT.  He agrees to some exercise but by the time he was finished with getting cleaned up in bed, was too fatigued to continue.  Asked PT to let him rest and so will progress him to OOB next session as tolerated.  Pt is focused on his R side, with gaze to R and tending to lie on R side.  Encourage him to be upright and sitting with midline focus, and positioned bed to have more a midline posture.  Nursing aware of his somnolence and can continue to get him to interact with them to elicit more active movement of both limb sides.  Recommendations for follow up therapy are one component of a multi-disciplinary discharge planning process, led by the attending physician.  Recommendations may be updated based on patient status, additional functional criteria and insurance authorization.  Follow Up Recommendations  Acute inpatient rehab (3hours/day)     Assistance Recommended at Discharge Frequent or constant Supervision/Assistance  Patient can return home with the following A little help with walking and/or transfers;A little help with bathing/dressing/bathroom;Assist for transportation;Help with stairs or ramp for entrance;Assistance with cooking/housework   Equipment Recommendations  None recommended by  PT    Recommendations for Other Services Rehab consult     Precautions / Restrictions Precautions Precautions: Fall Restrictions Weight Bearing Restrictions: No     Mobility  Bed Mobility Overal bed mobility: Needs Assistance Bed Mobility: Rolling (scooting up the bed) Rolling: Min assist         General bed mobility comments: mod assist to scoot up the bed with cues which pt can follow with extra time    Transfers                   General transfer comment: declines    Ambulation/Gait                   Stairs             Wheelchair Mobility    Modified Rankin (Stroke Patients Only)       Balance                                            Cognition Arousal/Alertness: Awake/alert Behavior During Therapy: Flat affect Overall Cognitive Status: No family/caregiver present to determine baseline cognitive functioning Area of Impairment: Orientation;Attention;Memory;Following commands                 Orientation Level: Time;Situation Current Attention Level: Alternating Memory: Decreased short-term memory Following Commands: Follows one step commands consistently     Problem Solving: Slow processing          Exercises      General Comments General comments (skin integrity,  edema, etc.): Pt has been in bed awaiting CNA with moisture under him, has his cath off and so PT contacted nursing to help with changing bed and pt      Pertinent Vitals/Pain Pain Assessment: No/denies pain    Home Living                          Prior Function            PT Goals (current goals can now be found in the care plan section) Acute Rehab PT Goals Patient Stated Goal: none stated    Frequency    Min 4X/week      PT Plan Current plan remains appropriate    Co-evaluation              AM-PAC PT "6 Clicks" Mobility   Outcome Measure  Help needed turning from your back to your side while  in a flat bed without using bedrails?: A Little Help needed moving from lying on your back to sitting on the side of a flat bed without using bedrails?: A Little Help needed moving to and from a bed to a chair (including a wheelchair)?: Total Help needed standing up from a chair using your arms (e.g., wheelchair or bedside chair)?: Total Help needed to walk in hospital room?: Total Help needed climbing 3-5 steps with a railing? : Total 6 Click Score: 10    End of Session   Activity Tolerance: Patient tolerated treatment well;Patient limited by fatigue Patient left: in bed;with call bell/phone within reach;with bed alarm set Nurse Communication: Mobility status PT Visit Diagnosis: Muscle weakness (generalized) (M62.81);Difficulty in walking, not elsewhere classified (R26.2)     Time: 9326-7124 PT Time Calculation (min) (ACUTE ONLY): 25 min  Charges:  $Therapeutic Activity: 23-37 mins          Ivar Drape 05/08/2021, 4:26 PM  Samul Dada, PT PhD Acute Rehab Dept. Number: [facility] [id number] and [facility] [phone]

## 2021-05-08 NOTE — TOC Progression Note (Signed)
Transition of Care [facility]) - Progression Note    Patient Details  Name: Manuel Chapman MRN: [id number] Date of Birth: [birth year]  [facility]) CM/SW Contact  Mearl Latin, LCSW Phone Number: 05/08/2021, 5:36 PM  Clinical Narrative:    CSW received request to speak with patient's daughter, Rene Kocher. CSW answered questions about what would happen if insurance does not approve rehab at [facility]. She reported that if patient requires SNF, he does not want to go back to [facility] as he has been there before. CSW also provided information on [employer] if she ever feels patient requires an Research officer, trade union. She does not believe patient qualifies for Medicaid as he makes over the limit. She confirmed that caregiver [name] lives with the patient. No other questions at this time.    Expected Discharge Plan: IP Rehab Facility Barriers to Discharge: Insurance Authorization  Expected Discharge Plan and Services Expected Discharge Plan: IP Rehab Facility     Post Acute Care Choice: Durable Medical Equipment (has walker, can, scooter) Living arrangements for the past 2 months: Single Family Home                                       Social Determinants of Health (SDOH) Interventions    Readmission Risk Interventions No flowsheet data found.

## 2021-05-08 NOTE — Progress Notes (Signed)
PROGRESS NOTE    Manuel Chapman  [id number] DOB: 10-22-1945 DOA: 05/05/2021 PCP: Lorene Dy, MD    Chief Complaint  Patient presents with   Dizziness    Brief Narrative:   Manuel Chapman is a 76 y.o. male with medical history significant of HTN, DM, and ICH (11/2019) presenting with dizziness.   He noticed 3 days ago that he "know I had to come, I kept putting it off."  He has bene vomiting, HTN, sweaty head, dizziness.  Symptoms have "changed" over that time but he can't really describe how.  No N/W/T of arms/legs.  Denies dysarthria.  No dysphagia.  +headache.    CT abnormal, MRI with CVA.  Neurology will consult.  Admitted for further work-up, neurology recommended discontinue permissive hypertension.  Assessment & Plan:   Principal Problem:   Acute CVA (cerebrovascular accident) ([place]) Active Problems:   Diabetes ([place])   HTN (hypertension)   Dyslipidemia  Acute CVA (cerebrovascular accident) ([place])- (present on admission) -Patient with prior CVA and ICH presenting with several days of stroke-like symptoms -CT with cerebellar infarct; MRI confirms and there is evidence of mass effect on the 4th ventricle without obstruction -He is well out of the window for tPA -Magement per neurology, he is currently on dual antiplatelet therapy aspirin and Plavix, commendation for aspirin 81 mg and Plavix 75 mg dual antiplatelet therapy for 3 weeks and then Plavix alone.  -Continue Lipitor 80 mg daily -2D echo with EF 45 to 50%, and grade 2 diastolic dysfunction, akinesis of left ventricle basal mid inferior lateral wall. -PT/OT/SLP consulted. -Patient declined loop recorder, but okay with 30 days cardiac event monitor as an outpatient.   HTN -Out of the window for permissive HTN -SBP goal per neurology is 120-140 -Resume home meds - Toprol XL, lisinopril, chlorthalidone, added low-dose hydralazine given overall is uncontrolled.   -Will add prn IV hydralazine -Blood  pressure remains elevated, will increase his hydralazine to 50 mg 3 times daily   HLD -LDL significantly elevated at 169, compliant with Lipitor at home, Lipitor was increased to 80 mg.   DM -prior indicated very poor control, >13, current A1C is 9.9. -home PO medications (actos, glucophage, Amaryl) -He stopped taking Levemir, will resume, continue with 10 units twice daily.  Had low readings in the evening, will decrease Levemir to 7 units twice daily and add 3 of NovoLog before meals.  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      DVT prophylaxis: heparin  Code Status: Full Family Communication: none at bedside Disposition: CIR  Status is: Inpatient  Remains inpatient appropriate because: It is medically stable to discharge to CIR.  Awaiting bed availability and insurance approval       Consultants:  neurology   Subjective:  No significant events overnight, patient denies any complaints.  Objective: Vitals:   05/08/21 0007 05/08/21 0336 05/08/21 0638 05/08/21 0743  BP: (!) 141/63 (!) 188/84 (!) 176/82 (!) 175/72  Pulse: 68 72  72  Resp: 17 17  17   Temp: 98.5 F (36.9 C) (!) 97.2 F (36.2 C)  97.7 F (36.5 C)  TempSrc: Oral Oral  Oral  SpO2: 92% 98%  94%  Weight:      Height:        Intake/Output Summary (Last 24 hours) at 05/08/2021 1110 Last data filed at 05/08/2021 0336 Gross per 24 hour  Intake 1737.89 ml  Output 750 ml  Net 987.89 ml   Filed Weights   05/06/21 0300  Weight: 90.2 kg    Examination:  He is more sleepy this morning, but open eyes to verbal stimuli  symmetrical Chest wall movement, Good air movement  bilaterally, CTAB RRR,No Gallops,Rubs or new Murmurs, No Parasternal Heave +ve B.Sounds, Abd Soft, No tenderness, No rebound - guarding or rigidity. No Cyanosis, Clubbing or edema, No new Rash or bruise      Data Reviewed: I have personally reviewed following labs and imaging studies  CBC: Recent Labs  Lab 05/05/21 0210 05/05/21 0226 05/07/21 0133  WBC 11.4*  --  7.9  NEUTROABS 8.6*  --   --   HGB 10.5* 12.6* 10.7*  HCT 31.5* 37.0* 33.5*  MCV 89.2  --  88.9  PLT 282  --  265    Basic Metabolic Panel: Recent Labs  Lab 05/05/21 0226 05/05/21 0538 05/07/21 0133  NA 136 134* 135  K 3.7 4.0 4.0  CL  --  101 99  CO2  --  22 25  GLUCOSE  --  300* 215*  BUN  --  12 21  CREATININE  --  0.79 1.20  CALCIUM  --  7.6* 7.9*  MG  --   --  1.8    GFR: Estimated Creatinine Clearance: 55.2 mL/min (by C-G formula based on SCr of 1.2 mg/dL).  Liver Function Tests: Recent Labs  Lab 05/05/21 0538  AST 28  ALT 10  ALKPHOS 98  BILITOT 1.4*  PROT 5.7*  ALBUMIN 2.4*    CBG: Recent Labs  Lab 05/07/21 1230 05/07/21 1649 05/07/21 2039 05/08/21 0221 05/08/21 0743  GLUCAP 200* 164* 81 225* 197*     Recent Results (from the past 240 hour(s))  Urine Culture     Status: Abnormal   Collection Time: 05/05/21  2:02 AM   Specimen: Urine, Clean Catch  Result Value Ref Range Status   Specimen Description URINE, CLEAN CATCH  Final   Special Requests   Final    NONE Performed at [facility], [address] [place], [place] [address]    Culture MULTIPLE SPECIES PRESENT, SUGGEST RECOLLECTION (A)  Final   Report Status 05/06/2021 FINAL  Final  Resp Panel by RT-PCR (Flu A&B, Covid) Nasopharyngeal Swab     Status: None   Collection Time: 05/05/21  2:04 AM   Specimen: Nasopharyngeal Swab; Nasopharyngeal(NP) swabs in vial transport medium  Result Value Ref Range Status   SARS Coronavirus 2 by RT PCR NEGATIVE NEGATIVE Final    Comment: (NOTE) SARS-CoV-2 target nucleic acids  are NOT DETECTED.  The SARS-CoV-2 RNA is generally detectable in upper respiratory specimens during the acute phase of infection. The lowest concentration of SARS-CoV-2 viral copies this assay can detect is 138 copies/mL. A negative result does not preclude SARS-Cov-2 infection and should not be used as the sole basis for treatment or other patient management decisions. A negative result may occur with  improper specimen collection/handling, submission of specimen other than nasopharyngeal swab, presence of viral mutation(s) within the areas targeted by this assay, and inadequate number of viral copies(<138 copies/mL). A negative result must be combined with clinical observations, patient history, and epidemiological information. The expected result is Negative.  Fact Sheet for Patients:  EntrepreneurPulse.com.au  Fact Sheet for Healthcare Providers:  IncredibleEmployment.be  This test is no t yet approved or cleared by the [place] FDA and  has been authorized for detection and/or diagnosis of SARS-CoV-2 by FDA under an Emergency Use Authorization (EUA). This EUA will remain  in effect (meaning this test can be used) for the duration of the COVID-19 declaration under Section 564(b)(1) of the Act, 21 U.S.C.section 360bbb-3(b)(1), unless the authorization is terminated  or revoked sooner.       Influenza A by PCR NEGATIVE NEGATIVE Final   Influenza B by PCR NEGATIVE NEGATIVE Final    Comment: (NOTE) The Xpert Xpress SARS-CoV-2/FLU/RSV plus assay is intended as an aid in the diagnosis of influenza from Nasopharyngeal swab specimens and should not be used as a sole basis for treatment. Nasal washings and aspirates are unacceptable for Xpert Xpress SARS-CoV-2/FLU/RSV testing.  Fact Sheet for Patients: EntrepreneurPulse.com.au  Fact Sheet for Healthcare Providers: IncredibleEmployment.be  This test is  not yet approved or cleared by the [place] FDA and has been authorized for detection and/or diagnosis of SARS-CoV-2 by FDA under an Emergency Use Authorization (EUA). This EUA will remain in effect (meaning this test can be used) for the duration of the COVID-19 declaration under Section 564(b)(1) of the Act, 21 U.S.C. section 360bbb-3(b)(1), unless the authorization is terminated or revoked.  Performed at [facility], [place] [address], [place], [place] [address]          Radiology Studies: CT ANGIO HEAD NECK W WO CM  Result Date: 05/06/2021 CLINICAL DATA:  Dizziness, history of stroke EXAM: CT ANGIOGRAPHY HEAD AND NECK TECHNIQUE: Multidetector CT imaging of the head and neck was performed using the standard protocol during bolus administration of intravenous contrast. Multiplanar CT image reconstructions and MIPs were obtained to evaluate the vascular anatomy. Carotid stenosis measurements (when applicable) are obtained utilizing NASCET criteria, using the distal internal carotid diameter as the denominator. RADIATION DOSE REDUCTION: This exam was performed according to the departmental dose-optimization program which includes automated exposure control, adjustment of the mA and/or kV according to patient size and/or use of iterative reconstruction technique. CONTRAST:  64mL OMNIPAQUE IOHEXOL 350 MG/ML SOLN COMPARISON:  05/05/2021 CT head, 05/06/2021 MRA, 11/28/2019 CTA head neck FINDINGS: CT HEAD FINDINGS Brain: Redemonstrated low-density in the right cerebellar hemisphere, consistent with history of infarct, largely unchanged compared to the prior exam. Mild mass effect on the right aspect of the lateral ventricle, unchanged. No acute hemorrhage, mass, or midline shift. No hydrocephalus or extra-axial collection. Periventricular white matter changes, likely the sequela of chronic small vessel ischemic disease. Vascular: No hyperdense vessel. Skull: No acute osseous abnormality.  Sinuses: Imaged portions are clear. Orbits: No acute finding. Review of the MIP images confirms the above findings CTA NECK FINDINGS Aortic arch: Two-vessel arch with a common origin of the brachiocephalic and left common carotid arteries. Imaged portion shows no evidence of aneurysm or dissection. No significant stenosis of the major arch vessel origins. Aortic atherosclerosis. Right carotid system: No evidence of dissection, stenosis (50% or greater) or occlusion. Left carotid system: No evidence of dissection, stenosis (50% or greater) or occlusion. Vertebral arteries: No evidence of dissection, stenosis (50% or greater) or occlusion. Skeleton: No acute osseous abnormality. Other neck: Negative. Upper chest: Negative. Review of the MIP images confirms the above findings CTA HEAD FINDINGS Anterior circulation: Both internal carotid arteries are patent to the termini, with moderate calcified and noncalcified narrowing in the  right carotid siphon and non hemodynamically significant calcifications in the left intracranial ICA. A1 segments patent. Normal anterior communicating artery. Anterior cerebral arteries are patent to their distal aspects. No M1 stenosis or occlusion. Normal MCA bifurcations. Distal MCA branches perfused and symmetric. Posterior circulation: Vertebral arteries patent to the vertebrobasilar junction without stenosis. Posterior inferior cerebral arteries patent bilaterally. Basilar mildly irregular but patent to its distal aspect. Superior cerebellar arteries patent bilaterally. Diminutive left P1 with patent posterior communicating artery, with near fetal origin of the left PCA. No right posterior communicating artery is seen. Normal origin of the right PCA. PCAs perfused to their distal aspects without stenosis. Venous sinuses: As permitted by contrast timing, patent. Anatomic variants: None significant Review of the MIP images confirms the above findings IMPRESSION: 1. 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 No large vessel occlusion or other hemodynamically significant stenosis. 2.  No hemodynamically significant stenosis in the neck. 3. Redemonstrated low-density in the right cerebellum, consistent with infarct. No additional acute intracranial process. Electronically Signed   By: Merilyn Baba M.D.   On: 05/06/2021 15:57        Scheduled Meds:   stroke: mapping our early stages of recovery book   Does not apply Once   allopurinol  300 mg Oral Daily   aspirin  81 mg Oral Daily   atorvastatin  80 mg Oral Daily   chlorthalidone  25 mg Oral Daily   clopidogrel  75 mg Oral Daily   feeding supplement  237 mL Oral BID BM   heparin injection (subcutaneous)  5,000 Units Subcutaneous Q8H   hydrALAZINE  25 mg Oral Q8H   insulin aspart  0-15 Units Subcutaneous TID WC   insulin aspart  3 Units Subcutaneous TID WC   insulin detemir  7 Units Subcutaneous BID   lisinopril  40 mg Oral Daily   metoprolol succinate  50 mg Oral Daily   multivitamin with minerals  1 tablet Oral Daily   sodium chloride flush  3 mL Intravenous Q12H   Continuous Infusions:  sodium chloride 50 mL/hr at 05/08/21 0645     LOS: 3 days      Phillips Climes, MD [facility]   To contact the attending provider between 7A-7P or the covering provider during after hours 7P-7A, please log into the web site [email or web address] and access using universal [place] password for that web site. If you do not have the password, please call the hospital operator.  05/08/2021, 11:10 AM   Patient ID: Ayotunde Kasparek, male   DOB: Aug 09, 1945, 76 y.o.   MRN: [id number] Patient ID: Jabril Yell, male   DOB: [birth year], 76 y.o.   MRN: [id number] Patient ID: Takuya Topf, male   DOB: Apr 24, 1946, 76 y.o.   MRN: [id number]

## 2021-05-08 NOTE — [place] FL2 (Signed)
[place] MEDICAID FL2 LEVEL OF CARE SCREENING TOOL     IDENTIFICATION  Patient Name: Manuel Chapman Birthdate: 23-Sep-1945 Sex: male Admission Date (Current Location): 05/05/2021  [facility] and [place] Number:  Producer, television/film/video and Address:  [facility], [address] [place], [place] [address]      Provider Number: [id number]  Attending Physician Name and Address:  Elgergawy, Leana Roe, MD  Relative Name and Phone Number:       Current Level of Care: Hospital Recommended Level of Care: Skilled Nursing Facility Prior Approval Number:    Date Approved/Denied:   PASRR Number: [id number]  Discharge Plan: SNF    Current Diagnoses: Patient Active Problem List   Diagnosis Date Noted   Acute CVA (cerebrovascular accident) (HCC) 05/05/2021   Pressure injury of skin 11/30/2019   Hyperglycemia    Hypokalemia    Acute blood loss anemia    ICH (intracerebral hemorrhage) (HCC) 11/29/2019   Gout 05/10/2015   HTN (hypertension) 05/10/2015   Dyslipidemia 05/10/2015   Diabetes (HCC) 04/10/2015    Orientation RESPIRATION BLADDER Height & Weight     Self, Place  Normal Incontinent, External catheter Weight: 198 lb 13.7 oz (90.2 kg) Height:  5\' 7"  (170.2 cm)  BEHAVIORAL SYMPTOMS/MOOD NEUROLOGICAL BOWEL NUTRITION STATUS      Incontinent Diet (see dc summary)  AMBULATORY STATUS COMMUNICATION OF NEEDS Skin   Extensive Assist Verbally Normal                       Personal Care Assistance Level of Assistance  Bathing, Feeding, Dressing Bathing Assistance: Maximum assistance Feeding assistance: Limited assistance Dressing Assistance: Maximum assistance     Functional Limitations Info  Sight, Hearing Sight Info: Impaired Hearing Info: Impaired      SPECIAL CARE FACTORS FREQUENCY  PT (By licensed PT), OT (By licensed OT)     PT Frequency: 5x/week OT Frequency: 5x/week            Contractures Contractures Info: Not present     Additional Factors Info  Code Status, Allergies, Insulin Sliding Scale Code Status Info: Full Allergies Info: NKA   Insulin Sliding Scale Info: See dc summary       Current Medications (05/08/2021):  This is the current hospital active medication list Current Facility-Administered Medications  Medication Dose Route Frequency Provider Last Rate Last Admin    stroke: mapping our early stages of recovery book   Does not apply Once 05/10/2021, MD       0.9 %  sodium chloride infusion   Intravenous Continuous Elgergawy, Jonah Blue, MD 50 mL/hr at 05/08/21 1100 Infusion Verify at 05/08/21 1100   acetaminophen (TYLENOL) tablet 650 mg  650 mg Oral Q4H PRN 05/10/21, MD       Or   acetaminophen (TYLENOL) 160 MG/5ML solution 650 mg  650 mg Per Tube Q4H PRN Jonah Blue, MD       Or   acetaminophen (TYLENOL) suppository 650 mg  650 mg Rectal Q4H PRN Jonah Blue, MD       allopurinol (ZYLOPRIM) tablet 300 mg  300 mg Oral Daily Jonah Blue, MD   300 mg at 05/08/21 05/10/21   aspirin chewable tablet 81 mg  81 mg Oral Daily 8101, MD   81 mg at 05/08/21 0828   atorvastatin (LIPITOR) tablet 80 mg  80 mg Oral Daily 05/10/21, MD   80 mg at 05/08/21 05/10/21   chlorthalidone (  HYGROTON) tablet 25 mg  25 mg Oral Daily Jonah Blue, MD   25 mg at 05/08/21 0350   clopidogrel (PLAVIX) tablet 75 mg  75 mg Oral Daily Marvel Plan, MD   75 mg at 05/08/21 0938   feeding supplement (ENSURE ENLIVE / ENSURE PLUS) liquid 237 mL  237 mL Oral BID BM Elgergawy, Leana Roe, MD   237 mL at 05/08/21 0829   feeding supplement (GLUCERNA SHAKE) (GLUCERNA SHAKE) liquid 237 mL  237 mL Oral TID BM Elgergawy, Leana Roe, MD       heparin injection 5,000 Units  5,000 Units Subcutaneous Q8H Elgergawy, Leana Roe, MD   5,000 Units at 05/08/21 1444   hydrALAZINE (APRESOLINE) injection 5 mg  5 mg Intravenous Q4H PRN Elgergawy, Leana Roe, MD   5 mg at 05/08/21 0827   hydrALAZINE (APRESOLINE) tablet 50 mg  50 mg  Oral Q8H Elgergawy, Leana Roe, MD   50 mg at 05/08/21 1444   insulin aspart (novoLOG) injection 0-15 Units  0-15 Units Subcutaneous TID WC Jonah Blue, MD   2 Units at 05/08/21 1227   insulin aspart (novoLOG) injection 3 Units  3 Units Subcutaneous TID WC Elgergawy, Leana Roe, MD   3 Units at 05/08/21 1228   lisinopril (ZESTRIL) tablet 40 mg  40 mg Oral Daily Jonah Blue, MD   40 mg at 05/08/21 1829   metoprolol succinate (TOPROL-XL) 24 hr tablet 50 mg  50 mg Oral Daily Jonah Blue, MD   50 mg at 05/08/21 9371   multivitamin with minerals tablet 1 tablet  1 tablet Oral Daily Elgergawy, Leana Roe, MD   1 tablet at 05/08/21 0828   ondansetron (ZOFRAN) injection 4 mg  4 mg Intravenous Q6H PRN Jonah Blue, MD   4 mg at 05/05/21 1428   senna-docusate (Senokot-S) tablet 1 tablet  1 tablet Oral QHS PRN Jonah Blue, MD       sodium chloride flush (NS) 0.9 % injection 3 mL  3 mL Intravenous Q12H Jonah Blue, MD   3 mL at 05/08/21 0829   sodium chloride flush (NS) 0.9 % injection 3 mL  3 mL Intravenous PRN Jonah Blue, MD         Discharge Medications: Please see discharge summary for a list of discharge medications.  Relevant Imaging Results:  Relevant Lab Results:   Additional Information SSN [id number].  Mearl Latin, LCSW

## 2021-05-08 NOTE — Care Management Important Message (Signed)
Important Message  Patient Details  Name: Drayce Tawil MRN: [id number] Date of Birth: Aug 19, 1945   Medicare Important Message Given:  Yes     Sherilyn Banker 05/08/2021, 3:51 PM

## 2021-05-08 NOTE — H&P (Incomplete)
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  Per chart review patient currently lives with a friend.  He is married but currently separated.  Patient does have good family support.  Patient was able to feed himself dress groom and toilet himself but does have a sedentary lifestyle.  Caregiver/friend does assist with some meal preparation and medication management.  Presented 05/05/2021 with dizziness and vomiting x3 days.  CT/MRI showed large region of acute/early subacute infarct affecting the inferior cerebellum on the right consistent with right PICA territory infarction.  Petechial blood products but no frank hematoma.  Mass-effect upon the fourth ventricle without evidence of obstruction.  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  No large vessel occlusion or hemodynamically significant stenosis.  Admission chemistry unremarkable except glucose 300, hemoglobin 10.5, WBC 11,400, urine drug screen negative, hemoglobin A1c 9.9.  Echocardiogram with ejection fraction of 45 to [id number] grade 2 diastolic dysfunction.  Neurology follow-up maintained on aspirin and Plavix DAPT for 3 weeks then Plavix alone.  Patient declined loop recorder but plan 30-day cardiac event monitor as outpatient.  Subcutaneous heparin for DVT prophylaxis.  Tolerating a regular consistency diet.  Therapy evaluations completed due to patient decreased functional mobility was admitted for a comprehensive rehab program.  Review of Systems  Constitutional:  Negative for chills and  fever.  HENT:  Negative for hearing loss.   Eyes:  Negative for blurred vision and double vision.  Respiratory:  Negative for cough and shortness of breath.   Cardiovascular:  Negative for chest pain, palpitations and leg swelling.  Gastrointestinal:  Positive for constipation and nausea. Negative for vomiting.  Genitourinary:  Negative for dysuria, flank pain and hematuria.  Musculoskeletal:  Positive for joint pain and myalgias.  Skin:  Negative for rash.  Neurological:  Positive for dizziness, weakness and headaches.  All other systems reviewed and are negative. Past Medical History:  Diagnosis Date   CVA (cerebral vascular accident) ([place]) 2020   Diabetes mellitus    Hypertension    ICH (intracerebral hemorrhage) ([place]) 11/2019   History reviewed. No pertinent surgical history. Family History  Problem Relation Age of Onset   Diabetes Neg Hx    Stroke Neg Hx    Social History:  reports that he quit smoking about 25 years ago. His smoking use included cigarettes. He has a 10.00 pack-year smoking history. He has never used smokeless tobacco. He reports current alcohol use. He reports that he does not use drugs. Allergies: No Known Allergies Medications Prior to Admission  Medication Sig Dispense Refill   allopurinol (ZYLOPRIM) 300 MG tablet Take 300 mg by mouth daily.     atorvastatin (LIPITOR) 40 MG tablet Take 40 mg by mouth daily.     chlorthalidone (HYGROTON) 25 MG tablet Take 25 mg by mouth daily.     diphenhydrAMINE-APAP, sleep, (TYLENOL PM EXTRA STRENGTH PO) Take 1 tablet by mouth at bedtime as needed (sleep).     glimepiride (AMARYL) 2 MG tablet Take 2 mg by mouth daily.     lisinopril (ZESTRIL) 40 MG  tablet Take 1 tablet (40 mg total) by mouth daily. 30 tablet 1   metFORMIN (GLUCOPHAGE) 1000 MG tablet Take 1,000 mg by mouth daily with breakfast.     metoprolol succinate (TOPROL-XL) 50 MG 24 hr tablet Take 1 tablet (50 mg total) by mouth daily. Take with or immediately  following a meal. 30 tablet 1   pioglitazone (ACTOS) 45 MG tablet Take 45 mg by mouth daily.     acetaminophen (TYLENOL) 325 MG tablet Take 2 tablets (650 mg total) by mouth every 4 (four) hours as needed for mild pain (or temp > 37.5 C (99.5 F)). (Patient not taking: Reported on 05/05/2021) 180 tablet 0   amLODipine (NORVASC) 10 MG tablet Take 1 tablet (10 mg total) by mouth daily. (Patient not taking: Reported on 05/05/2021) 30 tablet 1   aspirin 81 MG chewable tablet Chew 1 tablet (81 mg total) by mouth daily. (Patient not taking: Reported on 05/05/2021) 30 tablet 1   insulin detemir (LEVEMIR) 100 UNIT/ML injection Inject 0.15 mLs (15 Units total) into the skin 2 (two) times daily. (Patient not taking: Reported on 05/05/2021) 10 mL 11    Drug Regimen Review Drug regimen was reviewed and remains appropriate with no significant issues identified  Home: Home Living Family/patient expects to be discharged to:: Private residence Living Arrangements: Spouse/significant other (pt's wife arrived at end of session (they do not live together)) Available Help at Discharge: Available 24 hours/day Type of Home: House Home Access: Stairs to enter [employer] of Steps: 1 Entrance Stairs-Rails: None Home Layout: One level Bathroom Shower/Tub: Chiropodist: Standard Home Equipment: Kasandra Knudsen - single point, Conservation officer, nature (2 wheels)  Lives With: Other (Comment) Coralyn Mark, 24 hour caregiver)   Functional History: Prior Function Prior Level of Function : Needs assist Physical Assist : ADLs (physical) ADLs (physical): Bathing, IADLs Mobility Comments: pt used RW for mobility ADLs Comments: Pt's wife reports that pt's caregiver helped him with bathing, meal prep and medication mgt; pt was able to feed, dress, groom and toilet himself  Functional Status:  Mobility: Bed Mobility Overal bed mobility: Needs Assistance Bed Mobility: Rolling (scooting up the bed) Rolling: Min  assist Sidelying to sit: Min assist Supine to sit: Mod assist, +2 for physical assistance General bed mobility comments: mod assist to scoot up the bed with cues which pt can follow with extra time Transfers Overall transfer level: Needs assistance Equipment used: 2 person hand held assist, Rolling walker (2 wheels) Transfers: Sit to/from Stand, Bed to chair/wheelchair/BSC Sit to Stand: +2 physical assistance, +2 safety/equipment, Min assist Bed to/from chair/wheelchair/BSC transfer type:: Stand pivot Stand pivot transfers: +2 physical assistance, +2 safety/equipment, Min assist General transfer comment: declines Ambulation/Gait General Gait Details: pivotal steps only    ADL: ADL Overall ADL's : Needs assistance/impaired Eating/Feeding: Set up, Supervision/ safety, Sitting Grooming: Wash/dry hands, Wash/dry face, Min guard, Sitting Upper Body Bathing: Moderate assistance Lower Body Bathing: Maximal assistance Upper Body Dressing : Moderate assistance Lower Body Dressing: Maximal assistance Toilet Transfer: Moderate assistance, +2 for safety/equipment, Cueing for safety, Cueing for sequencing, Stand-pivot Toileting- Clothing Manipulation and Hygiene: Maximal assistance Functional mobility during ADLs: Moderate assistance, +2 for physical assistance, +2 for safety/equipment, Cueing for safety, Cueing for sequencing  Cognition: Cognition Overall Cognitive Status: No family/caregiver present to determine baseline cognitive functioning Arousal/Alertness: Awake/alert Orientation Level: Oriented to person, Oriented to place, Oriented to situation, Disoriented to time Attention: Selective Selective Attention: Impaired Selective Attention Impairment: Verbal basic Memory: Impaired Memory Impairment: Storage  deficit Awareness: Impaired Awareness Impairment: Intellectual impairment Safety/Judgment: Impaired Cognition Arousal/Alertness: Awake/alert Behavior During Therapy: Flat  affect Overall Cognitive Status: No family/caregiver present to determine baseline cognitive functioning Area of Impairment: Orientation, Attention, Memory, Following commands Orientation Level: Time, Situation Current Attention Level: Alternating Memory: Decreased short-term memory Following Commands: Follows one step commands consistently Safety/Judgement: Decreased awareness of safety, Decreased awareness of deficits Awareness: Emergent Problem Solving: Slow processing General Comments: able to follow one step commands with slight delay;  Physical Exam: Blood pressure (!) 142/77, pulse 81, temperature 98.5 F (36.9 C), temperature source Oral, resp. rate 20, height 5\' 7"  (1.702 m), weight 90.2 kg, SpO2 95 %. Physical Exam Neurological:     Comments: Patient is alert.  Makes eye contact with examiner.  Very mild dysarthria.  Follows commands.  Provides name and age.  He does have some selective attention.    Results for orders placed or performed during the hospital encounter of 05/05/21 (from the past 48 hour(s))  Glucose, capillary     Status: Abnormal   Collection Time: 05/07/21 12:30 PM  Result Value Ref Range   Glucose-Capillary 200 (H) 70 - 99 mg/dL    Comment: Glucose reference range applies only to samples taken after fasting for at least 8 hours.  Glucose, capillary     Status: Abnormal   Collection Time: 05/07/21  4:49 PM  Result Value Ref Range   Glucose-Capillary 164 (H) 70 - 99 mg/dL    Comment: Glucose reference range applies only to samples taken after fasting for at least 8 hours.  Glucose, capillary     Status: None   Collection Time: 05/07/21  8:39 PM  Result Value Ref Range   Glucose-Capillary 81 70 - 99 mg/dL    Comment: Glucose reference range applies only to samples taken after fasting for at least 8 hours.  Glucose, capillary     Status: Abnormal   Collection Time: 05/08/21  2:21 AM  Result Value Ref Range   Glucose-Capillary 225 (H) 70 - 99 mg/dL     Comment: Glucose reference range applies only to samples taken after fasting for at least 8 hours.  Glucose, capillary     Status: Abnormal   Collection Time: 05/08/21  7:43 AM  Result Value Ref Range   Glucose-Capillary 197 (H) 70 - 99 mg/dL    Comment: Glucose reference range applies only to samples taken after fasting for at least 8 hours.  Glucose, capillary     Status: Abnormal   Collection Time: 05/08/21 11:54 AM  Result Value Ref Range   Glucose-Capillary 121 (H) 70 - 99 mg/dL    Comment: Glucose reference range applies only to samples taken after fasting for at least 8 hours.  Glucose, capillary     Status: Abnormal   Collection Time: 05/08/21  3:56 PM  Result Value Ref Range   Glucose-Capillary 51 (L) 70 - 99 mg/dL    Comment: Glucose reference range applies only to samples taken after fasting for at least 8 hours.  Urinalysis, Routine w reflex microscopic Urine, Clean Catch     Status: Abnormal   Collection Time: 05/08/21  4:33 PM  Result Value Ref Range   Color, Urine YELLOW YELLOW   APPearance CLEAR CLEAR   Specific Gravity, Urine 1.015 1.005 - 1.030   pH 7.0 5.0 - 8.0   Glucose, UA NEGATIVE NEGATIVE mg/dL   Hgb urine dipstick TRACE (A) NEGATIVE   Bilirubin Urine NEGATIVE NEGATIVE   Ketones, ur NEGATIVE NEGATIVE mg/dL  Protein, ur 100 (A) NEGATIVE mg/dL   Nitrite NEGATIVE NEGATIVE   Leukocytes,Ua TRACE (A) NEGATIVE    Comment: Performed at [facility], [place] [address], [place], [place] [address]  Urinalysis, Microscopic (reflex)     Status: Abnormal   Collection Time: 05/08/21  4:33 PM  Result Value Ref Range   RBC / HPF 11-20 0 - 5 RBC/hpf   WBC, UA 0-5 0 - 5 WBC/hpf   Bacteria, UA RARE (A) NONE SEEN   Squamous Epithelial / LPF 0-5 0 - 5    Comment: Performed at [facility], [place] [address] [place], [place] [address]  Glucose, capillary     Status: Abnormal   Collection Time: 05/08/21  5:21 PM  Result Value Ref Range   Glucose-Capillary 117 (H) 70  - 99 mg/dL    Comment: Glucose reference range applies only to samples taken after fasting for at least 8 hours.  Glucose, capillary     Status: Abnormal   Collection Time: 05/08/21  7:59 PM  Result Value Ref Range   Glucose-Capillary 228 (H) 70 - 99 mg/dL    Comment: Glucose reference range applies only to samples taken after fasting for at least 8 hours.  Glucose, capillary     Status: Abnormal   Collection Time: 05/09/21  7:51 AM  Result Value Ref Range   Glucose-Capillary 193 (H) 70 - 99 mg/dL    Comment: Glucose reference range applies only to samples taken after fasting for at least 8 hours.  CBC     Status: Abnormal   Collection Time: 05/09/21  9:59 AM  Result Value Ref Range   WBC 9.5 4.0 - 10.5 K/uL   RBC 3.94 (L) 4.22 - 5.81 MIL/uL   Hemoglobin 11.1 (L) 13.0 - 17.0 g/dL   HCT 34.9 (L) 39.0 - 52.0 %   MCV 88.6 80.0 - 100.0 fL   MCH 28.2 26.0 - 34.0 pg   MCHC 31.8 30.0 - 36.0 g/dL   RDW 14.9 11.5 - 15.5 %   Platelets 321 150 - 400 K/uL   nRBC 0.0 0.0 - 0.2 %    Comment: Performed at [facility], [place] [address] [place], [place] [id number]  Basic metabolic panel     Status: Abnormal   Collection Time: 05/09/21  9:59 AM  Result Value Ref Range   Sodium 135 135 - 145 mmol/L   Potassium 3.7 3.5 - 5.1 mmol/L   Chloride 99 98 - 111 mmol/L   CO2 26 22 - 32 mmol/L   Glucose, Bld 259 (H) 70 - 99 mg/dL    Comment: Glucose reference range applies only to samples taken after fasting for at least 8 hours.   BUN 16 8 - 23 mg/dL   Creatinine, Ser 1.13 0.61 - 1.24 mg/dL   Calcium 8.0 (L) 8.9 - 10.3 mg/dL   GFR, Estimated >60 >60 mL/min    Comment: (NOTE) Calculated using the CKD-EPI Creatinine Equation (2021)    Anion gap 10 5 - 15    Comment: Performed at [facility] [address] [place], [place] [address]   No results found.     Medical Problem List and Plan: 1. Functional deficits with dizziness and gait abnormality secondary to right PICA infarction as  well as history of left ICH subcortical and right temporal embolic infarct [id number]  -patient may *** shower  -ELOS/Goals: *** 2.  Antithrombotics: -DVT/anticoagulation:  Pharmaceutical: Heparin  -antiplatelet therapy: Aspirin 81  mg daily and Plavix 75 mg day x3 weeks then Plavix alone 3. Pain Management: Tylenol as needed 4. Mood: Provide emotional support  -antipsychotic agents: N/A 5. Neuropsych: This patient is capable of making decisions on his own behalf. 6. Skin/Wound Care: Routine skin checks 7. Fluids/Electrolytes/Nutrition: Routine in and outs with follow-up chemistries 8.  Diabetes mellitus.  Hemoglobin A1c 9.9.  NovoLog 3 units 3 times daily, Levemir  units  daily.  Check blood sugars before meals and at bedtime 9.  Hypertension.  Lisinopril 40 mg daily, Toprol-XL 50 mg daily, hydralazine 50 mg every 8 hours, Hy[place]ton 25 mg daily.  Monitor with increased mobility 10.  Hyperlipidemia.  Lipitor 11.  History of gout.  Allopurinol.  Monitor for any gout flareups    Cathlyn Parsons, PA-C 05/09/2021

## 2021-05-09 ENCOUNTER — Inpatient Hospital Stay (HOSPITAL_COMMUNITY)
Admission: RE | Admit: 2021-05-09 | Discharge: 2021-07-02 | DRG: 057 | Disposition: A | Discharge status: Skilled Nursing Facility | Payer: Medicare HMO | Source: Ambulatory Visit | Attending: Physical Medicine & Rehabilitation | Admitting: Physical Medicine & Rehabilitation

## 2021-05-09 DIAGNOSIS — Z87891 Personal history of nicotine dependence: Secondary | ICD-10-CM

## 2021-05-09 DIAGNOSIS — I69354 Hemiplegia and hemiparesis following cerebral infarction affecting left non-dominant side: Secondary | ICD-10-CM | POA: Diagnosis present

## 2021-05-09 DIAGNOSIS — M75102 Unspecified rotator cuff tear or rupture of left shoulder, not specified as traumatic: Secondary | ICD-10-CM | POA: Diagnosis present

## 2021-05-09 DIAGNOSIS — E1165 Type 2 diabetes mellitus with hyperglycemia: Secondary | ICD-10-CM

## 2021-05-09 DIAGNOSIS — I6300 Cerebral infarction due to thrombosis of unspecified precerebral artery: Secondary | ICD-10-CM

## 2021-05-09 DIAGNOSIS — R7309 Other abnormal glucose: Secondary | ICD-10-CM

## 2021-05-09 DIAGNOSIS — Z20822 Contact with and (suspected) exposure to covid-19: Secondary | ICD-10-CM | POA: Diagnosis present

## 2021-05-09 DIAGNOSIS — M109 Gout, unspecified: Secondary | ICD-10-CM | POA: Diagnosis present

## 2021-05-09 DIAGNOSIS — R001 Bradycardia, unspecified: Secondary | ICD-10-CM | POA: Diagnosis not present

## 2021-05-09 DIAGNOSIS — Z7984 Long term (current) use of oral hypoglycemic drugs: Secondary | ICD-10-CM

## 2021-05-09 DIAGNOSIS — E11649 Type 2 diabetes mellitus with hypoglycemia without coma: Secondary | ICD-10-CM | POA: Diagnosis not present

## 2021-05-09 DIAGNOSIS — I350 Nonrheumatic aortic (valve) stenosis: Secondary | ICD-10-CM | POA: Diagnosis present

## 2021-05-09 DIAGNOSIS — I10 Essential (primary) hypertension: Secondary | ICD-10-CM

## 2021-05-09 DIAGNOSIS — E785 Hyperlipidemia, unspecified: Secondary | ICD-10-CM | POA: Diagnosis present

## 2021-05-09 DIAGNOSIS — I69322 Dysarthria following cerebral infarction: Secondary | ICD-10-CM

## 2021-05-09 DIAGNOSIS — M7521 Bicipital tendinitis, right shoulder: Secondary | ICD-10-CM | POA: Diagnosis present

## 2021-05-09 DIAGNOSIS — M7582 Other shoulder lesions, left shoulder: Secondary | ICD-10-CM

## 2021-05-09 DIAGNOSIS — I69391 Dysphagia following cerebral infarction: Secondary | ICD-10-CM

## 2021-05-09 DIAGNOSIS — I69319 Unspecified symptoms and signs involving cognitive functions following cerebral infarction: Secondary | ICD-10-CM

## 2021-05-09 DIAGNOSIS — E119 Type 2 diabetes mellitus without complications: Secondary | ICD-10-CM | POA: Diagnosis present

## 2021-05-09 DIAGNOSIS — R1310 Dysphagia, unspecified: Secondary | ICD-10-CM | POA: Diagnosis present

## 2021-05-09 DIAGNOSIS — I639 Cerebral infarction, unspecified: Secondary | ICD-10-CM

## 2021-05-09 DIAGNOSIS — Z7982 Long term (current) use of aspirin: Secondary | ICD-10-CM

## 2021-05-09 DIAGNOSIS — Z794 Long term (current) use of insulin: Secondary | ICD-10-CM

## 2021-05-09 DIAGNOSIS — Z79899 Other long term (current) drug therapy: Secondary | ICD-10-CM

## 2021-05-09 DIAGNOSIS — I6330 Cerebral infarction due to thrombosis of unspecified cerebral artery: Secondary | ICD-10-CM | POA: Diagnosis present

## 2021-05-09 LAB — CBC
HCT: 34.9 % — ABNORMAL LOW (ref 39.0–52.0)
HCT: 32.1 % — ABNORMAL LOW (ref 39.0–52.0)
Hemoglobin: 11.1 g/dL — ABNORMAL LOW (ref 13.0–17.0)
Hemoglobin: 10.6 g/dL — ABNORMAL LOW (ref 13.0–17.0)
MCH: 28.2 pg (ref 26.0–34.0)
MCH: 29.3 pg (ref 26.0–34.0)
MCHC: 31.8 g/dL (ref 30.0–36.0)
MCHC: 33 g/dL (ref 30.0–36.0)
MCV: 88.6 fL (ref 80.0–100.0)
MCV: 88.7 fL (ref 80.0–100.0)
Platelets: 321 10*3/uL (ref 150–400)
Platelets: 292 10*3/uL (ref 150–400)
RBC: 3.94 MIL/uL — ABNORMAL LOW (ref 4.22–5.81)
RBC: 3.62 MIL/uL — ABNORMAL LOW (ref 4.22–5.81)
RDW: 14.9 % (ref 11.5–15.5)
RDW: 14.9 % (ref 11.5–15.5)
WBC: 9.5 10*3/uL (ref 4.0–10.5)
WBC: 13.2 10*3/uL — ABNORMAL HIGH (ref 4.0–10.5)
nRBC: 0 % (ref 0.0–0.2)
nRBC: 0 % (ref 0.0–0.2)

## 2021-05-09 LAB — CREATININE, SERUM
Creatinine, Ser: 1.27 mg/dL — ABNORMAL HIGH (ref 0.61–1.24)
GFR, Estimated: 58 mL/min — ABNORMAL LOW (ref >60)

## 2021-05-09 LAB — BASIC METABOLIC PANEL
Anion gap: 10 (ref 5–15)
BUN: 16 mg/dL (ref 8–23)
CO2: 26 mmol/L (ref 22–32)
Calcium: 8 mg/dL — ABNORMAL LOW (ref 8.9–10.3)
Chloride: 99 mmol/L (ref 98–111)
Creatinine, Ser: 1.13 mg/dL (ref 0.61–1.24)
GFR, Estimated: >60 mL/min (ref >60)
Glucose, Bld: 259 mg/dL — ABNORMAL HIGH (ref 70–99)
Potassium: 3.7 mmol/L (ref 3.5–5.1)
Sodium: 135 mmol/L (ref 135–145)

## 2021-05-09 LAB — GLUCOSE, CAPILLARY
Glucose-Capillary: 193 mg/dL — ABNORMAL HIGH (ref 70–99)
Glucose-Capillary: 280 mg/dL — ABNORMAL HIGH (ref 70–99)
Glucose-Capillary: 267 mg/dL — ABNORMAL HIGH (ref 70–99)
Glucose-Capillary: 146 mg/dL — ABNORMAL HIGH (ref 70–99)

## 2021-05-09 MED ORDER — ADULT MULTIVITAMIN W/MINERALS CH
1.0000 | ORAL_TABLET | Freq: Every day | ORAL | Status: DC
  Administered 2021-05-10 – 2021-07-02 (×52): 1 via ORAL
  Filled 2021-05-09 (×52): qty 1

## 2021-05-09 MED ORDER — INSULIN ASPART 100 UNIT/ML IJ SOLN
0.0000 [IU] | Freq: Three times a day (TID) | INTRAMUSCULAR | Status: DC
  Administered 2021-05-09: 8 [IU] via SUBCUTANEOUS
  Administered 2021-05-10 – 2021-05-11 (×3): 3 [IU] via SUBCUTANEOUS
  Administered 2021-05-11: 5 [IU] via SUBCUTANEOUS
  Administered 2021-05-12: 2 [IU] via SUBCUTANEOUS
  Administered 2021-05-12 (×2): 3 [IU] via SUBCUTANEOUS
  Administered 2021-05-13: 2 [IU] via SUBCUTANEOUS
  Administered 2021-05-13: 5 [IU] via SUBCUTANEOUS
  Administered 2021-05-13: 2 [IU] via SUBCUTANEOUS
  Administered 2021-05-14: 5 [IU] via SUBCUTANEOUS
  Administered 2021-05-14 – 2021-05-15 (×4): 3 [IU] via SUBCUTANEOUS
  Administered 2021-05-16 (×3): 2 [IU] via SUBCUTANEOUS
  Administered 2021-05-17: 3 [IU] via SUBCUTANEOUS
  Administered 2021-05-17: 2 [IU] via SUBCUTANEOUS
  Administered 2021-05-17: 5 [IU] via SUBCUTANEOUS
  Administered 2021-05-18 – 2021-05-19 (×4): 3 [IU] via SUBCUTANEOUS
  Administered 2021-05-19 (×2): 2 [IU] via SUBCUTANEOUS
  Administered 2021-05-20: 13:00:00 3 [IU] via SUBCUTANEOUS
  Administered 2021-05-21: 2 [IU] via SUBCUTANEOUS
  Administered 2021-05-22: 3 [IU] via SUBCUTANEOUS
  Administered 2021-05-23: 5 [IU] via SUBCUTANEOUS
  Administered 2021-05-23: 3 [IU] via SUBCUTANEOUS
  Administered 2021-05-24: 5 [IU] via SUBCUTANEOUS
  Administered 2021-05-24 – 2021-05-25 (×3): 3 [IU] via SUBCUTANEOUS
  Administered 2021-05-26: 2 [IU] via SUBCUTANEOUS
  Administered 2021-05-26: 3 [IU] via SUBCUTANEOUS
  Administered 2021-05-27 (×2): 5 [IU] via SUBCUTANEOUS
  Administered 2021-05-27 – 2021-05-28 (×3): 3 [IU] via SUBCUTANEOUS
  Administered 2021-05-28: 5 [IU] via SUBCUTANEOUS
  Administered 2021-05-29: 3 [IU] via SUBCUTANEOUS
  Administered 2021-05-29: 2 [IU] via SUBCUTANEOUS
  Administered 2021-05-29: 5 [IU] via SUBCUTANEOUS
  Administered 2021-05-30: 3 [IU] via SUBCUTANEOUS
  Administered 2021-05-30: 5 [IU] via SUBCUTANEOUS
  Administered 2021-05-30 – 2021-05-31 (×2): 3 [IU] via SUBCUTANEOUS
  Administered 2021-05-31 (×2): 5 [IU] via SUBCUTANEOUS
  Administered 2021-06-01 (×2): 3 [IU] via SUBCUTANEOUS
  Administered 2021-06-01: 5 [IU] via SUBCUTANEOUS
  Administered 2021-06-02: 2 [IU] via SUBCUTANEOUS
  Administered 2021-06-02: 3 [IU] via SUBCUTANEOUS
  Administered 2021-06-02 – 2021-06-03 (×2): 5 [IU] via SUBCUTANEOUS
  Administered 2021-06-03 – 2021-06-04 (×4): 3 [IU] via SUBCUTANEOUS
  Administered 2021-06-05 (×2): 2 [IU] via SUBCUTANEOUS
  Administered 2021-06-05: 5 [IU] via SUBCUTANEOUS
  Administered 2021-06-06: 8 [IU] via SUBCUTANEOUS
  Administered 2021-06-06: 3 [IU] via SUBCUTANEOUS
  Administered 2021-06-07 (×2): 2 [IU] via SUBCUTANEOUS
  Administered 2021-06-07: 3 [IU] via SUBCUTANEOUS
  Administered 2021-06-08 (×3): 2 [IU] via SUBCUTANEOUS
  Administered 2021-06-09: 3 [IU] via SUBCUTANEOUS
  Administered 2021-06-09: 5 [IU] via SUBCUTANEOUS
  Administered 2021-06-09 – 2021-06-11 (×3): 3 [IU] via SUBCUTANEOUS
  Administered 2021-06-11 (×2): 2 [IU] via SUBCUTANEOUS
  Administered 2021-06-12 (×2): 3 [IU] via SUBCUTANEOUS
  Administered 2021-06-13: 5 [IU] via SUBCUTANEOUS
  Administered 2021-06-13 – 2021-06-14 (×2): 3 [IU] via SUBCUTANEOUS
  Administered 2021-06-15: 2 [IU] via SUBCUTANEOUS
  Administered 2021-06-15 (×2): 3 [IU] via SUBCUTANEOUS
  Administered 2021-06-16 – 2021-06-17 (×3): 2 [IU] via SUBCUTANEOUS
  Administered 2021-06-18: 3 [IU] via SUBCUTANEOUS
  Administered 2021-06-18 – 2021-06-20 (×3): 2 [IU] via SUBCUTANEOUS
  Administered 2021-06-20: 5 [IU] via SUBCUTANEOUS
  Administered 2021-06-21 (×3): 2 [IU] via SUBCUTANEOUS
  Administered 2021-06-22: 3 [IU] via SUBCUTANEOUS
  Administered 2021-06-22: 2 [IU] via SUBCUTANEOUS
  Administered 2021-06-23 – 2021-06-25 (×4): 3 [IU] via SUBCUTANEOUS
  Administered 2021-06-26: 2 [IU] via SUBCUTANEOUS
  Administered 2021-06-26: 5 [IU] via SUBCUTANEOUS
  Administered 2021-06-27: 2 [IU] via SUBCUTANEOUS
  Administered 2021-06-28: 3 [IU] via SUBCUTANEOUS
  Administered 2021-06-28 – 2021-06-29 (×2): 2 [IU] via SUBCUTANEOUS
  Administered 2021-06-30: 3 [IU] via SUBCUTANEOUS
  Administered 2021-06-30 – 2021-07-02 (×3): 2 [IU] via SUBCUTANEOUS

## 2021-05-09 MED ORDER — CLOPIDOGREL BISULFATE 75 MG PO TABS
75.0000 mg | ORAL_TABLET | Freq: Every day | ORAL | Status: DC
  Administered 2021-05-10 – 2021-07-02 (×54): 75 mg via ORAL
  Filled 2021-05-09 (×54): qty 1

## 2021-05-09 MED ORDER — INSULIN DETEMIR 100 UNIT/ML ~~LOC~~ SOLN
5.0000 [IU] | Freq: Every day | SUBCUTANEOUS | Status: DC
  Administered 2021-05-09: 5 [IU] via SUBCUTANEOUS
  Filled 2021-05-09: qty 0.05

## 2021-05-09 MED ORDER — ACETAMINOPHEN 650 MG RE SUPP: 650.0000 mg | RECTAL | Status: DC | PRN

## 2021-05-09 MED ORDER — ADULT MULTIVITAMIN W/MINERALS CH: 1.0000 | ORAL_TABLET | Freq: Every day | ORAL | Status: AC

## 2021-05-09 MED ORDER — HEPARIN SODIUM (PORCINE) 5000 UNIT/ML IJ SOLN: 5000.0000 [IU] | Freq: Three times a day (TID) | INTRAMUSCULAR | Status: DC

## 2021-05-09 MED ORDER — HYDRALAZINE HCL 50 MG PO TABS: 50.0000 mg | ORAL_TABLET | Freq: Three times a day (TID) | ORAL | Status: DC

## 2021-05-09 MED ORDER — INSULIN DETEMIR 100 UNIT/ML ~~LOC~~ SOLN
5.0000 [IU] | Freq: Every day | SUBCUTANEOUS | Status: DC
  Administered 2021-05-10 – 2021-05-15 (×6): 5 [IU] via SUBCUTANEOUS
  Filled 2021-05-09 (×6): qty 0.05

## 2021-05-09 MED ORDER — INSULIN ASPART 100 UNIT/ML IJ SOLN: 0.0000 [IU] | Freq: Three times a day (TID) | INTRAMUSCULAR | 11 refills | Status: DC

## 2021-05-09 MED ORDER — ALLOPURINOL 300 MG PO TABS
300.0000 mg | ORAL_TABLET | Freq: Every day | ORAL | Status: DC
Start: 2021-05-10 — End: 2021-07-02
  Administered 2021-05-10 – 2021-07-02 (×54): 300 mg via ORAL
  Filled 2021-05-09 (×54): qty 1

## 2021-05-09 MED ORDER — HEPARIN SODIUM (PORCINE) 5000 UNIT/ML IJ SOLN
5000.0000 [IU] | Freq: Three times a day (TID) | INTRAMUSCULAR | Status: DC
  Administered 2021-05-09 – 2021-07-02 (×162): 5000 [IU] via SUBCUTANEOUS
  Filled 2021-05-09 (×159): qty 1

## 2021-05-09 MED ORDER — INSULIN ASPART 100 UNIT/ML IJ SOLN
3.0000 [IU] | Freq: Three times a day (TID) | INTRAMUSCULAR | Status: DC
  Administered 2021-05-10 – 2021-06-21 (×121): 3 [IU] via SUBCUTANEOUS

## 2021-05-09 MED ORDER — ASPIRIN 81 MG PO CHEW: 81.0000 mg | CHEWABLE_TABLET | Freq: Every day | ORAL | Status: DC

## 2021-05-09 MED ORDER — CLOPIDOGREL BISULFATE 75 MG PO TABS: 75.0000 mg | ORAL_TABLET | Freq: Every day | ORAL | Status: AC

## 2021-05-09 MED ORDER — HYDRALAZINE HCL 50 MG PO TABS
50.0000 mg | ORAL_TABLET | Freq: Three times a day (TID) | ORAL | Status: DC
  Administered 2021-05-09 – 2021-05-13 (×11): 50 mg via ORAL
  Filled 2021-05-09 (×11): qty 1

## 2021-05-09 MED ORDER — ACETAMINOPHEN 160 MG/5ML PO SOLN
650.0000 mg | ORAL | Status: DC | PRN
  Filled 2021-05-09: qty 20.3

## 2021-05-09 MED ORDER — INSULIN DETEMIR 100 UNIT/ML ~~LOC~~ SOLN: 5.0000 [IU] | Freq: Every day | SUBCUTANEOUS | 11 refills | Status: DC

## 2021-05-09 MED ORDER — GLUCERNA SHAKE PO LIQD: 237.0000 mL | Freq: Three times a day (TID) | ORAL | 0 refills | Status: DC

## 2021-05-09 MED ORDER — METOPROLOL SUCCINATE ER 25 MG PO TB24
50.0000 mg | ORAL_TABLET | Freq: Every day | ORAL | Status: DC
  Administered 2021-05-10: 50 mg via ORAL
  Filled 2021-05-09: qty 2

## 2021-05-09 MED ORDER — ACETAMINOPHEN 325 MG PO TABS: 650.0000 mg | ORAL_TABLET | ORAL | Status: DC | PRN

## 2021-05-09 MED ORDER — CHLORTHALIDONE 25 MG PO TABS
25.0000 mg | ORAL_TABLET | Freq: Every day | ORAL | Status: DC
  Administered 2021-05-10 – 2021-07-02 (×54): 25 mg via ORAL
  Filled 2021-05-09 (×55): qty 1

## 2021-05-09 MED ORDER — ASPIRIN 81 MG PO CHEW
81.0000 mg | CHEWABLE_TABLET | Freq: Every day | ORAL | Status: DC
  Administered 2021-05-10 – 2021-05-24 (×15): 81 mg via ORAL
  Filled 2021-05-09 (×16): qty 1

## 2021-05-09 MED ORDER — SENNOSIDES-DOCUSATE SODIUM 8.6-50 MG PO TABS
1.0000 | ORAL_TABLET | Freq: Every evening | ORAL | Status: DC | PRN
  Administered 2021-06-19: 1 via ORAL
  Filled 2021-05-09: qty 1

## 2021-05-09 MED ORDER — LISINOPRIL 20 MG PO TABS
40.0000 mg | ORAL_TABLET | Freq: Every day | ORAL | Status: DC
  Administered 2021-05-10 – 2021-07-02 (×54): 40 mg via ORAL
  Filled 2021-05-09 (×58): qty 2

## 2021-05-09 MED ORDER — ATORVASTATIN CALCIUM 80 MG PO TABS: 80.0000 mg | ORAL_TABLET | Freq: Every day | ORAL | Status: DC

## 2021-05-09 MED ORDER — ATORVASTATIN CALCIUM 80 MG PO TABS
80.0000 mg | ORAL_TABLET | Freq: Every day | ORAL | Status: DC
  Administered 2021-05-10 – 2021-07-02 (×54): 80 mg via ORAL
  Filled 2021-05-09 (×54): qty 1

## 2021-05-09 NOTE — Plan of Care (Signed)
Problem: Education: Goal: Knowledge of General Education information will improve Description: Including pain rating scale, medication(s)/side effects and non-pharmacologic comfort measures Outcome: Completed/Met   Problem: Health Behavior/Discharge Planning: Goal: Ability to manage health-related needs will improve Outcome: Completed/Met   Problem: Clinical Measurements: Goal: Ability to maintain clinical measurements within normal limits will improve Outcome: Completed/Met Goal: Will remain free from infection Outcome: Completed/Met Goal: Diagnostic test results will improve Outcome: Completed/Met Goal: Respiratory complications will improve Outcome: Completed/Met Goal: Cardiovascular complication will be avoided Outcome: Completed/Met   Problem: Activity: Goal: Risk for activity intolerance will decrease Outcome: Completed/Met   Problem: Nutrition: Goal: Adequate nutrition will be maintained Outcome: Completed/Met   Problem: Coping: Goal: Level of anxiety will decrease Outcome: Completed/Met   Problem: Elimination: Goal: Will not experience complications related to bowel motility Outcome: Completed/Met Goal: Will not experience complications related to urinary retention Outcome: Completed/Met   Problem: Pain Managment: Goal: General experience of comfort will improve Outcome: Completed/Met   Problem: Safety: Goal: Ability to remain free from injury will improve Outcome: Completed/Met   Problem: Skin Integrity: Goal: Risk for impaired skin integrity will decrease Outcome: Completed/Met   Problem: Education: Goal: Knowledge of disease or condition will improve Outcome: Completed/Met Goal: Knowledge of secondary prevention will improve (SELECT ALL) Outcome: Completed/Met Goal: Knowledge of patient specific risk factors will improve (INDIVIDUALIZE FOR PATIENT) Outcome: Completed/Met Goal: Individualized Educational Video(s) Outcome: Completed/Met    Problem: Education: Goal: Knowledge of secondary prevention will improve (SELECT ALL) Outcome: Completed/Met Goal: Knowledge of patient specific risk factors will improve (INDIVIDUALIZE FOR PATIENT) Outcome: Completed/Met Goal: Individualized Educational Video(s) Outcome: Completed/Met   Problem: Education: Goal: Knowledge of disease or condition will improve Outcome: Completed/Met Goal: Knowledge of secondary prevention will improve (SELECT ALL) Outcome: Completed/Met Goal: Knowledge of patient specific risk factors will improve (INDIVIDUALIZE FOR PATIENT) Outcome: Completed/Met Goal: Individualized Educational Video(s) Outcome: Completed/Met   Problem: Coping: Goal: Will verbalize positive feelings about self Outcome: Completed/Met Goal: Will identify appropriate support needs Outcome: Completed/Met   Problem: Health Behavior/Discharge Planning: Goal: Ability to manage health-related needs will improve Outcome: Completed/Met   Problem: Self-Care: Goal: Ability to participate in self-care as condition permits will improve Outcome: Completed/Met Goal: Verbalization of feelings and concerns over difficulty with self-care will improve Outcome: Completed/Met Goal: Ability to communicate needs accurately will improve Outcome: Completed/Met   Problem: Nutrition: Goal: Dietary intake will improve Outcome: Completed/Met   Problem: Ischemic Stroke/TIA Tissue Perfusion: Goal: Complications of ischemic stroke/TIA will be minimized Outcome: Completed/Met

## 2021-05-09 NOTE — Progress Notes (Signed)
Inpatient Rehab Admissions Coordinator:  Received insurance authorization and have a bed available in CIR today. Dr. [facility] is aware and in agreement. Pt, NSG, and TOC made aware. NSG can call [facility] nursing station at [phone] after 12pm.   Gayland Curry, [place], [place] Admissions Coordinator [phone]

## 2021-05-09 NOTE — Progress Notes (Signed)
Signed                                                                                                                                                                                                                                                                                                                                                                                                              PMR Admission Coordinator Pre-Admission Assessment   Patient: Manuel Chapman is an 76 y.o., male MRN: [id number] DOB: 01/13/1946 Height: '5\' 7"'  (170.2 cm) Weight: 90.2 kg   Insurance Information HMO: Yes    PPO:       PCP:       IPA:       80/20:       OTHER: [employer] Plus PRIMARY: [employer]      Policy#: [id number]      Subscriber: Patient CM Name: Thora Lance      Phone#: [phone]     Fax#: [phone] Pre-Cert#: [id number] Received insurance authorization on 05/08/21 from [place].      Employer: Retired Benefits:  Phone #: [phone]     Name: [email or web address] online Eff. Date: 04/26/21     Deduct: $0      Out of Pocket Max: $3400      Life Max: N/A CIR: $295/day for days 1-6 (max $1770/admission)      SNF: $0 days 1-20; $196 days 21-100 Outpatient: med nec     Co-Pay: $20/visit Home Health: 100%      Co-Pay: none DME: 80%  Co-Pay: 20% Providers: in network  SECONDARY:       Policy#:      Phone#:    Development worker, community:       Phone#:    The Actuary for patients in Inpatient Rehabilitation Facilities with attached Privacy Act [place] Records was provided and verbally reviewed with: Patient   Emergency Contact Information Contact Information       Name Relation Home Work [place]) Daughter [phone]   [phone]    Delma, Villalva  [phone]   [phone]    Chong Sicilian Spouse                 Current Medical History  Patient Admitting Diagnosis: R PICA cerebellar infarction   History of Present Illness: A 76 y.o. male with a PMHx of prior ICH, gout, DM and HTN who presented overnight with a several day history of lightheadedness, dizziness with N/V. His dizziness had acutely worsened on Monday, which prompted him to call EMS. His N/V was worsened with movement. He denied any headache, fevers/chills, cough, diarrhea, head trauma or neck stiffness. He had severe HTN in the ED requiring antihypertensive medication.  CT head revealed a right cerebellar hypodensity most consistent with an ischemic stroke.  MRI brain was then obtained, confirming the presence of a subacute right cerebellar stroke.  Home medications include ASA and atorvastatin.  PT/OT/SLP evaluations completed with recommendations for acute inpatient rehab admission.   Complete NIHSS TOTAL: 2   Patient's medical record from [facility] has been reviewed by the rehabilitation admission coordinator and physician.   Past Medical History      Past Medical History:  Diagnosis Date   CVA (cerebral vascular accident) ([place]) 2020   Diabetes mellitus     Hypertension     ICH (intracerebral hemorrhage) ([place]) 11/2019      Has the patient had major surgery during 100 days prior to admission? No   Family History   family history is not on file.   Current Medications   Current Facility-Administered Medications:     stroke: mapping our early stages of recovery book, , Does not apply, Once, Karmen Bongo, MD   0.9 %  sodium chloride infusion, , Intravenous, Continuous, Karmen Bongo, MD, Last Rate: 50 mL/hr at 05/07/21 0225, New Bag at 05/07/21 0225   acetaminophen (TYLENOL) tablet 650 mg, 650 mg, Oral, Q4H PRN **OR** acetaminophen (TYLENOL) 160 MG/5ML solution 650 mg, 650 mg, Per Tube, Q4H PRN **OR** acetaminophen (TYLENOL) suppository 650 mg, 650 mg,  Rectal, Q4H PRN, Karmen Bongo, MD   allopurinol (ZYLOPRIM) tablet 300 mg, 300 mg, Oral, Daily, Karmen Bongo, MD, 300 mg at 05/07/21 2376   aspirin chewable tablet 81 mg, 81 mg, Oral, Daily, Karmen Bongo, MD, 81 mg at 05/07/21 0823   atorvastatin (LIPITOR) tablet 80 mg, 80 mg, Oral, Daily, Rosalin Hawking, MD, 80 mg at 05/07/21 2831   chlorthalidone (HYGROTON) tablet 25 mg, 25 mg, Oral, Daily, Karmen Bongo, MD, 25 mg at 05/07/21 5176   clopidogrel (PLAVIX) tablet 75 mg, 75 mg, Oral, Daily, Rosalin Hawking, MD, 75 mg at 05/07/21 1607   feeding supplement (ENSURE ENLIVE / ENSURE PLUS) liquid 237 mL, 237 mL, Oral, BID BM, [place]ger[place]y, Silver Huguenin, MD, 237 mL at 05/07/21 1240   heparin injection 5,000 Units, 5,000 Units, Subcutaneous, Q8H, Elgergawy, Silver Huguenin, MD, 5,000 Units at 05/07/21 1239   hydrALAZINE (APRESOLINE) injection 5 mg, 5 mg, Intravenous, Q4H PRN, Elgergawy, Silver Huguenin, MD, 5  mg at 05/07/21 0827   hydrALAZINE (APRESOLINE) tablet 25 mg, 25 mg, Oral, Q8H, Elgergawy, Dawood S, MD, 25 mg at 05/07/21 1239   insulin aspart (novoLOG) injection 0-15 Units, 0-15 Units, Subcutaneous, TID WC, Karmen Bongo, MD, 3 Units at 05/07/21 1239   insulin detemir (LEVEMIR) injection 10 Units, 10 Units, Subcutaneous, BID, Elgergawy, Silver Huguenin, MD, 10 Units at 05/07/21 0823   lisinopril (ZESTRIL) tablet 40 mg, 40 mg, Oral, Daily, Karmen Bongo, MD, 40 mg at 05/07/21 8832   metoprolol succinate (TOPROL-XL) 24 hr tablet 50 mg, 50 mg, Oral, Daily, Karmen Bongo, MD, 50 mg at 05/07/21 5498   multivitamin with minerals tablet 1 tablet, 1 tablet, Oral, Daily, Elgergawy, Silver Huguenin, MD, 1 tablet at 05/07/21 0823   ondansetron (ZOFRAN) injection 4 mg, 4 mg, Intravenous, Q6H PRN, Karmen Bongo, MD, 4 mg at 05/05/21 1428   senna-docusate (Senokot-S) tablet 1 tablet, 1 tablet, Oral, QHS PRN, Karmen Bongo, MD   sodium chloride flush (NS) 0.9 % injection 3 mL, 3 mL, Intravenous, Q12H, Karmen Bongo, MD, 3 mL at  05/07/21 0824   sodium chloride flush (NS) 0.9 % injection 3 mL, 3 mL, Intravenous, PRN, Karmen Bongo, MD   Patients Current Diet:  Diet Order                  Diet Carb Modified Fluid consistency: Thin; Room service appropriate? Yes with Assist  Diet effective now                         Precautions / Restrictions Precautions Precautions: Fall Precaution Comments: reports dizziness Restrictions Weight Bearing Restrictions: No    Has the patient had 2 or more falls or a fall with injury in the past year? Yes   Prior Activity Level Community (5-7x/wk): Went out daily.   Prior Functional Level Self Care: Did the patient need help bathing, dressing, using the toilet or eating? Needed some help   Indoor Mobility: Did the patient need assistance with walking from room to room (with or without device)? Independent   Stairs: Did the patient need assistance with internal or external stairs (with or without device)? Needed some help   Functional Cognition: Did the patient need help planning regular tasks such as shopping or remembering to take medications? Needed some help   Patient Information Are you of Hispanic, Latino/a,or Spanish origin?: A. No, not of Hispanic, Latino/a, or Spanish origin What is your race?: B. Black or African American Do you need or want an interpreter to communicate with a doctor or health care staff?: 0. No   Patient's Response To:  Health Literacy and Transportation Is the patient able to respond to health literacy and transportation needs?: Yes Health Literacy - How often do you need to have someone help you when you read instructions, pamphlets, or other written material from your doctor or pharmacy?: Rarely In the past 12 months, has lack of transportation kept you from medical appointments or from getting medications?: Yes In the past 12 months, has lack of transportation kept you from meetings, work, or from getting things needed for daily  living?: Yes   [place] / [place] Devices/Equipment: Blood pressure cuff Home Equipment: Cane - single point, Conservation officer, nature (2 wheels)   Prior Device Use: Indicate devices/aids used by the patient prior to current illness, exacerbation or injury? Walker   Current Functional Level Cognition   Arousal/Alertness: Awake/alert Overall Cognitive Status: Impaired/Different from baseline  Current Attention Level: Focused Orientation Level: Oriented to person, Oriented to place, Oriented to situation, Disoriented to time Following Commands: Follows multi-step commands consistently Safety/Judgement: Decreased awareness of safety, Decreased awareness of deficits General Comments: verbal cues to keep eyes open, pt difficulty to understand due to dysarthric speech, pt spoke of 2 daughters and caregiver but never spoke of wife who showed up to his room at end of therapy eval, wife does report she lives at a different house, pt not motivated and appears to live sedentary life. Pt with noted inconsistencies in report of support and home set up Attention: Selective Selective Attention: Impaired Selective Attention Impairment: Verbal basic Memory: Impaired Memory Impairment: Storage deficit Awareness: Impaired Awareness Impairment: Intellectual impairment Safety/Judgment: Impaired    Extremity Assessment (includes Sensation/Coordination)   Upper Extremity Assessment: Defer to OT evaluation RUE Deficits / Details: 3+/5 grosly LUE Deficits / Details: 3/5 grossly LUE Coordination: decreased fine motor, decreased gross motor  Lower Extremity Assessment: Generalized weakness     ADLs   Overall ADL's : Needs assistance/impaired Eating/Feeding: Set up, Supervision/ safety, Sitting Grooming: Wash/dry hands, Wash/dry face, Min guard, Sitting Upper Body Bathing: Moderate assistance Lower Body Bathing: Maximal assistance Upper Body Dressing : Moderate assistance Lower Body  Dressing: Maximal assistance Toilet Transfer: Moderate assistance, +2 for safety/equipment, Cueing for safety, Cueing for sequencing, Stand-pivot Toileting- Clothing Manipulation and Hygiene: Maximal assistance Functional mobility during ADLs: Moderate assistance, +2 for physical assistance, +2 for safety/equipment, Cueing for safety, Cueing for sequencing     Mobility   Overal bed mobility: Needs Assistance Bed Mobility: Supine to Sit Supine to sit: Mod assist, +2 for physical assistance General bed mobility comments: required encouragement to sit EOB, mod A to brig LEs to EOB/Off EOB and to elevate trunk     Transfers   Overall transfer level: Needs assistance Equipment used: 2 person hand held assist Transfers: Sit to/from Stand, Bed to chair/wheelchair/BSC Sit to Stand: Mod assist, +2 physical assistance, +2 safety/equipment Bed to/from chair/wheelchair/BSC transfer type:: Stand pivot Stand pivot transfers: Mod assist, +2 physical assistance, +2 safety/equipment General transfer comment: verbal and physical cues for sequencng and direction, pt with increased push through R side compared to L, reports dizziness with mobility but better than it was     Ambulation / Gait / Stairs / Wheelchair Mobility   Ambulation/Gait General Gait Details: unable this date     Posture / Balance Balance Overall balance assessment: Needs assistance Sitting-balance support: No upper extremity supported, Feet supported Sitting balance-Leahy Scale: Fair Standing balance support: Bilateral upper extremity supported, During functional activity Standing balance-Leahy Scale: Poor     Special needs/care consideration Continuous Drip IV  KVO; Diabetes - yes has a h/o DM    Previous Home Environment (from acute therapy documentation) Living Arrangements: Spouse/significant other (pt's wife arrived at end of session (they do not live together))  Lives With: Other (Comment) Coralyn Mark, 24 hour  caregiver) Available Help at Discharge: Available 24 hours/day Type of Home: [place]: One level Home Access: Stairs to enter Entrance Stairs-Rails: None Entrance Stairs-Number of Steps: 1 Bathroom Shower/Tub: Chiropodist: [place]: No   Discharge Living Setting Plans for Discharge Living Setting: House, Lives with (comment) Coralyn Mark, friend/caregiver, lives with patient.) Type of Home at Discharge: House Discharge Home Layout: One level Discharge Home Access: Stairs to enter Entrance Stairs-Rails: Left Entrance Stairs-Number of Steps: 2 Discharge Bathroom Shower/Tub: Tub/shower unit, Curtain Discharge Bathroom Toilet: Standard Discharge Bathroom Accessibility: Yes How Accessible:  Accessible via walker Does the patient have any problems obtaining your medications?: No   Social/Family/Support Systems Patient Roles: Spouse, Other (Comment) (Has ex-wife and a care giver.) Contact Information: Laureen Ochs - daughter Anticipated Caregiver: Coralyn Mark Ability/Limitations of Caregiver: Coralyn Mark lives with patient and is his caregiver.  Coralyn Mark works Software engineerwhen he wants to" per patient Caregiver Availability: 24/7 Discharge Plan Discussed with Primary Caregiver: Yes Is Caregiver In Agreement with Plan?: Yes Does Caregiver/Family have Issues with Lodging/Transportation while Pt is in Rehab?: No   Goals Patient/Family Goal for Rehab: PT/OT supervision to min assist goals Expected length of stay: 10-12 days Pt/Family Agrees to Admission and willing to participate: Yes Program Orientation Provided & Reviewed with Pt/Caregiver Including Roles  & Responsibilities: Yes   Decrease burden of Care through IP rehab admission: N/A   Possible need for SNF placement upon discharge: Not planned   Patient Condition: I have reviewed medical records from [facility], spoken with CM, and patient and family member. I met with patient at the bedside for  inpatient rehabilitation assessment.  Patient will benefit from ongoing PT, OT, and SLP, can actively participate in 3 hours of therapy a day 5 days of the week, and can make measurable gains during the admission.  Patient will also benefit from the coordinated team approach during an Inpatient Acute Rehabilitation admission.  The patient will receive intensive therapy as well as Rehabilitation physician, nursing, social worker, and care management interventions.  Due to bladder management, bowel management, safety, skin/wound care, disease management, medication administration, pain management, and patient education the patient requires 24 hour a day rehabilitation nursing.  The patient is currently Min A +2 with mobility and Mod-Max A with basic ADLs.  Discharge setting and therapy post discharge at home with outpatient is anticipated.  Patient has agreed to participate in the Acute Inpatient Rehabilitation Program and will admit today.   Preadmission Screen Completed By:  Retta Diones, with updates by Gayland Curry 05/07/2021 2:16 PM ______________________________________________________________________   Discussed status with Dr. Letta Pate on 05/09/21  at 10:00 AM and received approval for admission today.   Admission Coordinator:  Retta Diones, RN, with updates from Gayland Curry time 10:00 AM Sudie Grumbling 05/09/21     Assessment/Plan: Diagnosis:RIght cerebellar infarct Does the need for close, 24 hr/day Medical supervision in concert with the patient's rehab needs make it unreasonable for this patient to be served in a less intensive setting? Yes Co-Morbidities requiring supervision/potential complications: DM, Uncontrolled HTN Due to bladder management, bowel management, safety, skin/wound care, disease management, medication administration, pain management, and patient education, does the patient require 24 hr/day rehab nursing? Yes Does the patient require coordinated care of a  physician, rehab nurse, PT, OT, and SLP to address physical and functional deficits in the context of the above medical diagnosis(es)? Yes Addressing deficits in the following areas: balance, endurance, locomotion, strength, transferring, bowel/bladder control, bathing, dressing, feeding, grooming, toileting, cognition, and psychosocial support Can the patient actively participate in an intensive therapy program of at least 3 hrs of therapy 5 days a week? Yes The potential for patient to make measurable gains while on inpatient rehab is good Anticipated functional outcomes upon discharge from inpatient rehab: supervision PT, supervision OT, n/a SLP Estimated rehab length of stay to reach the above functional goals is: 10-12d Anticipated discharge destination: Home 10. Overall Rehab/Functional Prognosis: good     MD Signature: Charlett Blake M.D. [place] [employer] Fellow Am Acad of Phys Med  and Rehab Diplomate Am Board of Electrodiagnostic Med Fellow Am Board of Interventional Pain

## 2021-05-09 NOTE — Progress Notes (Signed)
INPATIENT REHABILITATION ADMISSION NOTE   Arrival Method: Wheelchair from 5W 1500     Mental Orientation: A/Ox3   Assessment: Complete    Skin: No issues    IV'S: Right forearm   Pain: None   Tubes and Drains: None   Safety Measures: Reviewed, Call bell within reach, bed in lowest position, bed alarm on.    Vital Signs: Complete   Height and Weight: Complete   Rehab Orientation: Reviewed  and patient has understanding.    Family: Notified by patient    Trey Sailors, LPN

## 2021-05-09 NOTE — Discharge Summary (Addendum)
Physician Discharge Summary  Manuel Chapman [id number] DOB: 1945/10/26 DOA: 05/05/2021  PCP: Lorene Dy, MD  Admit date: 05/05/2021 Discharge date: 05/09/2021  Admitted From: Home Disposition:  CIR  Recommendations for Outpatient Follow-up:  Patient will need 30 days event monitors, please arrange upon discharge from CIR Adjust diabetes medicine as needed, he is not on any bedtime insulin coverage currently as he had frequent episodes of low CBG before bedtime, insulin coverage can be initiated if it started to increase Patient will need referral to cardiology as an outpatient.   Discharge Condition:Stable CODE STATUS:FULL Diet recommendation: Heart Healthy / Carb Modified  Brief/Interim Summary:  Manuel Chapman is a 76 y.o. male with medical history significant of HTN, DM, and ICH (11/2019) presenting with dizziness.   He noticed 3 days ago that he "know I had to come, I kept putting it off."  He has bene vomiting, HTN, sweaty head, dizziness.  Symptoms have "changed" over that time but he can't really describe how.  No N/W/T of arms/legs.  Denies dysarthria.  No dysphagia.  +headache.    CT abnormal, MRI with CVA.  Neurology will consult.  Admitted for further work-up, neurology recommended discontinue permissive hypertension.   Acute CVA (cerebrovascular accident) ([place])- (present on admission) -Patient with prior CVA and ICH presenting with several days of stroke-like symptoms -CT with cerebellar infarct; MRI confirms and there is evidence of mass effect on the 4th ventricle without obstruction -Seen by neurology, currently on dual antiplatelet therapy aspirin and Plavix, commendation for aspirin 81 mg and Plavix 75 mg dual antiplatelet therapy for 3 weeks and then Plavix alone.  -Continue Lipitor 80 mg daily -2D echo with EF 45 to 50%, and grade 2 diastolic dysfunction, akinesis of left ventricle basal mid inferior lateral wall. -PT/OT/SLP consulted. -Patient  declined loop recorder, but okay with 30 days cardiac event monitor as an outpatient.   HTN -SBP goal per neurology is 120-140 -Resume home meds - Toprol XL, lisinopril, chlorthalidone, added low-dose hydralazine given overall is uncontrolled.  He is currently improved on current regimen after increasing his hydralazine to 50 mg 3 times daily.   HLD -LDL significantly elevated at 169, compliant with Lipitor at home, Lipitor was increased to 80 mg.   DM -prior indicated very poor control, >13, current A1C is 9.9. -home PO medications (actos, glucophage, Amaryl) hold on discharge, patient was kept on insulin sliding scale, and Levemir during hospital stay, he had a few episodes of hypoglycemia in the evening time, so his regimen was adjusted where he is currently on Levemir 5 units daily, and insulin sliding scale daytime, but no bedtime coverage, now his appetite has improved, so I would expect he will need a higher dose, so this should be adjusted as needed.  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      Discharge Diagnoses:  Principal Problem:   Acute CVA (cerebrovascular accident) [facility]) Active Problems:   Diabetes ([place])   HTN (hypertension)   Dyslipidemia    Discharge Instructions  Discharge Instructions     Ambulatory referral to Neurology   Complete by: As directed    Follow up with stroke clinic NP Venancio Poisson or  Cecille Rubin, if both not available, consider Zachery Dauer, or [place]) at [facility] in about 4 weeks. Thanks.   Diet - low sodium heart healthy   Complete by: As directed    Discharge instructions   Complete by: As directed     Increase activity slowly   Complete by: As directed       Allergies as of 05/09/2021   No Known Allergies      Medication List     STOP taking these medications    amLODipine 10 MG tablet Commonly known as: NORVASC   glimepiride 2 MG tablet Commonly known as: AMARYL   metFORMIN 1000 MG tablet Commonly known as: GLUCOPHAGE   pioglitazone 45 MG tablet Commonly known as: ACTOS   TYLENOL PM EXTRA STRENGTH PO       TAKE these medications    acetaminophen 325 MG tablet Commonly known as: TYLENOL Take 2 tablets (650 mg total) by mouth every 4 (four) hours as needed for mild pain (or temp > 37.5 C (99.5 F)).   allopurinol 300 MG tablet Commonly known as: ZYLOPRIM Take 300 mg by mouth daily.   aspirin 81 MG chewable tablet Chew 1 tablet (81 mg total) by mouth daily for 19 days. Please stop on 05/28/2021 What changed: additional instructions   atorvastatin 80 MG tablet Commonly known as: LIPITOR Take 1 tablet (80 mg total) by mouth daily. Start taking on: May 10, 2021 What changed:  medication strength how much to take   chlorthalidone 25 MG tablet Commonly known as: HYGROTON Take 25 mg by mouth daily.   clopidogrel 75 MG tablet Commonly known as: PLAVIX Take 1 tablet (75 mg total) by mouth daily. Start taking on: May 10, 2021   feeding supplement (GLUCERNA SHAKE) Liqd Take 237 mLs by mouth 3 (three) times daily between meals.   hydrALAZINE 50 MG tablet Commonly known as: APRESOLINE Take 1 tablet (50 mg total) by mouth every 8 (eight) hours.   insulin aspart 100 UNIT/ML injection Commonly known as: novoLOG Inject 0-15 Units into the skin 3 (three) times daily with meals.   insulin detemir 100 UNIT/ML injection Commonly known as: LEVEMIR Inject 0.05 mLs (5 Units total) into the skin daily. Start taking on: May 10, 2021 What changed:  how much to take when to take this   lisinopril 40 MG tablet Commonly known as: ZESTRIL Take 1 tablet  (40 mg total) by mouth daily.   metoprolol succinate 50 MG 24 hr tablet Commonly known as: TOPROL-XL Take 1 tablet (50 mg total) by mouth daily. Take with or immediately following a meal.   multivitamin with minerals Tabs tablet Take 1 tablet by mouth daily. Start taking on: May 10, 2021        Follow-up Information     [facility]. Schedule an appointment as soon as possible for a visit in 1 month(s).   Specialty: Neurology Why: stroke clinic Contact information: [place] [phone]               No Known Allergies  Consultations: Neurolgoy   Procedures/Studies: CT ANGIO HEAD NECK W WO CM  Result Date: 05/06/2021 CLINICAL DATA:  Dizziness, history of stroke EXAM: CT ANGIOGRAPHY HEAD AND NECK TECHNIQUE: Multidetector CT imaging of the head and neck was performed using the standard protocol during bolus administration of intravenous contrast. Multiplanar CT image reconstructions and MIPs were obtained to evaluate the vascular anatomy. Carotid stenosis measurements (when applicable) are  obtained utilizing NASCET criteria, using the distal internal carotid diameter as the denominator. RADIATION DOSE REDUCTION: This exam was performed according to the departmental dose-optimization program which includes automated exposure control, adjustment of the mA and/or kV according to patient size and/or use of iterative reconstruction technique. CONTRAST:  87mL OMNIPAQUE IOHEXOL 350 MG/ML SOLN COMPARISON:  05/05/2021 CT head, 05/06/2021 MRA, 11/28/2019 CTA head neck FINDINGS: CT HEAD FINDINGS Brain: Redemonstrated low-density in the right cerebellar hemisphere, consistent with history of infarct, largely unchanged compared to the prior exam. Mild mass effect on the right aspect of the lateral ventricle, unchanged. No acute hemorrhage, mass, or midline shift. No hydrocephalus or extra-axial collection.  Periventricular white matter changes, likely the sequela of chronic small vessel ischemic disease. Vascular: No hyperdense vessel. Skull: No acute osseous abnormality. Sinuses: Imaged portions are clear. Orbits: No acute finding. Review of the MIP images confirms the above findings CTA NECK FINDINGS Aortic arch: Two-vessel arch with a common origin of the brachiocephalic and left common carotid arteries. Imaged portion shows no evidence of aneurysm or dissection. No significant stenosis of the major arch vessel origins. Aortic atherosclerosis. Right carotid system: No evidence of dissection, stenosis (50% or greater) or occlusion. Left carotid system: No evidence of dissection, stenosis (50% or greater) or occlusion. Vertebral arteries: No evidence of dissection, stenosis (50% or greater) or occlusion. Skeleton: No acute osseous abnormality. Other neck: Negative. Upper chest: Negative. Review of the MIP images confirms the above findings CTA HEAD FINDINGS Anterior circulation: Both internal carotid arteries are patent to the termini, with moderate calcified and noncalcified narrowing in the right carotid siphon and non hemodynamically significant calcifications in the left intracranial ICA. A1 segments patent. Normal anterior communicating artery. Anterior cerebral arteries are patent to their distal aspects. No M1 stenosis or occlusion. Normal MCA bifurcations. Distal MCA branches perfused and symmetric. Posterior circulation: Vertebral arteries patent to the vertebrobasilar junction without stenosis. Posterior inferior cerebral arteries patent bilaterally. Basilar mildly irregular but patent to its distal aspect. Superior cerebellar arteries patent bilaterally. Diminutive left P1 with patent posterior communicating artery, with near fetal origin of the left PCA. No right posterior communicating artery is seen. Normal origin of the right PCA. PCAs perfused to their distal aspects without stenosis. Venous sinuses:  As permitted by contrast timing, patent. Anatomic variants: None significant Review of the MIP images confirms the above findings IMPRESSION: 1. Moderate narrowing of the intracranial right internal carotid artery. No large vessel occlusion or other hemodynamically significant stenosis. 2.  No hemodynamically significant stenosis in the neck. 3. Redemonstrated low-density in the right cerebellum, consistent with infarct. No additional acute intracranial process. Electronically Signed   By: Merilyn Baba M.D.   On: 05/06/2021 15:57   CT HEAD WO CONTRAST (5MM)  Result Date: 05/05/2021 CLINICAL DATA:  Follow-up stroke EXAM: CT HEAD WITHOUT CONTRAST TECHNIQUE: Contiguous axial images were obtained from the base of the skull through the vertex without intravenous contrast. RADIATION DOSE REDUCTION: This exam was performed according to the departmental dose-optimization program which includes automated exposure control, adjustment of the mA and/or kV according to patient size and/or use of iterative reconstruction technique. COMPARISON:  CT and MRI 05/05/2021 FINDINGS: Brain: Large low-density in the right cerebellum consistent with history of infarct. No hemorrhage identified. Similar degree of mass effect on fourth ventricle without evidence for obstruction. Atrophy and advanced chronic small vessel ischemic changes of the white matter. Stable ventricular size. Vascular: No hyperdense vessels.  Carotid vascular calcification Skull: Normal. Negative for fracture  or focal lesion. Sinuses/Orbits: No acute finding. Other: None IMPRESSION: 1. Similar size and appearance of large low-density region in the right cerebellum consistent with history of infarct. No hemorrhage identified. Similar degree of mass effect on fourth ventricle without evidence for obstruction. 2. Atrophy and advanced chronic small vessel ischemic changes of the white matter. Electronically Signed   By: Donavan Foil M.D.   On: 05/05/2021 23:32   CT  HEAD WO CONTRAST (5MM)  Result Date: 05/05/2021 CLINICAL DATA:  Dizziness, persistent or recurrent with cardiac or vascular cause suspected EXAM: CT HEAD WITHOUT CONTRAST TECHNIQUE: Contiguous axial images were obtained from the base of the skull through the vertex without intravenous contrast. COMPARISON:  Brain MRI 11/29/2019 FINDINGS: Brain: Large low-density in the right cerebellum which reaches the surface, more consistent with acute infarct than vasogenic edema. Most of the hemisphere is involved. The fourth ventricle remains patent. There is lateral ventriculomegaly that is chronic with callosal angle narrowing but no convincing normal pressure hydrocephalus. Chronic small vessel ischemia in the hemispheric white matter and also in the pons by MRI. Vascular: No hyperdense vessel or unexpected calcification. Skull: Normal. Negative for fracture or focal lesion. Sinuses/Orbits: No acute finding. IMPRESSION: 1. Large low-density in the right cerebellum, favor recent infarct over vasogenic edema. Recommend MR characterization. 2. Patent fourth ventricle.  No obstructive hydrocephalus. 3. Advanced chronic small vessel disease. Electronically Signed   By: Jorje Guild M.D.   On: 05/05/2021 06:25   MR ANGIO HEAD WO CONTRAST  Result Date: 05/06/2021 CLINICAL DATA:  Brain MRI and CT from yesterday EXAM: MRA HEAD WITHOUT CONTRAST TECHNIQUE: Angiographic images of the Circle of Willis were acquired using MRA technique without intravenous contrast. COMPARISON:  No pertinent prior exam. FINDINGS: Anterior circulation: Atheromatous irregularity of the carotid siphons, especially on the right where there is 30% narrowing. Atheromatous irregularity of intracranial branches bilaterally. Downward pointing outpouching at the right supraclinoid carotid measuring up to 2 mm in size. Posterior circulation: The vertebral arteries are not covered. The covered basilar artery is widely patent. Minimal coverage of the right  PICA vessel which is flowing where seen. Fetal type left PCA flow. No branch occlusion or visible aneurysm Anatomic variants: As above Other: None. IMPRESSION: 1. Non covered vertebral arteries and PICA branches. The covered posterior circulation vessels are widely patent. 2. Siphon atherosclerosis especially affecting the right cavernous carotid with mild stenosis. 3. 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 Electronically Signed   By: Jorje Guild M.D.   On: 05/06/2021 05:38   MR Brain W and Wo Contrast  Result Date: 05/05/2021 CLINICAL DATA:  Neuro deficit, acute, stroke suspected. Diabetes and hypertension. Abnormal head CT. EXAM: MRI HEAD WITHOUT AND WITH CONTRAST TECHNIQUE: Multiplanar, multiecho pulse sequences of the brain and surrounding structures were obtained without and with intravenous contrast. CONTRAST:  78mL GADAVIST GADOBUTROL 1 MMOL/ML IV SOLN COMPARISON:  Head CT same day.  MRI 11/29/2019. FINDINGS: Brain: Extensive region of acute infarction within the inferior cerebellum on the right, probably PICA distribution. Swelling and edema suggest that this is early subacute. Very minimal petechial blood products present without hematoma. No other acute infarction. Chronic small-vessel ischemic changes are seen throughout the pons. Confluent chronic small vessel ischemic changes are present throughout the cerebral hemispheric white matter. Old hemorrhagic infarction in the left posterior caudate body is evident. Chronic ventriculomegaly secondary to central atrophy. No extra-axial collection. Vascular: Major vessels at the base of the brain show flow. Skull and upper  cervical spine: Negative Sinuses/Orbits: Clear/normal Other: None IMPRESSION: Large region of acute/early subacute infarction affecting the inferior cerebellum on the right consistent with right PICA territory infarction. Petechial blood products but no frank hematoma. Mass-effect upon the fourth ventricle without  evidence of obstruction at this time. Chronic ventriculomegaly felt secondary to central atrophy. Extensive chronic small-vessel ischemic changes affecting the pons and cerebral hemispheric white matter. 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 Electronically Signed   By: Nelson Chimes M.D.   On: 05/05/2021 09:16   DG Chest Portable 1 View  Result Date: 05/05/2021 CLINICAL DATA:  Dizziness. EXAM: PORTABLE CHEST 1 VIEW COMPARISON:  Chest radiograph dated 11/28/2019. FINDINGS: Cardiomegaly with vascular congestion. No focal consolidation, pleural effusion or pneumothorax. Atherosclerotic calcification of the aorta. Degenerative changes of spine. No acute osseous pathology. IMPRESSION: Cardiomegaly with vascular congestion. No focal consolidation. Electronically Signed   By: Anner Crete M.D.   On: 05/05/2021 02:56   ECHOCARDIOGRAM COMPLETE  Result Date: 05/05/2021    ECHOCARDIOGRAM REPORT   Patient Name:   Manuel Chapman Date of Exam: 05/05/2021 Medical Rec #:  [id number]             Height:       66.0 in Accession #:    [id number]            Weight:       226.2 lb Date of Birth:  18-Oct-1945             BSA:          2.107 m Patient Age:    34 years              BP:           177/93 mmHg Patient Gender: M                     HR:           76 bpm. Exam Location:  Inpatient Procedure: 2D Echo, Cardiac Doppler, Color Doppler and Intracardiac            Opacification Agent Indications:    Stroke  History:        Patient has prior history of Echocardiogram examinations, most                 recent 11/29/2019. Aortic Valve Disease; Risk Factors:Diabetes,                 Hypertension and Dyslipidemia.  Sonographer:    Glo Herring Referring Phys: Bells  1. Left ventricular ejection fraction, by estimation, is 45 to 50%. The left ventricle has mildly decreased function. The left ventricle demonstrates regional wall motion abnormalities (see scoring  diagram/findings for description). There is moderate concentric left ventricular hypertrophy. Left ventricular diastolic parameters are consistent with Grade II diastolic dysfunction (pseudonormalization). Elevated left atrial pressure. There is akinesis of the left ventricular, basal-mid inferolateral wall.  2. Right ventricular systolic function is mildly reduced. The right ventricular size is mildly enlarged. Tricuspid regurgitation signal is inadequate for assessing PA pressure.  3. Left atrial size was mildly dilated.  4. Right atrial size was mildly dilated.  5. The mitral valve is normal in structure. Mild mitral valve regurgitation.  6. The aortic valve is tricuspid. There is severe calcifcation of the aortic valve. There is severe thickening of the aortic valve. Aortic valve regurgitation is trivial. Severe aortic valve stenosis. Aortic valve mean gradient measures 30.9  mmHg. Aortic valve Vmax measures 4.04 m/s. Comparison(s): Prior images reviewed side by side. The left ventricular function is worsened. The right ventricular systolic function is worse. Aortic stenosis has worsened and is now severe. FINDINGS  Left Ventricle: Left ventricular ejection fraction, by estimation, is 45 to 50%. The left ventricle has mildly decreased function. The left ventricle demonstrates regional wall motion abnormalities. The left ventricular internal cavity size was normal in size. There is moderate concentric left ventricular hypertrophy. Left ventricular diastolic parameters are consistent with Grade II diastolic dysfunction (pseudonormalization). Elevated left atrial pressure.  LV Wall Scoring: The posterior wall is akinetic. Right Ventricle: The right ventricular size is mildly enlarged. Right vetricular wall thickness was not well visualized. Right ventricular systolic function is mildly reduced. Tricuspid regurgitation signal is inadequate for assessing PA pressure. Left Atrium: Left atrial size was mildly dilated.  Right Atrium: Right atrial size was mildly dilated. Pericardium: There is no evidence of pericardial effusion. Mitral Valve: The mitral valve is normal in structure. Mild mitral valve regurgitation, with centrally-directed jet. Tricuspid Valve: The tricuspid valve is normal in structure. Tricuspid valve regurgitation is not demonstrated. Aortic Valve: The aortic valve is tricuspid. There is severe calcifcation of the aortic valve. There is severe thickening of the aortic valve. Aortic valve regurgitation is trivial. Severe aortic stenosis is present. Aortic valve mean gradient measures 30.9 mmHg. Aortic valve peak gradient measures 65.2 mmHg. Aortic valve area, by VTI measures 0.58 cm. Pulmonic Valve: The pulmonic valve was grossly normal. Pulmonic valve regurgitation is not visualized. Aorta: The aortic root and ascending aorta are structurally normal, with no evidence of dilitation. IAS/Shunts: The interatrial septum was not well visualized.  LEFT VENTRICLE PLAX 2D LVIDd:         5.00 cm   Diastology LVIDs:         3.80 cm   LV e' medial:    4.00 cm/s LV PW:         1.10 cm   LV E/e' medial:  28.3 LV IVS:        1.60 cm   LV e' lateral:   4.87 cm/s LVOT diam:     1.90 cm   LV E/e' lateral: 23.2 LV SV:         58 LV SV Index:   27 LVOT Area:     2.84 cm  RIGHT VENTRICLE            IVC RV S prime:     7.31 cm/s  IVC diam: 2.40 cm LEFT ATRIUM         Index LA diam:    4.20 cm 1.99 cm/m  AORTIC VALVE AV Area (Vmax):    0.57 cm AV Area (Vmean):   0.64 cm AV Area (VTI):     0.58 cm AV Vmax:           403.81 cm/s AV Vmean:          244.780 cm/s AV VTI:            0.991 m AV Peak Grad:      65.2 mmHg AV Mean Grad:      30.9 mmHg LVOT Vmax:         80.93 cm/s LVOT Vmean:        55.267 cm/s LVOT VTI:          0.204 m LVOT/AV VTI ratio: 0.21  AORTA Ao Root diam: 3.10 cm Ao Asc diam:  2.85 cm MITRAL VALVE MV Area (  PHT): 4.36 cm     SHUNTS MV Decel Time: 174 msec     Systemic VTI:  0.20 m MV E velocity: 113.00 cm/s   Systemic Diam: 1.90 cm MV A velocity: 125.00 cm/s MV E/A ratio:  0.90 Mihai Croitoru MD Electronically signed by Sanda Klein MD Signature Date/Time: 05/05/2021/5:18:53 PM    Final    VAS US CAROTID (at [facility] and [facility] only)  Result Date: 05/06/2021 Carotid Arterial Duplex Study Patient Name:  Manuel Chapman  Date of Exam:   05/05/2021 Medical Rec #: [id number]              Accession #:    [id number] Date of Birth: Oct 28, 1945              Patient Gender: M Patient Age:   80 years Exam Location:  [facility] Procedure:      VAS US CAROTID Referring Phys: Karmen Bongo --------------------------------------------------------------------------------  Indications:  CVA. Risk Factors: Hypertension, hyperlipidemia, Diabetes, past history of smoking. Performing Technologist: Darlin Coco RDMS, RVT  Examination Guidelines: A complete evaluation includes B-mode imaging, spectral Doppler, color Doppler, and power Doppler as needed of all accessible portions of each vessel. Bilateral testing is considered an integral part of a complete examination. Limited examinations for reoccurring indications may be performed as noted.  Right Carotid Findings: +----------+--------+--------+--------+---------------------+------------------+             PSV cm/s EDV cm/s Stenosis Plaque Description    Comments            +----------+--------+--------+--------+---------------------+------------------+  CCA Prox   60       9                                                           +----------+--------+--------+--------+---------------------+------------------+  CCA Distal 50       7                                       intimal thickening  +----------+--------+--------+--------+---------------------+------------------+  ICA Prox   26       8        1-39%    hyperechoic and focal                     +----------+--------+--------+--------+---------------------+------------------+  ICA Distal 40       12                                                           +----------+--------+--------+--------+---------------------+------------------+  ECA        41                                                                   +----------+--------+--------+--------+---------------------+------------------+ +----------+--------+-------+----------------+-------------------+             PSV  cm/s EDV cms Describe         Arm Pressure (mmHG)  +----------+--------+-------+----------------+-------------------+  Subclavian 64               Multiphasic, WNL                      +----------+--------+-------+----------------+-------------------+ +---------+--------+--+--------+-+---------+  Vertebral PSV cm/s 27 EDV cm/s 6 Antegrade  +---------+--------+--+--------+-+---------+  Left Carotid Findings: +----------+--------+--------+--------+------------------+--------+             PSV cm/s EDV cm/s Stenosis Plaque Description Comments  +----------+--------+--------+--------+------------------+--------+  CCA Prox   50       7                                              +----------+--------+--------+--------+------------------+--------+  CCA Distal 54       11                                             +----------+--------+--------+--------+------------------+--------+  ICA Prox   46       12                heterogenous mild            +----------+--------+--------+--------+------------------+--------+  ICA Distal 66       16                                             +----------+--------+--------+--------+------------------+--------+  ECA        51                                                      +----------+--------+--------+--------+------------------+--------+ +----------+--------+--------+----------------+-------------------+             PSV cm/s EDV cm/s Describe         Arm Pressure (mmHG)  +----------+--------+--------+----------------+-------------------+  Subclavian 83                Multiphasic, WNL                       +----------+--------+--------+----------------+-------------------+ +---------+--------+--+--------+-+---------+  Vertebral PSV cm/s 33 EDV cm/s 8 Antegrade  +---------+--------+--+--------+-+---------+   Summary: Right Carotid: Velocities in the right ICA are consistent with a 1-39% stenosis. Left Carotid: The extracranial vessels were near-normal with only minimal wall               thickening or plaque. Vertebrals:  Bilateral vertebral arteries demonstrate antegrade flow. Subclavians: Normal flow hemodynamics were seen in bilateral subclavian              arteries. *See table(s) above for measurements and observations.  Electronically signed by Antony Contras MD on 05/06/2021 at 8:17:42 AM.    Final       Subjective: Patient denies any complaints today, patient with good appetite, finished 90% of his breakfast.  Discharge Exam: Vitals:   05/09/21 0752 05/09/21 0929  BP: (!) 142/77   Pulse: 75 81  Resp:  20   Temp: 98.5 F (36.9 C)   SpO2: 95%    Vitals:   05/09/21 0400 05/09/21 0446 05/09/21 0752 05/09/21 0929  BP: (!) 183/85 (!) 169/77 (!) 142/77   Pulse: 70 74 75 81  Resp: 20 18 20    Temp: 98.5 F (36.9 C)  98.5 F (36.9 C)   TempSrc: Oral  Oral   SpO2: 95% 97% 95%   Weight:      Height:        General: Pt is alert, awake, not in acute distress, his speech has improved. Cardiovascular: RRR, S1/S2 +, no rubs, no gallops Respiratory: CTA bilaterally, no wheezing, no rhonchi Abdominal: Soft, NT, ND, bowel sounds + Extremities: no edema, no cyanosis    The results of significant diagnostics from this hospitalization (including imaging, microbiology, ancillary and laboratory) are listed below for reference.     Microbiology: Recent Results (from the past 240 hour(s))  Urine Culture     Status: Abnormal   Collection Time: 05/05/21  2:02 AM   Specimen: Urine, Clean Catch  Result Value Ref Range Status   Specimen Description URINE, CLEAN CATCH  Final   Special Requests    Final    NONE Performed at [facility], [place] [address] [place], [place] [address]    Culture MULTIPLE SPECIES PRESENT, SUGGEST RECOLLECTION (A)  Final   Report Status 05/06/2021 FINAL  Final  Resp Panel by RT-PCR (Flu A&B, Covid) Nasopharyngeal Swab     Status: None   Collection Time: 05/05/21  2:04 AM   Specimen: Nasopharyngeal Swab; Nasopharyngeal(NP) swabs in vial transport medium  Result Value Ref Range Status   SARS Coronavirus 2 by RT PCR NEGATIVE NEGATIVE Final    Comment: (NOTE) SARS-CoV-2 target nucleic acids are NOT DETECTED.  The SARS-CoV-2 RNA is generally detectable in upper respiratory specimens during the acute phase of infection. The lowest concentration of SARS-CoV-2 viral copies this assay can detect is 138 copies/mL. A negative result does not preclude SARS-Cov-2 infection and should not be used as the sole basis for treatment or other patient management decisions. A negative result may occur with  improper specimen collection/handling, submission of specimen other than nasopharyngeal swab, presence of viral mutation(s) within the areas targeted by this assay, and inadequate number of viral copies(<138 copies/mL). A negative result must be combined with clinical observations, patient history, and epidemiological information. The expected result is Negative.  Fact Sheet for Patients:  EntrepreneurPulse.com.au  Fact Sheet for Healthcare Providers:  IncredibleEmployment.be  This test is no t yet approved or cleared by the [place] FDA and  has been authorized for detection and/or diagnosis of SARS-CoV-2 by FDA under an Emergency Use Authorization (EUA). This EUA will remain  in effect (meaning this test can be used) for the duration of the COVID-19 declaration under Section 564(b)(1) of the Act, 21 U.S.C.section 360bbb-3(b)(1), unless the authorization is terminated  or revoked sooner.       Influenza A by PCR  NEGATIVE NEGATIVE Final   Influenza B by PCR NEGATIVE NEGATIVE Final    Comment: (NOTE) The Xpert Xpress SARS-CoV-2/FLU/RSV plus assay is intended as an aid in the diagnosis of influenza from Nasopharyngeal swab specimens and should not be used as a sole basis for treatment. Nasal washings and aspirates are unacceptable for Xpert Xpress SARS-CoV-2/FLU/RSV testing.  Fact Sheet for Patients: EntrepreneurPulse.com.au  Fact Sheet for Healthcare Providers: IncredibleEmployment.be  This test is not yet approved or cleared by the [place] FDA  and has been authorized for detection and/or diagnosis of SARS-CoV-2 by FDA under an Emergency Use Authorization (EUA). This EUA will remain in effect (meaning this test can be used) for the duration of the COVID-19 declaration under Section 564(b)(1) of the Act, 21 U.S.C. section 360bbb-3(b)(1), unless the authorization is terminated or revoked.  Performed at [facility], [place] [address], [place], [place] [address]      Labs: BNP (last 3 results) No results for input(s): BNP in the last 8760 hours. Basic Metabolic Panel: Recent Labs  Lab 05/05/21 0226 05/05/21 0538 05/07/21 0133 05/09/21 0959  NA 136 134* 135 135  K 3.7 4.0 4.0 3.7  CL  --  101 99 99  CO2  --  22 25 26   GLUCOSE  --  300* 215* 259*  BUN  --  12 21 16   CREATININE  --  0.79 1.20 1.13  CALCIUM  --  7.6* 7.9* 8.0*  MG  --   --  1.8  --    Liver Function Tests: Recent Labs  Lab 05/05/21 0538  AST 28  ALT 10  ALKPHOS 98  BILITOT 1.4*  PROT 5.7*  ALBUMIN 2.4*   No results for input(s): LIPASE, AMYLASE in the last 168 hours. No results for input(s): AMMONIA in the last 168 hours. CBC: Recent Labs  Lab 05/05/21 0210 05/05/21 0226 05/07/21 0133 05/09/21 0959  WBC 11.4*  --  7.9 9.5  NEUTROABS 8.6*  --   --   --   HGB 10.5* 12.6* 10.7* 11.1*  HCT 31.5* 37.0* 33.5* 34.9*  MCV 89.2  --  88.9 88.6  PLT 282  --  265  321   Cardiac Enzymes: No results for input(s): CKTOTAL, CKMB, CKMBINDEX, TROPONINI in the last 168 hours. BNP: Invalid input(s): POCBNP CBG: Recent Labs  Lab 05/08/21 1154 05/08/21 1556 05/08/21 1721 05/08/21 1959 05/09/21 0751  GLUCAP 121* 51* 117* 228* 193*   D-Dimer No results for input(s): DDIMER in the last 72 hours. Hgb A1c No results for input(s): HGBA1C in the last 72 hours. Lipid Profile No results for input(s): CHOL, HDL, LDLCALC, TRIG, CHOLHDL, LDLDIRECT in the last 72 hours. Thyroid function studies No results for input(s): TSH, T4TOTAL, T3FREE, THYROIDAB in the last 72 hours.  Invalid input(s): FREET3 Anemia work up No results for input(s): VITAMINB12, FOLATE, FERRITIN, TIBC, IRON, RETICCTPCT in the last 72 hours. Urinalysis    Component Value Date/Time   COLORURINE YELLOW 05/08/2021 [place] 05/08/2021 1633   LABSPEC 1.015 05/08/2021 1633   PHURINE 7.0 05/08/2021 1633   GLUCOSEU NEGATIVE 05/08/2021 1633   HGBUR TRACE (A) 05/08/2021 1633   BILIRUBINUR NEGATIVE 05/08/2021 1633   KETONESUR NEGATIVE 05/08/2021 1633   PROTEINUR 100 (A) 05/08/2021 1633   UROBILINOGEN 0.2 05/03/2011 1754   NITRITE NEGATIVE 05/08/2021 1633   LEUKOCYTESUR TRACE (A) 05/08/2021 1633   Sepsis Labs Invalid input(s): PROCALCITONIN,  WBC,  LACTICIDVEN Microbiology Recent Results (from the past 240 hour(s))  Urine Culture     Status: Abnormal   Collection Time: 05/05/21  2:02 AM   Specimen: Urine, Clean Catch  Result Value Ref Range Status   Specimen Description URINE, CLEAN CATCH  Final   Special Requests   Final    NONE Performed at [facility], [place] [address], [place],  [address]    Culture MULTIPLE SPECIES PRESENT, SUGGEST RECOLLECTION (A)  Final   Report Status 05/06/2021 FINAL  Final  Resp Panel by RT-PCR (  Flu A&B, Covid) Nasopharyngeal Swab     Status: None   Collection Time: 05/05/21  2:04 AM   Specimen: Nasopharyngeal Swab;  Nasopharyngeal(NP) swabs in vial transport medium  Result Value Ref Range Status   SARS Coronavirus 2 by RT PCR NEGATIVE NEGATIVE Final    Comment: (NOTE) SARS-CoV-2 target nucleic acids are NOT DETECTED.  The SARS-CoV-2 RNA is generally detectable in upper respiratory specimens during the acute phase of infection. The lowest concentration of SARS-CoV-2 viral copies this assay can detect is 138 copies/mL. A negative result does not preclude SARS-Cov-2 infection and should not be used as the sole basis for treatment or other patient management decisions. A negative result may occur with  improper specimen collection/handling, submission of specimen other than nasopharyngeal swab, presence of viral mutation(s) within the areas targeted by this assay, and inadequate number of viral copies(<138 copies/mL). A negative result must be combined with clinical observations, patient history, and epidemiological information. The expected result is Negative.  Fact Sheet for Patients:  EntrepreneurPulse.com.au  Fact Sheet for Healthcare Providers:  IncredibleEmployment.be  This test is no t yet approved or cleared by the [place] FDA and  has been authorized for detection and/or diagnosis of SARS-CoV-2 by FDA under an Emergency Use Authorization (EUA). This EUA will remain  in effect (meaning this test can be used) for the duration of the COVID-19 declaration under Section 564(b)(1) of the Act, 21 U.S.C.section 360bbb-3(b)(1), unless the authorization is terminated  or revoked sooner.       Influenza A by PCR NEGATIVE NEGATIVE Final   Influenza B by PCR NEGATIVE NEGATIVE Final    Comment: (NOTE) The Xpert Xpress SARS-CoV-2/FLU/RSV plus assay is intended as an aid in the diagnosis of influenza from Nasopharyngeal swab specimens and should not be used as a sole basis for treatment. Nasal washings and aspirates are unacceptable for Xpert Xpress  SARS-CoV-2/FLU/RSV testing.  Fact Sheet for Patients: EntrepreneurPulse.com.au  Fact Sheet for Healthcare Providers: IncredibleEmployment.be  This test is not yet approved or cleared by the [place] FDA and has been authorized for detection and/or diagnosis of SARS-CoV-2 by FDA under an Emergency Use Authorization (EUA). This EUA will remain in effect (meaning this test can be used) for the duration of the COVID-19 declaration under Section 564(b)(1) of the Act, 21 U.S.C. section 360bbb-3(b)(1), unless the authorization is terminated or revoked.  Performed at [facility], [place] [address] [place], [place] [address]      Time coordinating discharge: Over 30 minutes  SIGNED:   Phillips Climes, MD  [facility] 05/09/2021, 11:10 AM Pager   If 7PM-7AM, please contact night-coverage [email or web address] Password [email or web address]

## 2021-05-09 NOTE — H&P (Signed)
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  Per chart review patient currently lives with a friend.  He is married but currently separated.  Patient does have good family support.  Patient was able to feed himself dress groom and toilet himself but does have a sedentary lifestyle.  Caregiver/friend does assist with some meal preparation and medication management.  Presented 05/05/2021 with dizziness and vomiting x3 days.  CT/MRI showed large region of acute/early subacute infarct affecting the inferior cerebellum on the right consistent with right PICA territory infarction.  Petechial blood products but no frank hematoma.  Mass-effect upon the fourth ventricle without evidence of obstruction.  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  No large vessel occlusion or hemodynamically significant stenosis.  Admission chemistry unremarkable except glucose 300, hemoglobin 10.5, WBC 11,400, urine drug screen negative, hemoglobin A1c 9.9.  Echocardiogram with ejection fraction of 45 to [id number] grade 2 diastolic dysfunction.  Neurology follow-up maintained on aspirin and Plavix DAPT for 3 weeks then Plavix alone.  Patient declined loop recorder but plan 30-day cardiac event monitor as outpatient.  Subcutaneous heparin for DVT prophylaxis.  Tolerating a regular consistency diet.  Therapy evaluations completed due to patient decreased functional mobility was admitted for a comprehensive rehab program.   Review of Systems  Constitutional:  Negative for chills and  fever.  HENT:  Negative for hearing loss.   Eyes:  Negative for blurred vision and double vision.  Respiratory:  Negative for cough and shortness of breath.   Cardiovascular:  Negative for chest pain, palpitations and leg swelling.  Gastrointestinal:  Positive for constipation and nausea. Negative for vomiting.  Genitourinary:  Negative for dysuria, flank pain and hematuria.  Musculoskeletal:  Positive for joint pain and myalgias.  Skin:  Negative for rash.  Neurological:  Positive for dizziness, weakness and headaches.  All other systems reviewed and are negative.     Past Medical History:  Diagnosis Date   CVA (cerebral vascular accident) ([place]) 2020   Diabetes mellitus     Hypertension     ICH (intracerebral hemorrhage) ([place]) 11/2019    History reviewed. No pertinent surgical history.      Family History  Problem Relation Age of Onset   Diabetes Neg Hx     Stroke Neg Hx      Social History:  reports that he quit smoking about 25 years ago. His smoking use included cigarettes. He has a 10.00 pack-year smoking history. He has never used smokeless tobacco. He reports current alcohol use. He reports that he does not use drugs. Allergies: No Known Allergies       Medications Prior to Admission  Medication Sig Dispense Refill   allopurinol (ZYLOPRIM) 300 MG tablet Take 300 mg by mouth daily.       atorvastatin (LIPITOR) 40 MG tablet Take 40 mg by mouth daily.       chlorthalidone (HYGROTON) 25 MG tablet Take 25 mg by mouth daily.       diphenhydrAMINE-APAP, sleep, (TYLENOL PM EXTRA STRENGTH PO) Take 1 tablet  by mouth at bedtime as needed (sleep).       glimepiride (AMARYL) 2 MG tablet Take 2 mg by mouth daily.       lisinopril (ZESTRIL) 40 MG tablet Take 1 tablet (40 mg total) by mouth daily. 30 tablet 1   metFORMIN (GLUCOPHAGE) 1000 MG tablet Take 1,000 mg by mouth daily with breakfast.       metoprolol succinate (TOPROL-XL) 50 MG 24 hr tablet Take 1 tablet (50 mg total) by mouth  daily. Take with or immediately following a meal. 30 tablet 1   pioglitazone (ACTOS) 45 MG tablet Take 45 mg by mouth daily.       acetaminophen (TYLENOL) 325 MG tablet Take 2 tablets (650 mg total) by mouth every 4 (four) hours as needed for mild pain (or temp > 37.5 C (99.5 F)). (Patient not taking: Reported on 05/05/2021) 180 tablet 0   amLODipine (NORVASC) 10 MG tablet Take 1 tablet (10 mg total) by mouth daily. (Patient not taking: Reported on 05/05/2021) 30 tablet 1   aspirin 81 MG chewable tablet Chew 1 tablet (81 mg total) by mouth daily. (Patient not taking: Reported on 05/05/2021) 30 tablet 1   insulin detemir (LEVEMIR) 100 UNIT/ML injection Inject 0.15 mLs (15 Units total) into the skin 2 (two) times daily. (Patient not taking: Reported on 05/05/2021) 10 mL 11      Drug Regimen Review Drug regimen was reviewed and remains appropriate with no significant issues identified   Home: Home Living Family/patient expects to be discharged to:: Private residence Living Arrangements: Spouse/significant other (pt's wife arrived at end of session (they do not live together)) Available Help at Discharge: Available 24 hours/day Type of Home: House Home Access: Stairs to enter [employer] of Steps: 1 Entrance Stairs-Rails: None Home Layout: One level Bathroom Shower/Tub: Chiropodist: Standard Home Equipment: Kasandra Knudsen - single point, Conservation officer, nature (2 wheels)  Lives With: Other (Comment) Coralyn Mark, 24 hour caregiver)   Functional History: Prior Function Prior Level of Function : Needs assist Physical Assist : ADLs (physical) ADLs (physical): Bathing, IADLs Mobility Comments: pt used RW for mobility ADLs Comments: Pt's wife reports that pt's caregiver helped him with bathing, meal prep and medication mgt; pt was able to feed, dress, groom and toilet himself   Functional Status:  Mobility: Bed Mobility Overal bed mobility: Needs Assistance Bed Mobility: Rolling  (scooting up the bed) Rolling: Min assist Sidelying to sit: Min assist Supine to sit: Mod assist, +2 for physical assistance General bed mobility comments: mod assist to scoot up the bed with cues which pt can follow with extra time Transfers Overall transfer level: Needs assistance Equipment used: 2 person hand held assist, Rolling walker (2 wheels) Transfers: Sit to/from Stand, Bed to chair/wheelchair/BSC Sit to Stand: +2 physical assistance, +2 safety/equipment, Min assist Bed to/from chair/wheelchair/BSC transfer type:: Stand pivot Stand pivot transfers: +2 physical assistance, +2 safety/equipment, Min assist General transfer comment: declines Ambulation/Gait General Gait Details: pivotal steps only   ADL: ADL Overall ADL's : Needs assistance/impaired Eating/Feeding: Set up, Supervision/ safety, Sitting Grooming: Wash/dry hands, Wash/dry face, Min guard, Sitting Upper Body Bathing: Moderate assistance Lower Body Bathing: Maximal assistance Upper Body Dressing : Moderate assistance Lower Body Dressing: Maximal assistance Toilet Transfer: Moderate assistance, +2 for safety/equipment, Cueing for safety, Cueing for sequencing, Stand-pivot Toileting- Clothing Manipulation and Hygiene: Maximal assistance Functional mobility during ADLs: Moderate assistance, +2 for physical assistance, +2 for safety/equipment, Cueing for safety, Cueing for sequencing   Cognition: Cognition  Overall Cognitive Status: No family/caregiver present to determine baseline cognitive functioning Arousal/Alertness: Awake/alert Orientation Level: Oriented to person, Oriented to place, Oriented to situation, Disoriented to time Attention: Selective Selective Attention: Impaired Selective Attention Impairment: Verbal basic Memory: Impaired Memory Impairment: Storage deficit Awareness: Impaired Awareness Impairment: Intellectual impairment Safety/Judgment: Impaired Cognition Arousal/Alertness:  Awake/alert Behavior During Therapy: Flat affect Overall Cognitive Status: No family/caregiver present to determine baseline cognitive functioning Area of Impairment: Orientation, Attention, Memory, Following commands Orientation Level: Time, Situation Current Attention Level: Alternating Memory: Decreased short-term memory Following Commands: Follows one step commands consistently Safety/Judgement: Decreased awareness of safety, Decreased awareness of deficits Awareness: Emergent Problem Solving: Slow processing General Comments: able to follow one step commands with slight delay;   Physical Exam: Blood pressure (!) 142/77, pulse 81, temperature 98.5 F (36.9 C), temperature source Oral, resp. rate 20, height 5\' 7"  (1.702 m), weight 90.2 kg, SpO2 95 %. Physical Exam Neurological:     Comments: Patient is alert.  Makes eye contact with examiner.  Very mild dysarthria.  Follows commands.  Provides name and age.  He does have some selective attention.    General: No acute distress Mood and affect are appropriate Heart: Regular rate and rhythm no rubs murmurs or extra sounds Lungs: Clear to auscultation, breathing unlabored, no rales or wheezes Abdomen: Positive bowel sounds, soft nontender to palpation, nondistended Extremities: No clubbing, cyanosis, or edema Skin: No evidence of breakdown, no evidence of rash Neurologic: Cranial nerves II through XII intact, motor strength is 5/5 in Left and 4/5 RIght deltoid, bicep, tricep, grip, hip flexor, knee extensors, ankle dorsiflexor and plantar flexor Sensory exam normal sensation to light touch and proprioception in bilateral upper and lower extremities Cerebellar exam normal finger to nose to finger as well as heel to shin in bilateral upper and lower extremities Musculoskeletal: Full range of motion in all 4 extremities. No joint swelling    Lab Results Last 48 Hours        Results for orders placed or performed during the hospital  encounter of 05/05/21 (from the past 48 hour(s))  Glucose, capillary     Status: Abnormal    Collection Time: 05/07/21 12:30 PM  Result Value Ref Range    Glucose-Capillary 200 (H) 70 - 99 mg/dL      Comment: Glucose reference range applies only to samples taken after fasting for at least 8 hours.  Glucose, capillary     Status: Abnormal    Collection Time: 05/07/21  4:49 PM  Result Value Ref Range    Glucose-Capillary 164 (H) 70 - 99 mg/dL      Comment: Glucose reference range applies only to samples taken after fasting for at least 8 hours.  Glucose, capillary     Status: None    Collection Time: 05/07/21  8:39 PM  Result Value Ref Range    Glucose-Capillary 81 70 - 99 mg/dL      Comment: Glucose reference range applies only to samples taken after fasting for at least 8 hours.  Glucose, capillary     Status: Abnormal    Collection Time: 05/08/21  2:21 AM  Result Value Ref Range    Glucose-Capillary 225 (H) 70 - 99 mg/dL      Comment: Glucose reference range applies only to samples taken after fasting for at least 8 hours.  Glucose, capillary     Status: Abnormal    Collection Time: 05/08/21  7:43 AM  Result Value Ref Range    Glucose-Capillary 197 (  H) 70 - 99 mg/dL      Comment: Glucose reference range applies only to samples taken after fasting for at least 8 hours.  Glucose, capillary     Status: Abnormal    Collection Time: 05/08/21 11:54 AM  Result Value Ref Range    Glucose-Capillary 121 (H) 70 - 99 mg/dL      Comment: Glucose reference range applies only to samples taken after fasting for at least 8 hours.  Glucose, capillary     Status: Abnormal    Collection Time: 05/08/21  3:56 PM  Result Value Ref Range    Glucose-Capillary 51 (L) 70 - 99 mg/dL      Comment: Glucose reference range applies only to samples taken after fasting for at least 8 hours.  Urinalysis, Routine w reflex microscopic Urine, Clean Catch     Status: Abnormal    Collection Time: 05/08/21  4:33 PM   Result Value Ref Range    Color, Urine YELLOW YELLOW    APPearance CLEAR CLEAR    Specific Gravity, Urine 1.015 1.005 - 1.030    pH 7.0 5.0 - 8.0    Glucose, UA NEGATIVE NEGATIVE mg/dL    Hgb urine dipstick TRACE (A) NEGATIVE    Bilirubin Urine NEGATIVE NEGATIVE    Ketones, ur NEGATIVE NEGATIVE mg/dL    Protein, ur 100 (A) NEGATIVE mg/dL    Nitrite NEGATIVE NEGATIVE    Leukocytes,Ua TRACE (A) NEGATIVE      Comment: Performed at [facility] [address] [place], [place] [address]  Urinalysis, Microscopic (reflex)     Status: Abnormal    Collection Time: 05/08/21  4:33 PM  Result Value Ref Range    RBC / HPF 11-20 0 - 5 RBC/hpf    WBC, UA 0-5 0 - 5 WBC/hpf    Bacteria, UA RARE (A) NONE SEEN    Squamous Epithelial / LPF 0-5 0 - 5      Comment: Performed at [facility], [place] [address], [place], [place] [address]  Glucose, capillary     Status: Abnormal    Collection Time: 05/08/21  5:21 PM  Result Value Ref Range    Glucose-Capillary 117 (H) 70 - 99 mg/dL      Comment: Glucose reference range applies only to samples taken after fasting for at least 8 hours.  Glucose, capillary     Status: Abnormal    Collection Time: 05/08/21  7:59 PM  Result Value Ref Range    Glucose-Capillary 228 (H) 70 - 99 mg/dL      Comment: Glucose reference range applies only to samples taken after fasting for at least 8 hours.  Glucose, capillary     Status: Abnormal    Collection Time: 05/09/21  7:51 AM  Result Value Ref Range    Glucose-Capillary 193 (H) 70 - 99 mg/dL      Comment: Glucose reference range applies only to samples taken after fasting for at least 8 hours.  CBC     Status: Abnormal    Collection Time: 05/09/21  9:59 AM  Result Value Ref Range    WBC 9.5 4.0 - 10.5 K/uL    RBC 3.94 (L) 4.22 - 5.81 MIL/uL    Hemoglobin 11.1 (L) 13.0 - 17.0 g/dL    HCT 34.9 (L) 39.0 - 52.0 %    MCV 88.6 80.0 - 100.0 fL    MCH 28.2 26.0 - 34.0 pg    MCHC 31.8 30.0 - 36.0  g/dL    RDW  14.9 11.5 - 15.5 %    Platelets 321 150 - 400 K/uL    nRBC 0.0 0.0 - 0.2 %      Comment: Performed at [facility], [place] [address], [place], [place] [id number]  Basic metabolic panel     Status: Abnormal    Collection Time: 05/09/21  9:59 AM  Result Value Ref Range    Sodium 135 135 - 145 mmol/L    Potassium 3.7 3.5 - 5.1 mmol/L    Chloride 99 98 - 111 mmol/L    CO2 26 22 - 32 mmol/L    Glucose, Bld 259 (H) 70 - 99 mg/dL      Comment: Glucose reference range applies only to samples taken after fasting for at least 8 hours.    BUN 16 8 - 23 mg/dL    Creatinine, Ser 1.13 0.61 - 1.24 mg/dL    Calcium 8.0 (L) 8.9 - 10.3 mg/dL    GFR, Estimated >60 >60 mL/min      Comment: (NOTE) Calculated using the CKD-EPI Creatinine Equation (2021)      Anion gap 10 5 - 15      Comment: Performed at [facility] [address] [place], [place] [address]      Imaging Results (Last 48 hours)  No results found.           Medical Problem List and Plan: 1. Functional deficits with dizziness and gait abnormality secondary to right PICA infarction as well as history of left ICH subcortical and right temporal embolic infarct [id number]             -patient may  shower             -ELOS/Goals: 10-12d 2.  Antithrombotics: -DVT/anticoagulation:  Pharmaceutical: Heparin             -antiplatelet therapy: Aspirin 81 mg daily and Plavix 75 mg day x3 weeks then Plavix alone 3. Pain Management: Tylenol as needed 4. Mood: Provide emotional support             -antipsychotic agents: N/A 5. Neuropsych: This patient is capable of making decisions on his own behalf. 6. Skin/Wound Care: Routine skin checks 7. Fluids/Electrolytes/Nutrition: Routine in and outs with follow-up chemistries 8.  Diabetes mellitus.  Hemoglobin A1c 9.9.  NovoLog 3 units 3 times daily, Levemir  units  daily.  Check blood sugars before meals and at bedtime 9.  Hypertension.  Lisinopril 40 mg daily, Toprol-XL 50 mg daily,  hydralazine 50 mg every 8 hours, [place] 25 mg daily.  Monitor with increased mobility  10.  Hyperlipidemia.  Lipitor 11.  History of gout.  Allopurinol.  Monitor for any gout flareups       Cathlyn Parsons, PA-C 05/09/2021  "I have personally performed a face to face diagnostic evaluation of this patient.  Additionally, I have reviewed and concur with the physician assistant's documentation above."  Charlett Blake M.D. [facility] Fellow Am Acad of [employer] Med and Rehab Diplomate Am Board of Electrodiagnostic Med Fellow Am Board of Interventional Pain

## 2021-05-10 DIAGNOSIS — G463 Brain stem stroke syndrome: Secondary | ICD-10-CM | POA: Diagnosis not present

## 2021-05-10 LAB — GLUCOSE, CAPILLARY
Glucose-Capillary: 104 mg/dL — ABNORMAL HIGH (ref 70–99)
Glucose-Capillary: 174 mg/dL — ABNORMAL HIGH (ref 70–99)
Glucose-Capillary: 159 mg/dL — ABNORMAL HIGH (ref 70–99)
Glucose-Capillary: 187 mg/dL — ABNORMAL HIGH (ref 70–99)

## 2021-05-10 MED ORDER — METOPROLOL SUCCINATE ER 25 MG PO TB24
100.0000 mg | ORAL_TABLET | Freq: Every day | ORAL | Status: DC
  Administered 2021-05-11 – 2021-05-27 (×15): 100 mg via ORAL
  Filled 2021-05-10 (×17): qty 4

## 2021-05-10 NOTE — Evaluation (Signed)
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  Per chart review patient currently lives with a friend.  He is married but currently separated.  Patient does have good family support.  Patient was able to feed himself dress groom and toilet himself but does have a sedentary lifestyle.  Caregiver/friend does assist with some meal preparation and medication management.  Presented 05/05/2021 with dizziness and vomiting x3 days.  CT/MRI showed large region of acute/early subacute infarct affecting the inferior cerebellum on the right consistent with right PICA territory infarction.  Petechial blood products but no frank hematoma.  Mass-effect upon the fourth ventricle without evidence of obstruction.  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  No large vessel occlusion or hemodynamically significant  stenosis.  Admission chemistry unremarkable except glucose 300, hemoglobin 10.5, WBC 11,400, urine drug screen negative, hemoglobin A1c 9.9.  Echocardiogram with ejection fraction of 45 to 56% grade 2 diastolic dysfunction.  Neurology follow-up maintained on aspirin and Plavix DAPT for 3 weeks then Plavix alone.  Patient declined loop recorder but plan 30-day cardiac event monitor as outpatient.  Subcutaneous heparin for DVT prophylaxis.  Tolerating a regular consistency diet.  Therapy evaluations completed due to patient decreased functional mobility was admitted for a comprehensive rehab program.Patient transferred to CIR on 05/09/2021 .   Patient currently requires min with mobility secondary to muscle weakness, impaired timing and sequencing and decreased coordination, decreased attention, decreased problem solving, decreased safety awareness, and delayed processing, and hemiplegia and decreased balance strategies.  Prior to hospitalization, patient was modified independent  with mobility and lived with Friend(s), Other (Comment) Coralyn Mark (caregiver)) in a House home.  Home access is At time of PT eval patient reports having 1 STE but at time of OT eval patient reports having 2 STEStairs to enter.  Patient will benefit from skilled PT intervention to maximize safe functional mobility, minimize fall risk, and decrease caregiver burden for planned discharge home with 24 hour supervision.  Anticipate patient will benefit from follow up [place] at discharge.  PT - End of Session Activity Tolerance: Tolerates 10 - 20 min activity with multiple rests Endurance Deficit: Yes Endurance Deficit Description: Quick to fatigue PT Assessment Rehab Potential (ACUTE/IP ONLY): Good PT Barriers to Discharge: [place] home environment;Decreased caregiver support;Home environment access/layout;Incontinence;Lack of/limited family support;Weight PT Patient demonstrates impairments in the following area(s):  Balance;Perception;Safety;Edema;Sensory;Endurance;Motor PT Transfers Functional Problem(s): Bed Mobility;Bed to Chair;Car PT Locomotion Functional Problem(s): Ambulation;Wheelchair Mobility;Stairs PT Plan PT Intensity: Minimum of 1-2  x/day ,45 to 90 minutes PT Frequency: 5 out of 7 days PT Duration Estimated Length of Stay: 2-2.5 weeks PT Treatment/Interventions: Ambulation/gait training;Cognitive remediation/compensation;Discharge planning;DME/adaptive equipment instruction;Functional mobility training;Pain management;Psychosocial support;Splinting/orthotics;Therapeutic Activities;UE/LE Strength taining/ROM;Visual/perceptual remediation/compensation;Wheelchair propulsion/positioning;UE/LE Coordination activities;Therapeutic Exercise;Stair training;Skin care/wound management;Patient/family education;Neuromuscular re-education;Functional electrical stimulation;Disease management/prevention;Community reintegration;Balance/vestibular training PT Transfers Anticipated Outcome(s): supervision PT Locomotion Anticipated Outcome(s): supervision short distance gait PT Recommendation Recommendations for Other Services: Speech consult Follow Up Recommendations: Home health PT Patient destination: Home Equipment Recommended: To be determined Equipment Details: likely RW and w/c, unsure what pt has (questionable historian)   PT Evaluation Precautions/Restrictions Precautions Precautions: Fall Precaution Comments: Reports previous CVA with residual L weakness Restrictions Weight Bearing Restrictions: No General   Vital Signs Pain Pain Assessment Pain Scale: 0-10 Pain Score: 0-No pain Pain Interference Pain Interference Pain Effect on Sleep: 0. Does not apply - I have not had any pain or hurting in the past 5 days Pain Interference with Therapy Activities: 0. Does not apply - I have not received rehabilitationtherapy in the past 5 days Pain Interference with Day-to-Day Activities: 1. Rarely or  not at all Home Living/Prior [place] Available Help at Discharge: Available PRN/intermittently Type of Home: House Home Access: Stairs to enter [employer] of Steps: At time of PT eval patient reports having 1 STE but at time of OT eval patient reports having 2 STE Entrance Stairs-Rails: Left (Reports no rail at time of PT eval but reports L rail at time of OT eval.) Home Layout: One level Bathroom Shower/Tub: Chiropodist: Standard  Lives With: Friend(s);Other (Comment) Coralyn Mark (caregiver)) Prior Function Level of Independence: Requires assistive device for independence;Needs assistance with tranfers;Needs assistance with ADLs  Able to Take Stairs?: No Driving: No Vocation: Retired Radiographer, therapeutic - History Ability to See in Adequate Light: 0 Adequate Perception Perception: Impaired Praxis Praxis: Intact  Cognition Overall Cognitive Status: No family/caregiver present to determine baseline cognitive functioning Arousal/Alertness: Awake/alert Orientation Level: Oriented to person;Disoriented to situation;Disoriented to place;Disoriented to time Year: Other (Comment) (2003) Month: January Day of Week: Incorrect Attention: Sustained Sustained Attention: Impaired Selective Attention: Impaired Selective Attention Impairment: Verbal basic Memory: Impaired Memory Impairment: Storage deficit;Decreased short term memory Decreased Short Term Memory: Verbal basic Immediate Memory Recall: Sock;Blue;Bed Memory Recall Sock: Not able to recall Memory Recall Blue: Not able to recall Memory Recall Bed: Not able to recall Awareness: Impaired Awareness Impairment: Intellectual impairment Problem Solving: Impaired Problem Solving Impairment: Verbal basic Executive Function: Reasoning;Organizing;Sequencing Reasoning: Impaired Sequencing: Impaired Organizing: Impaired Behaviors: Verbal agitation Safety/Judgment: Impaired Comments:  Decreased knowledge of current deficits; decreased safety awareness [employer] Scales of Cognitive Functioning: Confused/appropriate Sensation Sensation Light Touch: Impaired Detail Central sensation comments: Difficulty to assess 2/2 lethargy. Patient initially reporting numbness throughout entire LUE but upon palpation reports no numbness. Will continue to assess. Peripheral sensation comments: impaired LLE Light Touch Impaired Details: Impaired LLE Hot/Cold: Not tested Stereognosis: Impaired by gross assessment (Correclty identified 1/2 items (tube of toothpate but not pen).) Coordination Gross Motor Movements are Fluid and Coordinated: No Fine Motor Movements are Fluid and Coordinated: No Coordination and Movement Description: RUE <LUE uncoordination Finger Nose Finger Test: RUE <LUE dyskinesia Motor  Motor Motor: Hemiplegia Motor - Skilled Clinical Observations: L hemi   Trunk/Postural Assessment  Cervical Assessment Cervical Assessment: Within Functional Limits Thoracic Assessment Thoracic Assessment: Within Functional Limits Lumbar Assessment Lumbar Assessment: Within Functional Limits Postural Control Postural Control: Deficits on evaluation (delayed and insufficient)  Balance Balance Balance Assessed: Yes  Static Sitting Balance Static Sitting - Balance Support: No upper extremity supported;Feet supported Static Sitting - Level of Assistance: 5: Stand by assistance Dynamic Sitting Balance Dynamic Sitting - Balance Support: No upper extremity supported;Feet supported Dynamic Sitting - Level of Assistance: 4: Min assist Static Standing Balance Static Standing - Balance Support: Bilateral upper extremity supported Static Standing - Level of Assistance: 4: Min assist Dynamic Standing Balance Dynamic Standing - Balance Support: Bilateral upper extremity supported Dynamic Standing - Level of Assistance: 4: Min assist Extremity Assessment  RUE Assessment RUE  Assessment: Exceptions to [facility] Passive Range of Motion (PROM) Comments: WFL Active Range of Motion (AROM) Comments: WFL General Strength Comments: MMT grossly 4-/5 LUE Assessment LUE Assessment: Exceptions to [facility] Passive Range of Motion (PROM) Comments: WFL Active Range of Motion (AROM) Comments: Increased tone vs lethargy limiting assessment General Strength Comments: MMT grossly 3+/5 LUE Body System: Neuro Brunstrum levels for arm and hand: Arm;Hand Brunstrum level for arm: Stage III Synergy is performed voluntarily Brunstrum level for hand: Stage III Synergies performed voluntarily RLE Assessment RLE Assessment: Exceptions to [facility] General Strength Comments: Grossly 3+/5 LLE Assessment LLE Assessment: Exceptions to [facility] General Strength Comments: Grossly 3+/5  Care Tool Care Tool Bed Mobility Roll left and right activity   Roll left and right assist level: Minimal Assistance - Patient > 75%    Sit to lying activity   Sit to lying assist level: Minimal Assistance - Patient > 75%    Lying to sitting on side of bed activity   Lying to sitting on side of bed assist level: the ability to move from lying on the back to sitting on the side of the bed with no back support.: Moderate Assistance - Patient 50 - 74%     Care Tool Transfers Sit to stand transfer   Sit to stand assist level: Minimal Assistance - Patient > 75%    Chair/bed transfer Chair/bed transfer activity did not occur: Refused Chair/bed transfer assist level: Minimal Assistance - Patient > 75%     Toilet transfer Toilet transfer activity did not occur: Social worker transfer activity did not occur: Refused        Care Tool Locomotion Ambulation   Assist level: Minimal Assistance - Patient > 75% Assistive device: Walker-rolling Max distance: 25  Walk 10 feet activity   Assist level: Minimal Assistance - Patient > 75% Assistive device: Walker-rolling   Walk 50 feet with 2 turns activity Walk  50 feet with 2 turns activity did not occur: Safety/medical concerns      Walk 150 feet activity Walk 150 feet activity did not occur: Safety/medical concerns      Walk 10 feet on uneven surfaces activity Walk 10 feet on uneven surfaces activity did not occur: Safety/medical concerns      Stairs Stair activity did not occur: Safety/medical concerns        Walk up/down 1 step activity Walk up/down 1 step or curb (drop down) activity did not occur: Safety/medical concerns      Walk up/down 4 steps activity Walk up/down 4 steps activity did not occur: Safety/medical concerns      Walk up/down 12 steps activity Walk up/down 12 steps activity did not occur: Safety/medical concerns      Pick up small objects from floor Pick up small object from the floor (from standing position) activity did not occur: Safety/medical concerns      Wheelchair Is the patient using a wheelchair?:  Yes Type of Wheelchair: Manual   Wheelchair assist level: Maximal Assistance - Patient 25 - 49% Max wheelchair distance: 20  Wheel 50 feet with 2 turns activity Wheelchair 50 feet with 2 turns activity did not occur: Safety/medical concerns    Wheel 150 feet activity Wheelchair 150 feet activity did not occur: Safety/medical concerns      Refer to Care Plan for Long Term Goals  SHORT TERM GOAL WEEK 1 PT Short Term Goal 1 (Week 1): Pt will initiate stair training PT Short Term Goal 2 (Week 1): Pt will perform STS transfers with CGA PT Short Term Goal 3 (Week 1): Pt will ambulate x 50 ft with LRAD  Recommendations for other services: None   Skilled Therapeutic Intervention Mobility Bed Mobility Bed Mobility: Rolling Right;Rolling Left;Sit to Supine;Right Sidelying to Sit Rolling Right: Minimal Assistance - Patient > 75% Rolling Left: Minimal Assistance - Patient > 75% Right Sidelying to Sit: Minimal Assistance - Patient > 75% Supine to Sit: Minimal Assistance - Patient > 75% Sit to Supine: Minimal  Assistance - Patient > 75% Transfers Transfers: Sit to Stand;Stand to Sit Sit to Stand: Minimal Assistance - Patient > 75% Stand to Sit: Minimal Assistance - Patient > 75% Transfer (Assistive device): Rolling walker Locomotion  Gait Ambulation: Yes Gait Assistance: Minimal Assistance - Patient > 75% Gait Distance (Feet): 25 Feet Assistive device: Rolling walker Gait Assistance Details: Verbal cues for safe use of DME/AE;Verbal cues for precautions/safety Gait Gait: Yes Gait Pattern: Impaired Gait Pattern: Decreased stride length;Wide base of support Stairs / Additional Locomotion Stairs: No Architect: Yes Wheelchair Assistance: Maximal Assistance - Patient 25 - 49% Wheelchair Propulsion: Both upper extremities Wheelchair Parts Management: Needs assistance Distance: 20 ft  Skilled Therapeutic Interventions/Progress Updates:  pt received in bed and agreeable to therapy. Pt demoes lethargy and difficulty staying awake with subjective exam. Arousal improved with elevating HOB and opening blinds for light. Evaluation completed (see details above and below) with education on PT POC and goals and individual treatment initiated with focus on transfers and gait training. Bed mobility with min A for trunk management. Sit to stand and Stand pivot transfer with RW and min A. Pt reports dizziness throughout session, but at rest and with activity that does not change. MMT and sensation testing performed as documented. Pt ambulated x 25 ft with min A and w/c follow for safety. At this time, pt's brief noted to be completely saturated. Pt transported back to room. Stand pivot transfer to EOB. Dependent brief change in standing, with soaked through brief (sheets and 2 chuck pads also soaked through). Pt able to stand with distant supervision at times with RW x 3 min. Pt returned to bed with clean brief, gown, and sheets. Pt was left with all needs in reach and alarm active.    Discharge Criteria: Patient will be discharged from PT if patient refuses treatment 3 consecutive times without medical reason, if treatment goals not met, if there is a change in medical status, if patient makes no progress towards goals or if patient is discharged from hospital.  The above assessment, treatment plan, treatment alternatives and goals were discussed and mutually agreed upon: by patient  Mickel Fuchs 05/10/2021, 12:42 PM

## 2021-05-10 NOTE — Progress Notes (Signed)
PROGRESS NOTE   Subjective/Complaints:  Very tired after therapy this am , reviewed labs, no breathing complaints or bladder issues  ROS- neg CP, SOB, N/V/D Objective:   No results found. Recent Labs    05/09/21 0959 05/09/21 1647  WBC 9.5 13.2*  HGB 11.1* 10.6*  HCT 34.9* 32.1*  PLT 321 292   Recent Labs    05/09/21 0959 05/09/21 1647  NA 135  --   K 3.7  --   CL 99  --   CO2 26  --   GLUCOSE 259*  --   BUN 16  --   CREATININE 1.13 1.27*  CALCIUM 8.0*  --     Intake/Output Summary (Last 24 hours) at 05/10/2021 1003 Last data filed at 05/09/2021 1520 Gross per 24 hour  Intake --  Output 125 ml  Net -125 ml     Pressure Injury 11/30/19 Sacrum Medial Stage 2 -  Partial thickness loss of dermis presenting as a shallow open injury with a red, pink wound bed without slough. medial, left/right sacral area (Active)  11/30/19 0800  Location: Sacrum  Location Orientation: Medial  Staging: Stage 2 -  Partial thickness loss of dermis presenting as a shallow open injury with a red, pink wound bed without slough.  Wound Description (Comments): medial, left/right sacral area  Present on Admission: Yes    Physical Exam: Vital Signs Blood pressure (!) 159/71, pulse 72, temperature 98.2 F (36.8 C), resp. rate 18, SpO2 93 %.   General: No acute distress Mood and affect are appropriate Heart: Regular rate and rhythm no rubs murmurs or extra sounds Lungs: Clear to auscultation, breathing unlabored, no rales or wheezes Abdomen: Positive bowel sounds, soft nontender to palpation, nondistended Extremities: No clubbing, cyanosis, or edema Skin: No evidence of breakdown, no evidence of rash Neurologic: Cranial nerves II through XII intact, motor strength is 5/5 in bilateral deltoid, bicep, tricep, grip, hip flexor, knee extensors, ankle dorsiflexor and plantar flexor Sensory exam normal sensation to light touch and  proprioception in bilateral upper and lower extremities Cerebellar exam normal finger to nose to finger as well as heel to shin in bilateral upper and lower extremities Musculoskeletal: Full range of motion in all 4 extremities. No joint swelling   Assessment/Plan: 1. Functional deficits which require 3+ hours per day of interdisciplinary therapy in a comprehensive inpatient rehab setting. Physiatrist is providing close team supervision and 24 hour management of active medical problems listed below. Physiatrist and rehab team continue to assess barriers to discharge/monitor patient progress toward functional and medical goals  Care Tool:  Bathing              Bathing assist       Upper Body Dressing/Undressing Upper body dressing   What is the patient wearing?: Hospital gown only    Upper body assist Assist Level: Maximal Assistance - Patient 25 - 49%    Lower Body Dressing/Undressing Lower body dressing      What is the patient wearing?: Hospital gown only     Lower body assist       Toileting Toileting    Toileting assist  Transfers Chair/bed transfer  Transfers assist           Locomotion Ambulation   Ambulation assist              Walk 10 feet activity   Assist           Walk 50 feet activity   Assist           Walk 150 feet activity   Assist           Walk 10 feet on uneven surface  activity   Assist           Wheelchair     Assist               Wheelchair 50 feet with 2 turns activity    Assist            Wheelchair 150 feet activity     Assist          Blood pressure (!) 159/71, pulse 72, temperature 98.2 F (36.8 C), resp. rate 18, SpO2 93 %.  Medical Problem List and Plan: 1. Functional deficits with dizziness and gait abnormality secondary to right PICA infarction 05/02/21  as well as history of left ICH subcortical and right temporal embolic infarct 7/82              -patient may  shower             -ELOS/Goals: 10-12d 2.  Antithrombotics: -DVT/anticoagulation:  Pharmaceutical: Heparin             -antiplatelet therapy: Aspirin 81 mg daily and Plavix 75 mg day x3 weeks then Plavix alone 3. Pain Management: Tylenol as needed 4. Mood: Provide emotional support             -antipsychotic agents: N/A 5. Neuropsych: This patient is capable of making decisions on his own behalf. 6. Skin/Wound Care: Routine skin checks 7. Fluids/Electrolytes/Nutrition: Routine in and outs with follow-up chemistries 8.  Diabetes mellitus.  Hemoglobin A1c 9.9.  NovoLog 3 units 3 times daily, Levemir  units  daily.  Check blood sugars before meals and at bedtime CBG (last 3)  Recent Labs    05/09/21 1657 05/09/21 2102 05/10/21 0635  GLUCAP 267* 146* 104*  Some lability will monitor before changing management   9.  Hypertension.  Lisinopril 40 mg daily, Toprol-XL 50 mg daily, hydralazine 50 mg every 8 hours, [place] 25 mg daily.  Monitor with increased mobility   Vitals:   05/10/21 0457 05/10/21 0810  BP: (!) 168/95 (!) 159/71  Pulse: 72 72  Resp: 18   Temp: 98.2 F (36.8 C)   SpO2: 93%    Increase toprol to 100mg  qd, HR should tolerate increase  10.  Hyperlipidemia.  Lipitor 11.  History of gout.  Allopurinol.  Monitor for any gout flareups    LOS: 1 days A FACE TO FACE EVALUATION WAS PERFORMED  05/10/2021, 10:03 AM

## 2021-05-10 NOTE — Evaluation (Signed)
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  Per chart review patient currently lives with a friend.  He is married but currently separated.  Patient does have good family support.  Patient was able to feed himself dress groom and toilet himself but does have a sedentary lifestyle.  Caregiver/friend does assist with some meal preparation and medication management.  Presented 05/05/2021 with dizziness and vomiting x3 days.  CT/MRI showed large region of acute/early subacute infarct affecting the inferior cerebellum on the right consistent with right PICA territory infarction.  Petechial blood products but no frank hematoma.  Mass-effect upon the fourth ventricle without evidence of obstruction.  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  No  large vessel occlusion or hemodynamically significant stenosis.  Admission chemistry unremarkable except glucose 300, hemoglobin 10.5, WBC 11,400, urine drug screen negative, hemoglobin A1c 9.9.  Echocardiogram with ejection fraction of 45 to 00% grade 2 diastolic dysfunction.  Neurology follow-up maintained on aspirin and Plavix DAPT for 3 weeks then Plavix alone.  Patient declined loop recorder but plan 30-day cardiac event monitor as outpatient.  Subcutaneous heparin for DVT prophylaxis.  Tolerating a regular consistency diet. Patient transferred to CIR on 05/09/2021 .    Patient currently requires mod-max with basic self-care skills secondary to impaired timing and sequencing, abnormal tone, unbalanced muscle activation, and decreased coordination, decreased attention to left, and decreased initiation, decreased attention, decreased awareness, decreased problem solving, decreased safety awareness, decreased memory, and delayed processing.  Prior to hospitalization, patient could complete ADLs with some assist from caregiver.   Patient will benefit from skilled intervention to decrease level of assist with basic self-care skills and increase independence with basic self-care skills prior to discharge home with care partner.  Anticipate patient will require 24 hour supervision and minimal physical assistance and follow up home health.  OT - End of Session Activity Tolerance: Improving Endurance Deficit: Yes Endurance Deficit Description: Quick to fatigue OT Assessment Rehab Potential (ACUTE ONLY): Good OT Barriers to Discharge: Inaccessible home environment;Decreased caregiver support OT Patient demonstrates impairments in the following area(s): Balance;Cognition;Endurance;Motor;Safety;Sensory OT Basic ADL's Functional Problem(s): Grooming;Bathing;Dressing;Toileting OT Transfers Functional Problem(s): Toilet;Tub/Shower OT Additional Impairment(s): Fuctional Use of Upper Extremity OT Plan OT  Intensity: Minimum of 1-2 x/day, 45 to 90 minutes OT Frequency: 5 out of 7 days OT Duration/Estimated Length of Stay: 2-2.5 weeks OT Treatment/Interventions: Balance/vestibular training;Cognitive remediation/compensation;Community reintegration;Discharge planning;DME/adaptive equipment instruction;Functional electrical stimulation;Functional mobility training;Neuromuscular re-education;Patient/family  education;Psychosocial support;Self Care/advanced ADL retraining;Therapeutic Activities;Therapeutic Exercise;UE/LE Strength taining/ROM;UE/LE Coordination activities;Wheelchair propulsion/positioning OT Self Feeding Anticipated Outcome(s): N/A OT Basic Self-Care Anticipated Outcome(s): S to [employer] A OT Toileting Anticipated Outcome(s): Min A OT Bathroom Transfers Anticipated Outcome(s): S to [employer] A OT Recommendation Recommendations for Other Services: Speech consult;Therapeutic Recreation consult Therapeutic Recreation Interventions: Stress management Patient destination: Home Follow Up Recommendations: Home health OT Equipment Recommended: To be determined   OT Evaluation Precautions/Restrictions  Precautions Precautions: Fall Precaution Comments: Reports previous CVA with residual L weakness Restrictions Weight Bearing Restrictions: No General Chart Reviewed: Yes OT Amount of Missed Time: 32 Minutes Family/Caregiver Present: No Vital Signs Therapy Vitals Pulse Rate: 72 BP: (!) 159/71 Patient Position (if appropriate): Lying Pain Pain Assessment Pain Scale: 0-10 Pain Score: 0-No pain Home Living/Prior Functioning Home Living Family/patient expects to be discharged to:: Private residence Living Arrangements: Alone, Other (Comment) (Caregiver- Coralyn Mark) Available Help at Discharge: Available PRN/intermittently Type of Home: House Home Access: Stairs to enter [employer] of Steps: At time of PT eval patient reports having 1 STE but at time of OT eval patient reports having 2  STE Entrance Stairs-Rails: Left (Reports no rail at time of PT eval but reports L rail at time of OT eval.) Home Layout: One level Bathroom Shower/Tub: Chiropodist: Standard  Lives With: Friend(s), Other (Comment) Advice worker (caregiver)) IADL History Occupation: Retired Type of Occupation: Actuary Level of Independence: Requires assistive device for independence, Needs assistance with tranfers, Needs assistance with ADLs  Able to [place]?: No Driving: No Vocation: Retired Surveyor, mining Baseline Vision/History: 1 Wears glasses Ability to See in Adequate Light: 0 Adequate Patient Visual Report: No change from baseline Vision Assessment?: No apparent visual deficits Perception  Perception: Impaired Praxis Praxis: Intact  Cognition Overall Cognitive Status: No family/caregiver present to determine baseline cognitive functioning Arousal/Alertness: Awake/alert Orientation Level: Person;Place;Situation Person: Oriented Place: Oriented Situation: Oriented Year: 2023 Month: January Day of Week: Correct Memory: Impaired Memory Impairment: Storage deficit;Decreased short term memory Decreased Short Term Memory: Verbal basic Immediate Memory Recall: Sock;Blue;Bed Memory Recall Sock: Not able to recall Memory Recall Blue: Not able to recall Memory Recall Bed: Not able to recall Attention: Sustained Sustained Attention: Impaired Selective Attention: Impaired Selective Attention Impairment: Verbal basic Awareness: Impaired Awareness Impairment: Intellectual impairment Safety/Judgment: Impaired Comments: Decreased knowledge of current deficits; decreased safety awareness Sensation Sensation Light Touch: Impaired Detail Central sensation comments: Difficulty to assess 2/2 lethargy. Patient initially reporting numbness throughout entire LUE but upon palpation reports no numbness. Will continue to assess. Peripheral sensation comments: impaired  LLE Light Touch Impaired Details: Impaired LLE Hot/Cold: Not tested Proprioception: Appears Intact Stereognosis: Impaired by gross assessment (Correclty identified 1/2 items (tube of toothpate but not pen).) Coordination Gross Motor Movements are Fluid and Coordinated: No Fine Motor Movements are Fluid and Coordinated: No Coordination and Movement Description: RUE <LUE uncoordination Finger Nose Finger Test: RUE <LUE dyskinesia Motor  Motor Motor: Hemiplegia Motor - Skilled Clinical Observations: L hemi  Trunk/Postural Assessment  Cervical Assessment Cervical Assessment: Within Functional Limits Thoracic Assessment Thoracic Assessment: Exceptions to WFL (Mild kyphosis) Lumbar Assessment Lumbar Assessment: Exceptions to [facility] Postural Control Postural Control: Deficits on evaluation (Delayed; insufficient)  Balance Balance Balance Assessed: Yes Static Sitting Balance Static Sitting - Balance Support: No upper extremity supported;Feet supported Static Sitting - Level of Assistance: 5: Stand by assistance Dynamic Sitting Balance Dynamic Sitting - Balance Support: No upper extremity supported;Feet supported Dynamic Sitting - Level of Assistance: 4: Min assist  Static Standing Balance Static Standing - Balance Support: Bilateral upper extremity supported Static Standing - Level of Assistance: 4: Min assist Dynamic Standing Balance Dynamic Standing - Balance Support: Bilateral upper extremity supported Dynamic Standing - Level of Assistance: 4: Min assist Extremity/Trunk Assessment RUE Assessment RUE Assessment: Exceptions to [facility] Passive Range of Motion (PROM) Comments: WFL Active Range of Motion (AROM) Comments: WFL General Strength Comments: MMT grossly 4-/5 LUE Assessment LUE Assessment: Exceptions to [facility] Passive Range of Motion (PROM) Comments: WFL Active Range of Motion (AROM) Comments: Increased tone vs lethargy limiting assessment General Strength Comments: MMT grossly  3+/5 LUE Body System: Neuro Brunstrum levels for arm and hand: Arm;Hand Brunstrum level for arm: Stage III Synergy is performed voluntarily Brunstrum level for hand: Stage III Synergies performed voluntarily  Care Tool Care Tool Self Care Eating Eating activity did not occur: Refused      Oral Care  Oral care, brush teeth, clean dentures activity did not occur: Refused      Bathing   Body parts bathed by patient: Left arm;Chest;Abdomen;Front perineal area;Right upper leg;Left upper leg;Face Body parts bathed by helper: Right arm;Buttocks;Right lower leg;Left lower leg   Assist Level: Moderate Assistance - Patient 50 - 74%    Upper Body Dressing(including orthotics)   What is the patient wearing?: Hospital gown only   Assist Level: Minimal Assistance - Patient > 75%    Lower Body Dressing (excluding footwear)   What is the patient wearing?: Underwear/pull up Assist for lower body dressing: Maximal Assistance - Patient 25 - 49%    Putting on/Taking off footwear Putting on/taking off footwear activity did not occur: Refused           Care Tool Toileting Toileting activity Toileting Activity did not occur (Clothing management and hygiene only): Refused       Care Tool Bed Mobility Roll left and right activity   Roll left and right assist level: Minimal Assistance - Patient > 75%    Sit to lying activity   Sit to lying assist level: Moderate Assistance - Patient 50 - 74%    Lying to sitting on side of bed activity         Care Tool Transfers Sit to stand transfer   Sit to stand assist level: Moderate Assistance - Patient 50 - 74%    Chair/bed transfer Chair/bed transfer activity did not occur: Producer, television/film/video transfer activity did not occur: Refused       Care Tool Cognition  Expression of Ideas and Wants Expression of Ideas and Wants: 3. Some difficulty - exhibits some difficulty with expressing needs and ideas (e.g, some words or  finishing thoughts) or speech is not clear  Understanding Verbal and Non-Verbal Content Understanding Verbal and Non-Verbal Content: 3. Usually understands - understands most conversations, but misses some part/intent of message. Requires cues at times to understand   Memory/Recall Ability Memory/Recall Ability : None of the above were recalled   Refer to Care Plan for Long Term Goals  SHORT TERM GOAL WEEK 1 OT Short Term Goal 1 (Week 1): Patient will complete 1/3 parts of toileting task with Min A and LRAD. OT Short Term Goal 2 (Week 1): Patient will don UB clothing with set-up assist in sitting. OT Short Term Goal 3 (Week 1): Patient will don LB clothing with Min A and LRAD. OT Short Term Goal 4 (Week 1): Patient will complete toilet transfer with Min A and LRAD.  Recommendations for other services: Other: TBD    Skilled Therapeutic Intervention Patient met lying supine in bed in agreement with OT treatment session. Mild pain reported in R shoulder while attempting to wash buttocks in standing (?chronic L shoulder pain?). Patient initially refusing OT eval but after education patient in agreement with bathing/dressing at EOB. Declined toileting and grooming. Please refer below for details. Patient greatly limited by lethargy, L hemiparesis, and decreased cognition. Good attention to L visual field and L side of body with bathing/dressing seated EOB. Patient able to manage ADL items (bottle of soap and deodorant) with fair bimanual manipulation. Did not drop items. Patient would benefit from continued skilled OT to maximize safety/independence with self-care tasks in prep for safe d/c home. Session concluded with patient lying supine in bed with call bell within reach, bed alarm activated and all needs met.   ADL ADL Eating: Not assessed Grooming: Not assessed Upper Body Bathing: Minimal assistance Where Assessed-Upper Body Bathing: Edge of bed Lower Body Bathing: Moderate assistance Where  Assessed-Lower Body Bathing: Edge of bed Upper Body Dressing: Minimal assistance Where Assessed-Upper Body Dressing: Edge of bed Lower Body Dressing: Maximal assistance Where Assessed-Lower Body Dressing: Edge of bed Toileting: Not assessed Toilet Transfer Method: Not assessed Tub/Shower Transfer: Not assessed Mobility  Bed Mobility Bed Mobility: Rolling Right;Rolling Left;Sit to Supine;Right Sidelying to Sit Rolling Right: Minimal Assistance - Patient > 75% Rolling Left: Minimal Assistance - Patient > 75% Right Sidelying to Sit: Moderate Assistance - Patient 50-74% Supine to Sit: Minimal Assistance - Patient > 75% Transfers Sit to Stand: Moderate Assistance - Patient 50-74% Stand to Sit: Minimal Assistance - Patient > 75%   Discharge Criteria: Patient will be discharged from OT if patient refuses treatment 3 consecutive times without medical reason, if treatment goals not met, if there is a change in medical status, if patient makes no progress towards goals or if patient is discharged from hospital.  The above assessment, treatment plan, treatment alternatives and goals were discussed and mutually agreed upon: by patient  Terence Googe R [place] 05/10/2021, 11:39 AM

## 2021-05-10 NOTE — Evaluation (Signed)
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  Per chart review patient currently lives with a friend.  He is married but currently separated.  Patient does have good family support.  Patient was able to feed himself dress groom and toilet himself but does have a sedentary lifestyle.  Caregiver/friend does assist with some meal preparation and medication management.  Presented 05/05/2021 with dizziness and vomiting x3 days.  CT/MRI showed large region of acute/early subacute infarct affecting the inferior cerebellum on the right consistent with right PICA territory infarction.  Petechial blood products but no frank hematoma.  Mass-effect upon the fourth ventricle without evidence of obstruction.  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  No large vessel occlusion or hemodynamically significant stenosis.  Admission chemistry unremarkable  except glucose 300, hemoglobin 10.5, WBC 11,400, urine drug screen negative, hemoglobin A1c 9.9.  Echocardiogram with ejection fraction of 45 to 41% grade 2 diastolic dysfunction.  Neurology follow-up maintained on aspirin and Plavix DAPT for 3 weeks then Plavix alone.  Patient declined loop recorder but plan 30-day cardiac event monitor as outpatient.  Subcutaneous heparin for DVT prophylaxis.  SLP observed pt during breakfast. Throughout breakfast, pt demonstrated a significantly "gurgly" vocal quality. He appeared to have most difficulty with liquids and managing saliva. When drinking, pt would take several consecutive swallows (until he finished drink). While drinking, pt produced what appeared to be a reflexive cough x1 and continuously throat cleared throughout the session. Pt was resistant to cueing, but he did throat clear and swallow x2 which cleared vocal quality. SLP also cued pt to take single drinks x3 and this eliminated overt s/sx of aspiration. Also noted L anterior labial spillage with saliva, but none noted with drinks. Pt was edentulous and was noted to "chew on his tongue" throughout evaluation. When asked, pt reported "I always chew on my [id number]-[id number]". SLP suspects coughing and throat clears could be 2/2 decreased attention. SLP provided pt with provale cup and updated boards in patient room. Would rec observing with additional meals. SLP assisted cognition via [place] Status Examination. Pt scored a 12/30 indicating cognitive-communication impairment characterized by impulsivity, decreased attention, memory, executive functioning, awareness, and cognitive flexibility. He also experienced decreased problem solving and benefited from repetition of information. On [place] Status Examination, pt scored a 12/30. He also exhibits a mild dysarthria in conversation; however, SLP was able to understand 75-100% of the time. Pt was resistant throughout session and required extensive  encouragement to participate. At this time,  he was resistant to cueing from SLP.    Skilled Therapeutic Interventions          SLP administered assessment to pt and completed CSE at bedside. SLP rec f/u with dysphagia (as he was not described to have any in acute. Pt was left in room with bed alarm activated and all immediate needs within reach.    SLP Assessment  Patient will need skilled Speech Lanaguage Pathology Services during CIR admission    Recommendations  SLP Diet Recommendations: Thin Liquid Administration via:  (PROVALE CUP) Medication Administration: Whole meds with puree Supervision: Patient able to self feed;Intermittent supervision to cue for compensatory strategies Compensations: Minimize environmental distractions;Small sips/bites;Slow rate Postural Changes and/or Swallow Maneuvers: Seated upright 90 degrees Oral Care Recommendations: Oral care BID Patient destination: Home Follow up Recommendations: Outpatient SLP Equipment Recommended: Other (comment) (Provale cup)    SLP Frequency 3 to 5 out of 7 days   SLP Duration  SLP Intensity  SLP Treatment/Interventions 10-12 days  Minumum of 1-2 x/day, 30 to 90 minutes  Cognitive remediation/compensation;Internal/external aids;Dysphagia/aspiration precaution training;Cueing hierarchy;Environmental controls;Therapeutic Activities;DME/adaptive equipment instruction;Functional tasks;Patient/family education    Pain Pain Assessment Pain Scale: 0-10 Pain Score: 0-No pain  Prior Functioning Type of Home: House  Lives With: Friend(s);Other (Comment) Coralyn Mark (caregiver)) Available Help at Discharge: Available PRN/intermittently Vocation: Retired  SLP Evaluation Cognition Overall Cognitive Status: No family/caregiver present to determine baseline cognitive functioning Arousal/Alertness: Awake/alert Orientation Level: Oriented to person;Disoriented to situation;Disoriented to place;Disoriented to time Year: Other  (Comment) (2003) Month: January Day of Week: Incorrect Attention: Sustained Sustained Attention: Impaired Selective Attention: Impaired Selective Attention Impairment: Verbal basic Memory: Impaired Memory Impairment: Storage deficit;Decreased short term memory Decreased Short Term Memory: Verbal basic Immediate Memory Recall: Sock;Blue;Bed Memory Recall Sock: Not able to recall Memory Recall Blue: Not able to recall Memory Recall Bed: Not able to recall Awareness: Impaired Awareness Impairment: Intellectual impairment Problem Solving: Impaired Problem Solving Impairment: Verbal basic Executive Function: Reasoning;Organizing;Sequencing Reasoning: Impaired Sequencing: Impaired Organizing: Impaired Behaviors: Verbal agitation Safety/Judgment: Impaired Comments: Decreased knowledge of current deficits; decreased safety awareness [employer] Scales of Cognitive Functioning: Confused/appropriate  Comprehension Auditory Comprehension Overall Auditory Comprehension: Appears within functional limits for tasks assessed Expression Expression Primary Mode of Expression: Verbal Verbal Expression Overall Verbal Expression: Appears within functional limits for tasks assessed Written Expression Dominant Hand: Right Oral Motor Oral Motor/Sensory Function Overall Oral Motor/Sensory Function: Mild impairment Facial Symmetry: Abnormal symmetry left;Suspected CN VII (facial) dysfunction Motor Speech Overall Motor Speech: Impaired Respiration: Within functional limits Phonation: Wet Resonance: Within functional limits Articulation: Within functional limitis Intelligibility: Intelligible Motor Planning: Witnin functional limits Interfering Components: Inadequate dentition  Care Tool Care Tool Cognition Ability to hear (with hearing aid or hearing appliances if normally used Ability to hear (with hearing aid or hearing appliances if normally used): 0. Adequate - no difficulty in  normal conservation, social interaction, listening to TV   Expression of Ideas and Wants Expression of Ideas and Wants: 3. Some difficulty - exhibits some difficulty with expressing needs and ideas (e.g, some words or finishing thoughts) or speech is not clear   Understanding Verbal and Non-Verbal Content Understanding Verbal and Non-Verbal Content: 3. Usually understands - understands most conversations, but misses some part/intent of message. Requires cues at times to understand  Memory/Recall Ability Memory/Recall Ability : None of the above were recalled    Bedside Swallowing Assessment General    Oral Care Assessment   Ice Chips Ice chips: Not tested Thin Liquid Thin  Liquid: Impaired Presentation: Cup;Straw Oral Phase Functional Implications: Left anterior spillage Pharyngeal  Phase Impairments: Cough - Immediate;Wet Vocal Quality;Throat Clearing - Immediate Nectar Thick Nectar Thick Liquid: Not tested Honey Thick Honey Thick Liquid: Not tested Puree Puree: Not tested Solid Solid: Within functional limits Presentation: Self Fed BSE Assessment Risk for Aspiration Impact on safety and function: Mild aspiration risk Other Related Risk Factors: Cognitive impairment  Short Term Goals: Week 1: SLP Short Term Goal 1 (Week 1): Patient will tolerate current diet with minimal-to-no overt s/sx of aspiration with min A verbal cues for use of swallowing compensatory strategies (provale cup). SLP Short Term Goal 2 (Week 1): Patient will use speech intelligibility strategies to improve speech intelligibility at phrase level given minA verbal cues. SLP Short Term Goal 3 (Week 1): Patient will sustain attention to a given task for 10 minutes with <2 re-directions in a quiet environment. SLP Short Term Goal 4 (Week 1): Patient will demonstrate orientation x4 with minA verbal cues to refer to compensatory aids.  Refer to Care Plan for Long Term Goals  Recommendations for other services:  None   Discharge Criteria: Patient will be discharged from SLP if patient refuses treatment 3 consecutive times without medical reason, if treatment goals not met, if there is a change in medical status, if patient makes no progress towards goals or if patient is discharged from hospital.  The above assessment, treatment plan, treatment alternatives and goals were discussed and mutually agreed upon: by patient  Verdene Lennert 05/10/2021, 12:40 PM

## 2021-05-10 NOTE — Progress Notes (Signed)
Inpatient Rehabilitation Admission Medication Review by a Pharmacist  A complete drug regimen review was completed for this patient to identify any potential clinically significant medication issues.  High Risk Drug Classes Is patient taking? Indication by Medication  Antipsychotic No   Anticoagulant Yes Heparin- DVT ppx   Antibiotic No   Opioid No   Antiplatelet Yes Clopidogrel and aspirin- cerebral infarction  Hypoglycemics/insulin Yes Insulin detemir and insulin aspart- hyperglycemia  Vasoactive Medication Yes Hydralazine and lisinopril: high blood pressure Metoprolol- heart rate control  Chemotherapy No   Other No      Type of Medication Issue Identified Description of Issue Recommendation(s)  Drug Interaction(s) (clinically significant)     Duplicate Therapy     Allergy     No Medication Administration End Date     Incorrect Dose     Additional Drug Therapy Needed     Significant med changes from prior encounter (inform family/care partners about these prior to discharge).    Other  Patient has uncontrolled diabetes (A1c 9.9). Appears patient has previously been on metformin.  Consider restarting metformin and adjusting insulin regimen as appropriate for better blood glucose control    Clinically significant medication issues were identified that warrant physician communication and completion of prescribed/recommended actions by midnight of the next day:  No  Pharmacist comments: None  Time spent performing this drug regimen review (minutes):  [address]   Rushie Goltz 05/10/2021 7:46 AM

## 2021-05-11 DIAGNOSIS — E1169 Type 2 diabetes mellitus with other specified complication: Secondary | ICD-10-CM | POA: Diagnosis not present

## 2021-05-11 DIAGNOSIS — I6330 Cerebral infarction due to thrombosis of unspecified cerebral artery: Secondary | ICD-10-CM | POA: Diagnosis not present

## 2021-05-11 DIAGNOSIS — I10 Essential (primary) hypertension: Secondary | ICD-10-CM

## 2021-05-11 DIAGNOSIS — E669 Obesity, unspecified: Secondary | ICD-10-CM | POA: Diagnosis not present

## 2021-05-11 LAB — CBC WITH DIFFERENTIAL/PLATELET
Abs Immature Granulocytes: 0.03 10*3/uL (ref 0.00–0.07)
Basophils Absolute: 0 10*3/uL (ref 0.0–0.1)
Basophils Relative: 0 %
Eosinophils Absolute: 0.2 10*3/uL (ref 0.0–0.5)
Eosinophils Relative: 3 %
HCT: 31.7 % — ABNORMAL LOW (ref 39.0–52.0)
Hemoglobin: 10 g/dL — ABNORMAL LOW (ref 13.0–17.0)
Immature Granulocytes: 0 %
Lymphocytes Relative: 25 %
Lymphs Abs: 1.8 10*3/uL (ref 0.7–4.0)
MCH: 27.9 pg (ref 26.0–34.0)
MCHC: 31.5 g/dL (ref 30.0–36.0)
MCV: 88.5 fL (ref 80.0–100.0)
Monocytes Absolute: 0.5 10*3/uL (ref 0.1–1.0)
Monocytes Relative: 7 %
Neutro Abs: 4.8 10*3/uL (ref 1.7–7.7)
Neutrophils Relative %: 65 %
Platelets: 296 10*3/uL (ref 150–400)
RBC: 3.58 MIL/uL — ABNORMAL LOW (ref 4.22–5.81)
RDW: 14.9 % (ref 11.5–15.5)
WBC: 7.4 10*3/uL (ref 4.0–10.5)
nRBC: 0 % (ref 0.0–0.2)

## 2021-05-11 LAB — COMPREHENSIVE METABOLIC PANEL
ALT: 37 U/L (ref 0–44)
AST: 50 U/L — ABNORMAL HIGH (ref 15–41)
Albumin: 2.6 g/dL — ABNORMAL LOW (ref 3.5–5.0)
Alkaline Phosphatase: 107 U/L (ref 38–126)
Anion gap: 12 (ref 5–15)
BUN: 19 mg/dL (ref 8–23)
CO2: 27 mmol/L (ref 22–32)
Calcium: 8.7 mg/dL — ABNORMAL LOW (ref 8.9–10.3)
Chloride: 98 mmol/L (ref 98–111)
Creatinine, Ser: 1.12 mg/dL (ref 0.61–1.24)
GFR, Estimated: >60 mL/min (ref >60)
Glucose, Bld: 169 mg/dL — ABNORMAL HIGH (ref 70–99)
Potassium: 4.1 mmol/L (ref 3.5–5.1)
Sodium: 137 mmol/L (ref 135–145)
Total Bilirubin: 0.7 mg/dL (ref 0.3–1.2)
Total Protein: 6.4 g/dL — ABNORMAL LOW (ref 6.5–8.1)

## 2021-05-11 LAB — GLUCOSE, CAPILLARY
Glucose-Capillary: 157 mg/dL — ABNORMAL HIGH (ref 70–99)
Glucose-Capillary: 208 mg/dL — ABNORMAL HIGH (ref 70–99)
Glucose-Capillary: 109 mg/dL — ABNORMAL HIGH (ref 70–99)
Glucose-Capillary: 126 mg/dL — ABNORMAL HIGH (ref 70–99)

## 2021-05-11 MED ORDER — LIVING WELL WITH DIABETES BOOK
Freq: Once | Status: AC
  Filled 2021-05-11: qty 1

## 2021-05-11 NOTE — Progress Notes (Signed)
PROGRESS NOTE   Subjective/Complaints:  Again complains of feeling tired today. Says he slept ok. Just slow to get started.   ROS: Limited due to cognitive/behavioral   Objective:   No results found. Recent Labs    05/09/21 1647 05/11/21 0614  WBC 13.2* 7.4  HGB 10.6* 10.0*  HCT 32.1* 31.7*  PLT 292 296   Recent Labs    05/09/21 0959 05/09/21 1647 05/11/21 0614  NA 135  --  137  K 3.7  --  4.1  CL 99  --  98  CO2 26  --  27  GLUCOSE 259*  --  169*  BUN 16  --  19  CREATININE 1.13 1.27* 1.12  CALCIUM 8.0*  --  8.7*    Intake/Output Summary (Last 24 hours) at 05/11/2021 1149 Last data filed at 05/11/2021 0700 Gross per 24 hour  Intake 1076 ml  Output 250 ml  Net 826 ml     Pressure Injury 11/30/19 Sacrum Medial Stage 2 -  Partial thickness loss of dermis presenting as a shallow open injury with a red, pink wound bed without slough. medial, left/right sacral area (Active)  11/30/19 0800  Location: Sacrum  Location Orientation: Medial  Staging: Stage 2 -  Partial thickness loss of dermis presenting as a shallow open injury with a red, pink wound bed without slough.  Wound Description (Comments): medial, left/right sacral area  Present on Admission: Yes    Physical Exam: Vital Signs Blood pressure (!) 189/86, pulse 65, temperature 97.8 F (36.6 C), temperature source Oral, resp. rate 18, SpO2 100 %.   Constitutional: No distress . Vital signs reviewed. HEENT: NCAT, EOMI, oral membranes moist Neck: supple Cardiovascular: RRR without murmur. No JVD    Respiratory/Chest: CTA Bilaterally without wheezes or rales. Normal effort    GI/Abdomen: BS +, non-tender, non-distended Ext: no clubbing, cyanosis, tr edema Psych: flat Skin: sacral wound Neurologic: pt slowed. Follows basic commands with some delay. Cranial nerves II through XII intact, motor strength is 5/5 in bilateral deltoid, bicep, tricep, grip,  hip flexor, knee extensors, ankle dorsiflexor and plantar flexor Sensory exam normal sensation to light touch and proprioception in bilateral upper and lower extremities Cerebellar exam normal finger to nose to finger as well as heel to shin in bilateral upper and lower extremities Musculoskeletal: Full range of motion in all 4 extremities. No joint swelling   Assessment/Plan: 1. Functional deficits which require 3+ hours per day of interdisciplinary therapy in a comprehensive inpatient rehab setting. Physiatrist is providing close team supervision and 24 hour management of active medical problems listed below. Physiatrist and rehab team continue to assess barriers to discharge/monitor patient progress toward functional and medical goals  Care Tool:  Bathing    Body parts bathed by patient: Left arm, Chest, Abdomen, Front perineal area, Right upper leg, Left upper leg, Face   Body parts bathed by helper: Right arm, Buttocks, Right lower leg, Left lower leg     Bathing assist Assist Level: Moderate Assistance - Patient 50 - 74%     Upper Body Dressing/Undressing Upper body dressing   What is the patient wearing?: Hospital gown only    Upper  body assist Assist Level: Maximal Assistance - Patient 25 - 49%    Lower Body Dressing/Undressing Lower body dressing      What is the patient wearing?: Hospital gown only, Incontinence brief     Lower body assist Assist for lower body dressing: Maximal Assistance - Patient 25 - 49%     Toileting Toileting Toileting Activity did not occur Landscape architect and hygiene only): Refused  Toileting assist Assist for toileting: Maximal Assistance - Patient 25 - 49%     Transfers Chair/bed transfer  Transfers assist  Chair/bed transfer activity did not occur: Refused  Chair/bed transfer assist level: Minimal Assistance - Patient > 75%     Locomotion Ambulation   Ambulation assist      Assist level: Minimal Assistance -  Patient > 75% Assistive device: Walker-rolling Max distance: 50   Walk 10 feet activity   Assist     Assist level: Minimal Assistance - Patient > 75% Assistive device: Walker-rolling   Walk 50 feet activity   Assist Walk 50 feet with 2 turns activity did not occur: Safety/medical concerns  Assist level: Minimal Assistance - Patient > 75% Assistive device: Walker-rolling    Walk 150 feet activity   Assist Walk 150 feet activity did not occur: Safety/medical concerns         Walk 10 feet on uneven surface  activity   Assist Walk 10 feet on uneven surfaces activity did not occur: Safety/medical concerns         Wheelchair     Assist Is the patient using a wheelchair?: Yes Type of Wheelchair: Manual    Wheelchair assist level: Maximal Assistance - Patient 25 - 49% Max wheelchair distance: 20    Wheelchair 50 feet with 2 turns activity    Assist        Assist Level: Maximal Assistance - Patient 25 - 49% (per PT documentation)   Wheelchair 150 feet activity     Assist      Assist Level: Total Assistance - Patient < 25% (per PT documentation)   Blood pressure (!) 189/86, pulse 65, temperature 97.8 F (36.6 C), temperature source Oral, resp. rate 18, SpO2 100 %.  Medical Problem List and Plan: 1. Functional deficits with dizziness and gait abnormality secondary to right PICA infarction 05/02/21  as well as history of left ICH subcortical and right temporal embolic infarct [id number]             -patient may  shower             -ELOS/Goals: 10-12d  -Continue CIR therapies including PT, OT, and SLP   -encouraged pt to participate in therapies 2.  Antithrombotics: -DVT/anticoagulation:  Pharmaceutical: Heparin             -antiplatelet therapy: Aspirin 81 mg daily and Plavix 75 mg day x3 weeks then Plavix alone 3. Pain Management: Tylenol as needed 4. Mood: Provide emotional support             -antipsychotic agents: N/A 5. Neuropsych: This  patient is capable of making decisions on his own behalf. 6. Skin/Wound Care: Routine skin checks 7. Fluids/Electrolytes/Nutrition:  encourage po  I personally reviewed the patient's labs today.   8.  Diabetes mellitus.  Hemoglobin A1c 9.9.  NovoLog 3 units 3 times daily, Levemir  units  daily.    CBG (last 3)  Recent Labs    05/10/21 2057 05/11/21 0612 05/11/21 1142  GLUCAP 187* 157* 208*  -still labile, cover spikes  with SSI for nos 1/16  9.  Hypertension.  Lisinopril 40 mg daily, Toprol-XL 50 mg daily, hydralazine 50 mg every 8 hours, [place] 25 mg daily.  Monitor with increased mobility   Vitals:   05/10/21 1952 05/11/21 0422  BP: (!) 161/89 (!) 189/86  Pulse: 65 65  Resp: 18 18  Temp: 98.7 F (37.1 C) 97.8 F (36.6 C)  SpO2: 98% 100%   Increased toprol to 100mg  qd, effective 1/16--observe for effect 10.  Hyperlipidemia.  Lipitor 11.  History of gout.  Allopurinol.  Monitor for any gout flareups    LOS: 2 days A FACE TO FACE EVALUATION WAS PERFORMED  Meredith Staggers 05/11/2021, 11:49 AM

## 2021-05-11 NOTE — Plan of Care (Signed)
Problem: RH Expression Communication Goal: LTG Patient will increase speech intelligibility (SLP) Description: LTG: Patient will increase speech intelligibility at word/phrase/conversation level with cues, % of the time (SLP) Flowsheets (Taken 05/11/2021 1601) LTG: Patient will increase speech intelligibility (SLP): Supervision Percent of time patient will use intelligible speech: 90

## 2021-05-11 NOTE — Progress Notes (Signed)
Inpatient Rehabilitation  Patient information reviewed and entered into eRehab system by Jamerica Snavely Layla Kesling, OTR/L.   Information including medical coding, functional ability and quality indicators will be reviewed and updated through discharge.    

## 2021-05-11 NOTE — Plan of Care (Signed)
°  Problem: RH Swallowing Goal: LTG Patient will consume least restrictive diet using compensatory strategies with assistance (SLP) Description: LTG:  Patient will consume least restrictive diet using compensatory strategies with assistance (SLP) Flowsheets (Taken 05/11/2021 1556) LTG: Pt Patient will consume least restrictive diet using compensatory strategies with assistance of (SLP): Modified Independent

## 2021-05-11 NOTE — Progress Notes (Signed)
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 Social History:  reports that he quit smoking about 25 years ago. His smoking use included cigarettes. He has a 10.00 pack-year smoking history. He has never used smokeless tobacco. He reports current alcohol use. He reports that he does not use drugs.  Family / Support Systems Marital Status: Separated Patient Roles: Parent, Caregiver, Other (Comment) (ex-wife) Spouse/Significant Other: Patricia-ex-wife [phone] Children: Charity-daughter [phone]  Regina-daughter [phone] Other Supports: Terry-caregiver-whom lives with him Anticipated Caregiver: Coralyn Mark Ability/Limitations of Caregiver: Coralyn Mark lives with pt but at times works part time when he chooses too Careers adviser: 24/7 Family Dynamics: Close with daughter's and ex-wife does come to visit him her while in the hospital. Pt reports he wants to be able to be mobile like he was before this stroke  Social History Preferred language: English Religion: Baptist Cultural Background: No issues Education: [place] - How often do you need to have someone help you when you read instructions, pamphlets, or other written material from your doctor or pharmacy?: Rarely Writes: Yes Employment Status: Retired Public relations account executive Issues: No issues Guardian/Conservator: None-according to MD pt is capable of making his own decisions while here. There is no formal POA in place.   Abuse/Neglect Abuse/Neglect Assessment  Can Be Completed: Yes Physical Abuse: Denies Verbal Abuse: Denies Sexual Abuse: Denies Exploitation of patient/patient's resources: Denies Self-Neglect: Denies  Patient response to: Social Isolation - How often do you feel lonely or isolated from those around you?: Sometimes  Emotional Status Pt's affect, behavior and adjustment status: Pt is motivated to do well and recover from this stroke and get back to moving on his own. He did have help with his bathing and home management prior to this, due to other stroke he had. Recent Psychosocial Issues: other health issuies and being separated from his wife of numerous years Psychiatric History: No history but due to all that has happened to him in the recent couple of years he would benefit from seeing neuro-psych while here. Substance Abuse History: No issues  Patient / Family Perceptions, Expectations & Goals Pt/Family understanding of illness & functional limitations: Pt can explain he had another stroke, it affected his left side. He is moving it and getting more control of it which is encouraging to him. He does talk with the MD and feels he has a good understanding of his plan moving forward. He is glad to be here and not in a NH Premorbid pt/family roles/activities: father, roommate, friend, retiree, etc Anticipated changes in roles/activities/participation: resume Pt/family expectations/goals: Pt states: " I want to be able to move around on my own with a cane or walker."  [employer]: Other (Comment) (been to [place] in 2021 for rehab) Premorbid Home Care/DME Agencies: Other (Comment) (has rw and cane) Transportation available at discharge: Coralyn Mark and family members Is the patient able to respond to transportation needs?: Yes In the past 12 months, has lack of transportation kept you from medical appointments or from getting medications?: Yes In the past 12 months, has lack of  transportation kept  you from meetings, work, or from getting things needed for daily living?: Yes Resource referrals recommended: Neuropsychology  Discharge Planning Living Arrangements: Non-relatives/Friends Control and instrumentation engineer) Support Systems: Spouse/significant other, Children, Other relatives, Friends/neighbors Type of Residence: Private residence Insurance Resources: Multimedia programmer (specify) Personnel officer Medicare) Financial Resources: [place] Referred: No Living Expenses: Own Money Management: Patient Does the patient have any problems obtaining your medications?: No Home Management: Coralyn Mark Patient/Family Preliminary Plans: Return home with Coralyn Mark assisting with his care like he was prior to admission to the hospital. Due to deficits from last CVA in 2021. Discussed rehab process and team conference tomorrow. Will update tomorrow and work on discharge needs. Care Coordinator Barriers to Discharge: Insurance for SNF coverage Care Coordinator Anticipated Follow Up Needs: HH/OP  Clinical Impression Pleasant gentleman who is motivated to improve and regain hs mobility while here. He has a caregiver-Terry who also lives with him. His two daughter's and ex-wife also are supportive. Aware team conference tomorrow will have Auria-primary social worker update. Pt aware this worker is covering for today. Continue to work on discharge needs.  Elease Hashimoto 05/11/2021, 12:31 PM

## 2021-05-11 NOTE — Progress Notes (Addendum)
Speech Language Pathology Daily Session Note  Patient Details  Name: Corell Orloff MRN: [id number] Date of Birth: 10-14-1945  Today's Date: 05/11/2021 SLP Individual Time: [id number] SLP Individual Time Calculation (min): 32 min and Today's Date: 05/11/2021 SLP Missed Time: 13 Minutes Missed Time Reason: Patient unwilling to participate  Short Term Goals: Week 1: SLP Short Term Goal 1 (Week 1): Patient will tolerate current diet with minimal-to-no overt s/sx of aspiration with min A verbal cues for use of swallowing compensatory strategies (provale cup). SLP Short Term Goal 2 (Week 1): Patient will use speech intelligibility strategies to improve speech intelligibility at phrase level given minA verbal cues. SLP Short Term Goal 3 (Week 1): Patient will sustain attention to a given task for 10 minutes with <2 re-directions in a quiet environment. SLP Short Term Goal 4 (Week 1): Patient will demonstrate orientation x4 with minA verbal cues to refer to compensatory aids.  Skilled Therapeutic Interventions: Skilled ST treatment focused on swallowing and cognitive goals. Upon arrival pt requested to be put back in bed and expressed dissatisfaction for sitting in wheelchair for so long. SLP asked if patient had pressed call bell to communicate his needs and he stated he didn't press it because he doesn't want to bother anyone and that he would just get into bed himself. SLP provided encouragement to use call bell system to minimize fall risk. SLP assisted with transfer to bed with min A and verbal cues for impulsivity. Pt remained EOB for discussion on swallowing and therapeutic PO trials however patient required overall max encouragement for participation. Reviewed findings from SLP evaluation yesterday where there was concern for clinical signs of aspiration with thin liquid intake. Pt with no recall of this and adamantly denied changes to swallowing/cognition s/p CVA. Per evaluating SLP pt was  recommended to consume liquids by provale (10cc) cup only, as this eliminated any clinical s/s of aspiration. Pt had a regular cup with straw at bedside despite signs in room reflecting use of provale cup only. SLP placed additional signs behind bed, on pt's door, as well as informed nurse and nurse tech to increase carry over among staff. Pt consumed thin liquids via straw with no immediate s/s of aspiration however exhibited wet vocal quality. Pt refused to performing throat clear or any protective strategies. Patient agreeable to consume thin liquids from provale cup without overt s/s of aspiration and clear vocal quality post swallows. SLP reinforced recommendations for using provale cup explicitly and educated on reasoning. Also recommend intermittent supervision during meals. Pt verbalized understanding. Pt identified safe swallowing strategies with max A. Pt eventually became resistant to participate further and stated "can't you tell I'm bored? Just leave" and "I'm not doing all this therapy anymore." Session concluded 13 minutes early. Patient was left in bed with alarm activated and immediate needs within reach at end of session. Continue per current plan of care.      Pain Pain Assessment Pain Scale: 0-10 Pain Score: 0-No pain  Therapy/Group: Individual Therapy  Patty Sermons 05/11/2021, 11:56 AM

## 2021-05-11 NOTE — Progress Notes (Signed)
Physical Therapy Session Note  Patient Details  Name: Manuel Chapman MRN: [id number] Date of Birth: 30-Oct-1945  Today's Date: 05/11/2021 PT Individual Time: 0900-1015 PT Individual Time Calculation (min): 75 min   Short Term Goals: Week 1:  PT Short Term Goal 1 (Week 1): Pt will initiate stair training PT Short Term Goal 2 (Week 1): Pt will perform STS transfers with CGA PT Short Term Goal 3 (Week 1): Pt will ambulate x 50 ft with LRAD  Skilled Therapeutic Interventions/Progress Updates: Pt presents supine and asleep.  Pt required increased stim to arouse.  Pt w/ incontinence of bladder, soaking through to [place].  Pt rolled side to side to don , perform pericare and doff clean brief.  Pt rolls side to side w/ min A and use of siderail, verbal cueing.  Pt required assist for threading pants over feet, but pt lifts B legs and then pulls up to butt.  Pt attempts bridging but unable to complete pulling pants over hips unless rolling.  Pt transfers right sidelying to sitting w/ mod A and then sat EOB to don sweatshirt, w/ set-up.  Pt transfers sit to stand w/ min A and verbal cueing for hand placement, min A for SPT to w/c.  Pt attempted to wheel but c/o pain to LUE.  Pt wheeled to small gym for time conservation.  New w/c given w/ full arm rests for improved ability to use BUEs.  Pt amb w/ RW x 10' to simulated car transfer (pt unsure how will be D/Cd) to low SUV height.  Pt required max A for LLE to transfer into car, but min A out of car.  Pt requires constant verbal and manual cues for hand placement to improve sit to stand transfers.  Pt wheeled to main gym for stair trial.  Pt negotiated (4) 6" steps w/ B rails w/ max A progressing to mod A as continues, w/ cueing for hand placement and step-to gait pattern.  Pt amb multiple trials of 40-50' w/ min A and RW, verbal cueing for posture, walker management and visual scanning.     Therapy Documentation Precautions:  Precautions Precautions:  Fall Precaution Comments: Reports previous CVA with residual L weakness Restrictions Weight Bearing Restrictions: No General:   Vital Signs:  Pain: Pain Assessment Pain Scale: 0-10 Pain Score: 0-No pain Mobility:      Therapy/Group: Individual Therapy  Lucio Edward 05/11/2021, 11:33 AM

## 2021-05-11 NOTE — Progress Notes (Signed)
Occupational Therapy Session Note  Patient Details  Name: Manuel Chapman MRN: [id number] Date of Birth: 11/01/1945  Today's Date: 05/11/2021 OT Individual Time: 7471-5953 OT Individual Time Calculation (min): 68 min    Short Term Goals: Week 1:  OT Short Term Goal 1 (Week 1): Patient will complete 1/3 parts of toileting task with Min A and LRAD. OT Short Term Goal 2 (Week 1): Patient will don UB clothing with set-up assist in sitting. OT Short Term Goal 3 (Week 1): Patient will don LB clothing with Min A and LRAD. OT Short Term Goal 4 (Week 1): Patient will complete toilet transfer with Min A and LRAD.   Skilled Therapeutic Interventions/Progress Updates:    Pt greeted at time of session reclined bed level sleeping but aroused with verbal cues. Pt with no pain at rest but did have pain in L shoulder later in session, rest breaks PRN. Offered pt toileting at beginning of session and pt stating "I am going right now" and declined toilet. Dependent brief change bed level with pt rolling bed level CGA/Min and chuck pad/pants soiled with urine, assisted with brief change and doffing clothing. Encouraged pt to try to wipe self but declined. Supine > sit Min A and assist threading B feet into new pants, encouraged pt to attempt threading but declined. Sit > stand Min A at RW and donned pants over hips with assist before stand pivot > wheelchair Min A toward L side. Transported room <> 4th floor ortho gym and set up at [facility] on level 2 and pt tolerated approx 1 minute but L shoulder pain too intense, terminated activity. Switched to table top level AAROM with towel slides for shoulder flexion/extension, horizontal abd/adduction, and ER/IR. Pt with pain at beginning of ROM, notable difference at end of task able to reach further and obtain more ROM pain free. With mirror for feedback, pt performing with one hand on each end of towel, reaches in various directions and planes with cues for symmetry and  therapist assist distally at LUE. Transported back to room same as above, wanting to sit up in chair for dinner. Set up alarm on call bell in reach all needs met.   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 Restrictions Weight Bearing Restrictions: No    Therapy/Group: Individual Therapy  Viona Gilmore 05/11/2021, 12:57 PM

## 2021-05-11 NOTE — Discharge Instructions (Addendum)
Inpatient Rehab Discharge Instructions  Sayeed Weatherall [facility] Discharge date and time: No discharge date for patient encounter.   Activities/Precautions/ Functional Status: Activity: activity as tolerated Diet: diabetic diet Wound Care: Routine skin checks Functional status:  ___ No restrictions     ___ Walk up steps independently ___ 24/7 supervision/assistance   ___ Walk up steps with assistance ___ Intermittent supervision/assistance  ___ Bathe/dress independently ___ Walk with walker     _x__ Bathe/dress with assistance ___ Walk Independently    ___ Shower independently ___ Walk with assistance    ___ Shower with assistance ___ No alcohol     ___ Return to work/school ________  Special Instructions: No driving smoking or alcohol    STROKE/TIA DISCHARGE INSTRUCTIONS SMOKING Cigarette smoking nearly doubles your risk of having a stroke & is the single most alterable risk factor  If you smoke or have smoked in the last 12 months, you are advised to quit smoking for your health. Most of the excess cardiovascular risk related to smoking disappears within a year of stopping. Ask you doctor about anti-smoking medications [employer] Quit Line: 1-800-QUIT NOW Free Smoking Cessation Classes [phone]  CHOLESTEROL Know your levels; limit fat & cholesterol in your diet  Lipid Panel     Component Value Date/Time   CHOL 218 (H) 05/06/2021 0559   TRIG 58 05/06/2021 0559   HDL 37 (L) 05/06/2021 0559   CHOLHDL 5.9 05/06/2021 0559   VLDL 12 05/06/2021 0559   LDLCALC 169 (H) 05/06/2021 0559     Many patients benefit from treatment even if their cholesterol is at goal. Goal: Total Cholesterol (CHOL) less than 160 Goal:  Triglycerides (TRIG) less than 150 Goal:  HDL greater than 40 Goal:  LDL (LDLCALC) less than 100   BLOOD PRESSURE American Stroke Association blood pressure target is less that 120/80 mm/Hg  Your discharge blood pressure is:  BP: (!) 189/86 Monitor your blood  pressure Limit your salt and alcohol intake Many individuals will require more than one medication for high blood pressure  DIABETES (A1c is a blood sugar average for last 3 months) Goal HGBA1c is under 7% (HBGA1c is blood sugar average for last 3 months)  Diabetes:   Lab Results  Component Value Date   HGBA1C 9.9 (H) 05/06/2021    Your HGBA1c can be lowered with medications, healthy diet, and exercise. Check your blood sugar as directed by your physician Call your physician if you experience unexplained or low blood sugars.  PHYSICAL ACTIVITY/REHABILITATION Goal is 30 minutes at least 4 days per week  Activity: Increase activity slowly, Therapies: Physical Therapy: Home Health Return to work:  Activity decreases your risk of heart attack and stroke and makes your heart stronger.  It helps control your weight and blood pressure; helps you relax and can improve your mood. Participate in a regular exercise program. Talk with your doctor about the best form of exercise for you (dancing, walking, swimming, cycling).  DIET/WEIGHT Goal is to maintain a healthy weight  Your discharge diet is:  Diet Order             Diet Carb Modified Fluid consistency: Thin; Room service appropriate? No  Diet effective now                   liquids Your height is:    Your current weight is:   Your Body Mass Index (BMI) is:    Following the type of diet specifically designed for you will help  prevent another stroke. Your goal weight range is:   Your goal Body Mass Index (BMI) is 19-24. Healthy food habits can help reduce 3 risk factors for stroke:  High cholesterol, hypertension, and excess weight.  RESOURCES Stroke/Support Group:  Call [phone]   STROKE EDUCATION PROVIDED/REVIEWED AND GIVEN TO PATIENT Stroke warning signs and symptoms How to activate emergency medical system (call 911). Medications prescribed at discharge. Need for follow-up after discharge. Personal risk factors for  stroke. Pneumonia vaccine given: No Flu vaccine given: No My questions have been answered, the writing is legible, and I understand these instructions.  I will adhere to these goals & educational materials that have been provided to me after my discharge from the hospital.       My questions have been answered and I understand these instructions. I will adhere to these goals and the provided educational materials after my discharge from the hospital.  Patient/Caregiver Signature _______________________________ Date __________  Clinician Signature _______________________________________ Date __________  Please bring this form and your medication list with you to all your follow-up doctor's appointments.

## 2021-05-11 NOTE — Progress Notes (Signed)
Inpatient Rehabilitation Center Individual Statement of Services  Patient Name:  Manuel Chapman  Date:  05/11/2021  Welcome to the Inpatient Rehabilitation Center.  Our goal is to provide you with an individualized program based on your diagnosis and situation, designed to meet your specific needs.  With this comprehensive rehabilitation program, you will be expected to participate in at least 3 hours of rehabilitation therapies Monday-Friday, with modified therapy programming on the weekends.  Your rehabilitation program will include the following services:  Physical Therapy (PT), Occupational Therapy (OT), Speech Therapy (ST), 24 hour per day rehabilitation nursing, Neuropsychology, Care Coordinator, Rehabilitation Medicine, Nutrition Services, and Pharmacy Services  Weekly team conferences will be held on Tuesday to discuss your progress.  Your Inpatient Rehabilitation Care Coordinator will talk with you frequently to get your input and to update you on team discussions.  Team conferences with you and your family in attendance may also be held.  Expected length of stay: 2-2.5 weeks  Overall anticipated outcome: supervision with cues  Depending on your progress and recovery, your program may change. Your Inpatient Rehabilitation Care Coordinator will coordinate services and will keep you informed of any changes. Your Inpatient Rehabilitation Care Coordinator's name and contact numbers are listed  below.  The following services may also be recommended but are not provided by the Inpatient Rehabilitation Center:   Home Health Rehabiltiation Services Outpatient Rehabilitation Services    Arrangements will be made to provide these services after discharge if needed.  Arrangements include referral to agencies that provide these services.  Your insurance has been verified to be:  [employer] Your primary doctor is:  Zeyad Delaguila  Pertinent information will be shared with your  doctor and your insurance company.  Inpatient Rehabilitation Care Coordinator:  Susie Cassette [phone] or Salena [phone]  Information discussed with and copy given to patient by: Lucy Chris, 05/11/2021, 12:33 PM

## 2021-05-11 NOTE — Plan of Care (Addendum)
Updated careplan  Provided education and handouts for diabetes, hypertension, prevention of cerebrovascular disease, prevention and controlling cholesterol, and a list of foods that contain higher levels of protein and calcium.

## 2021-05-12 DIAGNOSIS — E1169 Type 2 diabetes mellitus with other specified complication: Secondary | ICD-10-CM | POA: Diagnosis not present

## 2021-05-12 DIAGNOSIS — E669 Obesity, unspecified: Secondary | ICD-10-CM | POA: Diagnosis not present

## 2021-05-12 DIAGNOSIS — I10 Essential (primary) hypertension: Secondary | ICD-10-CM | POA: Diagnosis not present

## 2021-05-12 DIAGNOSIS — I6330 Cerebral infarction due to thrombosis of unspecified cerebral artery: Secondary | ICD-10-CM | POA: Diagnosis not present

## 2021-05-12 LAB — GLUCOSE, CAPILLARY
Glucose-Capillary: 186 mg/dL — ABNORMAL HIGH (ref 70–99)
Glucose-Capillary: 136 mg/dL — ABNORMAL HIGH (ref 70–99)
Glucose-Capillary: 161 mg/dL — ABNORMAL HIGH (ref 70–99)
Glucose-Capillary: 281 mg/dL — ABNORMAL HIGH (ref 70–99)

## 2021-05-12 MED ORDER — METHYLPHENIDATE HCL 5 MG PO TABS
5.0000 mg | ORAL_TABLET | Freq: Two times a day (BID) | ORAL | Status: DC
  Administered 2021-05-12 – 2021-05-19 (×14): 5 mg via ORAL
  Filled 2021-05-12 (×14): qty 1

## 2021-05-12 MED ORDER — MUSCLE RUB 10-15 % EX CREA
TOPICAL_CREAM | Freq: Two times a day (BID) | CUTANEOUS | Status: DC
  Administered 2021-05-20 – 2021-06-27 (×3): 1 via TOPICAL
  Filled 2021-05-12 (×2): qty 85

## 2021-05-12 MED ORDER — TROLAMINE SALICYLATE 10 % EX CREA: TOPICAL_CREAM | Freq: Two times a day (BID) | CUTANEOUS | Status: DC

## 2021-05-12 NOTE — Patient Care Conference (Signed)
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 Faith Rogue Nurse Present: Chana Bode, RN PT Present: Malachi Pro, PT OT Present: Kearney Hard, OT SLP Present: Feliberto Gottron, SLP PPS Coordinator present : Fae Pippin, SLP     Current Status/Progress Goal Weekly Team Focus  Bowel/Bladder     Incontinent of bowel and bladder   Continent   Toileting protocol  Swallow/Nutrition/ Hydration   Reg diet, thin liquids - min A. Pt has been resistant to implementing any modifications/precautions  mod I  implementing swallow strategies, use of Provale cup.   ADL's   Mod/max A for BADL tasks as pt refusing to assist with self-care, min/mod A for transfers  Supervision  Self-care retraining, actvity tolerance, general strengthening, transfers   Mobility   bed mobility supervision with bed features, stand pivot transfer to [facility] with minA, gait 150' with RW  Supervision  Consistent participation, balance, ambulation, transfers   Communication   min A  sup A  speech intelligiblity strategies   Safety/Cognition/ Behavioral Observations  mod A - was resistant to cog focus on Monday so i'm not sure how much support he truly needs right now. 12/30 on SLUMS.  sup-to-min A  orientation, problem solving, attention, emergent awareness   Pain     N/A        Skin     N/A          Discharge Planning:   Return home with [name] assisting with his care like he was prior to admission to the hospital. SW will confirm there are no barriers to discharge.   Team Discussion: Patient is very apathetic post right PICA.  Patient with caregiver PTA; dependent for IADLs and refusing self care and self limiting behaviors.   Patient on target to meet rehab goals: Currently needs min assist for transfers and can ambulate up to 150' with a RW and CGA. Displayed a wet vocal quality at baseline; provale' cup recommended to limit aspiration. Goals for discharge set for supervision overall.  *See Care Plan and progress notes for long and short-term goals.   Revisions to Treatment Plan:  Provale cup recommended Ritalin trial per MD   Teaching Needs: Safety, medication management, transfers, toileting, encourage self care, etc.  Current Barriers to Discharge: Decreased caregiver support, Incontinence, and Behavior  Possible Resolutions to Barriers: Caregiver education Patient has a SPC,  and RW    Medical Summary Current Status: right PICA infarct, persistent fatigue. htn, dm---working on better control.  Barriers to Discharge: Medical stability   Possible Resolutions to [employer] Focus: daily assessment of labs and patient data, maximize arousal/engagement   Continued Need for Acute Rehabilitation Level of Care: The patient requires daily medical management by a physician with specialized training in physical medicine and rehabilitation for the following reasons: Direction of a multidisciplinary physical rehabilitation program to maximize functional independence : Yes Medical management of  patient stability for increased activity during participation in an intensive rehabilitation regime.: Yes Analysis of laboratory values and/or radiology reports with any subsequent need for medication adjustment and/or medical intervention. : Yes   I attest that I was present, lead the team conference, and  concur with the assessment and plan of the team.   Chana Bode B 05/12/2021, 2:13 PM

## 2021-05-12 NOTE — Progress Notes (Signed)
Speech Language Pathology Daily Session Note  Patient Details  Name: Manuel Chapman MRN: [id number] Date of Birth: 1945/07/14  Today's Date: 05/12/2021 SLP Individual Time: 1100-1120 SLP Individual Time Calculation (min): 20 min and Today's Date: 05/12/2021 SLP Missed Time: 10 Minutes Missed Time Reason: Patient unwilling to participate  Short Term Goals: Week 1: SLP Short Term Goal 1 (Week 1): Patient will tolerate current diet with minimal-to-no overt s/sx of aspiration with min A verbal cues for use of swallowing compensatory strategies (provale cup). SLP Short Term Goal 2 (Week 1): Patient will use speech intelligibility strategies to improve speech intelligibility at phrase level given minA verbal cues. SLP Short Term Goal 3 (Week 1): Patient will sustain attention to a given task for 10 minutes with <2 re-directions in a quiet environment. SLP Short Term Goal 4 (Week 1): Patient will demonstrate orientation x4 with minA verbal cues to refer to compensatory aids.  Skilled Therapeutic Interventions: Skilled treatment session focused on dysphagia goals. Upon arrival, patient was awake in bed with family present. Patient initially declined participation but SLP provided education to the patient's family regarding the reasoning for the Provale cup and the importance of use with all liquids. Family verbalized understanding. Patient was able to reposition himself in bed to maximize safety with PO intake. Patient consumed thin liquids via the Provale cup without overt s/s of aspiration. Patient reported intermittent frustration with sip size but agreeable for ongoing use. Patient consumed ~4 sips with encouragement and then verbalized, "bye, go fall in love with someone else." Therefore, patient missed remaining 10 minutes of session. Recommend patient continue with current diet. Patient left upright in bed with alarm on and all needs within reach. Continue with current plan of care.    Pain Patient with a heating pack on left shoulder, did not rate pain   Therapy/Group: Individual Therapy  [name] 05/12/2021, 11:33 AM

## 2021-05-12 NOTE — Progress Notes (Signed)
PROGRESS NOTE   Subjective/Complaints:  Still feels "tired". Hard to motivate him with therapy. Denies any pain. Tells me he's sleeping.   ROS: Limited due to cognitive/behavioral   Objective:   No results found. Recent Labs    05/09/21 1647 05/11/21 0614  WBC 13.2* 7.4  HGB 10.6* 10.0*  HCT 32.1* 31.7*  PLT 292 296   Recent Labs    05/09/21 1647 05/11/21 0614  NA  --  137  K  --  4.1  CL  --  98  CO2  --  27  GLUCOSE  --  169*  BUN  --  19  CREATININE 1.27* 1.12  CALCIUM  --  8.7*    Intake/Output Summary (Last 24 hours) at 05/12/2021 1054 Last data filed at 05/12/2021 6294 Gross per 24 hour  Intake 717 ml  Output --  Net 717 ml     Pressure Injury 11/30/19 Sacrum Medial Stage 2 -  Partial thickness loss of dermis presenting as a shallow open injury with a red, pink wound bed without slough. medial, left/right sacral area (Active)  11/30/19 0800  Location: Sacrum  Location Orientation: Medial  Staging: Stage 2 -  Partial thickness loss of dermis presenting as a shallow open injury with a red, pink wound bed without slough.  Wound Description (Comments): medial, left/right sacral area  Present on Admission: Yes    Physical Exam: Vital Signs Blood pressure (!) 192/98, pulse 66, temperature 98.1 F (36.7 C), resp. rate 18, SpO2 100 %.   Constitutional: No distress . Vital signs reviewed. HEENT: NCAT, EOMI, oral membranes moist Neck: supple Cardiovascular: RRR without murmur. No JVD    Respiratory/Chest: CTA Bilaterally without wheezes or rales. Normal effort    GI/Abdomen: BS +, non-tender, non-distended Ext: no clubbing, cyanosis, or edema Psych: flat, difficult to engage Skin: sacral wound Neurologic: pt slowed but fairly alert.  Follows basic commands with some delay. Cranial nerves II through XII intact, motor strength is 5/5 in bilateral deltoid, bicep, tricep, grip, hip flexor, knee  extensors, ankle dorsiflexor and plantar flexor Sensory exam normal sensation to light touch and proprioception in bilateral upper and lower extremities Cerebellar exam normal finger to nose to finger as well as heel to shin in bilateral upper and lower extremities Musculoskeletal: Full range of motion in all 4 extremities. No joint swelling   Assessment/Plan: 1. Functional deficits which require 3+ hours per day of interdisciplinary therapy in a comprehensive inpatient rehab setting. Physiatrist is providing close team supervision and 24 hour management of active medical problems listed below. Physiatrist and rehab team continue to assess barriers to discharge/monitor patient progress toward functional and medical goals  Care Tool:  Bathing    Body parts bathed by patient: Left arm, Chest, Abdomen, Front perineal area, Right upper leg, Left upper leg, Face   Body parts bathed by helper: Right arm, Buttocks, Right lower leg, Left lower leg     Bathing assist Assist Level: Moderate Assistance - Patient 50 - 74%     Upper Body Dressing/Undressing Upper body dressing   What is the patient wearing?: Hospital gown only    Upper body assist Assist Level: Maximal Assistance -  Patient 25 - 49%    Lower Body Dressing/Undressing Lower body dressing      What is the patient wearing?: Pants     Lower body assist Assist for lower body dressing: Moderate Assistance - Patient 50 - 74%     Toileting Toileting Toileting Activity did not occur Press photographer and hygiene only): Refused  Toileting assist Assist for toileting: Maximal Assistance - Patient 25 - 49%     Transfers Chair/bed transfer  Transfers assist  Chair/bed transfer activity did not occur: Refused  Chair/bed transfer assist level: Minimal Assistance - Patient > 75%     Locomotion Ambulation   Ambulation assist      Assist level: Minimal Assistance - Patient > 75% Assistive device: Walker-rolling Max  distance: 50   Walk 10 feet activity   Assist     Assist level: Minimal Assistance - Patient > 75% Assistive device: Walker-rolling   Walk 50 feet activity   Assist Walk 50 feet with 2 turns activity did not occur: Safety/medical concerns  Assist level: Minimal Assistance - Patient > 75% Assistive device: Walker-rolling    Walk 150 feet activity   Assist Walk 150 feet activity did not occur: Safety/medical concerns         Walk 10 feet on uneven surface  activity   Assist Walk 10 feet on uneven surfaces activity did not occur: Safety/medical concerns         Wheelchair     Assist Is the patient using a wheelchair?: Yes Type of Wheelchair: Manual    Wheelchair assist level: Maximal Assistance - Patient 25 - 49% Max wheelchair distance: 20    Wheelchair 50 feet with 2 turns activity    Assist        Assist Level: Maximal Assistance - Patient 25 - 49% (per PT documentation)   Wheelchair 150 feet activity     Assist      Assist Level: Total Assistance - Patient < 25% (per PT documentation)   Blood pressure (!) 192/98, pulse 66, temperature 98.1 F (36.7 C), resp. rate 18, SpO2 100 %.  Medical Problem List and Plan: 1. 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 Interdisciplinary team conference today to discuss goals, barriers to discharge, and dc planning.   2.  Antithrombotics: -DVT/anticoagulation:  Pharmaceutical: Heparin             -antiplatelet therapy: Aspirin 81 mg daily and Plavix 75 mg day x3 weeks then Plavix alone 3. Pain Management: Tylenol as needed 4. Mood and sleep/wake:    -add ritalin to help with arousal, motivation and processing speed             -antipsychotic  agents: N/A 5. Neuropsych: This patient is capable of making decisions on his own behalf. 6. Skin/Wound Care: Routine skin checks 7. Fluids/Electrolytes/Nutrition:  encourage po  I personally reviewed the patient's labs today.   8.  Diabetes mellitus.  Hemoglobin A1c 9.9.  NovoLog 3 units 3 times daily, Levemir  units  daily.    CBG (last 3)  Recent Labs    05/11/21 1648 05/11/21 2030 05/12/21 0542  GLUCAP 109* 126* 186*  1/17 cbg's have leveled out a bit. No changes today 9.  Hypertension.  Lisinopril 40 mg daily, Toprol-XL 50 mg daily, hydralazine 50 mg every 8 hours, Hygroton 25 mg daily.  Monitor with increased mobility   Vitals:   05/11/21 2004 05/12/21 0427  BP: (!) 141/67 (!) 192/98  Pulse: 64 66  Resp: 18 18  Temp: 97.7 F (36.5 C) 98.1 F (36.7 C)  SpO2: 99% 100%   Increased toprol to 100mg  qd, effective 1/16--may need an addnl agent 10.  Hyperlipidemia.  Lipitor 11.  History of gout.  Allopurinol.  Monitor for any gout flareups    LOS: 3 days A FACE TO FACE EVALUATION WAS PERFORMED  Ranelle OysterZachary T Agata Lucente 05/12/2021, 10:54 AM

## 2021-05-12 NOTE — Progress Notes (Signed)
Physical Therapy Session Note  Patient Details  Name: Manuel Chapman MRN: [id number] Date of Birth: 01/13/1946  Today's Date: 05/12/2021 PT Individual Time: 4825-0037 PT Individual Time Calculation (min): 54 min   Short Term Goals: Week 1:  PT Short Term Goal 1 (Week 1): Pt will initiate stair training PT Short Term Goal 2 (Week 1): Pt will perform STS transfers with CGA PT Short Term Goal 3 (Week 1): Pt will ambulate x 50 ft with LRAD  Skilled Therapeutic Interventions/Progress Updates:     Pt received supine asleep in bed. PT rouses pt and he is initially attempting to go back to sleep, but able to be convinced to participate in therapy. No complaint of pain. Pt performs supine to sit with CGA/minA and cues for sequencing and positioning. Pt performs stand pivot transfer from bed to [facility] with HHA and cues for body mechanics and hand placement. PT asks pt if he needs to do anything prior to leaving room and pt says "No". WC transport to gym for time management. Pt performs sit to stand to RW with minA and is noted to have been incontinent of urine in seat of WC. Pt ambulates x175' with RW and CGA, with cues for upright gaze to improve posture and balance. Pt transfers to toilet with cues for sequencing and positioning and minA to control eccentrically for increased strengthening and safety. PT provides assistance to don clean brief and pt performs minimal amount of anterior pericare with cueing. Sit to stand with CGA and cues for hand placement and use of grab bars. Pt ambulates 150' back to [facility] with CGA and RW, with continued cueing for upright gaze as pt tends to quickly revert to looking down at ground. Following seated rest break, pt ambulates additional 100'. WC transport back to room. MinA for stand step transfer back to bed with HHA. Sit to supine with cues for sequencing. Pt left supine with alarm intact and all needs within reach. RN informed that pt would benefit form timed toileting  schedule.  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 Restrictions Weight Bearing Restrictions: No  Therapy/Group: Individual Therapy  [name], PT, DPT 05/12/2021, 3:16 PM

## 2021-05-12 NOTE — Progress Notes (Signed)
Occupational Therapy Note  Patient Details  Name: Manuel Chapman MRN: [id number] Date of Birth: November 02, 1945  Today's Date: 05/12/2021 OT Missed Time: 60 Minutes Missed Time Reason: Patient unwilling/refused to participate without medical reason  Patient greeted asleep in bed upon OT arrival. OT able to wake patient, but he refused participating in therapy stating " I already did occupational therapy today, I'm not doing it again." Pt closed eyes again. OT provided encouragement and offered options for therapy and what to work on, but patient continued to refuse.  OT to follow up per plan of care.    Merlene Laughter Latania Bascomb 05/12/2021, 2:10 PM

## 2021-05-12 NOTE — Progress Notes (Signed)
Occupational Therapy Session Note  Patient Details  Name: Manuel Chapman MRN: [id number] Date of Birth: [birth year]  Today's Date: 05/12/2021 OT Individual Time: 1005-1045 OT Individual Time Calculation (min): 40 min    Short Term Goals: Week 1:  OT Short Term Goal 1 (Week 1): Patient will complete 1/3 parts of toileting task with Min A and LRAD. OT Short Term Goal 2 (Week 1): Patient will don UB clothing with set-up assist in sitting. OT Short Term Goal 3 (Week 1): Patient will don LB clothing with Min A and LRAD. OT Short Term Goal 4 (Week 1): Patient will complete toilet transfer with Min A and LRAD.  Skilled Therapeutic Interventions/Progress Updates:  Skilled OT intervention completed with focus on ADL retraining, activity tolerance and pain management. Pt received supine in bed, asleep. Pt initially declining therapy participation, however agreeable to donn pants. Pt completed bed mobility with min A with bed rail/hand held assist. Seated EOB, pt able to assist in holding pants with R hand and lift BLEs to thread into pants. Pt required CGA for dynamic sitting balance during lean forward. Pt stating "I don't want to stand to put pants on, I'm tired" with pt provided education on ease of task from standing vs laying back down then pt agreeable. Sit > stand with CGA using RW, with pt donning pants with CGA for balance only. Sitting EOB, pt expressing pain in L shoulder, with therapist checking in with LPN to ask, per pt, for pain cream. Pt completed bed mobility for EOB > sidelying with min A for BLE management. Therapist applied 2 hot packs, one on anterior shoulder and one posterior shoulder, with ace wrap wrapped around shoulder to secure hot packs in place. Pt verbalizing that hot pack was not too hot, and therapist instructed pt to take it off or ask for assist if temp gets too hot. Pt receptive. Pt able to assist in scooting up in bed with mod A, with pt left upright in bed, with bed  alarm on, family in room and all needs in reach.    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 Restrictions Weight Bearing Restrictions: No  Pain: Unrated pain in L shoulder, applied hot packs, LPN aware   Therapy/Group: Individual Therapy  Manuel Chapman E Alany Borman 05/12/2021, 7:44 AM

## 2021-05-12 NOTE — IPOC Note (Signed)
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[facility])     Functional Problem List: Nursing Bladder, Edema, Endurance, Medication Management, Perception, Safety  PT Balance, Perception, Safety, Edema, Sensory, Endurance, Motor  OT Balance, Cognition, Endurance, Motor, Safety, Sensory  SLP Cognition, Safety  TR         Basic ADLs: OT Grooming, Bathing, Dressing, Toileting     Advanced  ADLs: OT       Transfers: PT Bed Mobility, Bed to Chair, Customer service manager, Tub/Shower     Locomotion: PT Ambulation, Wheelchair Mobility, Stairs     Additional Impairments: OT Fuctional Use of Upper Extremity  SLP Swallowing, Social Cognition   Problem Solving, Memory, Attention, Awareness  TR      Anticipated Outcomes Item Anticipated Outcome  Self Feeding N/A  Swallowing  indep   Basic self-care  S to Min A  Toileting  Min A   Bathroom Transfers S to [employer] A  Bowel/Bladder  Mod I  Transfers  supervision  Locomotion  supervision short distance gait  Communication     Cognition  modA  Pain  pain less than 2  Safety/Judgment  Mod I   Therapy Plan: PT Intensity: Minimum of 1-2 x/day ,45 to 90 minutes PT Frequency: 5 out of 7 days PT Duration Estimated Length of Stay: 2-2.5 weeks OT Intensity: Minimum of 1-2 x/day, 45 to 90 minutes OT Frequency: 5 out of 7 days OT Duration/Estimated Length of Stay: 2-2.5 weeks SLP Intensity: Minumum of 1-2 x/day, 30 to 90 minutes SLP Frequency: 3 to 5 out of 7 days SLP Duration/Estimated Length of Stay: 10-12 days   Due to the current state of emergency, patients may not be receiving their 3-hours of Medicare-mandated therapy.   Team Interventions: Nursing Interventions Patient/Family Education, Psychosocial Support, Bladder Management, Disease  Management/Prevention, Medication Management, Cognitive Remediation/Compensation, Discharge Planning  PT interventions Ambulation/gait training, Cognitive remediation/compensation, Discharge planning, DME/adaptive equipment instruction, Functional mobility training, Pain management, Psychosocial support, Splinting/orthotics, Therapeutic Activities, UE/LE Strength taining/ROM, Visual/perceptual remediation/compensation, Wheelchair propulsion/positioning, UE/LE Coordination activities, Therapeutic Exercise, Stair training, Skin care/wound management, Patient/family education, Neuromuscular re-education, Functional electrical stimulation, Disease management/prevention, Firefighter, Warden/ranger  OT Interventions Warden/ranger, Cognitive remediation/compensation, Firefighter, Discharge planning, DME/adaptive equipment instruction, Functional electrical stimulation, Functional mobility training, Neuromuscular re-education, Patient/family education, Psychosocial support, Self Care/advanced ADL retraining, Therapeutic Activities, Therapeutic Exercise, UE/LE Strength taining/ROM, UE/LE Coordination activities, Wheelchair propulsion/positioning  SLP Interventions Cognitive remediation/compensation, Internal/external aids, Dysphagia/aspiration precaution training, Financial trader, Environmental controls, Therapeutic Activities, DME/adaptive equipment instruction, Functional tasks, Patient/family education  TR Interventions    SW/CM Interventions Discharge Planning, Psychosocial Support, Patient/Family Education   Barriers to Discharge MD  Medical stability  Nursing Home environment access/layout, Behavior    PT Inaccessible home environment, Decreased caregiver support, Home environment access/layout, Incontinence, Lack of/limited family support, Weight    OT Inaccessible home environment, Decreased caregiver support    SLP      SW Insurance for SNF  coverage     Team Discharge Planning: Destination: PT-Home ,OT- Home , SLP-Home Projected Follow-up: PT-Home health PT, OT-  Home health OT, SLP-Outpatient SLP Projected Equipment Needs: PT-To be determined, OT- To be determined, SLP-Other (comment) (Provale cup) Equipment Details: PT-likely RW and w/c, unsure what pt has (questionable historian), OT-  Patient/family involved in discharge planning: PT- Patient,  OT-Patient, SLP-Patient  MD ELOS:  10 to 12 days Medical Rehab Prognosis:  Good Assessment:  76 year old right-handed male with history of hypertension, diabetes mellitus, 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  Per chart review patient currently lives with a friend.  He is married but currently separated.  Patient does have good family support.  Patient was able to feed himself dress groom and toilet himself but does have a sedentary lifestyle.  Caregiver/friend does assist with some meal preparation and medication management.  Presented 05/05/2021 with dizziness and vomiting x3 days.  CT/MRI showed large region of acute/early subacute infarct affecting the inferior cerebellum on the right consistent with right PICA territory infarction.  Petechial blood products but no frank hematoma.  Mass-effect upon the fourth ventricle without evidence of obstruction.  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  No large vessel occlusion or hemodynamically significant stenosis.  Admission chemistry unremarkable except glucose 300, hemoglobin 10.5, WBC 11,400, urine drug screen negative, hemoglobin A1c 9.9.  Echocardiogram with ejection fraction of 45 to 50% grade 2 diastolic dysfunction.  Neurology follow-up maintained on aspirin and Plavix DAPT for 3 weeks then  Plavix alone.  Patient declined loop recorder but plan 30-day cardiac event monitor as outpatient.  Subcutaneous heparin for DVT prophylaxis.       See Team Conference Notes for weekly updates to the plan of care

## 2021-05-12 NOTE — Progress Notes (Signed)
Patient ID: Manuel Chapman, male   DOB: 03-May-1945, 76 y.o.   MRN: [id number]  SW met with pt in room to provide updates from team conference, and d/c date 1/24. Pt reports that he spoke with his dtr and she said  that he could not return home and was very tearful. SW shared will f/u with his dtr Manuel Chapman to discuss further.   *SW spoke with [name] to provide updates and discuss d/c plan. She confirms pt cannot return home due to his home going into foreclosure as his dtr (from another marriage) has not made any payments since August 2022. Plans ot meet tomorrow to discuss further. SW shared with pt that we will meet tomorrow to discuss a plan.   Loralee Pacas, MSW, [place] Office: [phone] Cell: [phone] Fax: [phone]

## 2021-05-13 DIAGNOSIS — E669 Obesity, unspecified: Secondary | ICD-10-CM | POA: Diagnosis not present

## 2021-05-13 DIAGNOSIS — E1169 Type 2 diabetes mellitus with other specified complication: Secondary | ICD-10-CM | POA: Diagnosis not present

## 2021-05-13 DIAGNOSIS — I10 Essential (primary) hypertension: Secondary | ICD-10-CM | POA: Diagnosis not present

## 2021-05-13 DIAGNOSIS — I6330 Cerebral infarction due to thrombosis of unspecified cerebral artery: Secondary | ICD-10-CM | POA: Diagnosis not present

## 2021-05-13 LAB — GLUCOSE, CAPILLARY
Glucose-Capillary: 223 mg/dL — ABNORMAL HIGH (ref 70–99)
Glucose-Capillary: 147 mg/dL — ABNORMAL HIGH (ref 70–99)
Glucose-Capillary: 132 mg/dL — ABNORMAL HIGH (ref 70–99)
Glucose-Capillary: 161 mg/dL — ABNORMAL HIGH (ref 70–99)

## 2021-05-13 MED ORDER — INSULIN DETEMIR 100 UNIT/ML ~~LOC~~ SOLN
5.0000 [IU] | Freq: Every day | SUBCUTANEOUS | Status: DC
  Administered 2021-05-13 – 2021-05-14 (×2): 5 [IU] via SUBCUTANEOUS
  Filled 2021-05-13 (×3): qty 0.05

## 2021-05-13 MED ORDER — HYDRALAZINE HCL 50 MG PO TABS
50.0000 mg | ORAL_TABLET | Freq: Four times a day (QID) | ORAL | Status: DC
  Administered 2021-05-13 – 2021-06-15 (×132): 50 mg via ORAL
  Filled 2021-05-13 (×132): qty 1

## 2021-05-13 NOTE — Progress Notes (Signed)
Patient ID: Manuel Chapman, male   DOB: [birth year], 76 y.o.   MRN: [id number]  SW met with pt, [name], and pt ex-wife (separated for 30+yrs but still married on paper) to discuss discharge plan. SW discussed current barrier with going home. Dtr Manuel Chapman continues to report that his home will be foreclosed on and there is no help from his other children to assist with locating resources to help pay his current debts (mortgage, gas, and electric). States she is currently working with the bank in which they are establishing a new account for him. SW discussed transition into SNF and discussed whether it will be short term or long term. Still undecided. SW shared the barriers that may be faced with depending on plan as there will be limited beds available. SW provided [name] SNF list for review and will f/u on Friday to get preference. SW encouraged family to speak with [employer] moving forward.   Pt has existing NCPASRR #. SW sent out SNF referral.   Loralee Pacas, MSW, [place] Office: [phone] Cell: [phone] Fax: [phone]

## 2021-05-13 NOTE — Progress Notes (Signed)
PROGRESS NOTE   Subjective/Complaints:  Pt upset at situation at home. Seems to have slept last night  ROS: Limited due to cognitive/behavioral    Objective:   No results found. Recent Labs    05/11/21 0614  WBC 7.4  HGB 10.0*  HCT 31.7*  PLT 296   Recent Labs    05/11/21 0614  NA 137  K 4.1  CL 98  CO2 27  GLUCOSE 169*  BUN 19  CREATININE 1.12  CALCIUM 8.7*    Intake/Output Summary (Last 24 hours) at 05/13/2021 0901 Last data filed at 05/13/2021 0800 Gross per 24 hour  Intake 425 ml  Output --  Net 425 ml     Pressure Injury 11/30/19 Sacrum Medial Stage 2 -  Partial thickness loss of dermis presenting as a shallow open injury with a red, pink wound bed without slough. medial, left/right sacral area (Active)  11/30/19 0800  Location: Sacrum  Location Orientation: Medial  Staging: Stage 2 -  Partial thickness loss of dermis presenting as a shallow open injury with a red, pink wound bed without slough.  Wound Description (Comments): medial, left/right sacral area  Present on Admission: Yes    Physical Exam: Vital Signs Blood pressure (!) 189/91, pulse 66, temperature 97.6 F (36.4 C), resp. rate 18, SpO2 100 %.   Constitutional: No distress . Vital signs reviewed. HEENT: NCAT, EOMI, oral membranes moist Neck: supple Cardiovascular: RRR without murmur. No JVD    Respiratory/Chest: CTA Bilaterally without wheezes or rales. Normal effort    GI/Abdomen: BS +, non-tender, non-distended Ext: no clubbing, cyanosis, or edema Psych: pleasant, still slowed Skin: sacral wound Neurologic: pt slowed but fairly alert.  Follows basic commands with some delay. Cranial nerves II through XII intact, motor strength is 5/5 in bilateral deltoid, bicep, tricep, grip, hip flexor, knee extensors, ankle dorsiflexor and plantar flexor Sensory exam normal sensation to light touch and proprioception in bilateral upper and  lower extremities Cerebellar exam normal finger to nose to finger as well as heel to shin in bilateral upper and lower extremities Musculoskeletal: left shoulder tender at acromium with ER/IR   Assessment/Plan: 1. Functional deficits which require 3+ hours per day of interdisciplinary therapy in a comprehensive inpatient rehab setting. Physiatrist is providing close team supervision and 24 hour management of active medical problems listed below. Physiatrist and rehab team continue to assess barriers to discharge/monitor patient progress toward functional and medical goals  Care Tool:  Bathing    Body parts bathed by patient: Left arm, Chest, Abdomen, Front perineal area, Right upper leg, Left upper leg, Face   Body parts bathed by helper: Right arm, Buttocks, Right lower leg, Left lower leg     Bathing assist Assist Level: Moderate Assistance - Patient 50 - 74%     Upper Body Dressing/Undressing Upper body dressing   What is the patient wearing?: Hospital gown only    Upper body assist Assist Level: Maximal Assistance - Patient 25 - 49%    Lower Body Dressing/Undressing Lower body dressing      What is the patient wearing?: Pants     Lower body assist Assist for lower body dressing: Moderate Assistance -  Patient 50 - 74%     Toileting Toileting Toileting Activity did not occur Press photographer and hygiene only): Refused  Toileting assist Assist for toileting: Maximal Assistance - Patient 25 - 49%     Transfers Chair/bed transfer  Transfers assist  Chair/bed transfer activity did not occur: Refused  Chair/bed transfer assist level: Minimal Assistance - Patient > 75%     Locomotion Ambulation   Ambulation assist      Assist level: Minimal Assistance - Patient > 75% Assistive device: Walker-rolling Max distance: 50   Walk 10 feet activity   Assist     Assist level: Minimal Assistance - Patient > 75% Assistive device: Walker-rolling   Walk 50  feet activity   Assist Walk 50 feet with 2 turns activity did not occur: Safety/medical concerns  Assist level: Minimal Assistance - Patient > 75% Assistive device: Walker-rolling    Walk 150 feet activity   Assist Walk 150 feet activity did not occur: Safety/medical concerns         Walk 10 feet on uneven surface  activity   Assist Walk 10 feet on uneven surfaces activity did not occur: Safety/medical concerns         Wheelchair     Assist Is the patient using a wheelchair?: Yes Type of Wheelchair: Manual    Wheelchair assist level: Maximal Assistance - Patient 25 - 49% Max wheelchair distance: 20    Wheelchair 50 feet with 2 turns activity    Assist        Assist Level: Maximal Assistance - Patient 25 - 49% (per PT documentation)   Wheelchair 150 feet activity     Assist      Assist Level: Total Assistance - Patient < 25% (per PT documentation)   Blood pressure (!) 189/91, pulse 66, temperature 97.6 F (36.4 C), resp. rate 18, SpO2 100 %.  Medical Problem List and Plan: 1. 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  Antithrombotics: -DVT/anticoagulation:  Pharmaceutical: Heparin             -antiplatelet therapy: Aspirin 81 mg daily and Plavix 75 mg day x3 weeks then Plavix alone 3. Pain Management: Tylenol as needed 4. Mood and sleep/wake:    -add ritalin to help with arousal, motivation and processing speed             -antipsychotic agents: N/A 5. Neuropsych: This patient is capable of making decisions on his own behalf. 6. Skin/Wound Care: Routine skin checks 7. Fluids/Electrolytes/Nutrition:  encourage po  Push po   8.  Diabetes mellitus.  Hemoglobin A1c 9.9.  NovoLog 3 units 3  times daily, Levemir  units  daily.    CBG (last 3)  Recent Labs    05/12/21 1652 05/12/21 2050 05/13/21 0525  GLUCAP 161* 281* 223*    1/18 cbg's still not controlled, will add pm levemir dose today to put him at 5u bid 9.  Hypertension.  Lisinopril 40 mg daily, Toprol-XL 50 mg daily, hydralazine 50 mg every 8 hours, [place] 25 mg daily.  Monitor with increased mobility   Vitals:   05/12/21 2023 05/13/21 0502  BP: (!) 172/66 Marland Kitchen)  189/91  Pulse: 65 66  Resp: 18 18  Temp: 97.6 F (36.4 C) 97.6 F (36.4 C)  SpO2: 96% 100%   Increased toprol to 100mg  qd, bp's elevated still. Increase hydralazine to qid 10.  Hyperlipidemia.  Lipitor 11.  History of gout.  Allopurinol.  Monitor for any gout flareups    LOS: 4 days A FACE TO FACE EVALUATION WAS PERFORMED  Manuel Chapman 05/13/2021, 9:01 AM

## 2021-05-13 NOTE — Progress Notes (Signed)
Physical Therapy Session Note  Patient Details  Name: Manuel Chapman MRN: [id number] Date of Birth: 11/11/1945  Today's Date: 05/13/2021 PT Individual Time: 1018-1059 PT Individual Time Calculation (min): 41 min   Short Term Goals: Week 1:  PT Short Term Goal 1 (Week 1): Pt will initiate stair training PT Short Term Goal 2 (Week 1): Pt will perform STS transfers with CGA PT Short Term Goal 3 (Week 1): Pt will ambulate x 50 ft with LRAD  Skilled Therapeutic Interventions/Progress Updates:     Pt received seated up in [facility] and agrees to therapy. No complaint of pain. WC transport to gym for time management. Pt performs stand step transfer to Nustep with HHA minA and cues for trunk extension, sequencing, and hand placement for safety. Pt completes Nustep for NM feedback through L hemibody, reciprocal coordination, and strength and endurance training. Pt completes x10:00 at workload 5. PT provides cueing to focus pt's attention on L hand for visual feedback and improved grip and activation of L upper extremity.  Following seated rest break, pt performs stand step transfer back to [facility] with RW and minA, with cues to increase eccentric control of stand to sit for safety and strengthening. Pt ambulates x150' with RW and CGA, with pt having to make frequent adjustment to path of RW due to tendency to veer to the R. PT cues pt to decrease WB through RW to for improved mechanics and for energy conservation.   Pt encouraged to sit up in [facility] following therapy for improving core strength and endurance. Pt agreeable. Left seated in WC with alarm intact and all needs within reach.  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 Restrictions Weight Bearing Restrictions: No   Therapy/Group: Individual Therapy  Breck Coons, PT, DPT 05/13/2021, 12:24 PM

## 2021-05-13 NOTE — Progress Notes (Signed)
Speech Language Pathology Daily Session Note  Patient Details  Name: Manuel Chapman MRN: [id number] Date of Birth: 02-Apr-1946  Today's Date: 05/13/2021 SLP Individual Time: 0730-0810 SLP Individual Time Calculation (min): 40 min  Short Term Goals: Week 1: SLP Short Term Goal 1 (Week 1): Patient will tolerate current diet with minimal-to-no overt s/sx of aspiration with min A verbal cues for use of swallowing compensatory strategies (provale cup). SLP Short Term Goal 2 (Week 1): Patient will use speech intelligibility strategies to improve speech intelligibility at phrase level given minA verbal cues. SLP Short Term Goal 3 (Week 1): Patient will sustain attention to a given task for 10 minutes with <2 re-directions in a quiet environment. SLP Short Term Goal 4 (Week 1): Patient will demonstrate orientation x4 with minA verbal cues to refer to compensatory aids.  Skilled Therapeutic Interventions: Skilled treatment session focused on dysphagia and cognitive goals. Upon arrival, patient was asleep in bed and both his brief and bed were soaked with minimal awareness. Patient agreeable to transfer to the wheelchair to clean his bed. Patient sat EOB with encouragement needed to attempt prior to SLP requiring assistance. Patient transferred to the wheelchair with the RW with Mod verbal cues for problem solving and safety. Patient verbose and tangential while self-feeding but able to alternate attention between self-feeding and conversation with supervision level verbal cues. Patient with decreased intelligibility but not receptive to cues. Patient consumed regular textures with thin liquids with intermittent coughing when taking large sips from a cup. Coughing eliminated with Provale cup but patient continues to report he does not like the cup. SLP provided encouragement. Recommend patient continue current diet with use of Provale cup. Patient left upright in wheelchair with alarm on and all needs  within reach. Continue with current plan of care.      Pain Pain Assessment Pain Scale: Faces Pain Score: 2  Faces Pain Scale: Hurts a little bit Pain Type: Chronic pain Pain Location: Shoulder Pain Orientation: Left Pain Intervention(s): Medication (See eMAR)  Therapy/Group: Individual Therapy  Filicia Scogin 05/13/2021, 11:55 AM

## 2021-05-13 NOTE — Plan of Care (Signed)
°  Problem: RH Cognition - SLP Goal: RH LTG Patient will demonstrate orientation with cues Description:  LTG:  Patient will demonstrate orientation to person/place/time/situation with cues (SLP)   Flowsheets (Taken 05/13/2021 0622) LTG: Patient will demonstrate orientation using cueing (SLP): Supervision Note: Goal downgraded due to intermittent participation    Problem: RH Problem Solving Goal: LTG Patient will demonstrate problem solving for (SLP) Description: LTG:  Patient will demonstrate problem solving for basic/complex daily situations with cues  (SLP) Flowsheets (Taken 05/13/2021 0622) LTG: Patient will demonstrate problem solving for (SLP): Basic daily situations LTG Patient will demonstrate problem solving for: Minimal Assistance - Patient > 75% Note: [place] downgraded due to intermittent participation    Problem: RH Attention Goal: LTG Patient will demonstrate this level of attention during functional activites (SLP) Description: LTG:  Patient will will demonstrate this level of attention during functional activites (SLP) Flowsheets (Taken 05/13/2021 0622) Patient will demonstrate during cognitive/linguistic activities the attention type of: Sustained LTG: Patient will demonstrate this level of attention during cognitive/linguistic activities with assistance of (SLP): Minimal Assistance - Patient > 75% Note: Goal downgraded due to intermittent participation    Problem: RH Expression Communication Goal: LTG Patient will increase speech intelligibility (SLP) Description: LTG: Patient will increase speech intelligibility at word/phrase/conversation level with cues, % of the time (SLP) Flowsheets (Taken 05/13/2021 0622) LTG: Patient will increase speech intelligibility (SLP): Minimal Assistance - Patient > 75% Note: Goal downgraded due to intermittent participation

## 2021-05-13 NOTE — Progress Notes (Signed)
Occupational Therapy Session Note  Patient Details  Name: Manuel Chapman MRN: [id number] Date of Birth: 1946/01/11  Today's Date: 05/13/2021 OT Individual Time: 1015-1055 OT Individual Time Calculation (min): 40 min  and Today's Date: 05/13/2021 OT Missed Time: 20 Minutes Missed Time Reason: Other (comment) (2/2 previous pt care)   Short Term Goals: Week 1:  OT Short Term Goal 1 (Week 1): Patient will complete 1/3 parts of toileting task with Min A and LRAD. OT Short Term Goal 2 (Week 1): Patient will don UB clothing with set-up assist in sitting. OT Short Term Goal 3 (Week 1): Patient will don LB clothing with Min A and LRAD. OT Short Term Goal 4 (Week 1): Patient will complete toilet transfer with Min A and LRAD.   Skilled Therapeutic Interventions/Progress Updates:    Pt greeted at time of session 15 minutes late 2/2 previous pt care. Pt up in wheelchair from previous therapy session and requesting to go to bed at beginning of session but told pt he needed to first do OT session. Pt very self limiting throughout entire session needing encouragement for all tasks. Set up at sink and performed UB bathing supervision, encouraged to wash periarea and buttocks but pt declined stating they "cleaned him up this morning" and refusing to bathe LB. Donned new gown with Min A and pants with Min A to thread LLE (cues for hemitechniques throughout) and able to thread RLE with cues. Sit > stand to don over hips and stand pivot to bed CGA. Remainder of session focused on LUE AAROM/PROM with scapular retractions and mobilizations, pain with most movements despite in gravity eliminated plane and very gentle ROM. Note encouraged pt to participate in entire session with limited motivation. Pt in bed resting alarm on call bell in reach.  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 Restrictions Weight Bearing  Restrictions: No     Therapy/Group: Individual Therapy  Erasmo Score 05/13/2021, 7:26 AM

## 2021-05-13 NOTE — Progress Notes (Signed)
Occupational Therapy Session Note  Patient Details  Name: Manuel Chapman MRN: [id number] Date of Birth: 08-29-1945  Today's Date: 05/13/2021 OT Individual Time: 1406-1500 OT Individual Time Calculation (min): 54 min    Short Term Goals: Week 1:  OT Short Term Goal 1 (Week 1): Patient will complete 1/3 parts of toileting task with Min A and LRAD. OT Short Term Goal 2 (Week 1): Patient will don UB clothing with set-up assist in sitting. OT Short Term Goal 3 (Week 1): Patient will don LB clothing with Min A and LRAD. OT Short Term Goal 4 (Week 1): Patient will complete toilet transfer with Min A and LRAD.  Skilled Therapeutic Interventions/Progress Updates:    Pt in bed to start session agreeable to participate in therapy.  Had him roll side to side with supervision with no report of dizziness.  He reports dizziness only with transition to sitting from supine.  Noted pt with soaked brief, pants, and bed sheets while rolling.  Focused next part of session on cleaning up and donning new clothing.  Min assist for transition to sitting on the right side of the bed.  He then worked on removing his soiled brief and pants sit to stand with min assist.  Noted pt with decreased ability to reach down to his feet to assist with taking clothing off or when donning it.  He reports having reacher at home when asked that he would use, or he would get his friend to help him.  He was able to stand with min assist to wash his front peri area, but needed max assist for the back secondary to bowel incontinence.  Pt without awareness that this had happened in his brief.  He reports not being able to reach back there and complete this prior to this hospitalization.  May benefit from toilet aide trial and education.  Mod assist for donning brief and paper scrubs over his feet with min assist to stand and pull them up over his hips.  He then donned a new shirt as well with setup only.  He agreed to sit up in the  recliner for a while and completed ambulation over to it at min guard assist using the RW for support.  No report of dizziness with standing or with mobility.  Finished session with pt resting in the recliner with the call button and phone in reach as well as safety alarm belt in place.    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

## 2021-05-13 NOTE — NC FL2 (Signed)
[place] MEDICAID FL2 LEVEL OF CARE SCREENING TOOL     IDENTIFICATION  Patient Name: Manuel Chapman Birthdate: 26-Jul-1945 Sex: male Admission Date (Current Location): 05/09/2021  [facility] and [place] Number:  Producer, television/film/video and Address:  [facility], 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   Acute CVA (cerebrovascular accident) (HCC) 05/05/2021   Pressure injury of skin 11/30/2019   Hyperglycemia    Hypokalemia    Acute blood loss anemia    ICH (intracerebral hemorrhage) (HCC) 11/29/2019   Gout 05/10/2015   HTN (hypertension) 05/10/2015   Dyslipidemia 05/10/2015   Diabetes (HCC) 04/10/2015    Orientation RESPIRATION BLADDER Height & Weight     Self, Time, Place, Situation  Normal Incontinent Weight:   Height:     BEHAVIORAL SYMPTOMS/MOOD NEUROLOGICAL BOWEL NUTRITION STATUS      Incontinent Diet (modified carb diet with thin liquids)  AMBULATORY STATUS COMMUNICATION OF NEEDS Skin   Extensive Assist Verbally Normal                       Personal Care Assistance Level of Assistance  Bathing, Dressing Bathing Assistance: Limited assistance Feeding assistance: Limited assistance Dressing Assistance: Maximum assistance     Functional Limitations Info    Sight Info: Adequate Hearing Info: Adequate      SPECIAL CARE FACTORS FREQUENCY  PT (By licensed PT), OT (By licensed OT), Speech therapy     PT Frequency: 5xs per week OT Frequency: 5xs per  week     Speech Therapy Frequency: 5xs per week      Contractures Contractures Info: Not present    Additional Factors Info  Code Status, Insulin Sliding Scale Code Status Info: Full Allergies Info: NKA   Insulin Sliding Scale Info: 3 units Novolog if eats 50% or more; Novolog for the following blood sugar levels 70-120- no units; 121-150 2 units;  150-200 3 units; 201-250 5 units; 251-300 is 8 units; 301-350 11 units; 350-401 15 units; long lasting insulin- detemir 5 units daily in the morning.       Current Medications (05/13/2021):  This is the current hospital active medication list Current Facility-Administered Medications  Medication Dose Route Frequency Provider Last Rate Last Admin   acetaminophen (TYLENOL) tablet 650 mg  650 mg Oral Q4H PRN Angiulli, Mcarthur Rossetti, PA-C       Or   acetaminophen (TYLENOL) 160 MG/5ML solution 650 mg  650 mg Per Tube Q4H PRN Angiulli, Mcarthur Rossetti, PA-C       Or   acetaminophen (TYLENOL) suppository 650 mg  650 mg Rectal Q4H PRN Angiulli, Mcarthur Rossetti, PA-C       allopurinol (ZYLOPRIM) tablet 300 mg  300 mg Oral Daily Charlton Amor, PA-C   300 mg at 05/13/21 6546   aspirin chewable tablet 81 mg  81 mg Oral Daily Charlton Amor, PA-C   81 mg at 05/13/21 0853   atorvastatin (LIPITOR) tablet 80 mg  80 mg Oral  Daily Charlton Amor, PA-C   80 mg at 05/13/21 4174   chlorthalidone (HYGROTON) tablet 25 mg  25 mg Oral Daily Charlton Amor, PA-C   25 mg at 05/13/21 0854   clopidogrel (PLAVIX) tablet 75 mg  75 mg Oral Daily Charlton Amor, PA-C   75 mg at 05/13/21 0853   heparin injection 5,000 Units  5,000 Units Subcutaneous Q8H Charlton Amor, PA-C   5,000 Units at 05/13/21 1313   hydrALAZINE (APRESOLINE) tablet 50 mg  50 mg Oral QID Ranelle Oyster, MD   50 mg at 05/13/21 1151   insulin aspart (novoLOG) injection 0-15 Units  0-15 Units Subcutaneous TID WC AngiulliMcarthur Rossetti, PA-C   2 Units at 05/13/21 1309   insulin aspart (novoLOG)  injection 3 Units  3 Units Subcutaneous TID WC AngiulliMcarthur Rossetti, PA-C   3 Units at 05/13/21 1309   insulin detemir (LEVEMIR) injection 5 Units  5 Units Subcutaneous Daily Charlton Amor, PA-C   5 Units at 05/13/21 0814   insulin detemir (LEVEMIR) injection 5 Units  5 Units Subcutaneous Daily Ranelle Oyster, MD       lisinopril (ZESTRIL) tablet 40 mg  40 mg Oral Daily Charlton Amor, PA-C   40 mg at 05/13/21 0854   methylphenidate (RITALIN) tablet 5 mg  5 mg Oral BID WC Ranelle Oyster, MD   5 mg at 05/13/21 1155   metoprolol succinate (TOPROL-XL) 24 hr tablet 100 mg  100 mg Oral Daily Erick Colace, MD   100 mg at 05/13/21 4818   multivitamin with minerals tablet 1 tablet  1 tablet Oral Daily Charlton Amor, PA-C   1 tablet at 05/13/21 5631   Muscle Rub CREA   Topical BID Kai Levins, RPH   Given at 05/13/21 4970   senna-docusate (Senokot-S) tablet 1 tablet  1 tablet Oral QHS PRN Charlton Amor, PA-C         Discharge Medications: Please see discharge summary for a list of discharge medications.  Relevant Imaging Results:  Relevant Lab Results:   Additional Information SS# [id number]  Gretchen Short, LCSW

## 2021-05-14 LAB — GLUCOSE, CAPILLARY
Glucose-Capillary: 112 mg/dL — ABNORMAL HIGH (ref 70–99)
Glucose-Capillary: 206 mg/dL — ABNORMAL HIGH (ref 70–99)
Glucose-Capillary: 197 mg/dL — ABNORMAL HIGH (ref 70–99)
Glucose-Capillary: 167 mg/dL — ABNORMAL HIGH (ref 70–99)

## 2021-05-14 MED ORDER — TROLAMINE SALICYLATE 10 % EX CREA: TOPICAL_CREAM | Freq: Two times a day (BID) | CUTANEOUS | Status: DC

## 2021-05-14 NOTE — Progress Notes (Signed)
Physical Therapy Session Note  Patient Details  Name: Manuel Chapman MRN: [id number] Date of Birth: Jan 04, 1946  Today's Date: 05/14/2021 PT Individual Time: 4098-1191 PT Individual Time Calculation (min): 40 min   Short Term Goals: Week 1:  PT Short Term Goal 1 (Week 1): Pt will initiate stair training PT Short Term Goal 2 (Week 1): Pt will perform STS transfers with CGA PT Short Term Goal 3 (Week 1): Pt will ambulate x 50 ft with LRAD  Skilled Therapeutic Interventions/Progress Updates:     Pt received seated in WC and agrees to therapy. No complaint of pain. WC transport to gym for time management. Pt performs gait training and NMR in parallel bars. Sit to stand with CGA. Pt faces mirror for visual feedback and holds onto bar with both hands. Pt then performs 1x10 high knee marches with each leg, cued to tap thigh to bar with each rep. Activity performed for strengthening hip flexor, contralateral hip abductor, and work on single leg stance balance. Pt takes seated rest break. Pt cued to stand without upper extremity support and pt verbalizes that he does not feel safe doing so. Instead, pt cued to only use L upper extremity for support. Pt ambulates forward and backward 4x5' with CGA/minA. Following, pt becomes very emotional regarding home life and the fact that he has to go to a SNF. PT provides active listening and calming techniques for pt. WC transport back to room. Left seated in WC with alarm intact and all needs within reach.  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

## 2021-05-14 NOTE — Progress Notes (Signed)
Physical Therapy Session Note  Patient Details  Name: Manuel Chapman MRN: [id number] Date of Birth: December 31, 1945  Today's Date: 05/14/2021 PT Individual Time: 2122-4825 PT Individual Time Calculation (min): 70 min   Short Term Goals: Week 1:  PT Short Term Goal 1 (Week 1): Pt will initiate stair training PT Short Term Goal 2 (Week 1): Pt will perform STS transfers with CGA PT Short Term Goal 3 (Week 1): Pt will ambulate x 50 ft with LRAD Week 2:    Week 3:     Skilled Therapeutic Interventions/Progress Updates:   PAIN L shoulder pain 8/10, pt states he has had "muscle rub" applied.  Treatment to tolerance.  Pt initially oob in recliner.  Sit to stand to RW w/max cues for safe hand placement.  Gait 10-31ft to commode and commode transfer w/cga an d cues for safety.  Pt incontinent in brief/continent in toilet of urine.  Total assist for brief change and clothing management in sitting/standing w/CGA w/RW.  Gait 49ft to wc, max cues for safety w/turn/sit to wc.    Pt transported to 4th main gym.   Functional Gait activity:  gait 168ft crossing room mult times to pick up objects off of multilevel surfaces w/RW and cga, some difficulty maintaining grasp w/RUE/improved w/increased attention to this.  Repeated Sit to stand w/emphasis on hand placement w/transition, 5 reps, minimal cueing, cga  Stairs: Sit to stand w/cues for hand placement on wc then to rails.  Ascended/descended 4 6in stairs w/2 rails w/min assist but heavy reliance on pulling up w/knee maintained in extension vs powering up using gluts/quads.  Hesitant to flex knee when descending/again compensates w/increase UE reliance.  Gait:  166ft w/RW w/ cga including ascending/descending ramp, however required max cues for safety as he insisted on pushing walker w/single UE while holding to handrail w/other hand during ramp.   Pt transported back to room at end of session. 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

## 2021-05-14 NOTE — Progress Notes (Signed)
Occupational Therapy Session Note  Patient Details  Name: Manuel Chapman MRN: [id number] Date of Birth: 10-Jun-1945  Today's Date: 05/14/2021 OT Individual Time: 5320-2334 OT Individual Time Calculation (min): 70 min    Short Term Goals: Week 1:  OT Short Term Goal 1 (Week 1): Patient will complete 1/3 parts of toileting task with Min A and LRAD. OT Short Term Goal 2 (Week 1): Patient will don UB clothing with set-up assist in sitting. OT Short Term Goal 3 (Week 1): Patient will don LB clothing with Min A and LRAD. OT Short Term Goal 4 (Week 1): Patient will complete toilet transfer with Min A and LRAD.  Skilled Therapeutic Interventions/Progress Updates:    Pt greeted asleep in bed, eventually able to wake, but slow to initiate bed mobility requiring multiple cues to eventually get to sitting EOB with Min A. Pt agreeable to wash up at EOB with encouragement. Pt had been incontinent of urine in brief and seemed unaware or unbothered by incontinence. Pt stated he knew when he had to go, but unable to tell me whey he did not want to try the urinal or call nursing for assistance. Pt getting frustrated with OT's encouragement to work on continence. Pt abe to wash upper body at EOB and don clean shirt with supervision/set-up A. Pt then agreeable to wash peri-area with supervision, but stated he could not reach his bottom requiring OT assist. Pt stated at home he would get in the shower after a BM as he is unable to clean himself at baseline. Pt impulsive to get to the recliner and ambulated short distance with RW and CGA. Pt needed min A to  thread pants, then CGA to stand with RW to pull them up. Discussed PLO and OT goals. Pt needed assist for some ADLs ad all iADLs. Pt able to open most of his breakfast containers, but did need OT assist to open milk and place in Provale cup. Pt getting frustrated with OT placing liquids in Provale cup despite education provided on reasoning for use. OT expressed  that speech therapy has assessed swallowing and need for this intervention s/p CVA. Pt took his morning meds using Provale cup, but becoming more frustrated. Pt agreeable to stay in recliner with legs elevated and alarm belt on, call bell in reach. Pt left with needs met.   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

## 2021-05-14 NOTE — Progress Notes (Signed)
PROGRESS NOTE   Subjective/Complaints:  No new issues. Still upset about dispo/psychosocial issues. Complains of ongoing left shoulder pain  ROS: Patient denies fever, rash, sore throat, blurred vision, nausea, vomiting, diarrhea, cough, shortness of breath or chest pain,   headache, or mood change.   Objective:   No results found. No results for input(s): WBC, HGB, HCT, PLT in the last 72 hours.  No results for input(s): NA, K, CL, CO2, GLUCOSE, BUN, CREATININE, CALCIUM in the last 72 hours.   Intake/Output Summary (Last 24 hours) at 05/14/2021 1113 Last data filed at 05/14/2021 0820 Gross per 24 hour  Intake 580 ml  Output 525 ml  Net 55 ml         Physical Exam: Vital Signs Blood pressure (!) 171/69, pulse (!) 59, temperature 98.5 F (36.9 C), temperature source Oral, resp. rate 18, SpO2 99 %.    Constitutional: No distress . Vital signs reviewed. HEENT: NCAT, EOMI, oral membranes moist Neck: supple Cardiovascular: RRR without murmur. No JVD    Respiratory/Chest: CTA Bilaterally without wheezes or rales. Normal effort    GI/Abdomen: BS +, non-tender, non-distended Ext: no clubbing, cyanosis, or edema Psych: pleasant and cooperative  Skin: sacral wound Neurologic: more alert.  Follows basic commands with some delay. Cranial nerves II through XII intact, motor strength is 5/5 in right deltoid, bicep, tricep, grip,  and bilateral hip flexor, knee extensors, ankle dorsiflexor and plantar flexor. Prox left upper limited by pain Sensory exam normal sensation to light touch and proprioception in bilateral upper and lower extremities Cerebellar exam normal finger to nose to finger as well as heel to shin in bilateral upper and lower extremities Musculoskeletal: left shoulder tender at acromium with ER/IR once again   Assessment/Plan: 1. Functional deficits which require 3+ hours per day of interdisciplinary therapy in  a comprehensive inpatient rehab setting. Physiatrist is providing close team supervision and 24 hour management of active medical problems listed below. Physiatrist and rehab team continue to assess barriers to discharge/monitor patient progress toward functional and medical goals  Care Tool:  Bathing    Body parts bathed by patient: Left arm, Chest, Abdomen, Front perineal area, Right upper leg, Left upper leg, Face   Body parts bathed by helper: Right arm, Buttocks, Right lower leg, Left lower leg     Bathing assist Assist Level: Moderate Assistance - Patient 50 - 74%     Upper Body Dressing/Undressing Upper body dressing   What is the patient wearing?: Pull over shirt    Upper body assist Assist Level: Supervision/Verbal cueing    Lower Body Dressing/Undressing Lower body dressing      What is the patient wearing?: Pants     Lower body assist Assist for lower body dressing: Minimal Assistance - Patient > 75%     Toileting Toileting Toileting Activity did not occur Landscape architect and hygiene only): Refused  Toileting assist Assist for toileting: Maximal Assistance - Patient 25 - 49%     Transfers Chair/bed transfer  Transfers assist  Chair/bed transfer activity did not occur: Refused  Chair/bed transfer assist level: Minimal Assistance - Patient > 75%     Locomotion Ambulation  Ambulation assist      Assist level: Minimal Assistance - Patient > 75% Assistive device: Walker-rolling Max distance: 12'   Walk 10 feet activity   Assist     Assist level: Minimal Assistance - Patient > 75% Assistive device: Walker-rolling   Walk 50 feet activity   Assist Walk 50 feet with 2 turns activity did not occur: Safety/medical concerns  Assist level: Minimal Assistance - Patient > 75% Assistive device: Walker-rolling    Walk 150 feet activity   Assist Walk 150 feet activity did not occur: Safety/medical concerns         Walk 10 feet on  uneven surface  activity   Assist Walk 10 feet on uneven surfaces activity did not occur: Safety/medical concerns         Wheelchair     Assist Is the patient using a wheelchair?: Yes Type of Wheelchair: Manual    Wheelchair assist level: Maximal Assistance - Patient 25 - 49% Max wheelchair distance: 20    Wheelchair 50 feet with 2 turns activity    Assist        Assist Level: Maximal Assistance - Patient 25 - 49% (per PT documentation)   Wheelchair 150 feet activity     Assist      Assist Level: Total Assistance - Patient < 25% (per PT documentation)   Blood pressure (!) 171/69, pulse (!) 59, temperature 98.5 F (36.9 C), temperature source Oral, resp. rate 18, SpO2 99 %.  Medical Problem List and Plan: 1. 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  Antithrombotics: -DVT/anticoagulation:  Pharmaceutical: Heparin             -antiplatelet therapy: Aspirin 81 mg daily and Plavix 75 mg day x3 weeks then Plavix alone 3. Pain Management: Tylenol as needed  -aspercreme for left shoulder, can use muscle rub also 4. Mood and sleep/wake:    -added ritalin to help with arousal, motivation and processing speed--has helped             -antipsychotic agents: N/A 5. Neuropsych: This patient is capable of making decisions on his own behalf. 6. Skin/Wound Care: Routine skin checks 7. Fluids/Electrolytes/Nutrition:  encourage po  Push po   8.  Diabetes mellitus.  Hemoglobin A1c 9.9.  NovoLog 3 units 3 times daily, Levemir  units  daily.    CBG (last 3)  Recent Labs    05/13/21 1731 05/13/21 2043 05/14/21 0559  GLUCAP 132* 161* 112*    1/19 cbg's improved with  addition of pm levemir dose to put him at 5u bid 9.  Hypertension.  Lisinopril 40 mg daily, Toprol-XL 50 mg daily, hydralazine 50 mg every 8 hours, [place] 25 mg daily.  Monitor with increased mobility   Vitals:   05/13/21 1924 05/14/21 0501  BP: (!) 171/69 (!) 171/69  Pulse: 65 (!) 59  Resp: 16 18  Temp: 98.5 F (36.9 C) 98.5 F (36.9 C)  SpO2: 100% 99%   Increased toprol to 100mg  qd, bp's elevated still. Increase hydralazine to qid  -1/19 observe. Some initial improvement 10.  Hyperlipidemia.  Lipitor 11.  History of gout.  Allopurinol.  Monitor for any gout flareups    LOS: 5 days A FACE TO FACE EVALUATION WAS PERFORMED  Meredith Staggers 05/14/2021, 11:13 AM

## 2021-05-15 LAB — GLUCOSE, CAPILLARY
Glucose-Capillary: 165 mg/dL — ABNORMAL HIGH (ref 70–99)
Glucose-Capillary: 181 mg/dL — ABNORMAL HIGH (ref 70–99)
Glucose-Capillary: 196 mg/dL — ABNORMAL HIGH (ref 70–99)
Glucose-Capillary: 181 mg/dL — ABNORMAL HIGH (ref 70–99)

## 2021-05-15 MED ORDER — INSULIN DETEMIR 100 UNIT/ML ~~LOC~~ SOLN
7.0000 [IU] | Freq: Every day | SUBCUTANEOUS | Status: DC
  Administered 2021-05-16 – 2021-05-19 (×4): 7 [IU] via SUBCUTANEOUS
  Filled 2021-05-15 (×4): qty 0.07

## 2021-05-15 MED ORDER — INSULIN DETEMIR 100 UNIT/ML ~~LOC~~ SOLN
7.0000 [IU] | Freq: Every day | SUBCUTANEOUS | Status: DC
  Administered 2021-05-15 – 2021-05-30 (×16): 7 [IU] via SUBCUTANEOUS
  Filled 2021-05-15 (×17): qty 0.07

## 2021-05-15 MED ORDER — CLONIDINE HCL 0.1 MG PO TABS
0.1000 mg | ORAL_TABLET | Freq: Two times a day (BID) | ORAL | Status: DC
  Administered 2021-05-15 – 2021-05-30 (×31): 0.1 mg via ORAL
  Filled 2021-05-15 (×31): qty 1

## 2021-05-15 NOTE — Progress Notes (Signed)
Patient ID: Manuel Chapman, male   DOB: [birth year], 76 y.o.   MRN: [id number]  SW received phone call from [name]. Reporting they were looking at SNFs today. States she will follow-up once reviewed.   Dtr would like [facility]. SW informed will make contact and discuss if bed offer an option.   *SW received return phone call from [name]/Admissions with [facility] [phone]) reporting they are able to extend bed offer for short term rehab only as they do not have any long term care beds. SW informed will inform family.   SW spoke with [name] to discuss above with regard to above. Would like for SW to continue to explore long term care options. [facility] [address]   SW left message for [facility] [phone] cell) to discuss referral and waiting on follow-up.    SW spoke with Janie/Admissions with [facility] and reported currently no beds available. States they do not have any long term care beds at this time. Not sure when rehab beds will be available.   SW left message for Soy/Admissions with [name] and waiting on follow-up.   Cecile Sheerer, MSW, LCSWA Office: [phone] Cell: [phone] Fax: [phone]

## 2021-05-15 NOTE — Progress Notes (Signed)
Occupational Therapy Session Note  Patient Details  Name: Manuel Chapman MRN: [id number] Date of Birth: [birth year]  Today's Date: 05/15/2021 OT Individual Time: 1950-9326 OT Individual Time Calculation (min): 26 min    Short Term Goals: Week 1:  OT Short Term Goal 1 (Week 1): Patient will complete 1/3 parts of toileting task with Min A and LRAD. OT Short Term Goal 2 (Week 1): Patient will don UB clothing with set-up assist in sitting. OT Short Term Goal 3 (Week 1): Patient will don LB clothing with Min A and LRAD. OT Short Term Goal 4 (Week 1): Patient will complete toilet transfer with Min A and LRAD.  Skilled Therapeutic Interventions/Progress Updates:  Skilled OT intervention completed with focus on ADL retraining and bilateral integration/NMR. Pt received seated in recliner, asleep, easily aroused. Pt wearing hospital gown, stating that his "barber is coming to shave him" with therapist encouraging pt to wear hospital scrubs for visiting with barber and family vs gown to increase functionality. Pt completed UB dressing with supervision, then LB dressing with pt threading both BLEs with increased time to encourage pt effort, with pt sit > stand with CGA using RW and pt donning pants over hips with CGA only for balance. To promote bilateral integration/NMR of LUE pt participated in bicep flexion exercise using 2 pound dowel, for 2x20, with cues needed for bringing LUE up at same time as RUE, with pt stating "that's from the stroke." Therapist educated on purpose of task, as well as importance of practicing movements to promote healing and function. Pt with limited supination of wrist, with pt modifying the underhand grip by placing two fingers around the bar. Pt left seated in recliner, with chair alarm on and all needs in reach at end of session.   Therapy Documentation Precautions:  Precautions Precautions: Fall Precaution Comments: Reports previous CVA with residual L  weakness Restrictions Weight Bearing Restrictions: No  Pain: No c/o pain   Therapy/Group: Individual Therapy  [name] E Drae Mitzel 05/15/2021, 7:34 AM

## 2021-05-15 NOTE — Plan of Care (Signed)
°  Problem: RH Balance Goal: LTG Patient will maintain dynamic standing with ADLs (OT) Description: LTG:  Patient will maintain dynamic standing balance with assist during activities of daily living (OT)  Flowsheets (Taken 05/15/2021 1539) LTG: Pt will maintain dynamic standing balance during ADLs with: Contact Guard/Touching assist Note: Goal downgraded 1/20 due to lack of progress-ESD   Problem: Sit to Stand Goal: LTG:  Patient will perform sit to stand in prep for activites of daily living with assistance level (OT) Description: LTG:  Patient will perform sit to stand in prep for activites of daily living with assistance level (OT) Flowsheets (Taken 05/15/2021 1539) LTG: PT will perform sit to stand in prep for activites of daily living with assistance level: Contact Guard/Touching assist Note: Goal downgraded 1/20 due to lack of progress-ESD   Problem: RH Bathing Goal: LTG Patient will bathe all body parts with assist levels (OT) Description: LTG: Patient will bathe all body parts with assist levels (OT) Flowsheets (Taken 05/15/2021 1539) LTG: Pt will perform bathing with assistance level/cueing: Minimal Assistance - Patient > 75% Note: Goal downgraded 1/20 due to lack of progress-ESD   Problem: RH Toileting Goal: LTG Patient will perform toileting task (3/3 steps) with assistance level (OT) Description: LTG: Patient will perform toileting task (3/3 steps) with assistance level (OT)  Flowsheets (Taken 05/15/2021 1539) LTG: Pt will perform toileting task (3/3 steps) with assistance level: Minimal Assistance - Patient > 75% Note: Goal downgraded 1/20 due to lack of progress-ESD   Problem: RH Toilet Transfers Goal: LTG Patient will perform toilet transfers w/assist (OT) Description: LTG: Patient will perform toilet transfers with assist, with/without cues using equipment (OT) Flowsheets (Taken 05/15/2021 1539) LTG: Pt will perform toilet transfers with assistance level of: Contact  Guard/Touching assist Note: Goal downgraded 1/20 due to lack of progress-ESD   Problem: RH Tub/Shower Transfers Goal: LTG Patient will perform tub/shower transfers w/assist (OT) Description: LTG: Patient will perform tub/shower transfers with assist, with/without cues using equipment (OT) Outcome: Not Applicable Flowsheets (Taken 05/15/2021 1539) LTG: Pt will perform tub/shower stall transfers with assistance level of: (dc goal 1/20-ESD) -- Note: Goal discharged 1/20 due to lack of progress-ESD

## 2021-05-15 NOTE — Progress Notes (Signed)
Occupational Therapy Note  Patient Details  Name: Lakhi Dils MRN: [id number] Date of Birth: Jul 13, 1945  Today's Date: 05/15/2021 OT Missed Time: 68 Minutes Missed Time Reason: Patient fatigue  Patient greeted semi-reclined in bed for scheduled treatment session. Pt would open eyes briefly, but refused to get out of bed or participate with OT in any capacity. OT provided multiple options for therapy session, but pt continued to stat "All i'm going to do right now is sleep." OT to followup per plan of care.   Daneen Schick Carolan Avedisian 05/15/2021, 3:37 PM

## 2021-05-15 NOTE — Progress Notes (Signed)
Speech Language Pathology Weekly Progress and Session Note  Patient Details  Name: Manuel Chapman MRN: [id number] Date of Birth: 1945/12/26  Beginning of progress report period: May 09, 2021 End of progress report period: May 15, 2021  Today's Date: 05/15/2021 SLP Individual Time: 0730-0825 SLP Individual Time Calculation (min): 55 min  Short Term Goals: Week 1: SLP Short Term Goal 1 (Week 1): Patient will tolerate current diet with minimal-to-no overt s/sx of aspiration with min A verbal cues for use of swallowing compensatory strategies (provale cup). SLP Short Term Goal 1 - Progress (Week 1): Not met SLP Short Term Goal 2 (Week 1): Patient will use speech intelligibility strategies to improve speech intelligibility at phrase level given minA verbal cues. SLP Short Term Goal 2 - Progress (Week 1): Not met SLP Short Term Goal 3 (Week 1): Patient will sustain attention to a given task for 10 minutes with <2 re-directions in a quiet environment. SLP Short Term Goal 3 - Progress (Week 1): Met SLP Short Term Goal 4 (Week 1): Patient will demonstrate orientation x4 with minA verbal cues to refer to compensatory aids. SLP Short Term Goal 4 - Progress (Week 1): Met    New Short Term Goals: Week 2: SLP Short Term Goal 1 (Week 2): STGs=LTGs due to ELOS  Weekly Progress Updates: Patient has made minimal and inconsistent gains and has met 2 of 4 STGs this reporting period. Currently, patient is consuming regular textures with thin liquids via the Provale cup with minimal overt s/s of aspiration. However, patient requires Max verbal cues for use of Provale cup despite education resulting in intermittent frustration. Recommend patient continue current diet. Patient also continues to demonstrate a moderate dysarthria characterized by imprecise consonants and a low vocal intensity. Patient is ~75-90% intelligible with decreased ability to utilize speech compensatory strategies despite cues.  Patient with baseline cognitive deficits but requires Min-Mod verbal cues for sustained attention as patient can be verbose and tangential. Patient also with decreased awareness of deficits with poor frustration tolerance. Patient and family education ongoing. Patient would benefit from skilled SLP intervention to maximize his swallowing and cognitive functioning as well as his speech intelligibility prior to discharge.      Intensity: Minumum of 1-2 x/day, 30 to 90 minutes Frequency: 3 to 5 out of 7 days Duration/Length of Stay: 05/19/21 Treatment/Interventions: Cognitive remediation/compensation;Internal/external aids;Dysphagia/aspiration precaution training;Cueing hierarchy;Environmental controls;Therapeutic Activities;DME/adaptive equipment instruction;Functional tasks;Patient/family education   Daily Session  Skilled Therapeutic Interventions:  Skilled treatment session focused on cognitive and dysphagia goals. Upon arrival, patient was asleep in bed but easily roused. When asked, patient indicated that his brief was wet. Patient had been incontinent of urine and had socked through his bed. Patient continues to endorse decreased sensation indicating he needs to void but SLP educated on importance of utilizing the call bell to request a clean brief when incontinent. Patient transferred to the recliner with Mod verbal cues needed for safety and hand placement with task. Patient required extra time and Min verbal cues to initiate tray set-up with his breakfast meal. Patient divided his attention between self-feeding and a functional conversation for ~30 minutes with supervision level verbal cues needed for redirection. Patient without overt s/s of aspiration during meal but removed the lid from his provale cup and consumed the entire 4 oz of milk in large, sequential sips when SLP left the room to retrieve tisses. SLP provided education regarding rationale for provale cup. Patient left upright in the  recliner with alarm on  and all needs within reach. Continue with current plan of care.    Pain No/Denies Pain   Therapy/Group: Individual Therapy  Manuel Chapman 05/15/2021, 6:16 AM

## 2021-05-15 NOTE — Progress Notes (Signed)
Physical Therapy Session Note  Patient Details  Name: Manuel Chapman MRN: [id number] Date of Birth: 03-16-1946  Today's Date: 05/15/2021 PT Individual Time: 4098-1191 PT Individual Time Calculation (min): 71 min   Short Term Goals: Week 1:  PT Short Term Goal 1 (Week 1): Pt will initiate stair training PT Short Term Goal 2 (Week 1): Pt will perform STS transfers with CGA PT Short Term Goal 3 (Week 1): Pt will ambulate x 50 ft with LRAD  Skilled Therapeutic Interventions/Progress Updates:     Pt received supine in bed and agrees to therapy. No complaint of pain. Pt noted to have saturated brief with urine and is unaware of incontinence. Pt performs supine bridging with verbal cues to help doff pants. Pt then performs bilateral rolling with cues on body mechanics to assist with doffing brief and donning clean brief. Supine to sit with bed features and minA. Pt performs stand pivot transfer to [facility] with RW and verbal cues for body mechanics and positioning. WC transport to gym for time management. Pt perform stand step transfer to nustep with CGA and cues for safe hand placement and positioning. Pt completes x15:00 at workload of 5 with L hand ace wrapped to handle to facilitate grip. Pt cued to maintain steps per minute ~40 but is unable to do so, likely due to attention deficit, despite multiple cues from PT. Performed for strength and endurance training and NMR for L hemibody  Pt performs sit to stand to high low table and uses L upper extremity for support while playing PT in checkers to provide cognitive challenge. Pt reaches outside BOS and maintains standing >15 minutes during activity. WC transport back to room. Left seated with alarm intact and all needs within reach.  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

## 2021-05-15 NOTE — Progress Notes (Signed)
PROGRESS NOTE   Subjective/Complaints:  Pt up in chair. No new issues. Demonstrating improved efforts in therapies. Sit-stand with CGA. Still emotional about dispo  ROS: Patient denies fever, rash, sore throat, blurred vision, nausea, vomiting, diarrhea, cough, shortness of breath or chest pain,  headache, or mood change.    Objective:   No results found. No results for input(s): WBC, HGB, HCT, PLT in the last 72 hours.  No results for input(s): NA, K, CL, CO2, GLUCOSE, BUN, CREATININE, CALCIUM in the last 72 hours.   Intake/Output Summary (Last 24 hours) at 05/15/2021 1117 Last data filed at 05/15/2021 0837 Gross per 24 hour  Intake 720 ml  Output 400 ml  Net 320 ml         Physical Exam: Vital Signs Blood pressure (!) 173/77, pulse (!) 59, temperature 98.2 F (36.8 C), resp. rate 17, SpO2 98 %.   Constitutional: No distress . Vital signs reviewed. HEENT: NCAT, EOMI, oral membranes moist Neck: supple Cardiovascular: RRR without murmur. No JVD    Respiratory/Chest: CTA Bilaterally without wheezes or rales. Normal effort    GI/Abdomen: BS +, non-tender, non-distended Ext: no clubbing, cyanosis, or edema Psych: pleasant and cooperative  Skin: sacral wound Neurologic: more alert.  Follows basic commands with some delay. Cranial nerves II through XII intact, motor strength is 5/5 in right deltoid, bicep, tricep, grip,  and bilateral hip flexor, knee extensors, ankle dorsiflexor and plantar flexor. Prox left upper limited by pain Sensory exam normal sensation to light touch and proprioception in bilateral upper and lower extremities Cerebellar exam normal finger to nose to finger as well as heel to shin in bilateral upper and lower extremities Musculoskeletal: left shoulder remains tender at acromium with ER/IR once again, limits to ROM d/t pain/blocking   Assessment/Plan: 1. Functional deficits which require 3+  hours per day of interdisciplinary therapy in a comprehensive inpatient rehab setting. Physiatrist is providing close team supervision and 24 hour management of active medical problems listed below. Physiatrist and rehab team continue to assess barriers to discharge/monitor patient progress toward functional and medical goals  Care Tool:  Bathing    Body parts bathed by patient: Left arm, Chest, Abdomen, Front perineal area, Right upper leg, Left upper leg, Face   Body parts bathed by helper: Right arm, Buttocks, Right lower leg, Left lower leg     Bathing assist Assist Level: Moderate Assistance - Patient 50 - 74%     Upper Body Dressing/Undressing Upper body dressing   What is the patient wearing?: Pull over shirt    Upper body assist Assist Level: Supervision/Verbal cueing    Lower Body Dressing/Undressing Lower body dressing      What is the patient wearing?: Pants     Lower body assist Assist for lower body dressing: Contact Guard/Touching assist     Toileting Toileting Toileting Activity did not occur (Clothing management and hygiene only): Refused  Toileting assist Assist for toileting: Maximal Assistance - Patient 25 - 49%     Transfers Chair/bed transfer  Transfers assist  Chair/bed transfer activity did not occur: Refused  Chair/bed transfer assist level: Minimal Assistance - Patient > 75%  Locomotion Ambulation   Ambulation assist      Assist level: Minimal Assistance - Patient > 75% Assistive device: Walker-rolling Max distance: 12'   Walk 10 feet activity   Assist     Assist level: Minimal Assistance - Patient > 75% Assistive device: Walker-rolling   Walk 50 feet activity   Assist Walk 50 feet with 2 turns activity did not occur: Safety/medical concerns  Assist level: Minimal Assistance - Patient > 75% Assistive device: Walker-rolling    Walk 150 feet activity   Assist Walk 150 feet activity did not occur: Safety/medical  concerns         Walk 10 feet on uneven surface  activity   Assist Walk 10 feet on uneven surfaces activity did not occur: Safety/medical concerns         Wheelchair     Assist Is the patient using a wheelchair?: Yes Type of Wheelchair: Manual    Wheelchair assist level: Maximal Assistance - Patient 25 - 49% Max wheelchair distance: 20    Wheelchair 50 feet with 2 turns activity    Assist        Assist Level: Maximal Assistance - Patient 25 - 49% (per PT documentation)   Wheelchair 150 feet activity     Assist      Assist Level: Total Assistance - Patient < 25% (per PT documentation)   Blood pressure (!) 173/77, pulse (!) 59, temperature 98.2 F (36.8 C), resp. rate 17, SpO2 98 %.  Medical Problem List and Plan: 1. 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  Antithrombotics: -DVT/anticoagulation:  Pharmaceutical: Heparin             -antiplatelet therapy: Aspirin 81 mg daily and Plavix 75 mg day x3 weeks then Plavix alone 3. Pain Management: Tylenol as needed  -aspercreme for left shoulder, can use muscle rub also  -encouraged pt to work on his ROM  4. Mood and sleep/wake:    -added ritalin to help with arousal, motivation and processing speed--has helped             -antipsychotic agents: N/A 5. Neuropsych: This patient is capable of making decisions on his own behalf. 6. Skin/Wound Care: Routine skin checks 7. Fluids/Electrolytes/Nutrition:  encourage po  Push po   8.  Diabetes mellitus.  Hemoglobin A1c 9.9.  NovoLog 3 units 3 times daily, Levemir  units  daily.    CBG (last 3)  Recent Labs    05/14/21 1635 05/14/21 2100 05/15/21 0549  GLUCAP  197* 167* 165*    1/120 cbg's improved with addition of pm levemir dose to put him at 5u bid. Still needs work. Increase to 7u bid 9.  Hypertension.  Lisinopril 40 mg daily, Toprol-XL 50 mg daily, hydralazine 50 mg every 8 hours, [place] 25 mg daily.  Monitor with increased mobility   Vitals:   05/14/21 2045 05/15/21 0406  BP: (!) 190/86 (!) 173/77  Pulse: 63 (!) 59  Resp: 18 17  Temp: 98 F (36.7 C) 98.2 F (36.8 C)  SpO2: 98% 98%   Increased toprol to 100mg  qd, bp's elevated still.  Increase hydralazine to qid  -1/20 still poorly controlled. Add clonidine 0.1mg  bid 10.  Hyperlipidemia.  Lipitor 11.  History of gout.  Allopurinol.  Monitor for any gout flareups    LOS: 6 days A FACE TO FACE EVALUATION WAS PERFORMED  Ranelle Oyster 05/15/2021, 11:17 AM

## 2021-05-16 LAB — GLUCOSE, CAPILLARY
Glucose-Capillary: 138 mg/dL — ABNORMAL HIGH (ref 70–99)
Glucose-Capillary: 148 mg/dL — ABNORMAL HIGH (ref 70–99)
Glucose-Capillary: 142 mg/dL — ABNORMAL HIGH (ref 70–99)
Glucose-Capillary: 187 mg/dL — ABNORMAL HIGH (ref 70–99)

## 2021-05-16 NOTE — Progress Notes (Signed)
Speech Language Pathology Daily Session Note  Patient Details  Name: Nathaniel Wakeley MRN: [id number] Date of Birth: 1946-04-12  Today's Date: 05/16/2021 SLP Individual Time: 4401-0272 SLP Individual Time Calculation (min): 45 min  Short Term Goals: Week 2: SLP Short Term Goal 1 (Week 2): STGs=LTGs due to ELOS  Skilled Therapeutic Interventions: Pt seen for skilled ST with focus on swallowing and cognitive goals, pt in bed and agreeable to therapeutic tasks. Pt initially perseverating on "I just watch it all" and "they all cut up", more topic appropriateness after rapport building with SLP. When discussing the Provale cup, pt reports "I don't like it, it's too slow". Pt reports habitual "chugging" of liquids his whole life and benefits from cues and education re: rationale for Provale. Pt speaking animatedly during session regarding family, prior work, and dispo. Pt appears to demonstrate increased awareness of physical and cognitive impairments at this time, is aware he is d/c-ing to SNF probably long term. SLP facilitating simple problem solving task providing overall min A cues for error awareness. SLP providing pt room phone per request, left in bed with alarm set and all needs within reach. Cont ST POC.   Pain Pain Assessment Pain Scale: 0-10 Pain Score: 0-No pain  Therapy/Group: Individual Therapy  Tacey Ruiz 05/16/2021, 10:14 AM

## 2021-05-16 NOTE — Progress Notes (Signed)
PROGRESS NOTE   Subjective/Complaints: Feeling well Denies pain Tolerating SLP well.  No issues reported overnight  ROS: Patient denies fever, rash, sore throat, blurred vision, nausea, vomiting, diarrhea, cough, shortness of breath or chest pain,  headache, or mood change.    Objective:   No results found. No results for input(s): WBC, HGB, HCT, PLT in the last 72 hours.  No results for input(s): NA, K, CL, CO2, GLUCOSE, BUN, CREATININE, CALCIUM in the last 72 hours.   Intake/Output Summary (Last 24 hours) at 05/16/2021 1851 Last data filed at 05/16/2021 1300 Gross per 24 hour  Intake 440 ml  Output --  Net 440 ml         Physical Exam: Vital Signs Blood pressure (!) 120/51, pulse (!) 55, temperature (!) 97.5 F (36.4 C), resp. rate 17, SpO2 100 %.   Constitutional: No distress . Vital signs reviewed. HEENT: NCAT, EOMI, oral membranes moist Neck: supple Cardiovascular: Bradycardic Respiratory/Chest: CTA Bilaterally without wheezes or rales. Normal effort    GI/Abdomen: BS +, non-tender, non-distended Ext: no clubbing, cyanosis, or edema Psych: pleasant and cooperative  Skin: sacral wound Neurologic: more alert.  Follows basic commands with some delay. Cranial nerves II through XII intact, motor strength is 5/5 in right deltoid, bicep, tricep, grip,  and bilateral hip flexor, knee extensors, ankle dorsiflexor and plantar flexor. Prox left upper limited by pain Sensory exam normal sensation to light touch and proprioception in bilateral upper and lower extremities Cerebellar exam normal finger to nose to finger as well as heel to shin in bilateral upper and lower extremities Musculoskeletal: left shoulder remains tender at acromium with ER/IR once again, limits to ROM d/t pain/blocking   Assessment/Plan: 1. Functional deficits which require 3+ hours per day of interdisciplinary therapy in a comprehensive  inpatient rehab setting. Physiatrist is providing close team supervision and 24 hour management of active medical problems listed below. Physiatrist and rehab team continue to assess barriers to discharge/monitor patient progress toward functional and medical goals  Care Tool:  Bathing    Body parts bathed by patient: Left arm, Chest, Abdomen, Front perineal area, Right upper leg, Left upper leg, Face   Body parts bathed by helper: Right arm, Buttocks, Right lower leg, Left lower leg     Bathing assist Assist Level: Moderate Assistance - Patient 50 - 74%     Upper Body Dressing/Undressing Upper body dressing   What is the patient wearing?: Pull over shirt    Upper body assist Assist Level: Supervision/Verbal cueing    Lower Body Dressing/Undressing Lower body dressing      What is the patient wearing?: Pants     Lower body assist Assist for lower body dressing: Contact Guard/Touching assist     Toileting Toileting Toileting Activity did not occur (Clothing management and hygiene only): Refused  Toileting assist Assist for toileting: Maximal Assistance - Patient 25 - 49%     Transfers Chair/bed transfer  Transfers assist  Chair/bed transfer activity did not occur: Refused  Chair/bed transfer assist level: Minimal Assistance - Patient > 75%     Locomotion Ambulation   Ambulation assist      Assist level: Minimal Assistance -  Patient > 75% Assistive device: Walker-rolling Max distance: 12'   Walk 10 feet activity   Assist     Assist level: Minimal Assistance - Patient > 75% Assistive device: Walker-rolling   Walk 50 feet activity   Assist Walk 50 feet with 2 turns activity did not occur: Safety/medical concerns  Assist level: Minimal Assistance - Patient > 75% Assistive device: Walker-rolling    Walk 150 feet activity   Assist Walk 150 feet activity did not occur: Safety/medical concerns         Walk 10 feet on uneven surface   activity   Assist Walk 10 feet on uneven surfaces activity did not occur: Safety/medical concerns         Wheelchair     Assist Is the patient using a wheelchair?: Yes Type of Wheelchair: Manual    Wheelchair assist level: Maximal Assistance - Patient 25 - 49% Max wheelchair distance: 20    Wheelchair 50 feet with 2 turns activity    Assist        Assist Level: Maximal Assistance - Patient 25 - 49% (per PT documentation)   Wheelchair 150 feet activity     Assist      Assist Level: Total Assistance - Patient < 25% (per PT documentation)   Blood pressure (!) 120/51, pulse (!) 55, temperature (!) 97.5 F (36.4 C), resp. rate 17, SpO2 100 %.  Medical Problem List and Plan: 1. 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  Antithrombotics: -DVT/anticoagulation:  Pharmaceutical: Heparin             -antiplatelet therapy: Aspirin 81 mg daily and Plavix 75 mg day x3 weeks then Plavix alone 3. Pain Management: Tylenol as needed  -aspercreme for left shoulder, can use muscle rub also  -encouraged pt to work on his ROM  4. Mood and sleep/wake:    -added ritalin to help with arousal, motivation and processing speed--has helped             -antipsychotic agents: N/A 5. Neuropsych: This patient is capable of making decisions on his own behalf. 6. Skin/Wound Care: Routine skin checks 7. Fluids/Electrolytes/Nutrition:  encourage po  Push po   8.  Diabetes mellitus.  Hemoglobin A1c 9.9.  NovoLog 3 units 3 times daily, Levemir  units  daily.    CBG (last 3)  Recent Labs    05/16/21 0545 05/16/21 1158 05/16/21 1659  GLUCAP 138* 148* 142*    1/120 cbg's improved with  addition of pm levemir dose to put him at 5u bid. Still needs work. Increase to 7u bid 9.  Hypertension.  Lisinopril 40 mg daily, Toprol-XL 50 mg daily, hydralazine 50 mg every 8 hours, Hygroton 25 mg daily.  Monitor with increased mobility   Vitals:   05/16/21 0332 05/16/21 1327  BP: (!) 143/45 (!) 120/51  Pulse: (!) 57 (!) 55  Resp: 17 17  Temp: 97.9 F (36.6 C) (!) 97.5 F (36.4 C)  SpO2: 98% 100%   Increased toprol to 100mg  qd, bp's elevated still. Increase hydralazine to qid  -1/20 still poorly controlled. Add clonidine 0.1mg  bid  10.  Hyperlipidemia.  continue Lipitor 11.  History of gout.  continue Allopurinol.  Monitor for any gout flareups 12. Bradycardia: 50s-60s: continue to monitor HR TID.     LOS: 7 days A FACE TO FACE EVALUATION WAS PERFORMED  Drema PryKrutika P Aseneth Hack 05/16/2021, 6:51 PM

## 2021-05-17 LAB — GLUCOSE, CAPILLARY
Glucose-Capillary: 140 mg/dL — ABNORMAL HIGH (ref 70–99)
Glucose-Capillary: 192 mg/dL — ABNORMAL HIGH (ref 70–99)
Glucose-Capillary: 208 mg/dL — ABNORMAL HIGH (ref 70–99)
Glucose-Capillary: 166 mg/dL — ABNORMAL HIGH (ref 70–99)

## 2021-05-17 NOTE — Progress Notes (Signed)
Physical Therapy Session Note  Patient Details  Name: Manuel Chapman MRN: [id number] Date of Birth: Feb 17, 1946  Today's Date: 05/17/2021 PT Individual Time: 1607-3710 PT Individual Time Calculation (min): 68 min   Short Term Goals: Week 1:  PT Short Term Goal 1 (Week 1): Pt will initiate stair training PT Short Term Goal 2 (Week 1): Pt will perform STS transfers with CGA PT Short Term Goal 3 (Week 1): Pt will ambulate x 50 ft with LRAD  Skilled Therapeutic Interventions/Progress Updates:    Initially attempted to see patient at scheduled therapy time of 1400, pt declines participation due to feeling "sleepy". This therapist returns at 1530 and pt agreeable to participate in session. No complaints of pain. Supine to sit with Supervision with increased time, use of bedrail, HOB elevated. Pt found to be incontinent of urine and bowel in his brief. Sit to stand with CGA to RW. Pt is setup A for anterior pericare, dependent for posterior pericare and donning of new brief. Pt is max A to don clean scrub pants, setup A to don new hospital gown. Stand pivot transfer to w/c with RW and CGA. Ambulation x 100 ft with RW and CGA for balance, cues for upright trunk during gait. Pt exhibits decreased BLE foot clearance during gait and tends to "scoot" his feet along the floor. Pt declines further ambulation following 100 ft. Pt agreeable to participate in standing balance task performing ball toss against rebounder with no UE support and CGA for balance, x 10 reps, x 15 reps to fatigue. Pt agreeable to remain seated in w/c at end of session, quick release belt in place and needs in reach.  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

## 2021-05-17 NOTE — Progress Notes (Signed)
Speech Language Pathology Daily Session Note  Patient Details  Name: Manuel Chapman MRN: [id number] Date of Birth: January 29, 1946  Today's Date: 05/17/2021 SLP Individual Time: 1020-1100 SLP Individual Time Calculation (min): 40 min  Short Term Goals: Week 2: SLP Short Term Goal 1 (Week 2): STGs=LTGs due to ELOS  Skilled Therapeutic Interventions: Pt was seen for skilled ST targeting cognitive goals.   Pt was awake, alert, and agreeable to participating in treatment upon therapist's arrival.  SLP facilitated the session with a basic card game to address attention, memory, and problem solving goals.  Pt sustained his attention to task for ~5 minute intervals before requiring min cues for redirection.  As a result, pt also needed min cues for recall of task rules and procedures in order to effectively plan and execute a basic problem solving strategy.  Pt was left in bed with bed alarm set and call bell within reach.  Continue per current plan of care.    Pain Pain Assessment Pain Scale: 0-10 Pain Score: 0-No pain  Therapy/Group: Individual Therapy  Rosalynn Sergent, Melanee Spry 05/17/2021, 12:45 PM

## 2021-05-18 LAB — GLUCOSE, CAPILLARY
Glucose-Capillary: 157 mg/dL — ABNORMAL HIGH (ref 70–99)
Glucose-Capillary: 161 mg/dL — ABNORMAL HIGH (ref 70–99)
Glucose-Capillary: 153 mg/dL — ABNORMAL HIGH (ref 70–99)
Glucose-Capillary: 142 mg/dL — ABNORMAL HIGH (ref 70–99)

## 2021-05-18 NOTE — Progress Notes (Signed)
PROGRESS NOTE   Subjective/Complaints: Pt in pretty good spirits. Just finished with OT. Left shoulder feeling better  ROS: Patient denies fever, rash, sore throat, blurred vision, nausea, vomiting, diarrhea, cough, shortness of breath or chest pain,  headache, or mood change.    Objective:   No results found. No results for input(s): WBC, HGB, HCT, PLT in the last 72 hours.  No results for input(s): NA, K, CL, CO2, GLUCOSE, BUN, CREATININE, CALCIUM in the last 72 hours.   Intake/Output Summary (Last 24 hours) at 05/18/2021 1214 Last data filed at 05/18/2021 0816 Gross per 24 hour  Intake 420 ml  Output 450 ml  Net -30 ml         Physical Exam: Vital Signs Blood pressure (!) 120/52, pulse (!) 55, temperature 97.8 F (36.6 C), temperature source Oral, resp. rate 18, SpO2 100 %.   Constitutional: No distress . Vital signs reviewed. HEENT: NCAT, EOMI, oral membranes moist Neck: supple Cardiovascular: RRR without murmur. No JVD    Respiratory/Chest: CTA Bilaterally without wheezes or rales. Normal effort    GI/Abdomen: BS +, non-tender, non-distended Ext: no clubbing, cyanosis, or edema Psych: pleasant and cooperative  Skin: clean, dry Neurologic: more alert.  Follows basic commands with some delay. Cranial nerves II through XII intact, motor strength is 5/5 in right deltoid, bicep, tricep, grip,  and bilateral hip flexor, knee extensors, ankle dorsiflexor and plantar flexor. Prox left upper limited by pain Sensory exam normal sensation to light touch and proprioception in bilateral upper and lower extremities Cerebellar exam normal finger to nose to finger as well as heel to shin in bilateral upper and lower extremities Musculoskeletal: left shoulder remains tender at acromium with ER/IR once again, limits to ROM d/t pain/blocking   Assessment/Plan: 1. Functional deficits which require 3+ hours per day of  interdisciplinary therapy in a comprehensive inpatient rehab setting. Physiatrist is providing close team supervision and 24 hour management of active medical problems listed below. Physiatrist and rehab team continue to assess barriers to discharge/monitor patient progress toward functional and medical goals  Care Tool:  Bathing    Body parts bathed by patient: Left arm, Chest, Abdomen, Front perineal area, Right upper leg, Left upper leg, Face   Body parts bathed by helper: Right arm, Buttocks, Right lower leg, Left lower leg     Bathing assist Assist Level: Moderate Assistance - Patient 50 - 74%     Upper Body Dressing/Undressing Upper body dressing   What is the patient wearing?: Pull over shirt    Upper body assist Assist Level: Supervision/Verbal cueing    Lower Body Dressing/Undressing Lower body dressing      What is the patient wearing?: Pants     Lower body assist Assist for lower body dressing: Contact Guard/Touching assist     Toileting Toileting Toileting Activity did not occur (Clothing management and hygiene only): Refused  Toileting assist Assist for toileting: Maximal Assistance - Patient 25 - 49%     Transfers Chair/bed transfer  Transfers assist  Chair/bed transfer activity did not occur: Refused  Chair/bed transfer assist level: Minimal Assistance - Patient > 75%     Locomotion Ambulation  Ambulation assist      Assist level: Minimal Assistance - Patient > 75% Assistive device: Walker-rolling Max distance: 12'   Walk 10 feet activity   Assist     Assist level: Minimal Assistance - Patient > 75% Assistive device: Walker-rolling   Walk 50 feet activity   Assist Walk 50 feet with 2 turns activity did not occur: Safety/medical concerns  Assist level: Minimal Assistance - Patient > 75% Assistive device: Walker-rolling    Walk 150 feet activity   Assist Walk 150 feet activity did not occur: Safety/medical concerns          Walk 10 feet on uneven surface  activity   Assist Walk 10 feet on uneven surfaces activity did not occur: Safety/medical concerns         Wheelchair     Assist Is the patient using a wheelchair?: Yes Type of Wheelchair: Manual    Wheelchair assist level: Maximal Assistance - Patient 25 - 49% Max wheelchair distance: 20    Wheelchair 50 feet with 2 turns activity    Assist        Assist Level: Maximal Assistance - Patient 25 - 49% (per PT documentation)   Wheelchair 150 feet activity     Assist      Assist Level: Total Assistance - Patient < 25% (per PT documentation)   Blood pressure (!) 120/52, pulse (!) 55, temperature 97.8 F (36.6 C), temperature source Oral, resp. rate 18, SpO2 100 %.  Medical Problem List and Plan: 1. 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  Antithrombotics: -DVT/anticoagulation:  Pharmaceutical: Heparin             -antiplatelet therapy: Aspirin 81 mg daily and Plavix 75 mg day x3 weeks then Plavix alone 3. Pain Management: Tylenol as needed  -left shoulder appears improved.  -continue aspercreme for left shoulder, can use muscle rub also   -encouraged pt to work on his ROM    -ktape and ROM with therapy 4. Mood and sleep/wake:    -continue ritalin to help with arousal, motivation and processing speed- see a definite difference with it. Wean off after discharge             -antipsychotic agents: N/A 5. Neuropsych: This patient is capable of making decisions on his own behalf. 6. Skin/Wound Care: Routine skin checks 7. Fluids/Electrolytes/Nutrition:  encourage po  Push po -check bmet tomorrow    8.  Diabetes mellitus.  Hemoglobin A1c 9.9.  NovoLog 3 units 3 times daily, Levemir  units  daily.    CBG (last 3)  Recent Labs    05/17/21 2108 05/18/21 0605 05/18/21 1201  GLUCAP 166* 157* 161*    1/23  levemir 7u bid 9.  Hypertension.  Lisinopril 40 mg daily, Toprol-XL 50 mg daily, hydralazine 50 mg every 8 hours, Hygroton 25 mg daily.   Vitals:   05/18/21 0332 05/18/21 0730  BP: (!) 123/51 (!) 120/52  Pulse: (!) 54 (!) 55  Resp: 18   Temp: 97.8 F (36.6 C)   SpO2: 100%  toprol  100mg  qd,   hydralazine qid  -1/23 much improved with clonidine 0.1mg  bid 10.  Hyperlipidemia.  continue Lipitor 11.  History of gout.  continue Allopurinol.  Monitor for any gout flareups 12. Bradycardia: 50s-60s: continue to monitor HR TID.     LOS: 9 days A FACE TO FACE EVALUATION WAS PERFORMED  Meredith Staggers 05/18/2021, 12:14 PM

## 2021-05-18 NOTE — Progress Notes (Signed)
Physical Therapy Session Note  Patient Details  Name: Manuel Chapman MRN: [id number] Date of Birth: [birth year]  Today's Date: 05/18/2021 PT Individual Time: 1002-1058 PT Individual Time Calculation (min): 56 min   Short Term Goals: Week 1:  PT Short Term Goal 1 (Week 1): Pt will initiate stair training PT Short Term Goal 2 (Week 1): Pt will perform STS transfers with CGA PT Short Term Goal 3 (Week 1): Pt will ambulate x 50 ft with LRAD  Skilled Therapeutic Interventions/Progress Updates:     Pt received seated in WC and agrees to therapy. No complaint of pain. WC transport to gym for time management. Pt performs stand step transfer from [facility] to mat with RW and cues for hand placement and sequencing. Session focused on NMR for balance and L hemibody motor planning. Pt performs targeted toe tapping using L upper extremity for support on RW for NM feedback> PT provides cue for laterality and numbered target and pt taps toe. Pt has increased difficulty when attempting to tap with R foot but is able to complete with cues for weight shift and increased step height. Pt also cued to increase hip and trunk extension to improve posture and balance. PT provides CGA/minA for stability but pt does not have any overt LOBs. Activity progressed by adding 3 inch step and pt performing repeated toe taps with R lower extremity. 2x10. Challenge then added by having pt step up with R foot, then reach with R hand to retrieve playing cards from above head level. Performed to encourage increased trunk extension as well as functional movement while balancing in semi staggered stance. 1x10. Following seated rest break pt performs same activity in reverse, placing cards back at above-head-level. WC transport back to room. Left seated with alarm intact and all needs within reach.  Therapy Documentation Precautions:  Precautions Precautions: Fall Precaution Comments: Reports previous CVA with residual L  weakness Restrictions Weight Bearing Restrictions: No   Therapy/Group: Individual Therapy  Breck Coons, PT, DPT 05/18/2021, 3:56 PM

## 2021-05-18 NOTE — Progress Notes (Signed)
Occupational Therapy Weekly Progress Note  Patient Details  Name: Manuel Chapman MRN: [id number] Date of Birth: 1945/05/07  Beginning of progress report period: May 10, 2021 End of progress report period: May 18, 2021  Today's Date: 05/18/2021 Session 1 OT Individual Time: 7425-9563 OT Individual Time Calculation (min): 56 min   Session 2 OT Individual Time: 1302-1400 OT Individual Time Calculation (min): 58 min    Patient has met 3 of 4 short term goals.  Patient is making slow, but steady progress towards OT goals. Pt continues to be somewhat self-limiting with self-care and functional OT goals as pt had assistance from some BADLs at home and is happy to continue to let someone help him. Pt is making progress with functional strength to perform these functional tasks. Overall CGA for sit<>stands and transfers with a RW. L UE shoulder pain is improving and incorporating L UE into more functional tasks.  Patient continues to demonstrate the following deficits: muscle weakness, decreased cardiorespiratoy endurance, unbalanced muscle activation, decreased coordination, and decreased motor planning, decreased attention, decreased awareness, decreased problem solving, decreased safety awareness, decreased memory, and delayed processing, and decreased sitting balance, decreased standing balance, decreased postural control, hemiplegia, and decreased balance strategies and therefore will continue to benefit from skilled OT intervention to enhance overall performance with BADL and Reduce care partner burden.  Patient progressing toward long term goals..  Continue plan of care.  OT Short Term Goals Week 1:  OT Short Term Goal 1 (Week 1): Patient will complete 1/3 parts of toileting task with Min A and LRAD. OT Short Term Goal 1 - Progress (Week 1): Progressing toward goal OT Short Term Goal 2 (Week 1): Patient will don UB clothing with set-up assist in sitting. OT Short Term Goal 2 -  Progress (Week 1): Met OT Short Term Goal 3 (Week 1): Patient will don LB clothing with Min A and LRAD. OT Short Term Goal 3 - Progress (Week 1): Met OT Short Term Goal 4 (Week 1): Patient will complete toilet transfer with Min A and LRAD. OT Short Term Goal 4 - Progress (Week 1): Met Week 2:  OT Short Term Goal 1 (Week 2): Patient will complete 1 step of toileting task OT Short Term Goal 2 (Week 2): Patient will tolerate standing at the sink for 2 minutes in preparation for BADL Task OT Short Term Goal 3 (Week 2): Patient will demonstrate use of adaptive equipment for LB dressing with min questioning cues  Skilled Therapeutic Interventions/Progress Updates:   Session 1 Pt greeted semi-reclined in bed, awake, just finished breakfast and agreeable to OT treatment session. PT completed bed mobility with CGA to elevate trunk from [facility] raised. Pt declined to shower despite encouragement and OT reflecting on reported PLOF of showering at home. Pt agreeable to change brief and put on clothes. OT issued [place] reacher and educated on use to thread pant legs. PT able to complete with min cues and supervision. Sit<>stand with RW and CGA to pull up pants. Set-up A to don clean shirt. Pt brought to therapy gym and worked on L shoulder gentle stretching and ROM with towel pushes on table forward flex/ext, and cirlces clockwise and counter clockwise. OT applied kinesiotape on shoulder for pain management. Pt returned to room and left seated in wc with alarm belt  on, call bell in reach, and needs met.  Pain:  Mild L shoulder pain, rated 7/10, but did not report during session. OT applied kinesiotape, rest and repositioned.  Session 2  Pt greeted seated in wc and agreeable to OT treatment session. Pt agreeable to go to the bathroom to urinate. Pt had already had some urinary incontinence, but had continent void in commode in standing with RW and CGA. Pt able to manage clothing with min A. Pt reported he not been able  to reach his buttocks for years and gets in the shower to clean up after a BM. Pt then ambulated to the sink to wash hands with RW and CGA. While standing at the sink, pt able to wash peri-area and OT assisted pt with donning clean brief. OT practiced donning/doffing socks using reacher and sock-aid with demonstration and min verbal cues for teach back. Pt then brought to therapy gym and performed stand-pivot to NuStep. Pt completed 10 mins on NuStep on level 3 with 1 rest break. Pt returned to room and left seated in wc with alarm belt on, call bell in reach, and needs met.   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 Rest and repositioned.   Therapy/Group: Individual Therapy  Valma Cava 05/18/2021, 2:03 PM

## 2021-05-18 NOTE — Progress Notes (Signed)
Speech Language Pathology Daily Session Note  Patient Details  Name: Manuel Chapman MRN: [id number] Date of Birth: [birth year]  Today's Date: 05/18/2021 SLP Individual Time: 1430-1455 SLP Individual Time Calculation (min): 25 min  Short Term Goals: Week 2: SLP Short Term Goal 1 (Week 2): STGs=LTGs due to ELOS  Skilled Therapeutic Interventions: Skilled treatment session focused on dysphagia and cognitive goals. SLP facilitated session by providing overall mod A verbal cues for utilization of small sips with trials of thin liquids via cup. Patient continues to be impulsive resulting in left anterior spillage of liquids and delayed coughing in 50% of trials. Recommend patient continue with Provale cup to maximize safety with PO intake. SLP also facilitated session by providing overall Min verbal cues for recall of events from previous therapy sessions. Patient demonstrated improved awareness of deficits intermittently and reported, "I am just going to do what you all tell me to do, I am not going to fight it." Patient left upright in wheelchair with alarm on and all needs within reach. Continue with current plan of care.      Pain No/Denies Pain   Therapy/Group: Individual Therapy  Manuel Chapman 05/18/2021, 3:01 PM

## 2021-05-18 NOTE — Progress Notes (Addendum)
Patient ID: Manuel Chapman, male   DOB: 1945-11-02, 76 y.o.   MRN: [id number]  SW returned phone call/left message for [facility]/Admissions with [name] and waiting on follow-up.   SW left message for [employer] [phone] cell) to discuss referral and waiting on follow-up.   *SW spoke with [name] who reported they are not in network with any [employer] plans.   SW spoke with [name] [phone]) to discuss above. She will follow-up to discuss best decision.  *Intends to explore more SNF locations. SW reiterated that if selecting [facility] for rehab only, they will need to continue to search for a SNF with a long term care bed option and also unsure on how long insurance will approve for SNF placement if insurance gives auth.   Cecile Sheerer, MSW, LCSWA Office: [phone] Cell: [phone] Fax: [phone]

## 2021-05-19 LAB — BASIC METABOLIC PANEL
Anion gap: 7 (ref 5–15)
BUN: 33 mg/dL — ABNORMAL HIGH (ref 8–23)
CO2: 26 mmol/L (ref 22–32)
Calcium: 8.1 mg/dL — ABNORMAL LOW (ref 8.9–10.3)
Chloride: 102 mmol/L (ref 98–111)
Creatinine, Ser: 1.08 mg/dL (ref 0.61–1.24)
GFR, Estimated: >60 mL/min (ref >60)
Glucose, Bld: 137 mg/dL — ABNORMAL HIGH (ref 70–99)
Potassium: 4.8 mmol/L (ref 3.5–5.1)
Sodium: 135 mmol/L (ref 135–145)

## 2021-05-19 LAB — GLUCOSE, CAPILLARY
Glucose-Capillary: 125 mg/dL — ABNORMAL HIGH (ref 70–99)
Glucose-Capillary: 176 mg/dL — ABNORMAL HIGH (ref 70–99)
Glucose-Capillary: 149 mg/dL — ABNORMAL HIGH (ref 70–99)
Glucose-Capillary: 193 mg/dL — ABNORMAL HIGH (ref 70–99)

## 2021-05-19 MED ORDER — INSULIN DETEMIR 100 UNIT/ML ~~LOC~~ SOLN
9.0000 [IU] | Freq: Every day | SUBCUTANEOUS | Status: DC
  Administered 2021-05-20 – 2021-06-01 (×13): 9 [IU] via SUBCUTANEOUS
  Filled 2021-05-19 (×13): qty 0.09

## 2021-05-19 MED ORDER — METHYLPHENIDATE HCL 5 MG PO TABS
5.0000 mg | ORAL_TABLET | Freq: Every day | ORAL | Status: DC
  Administered 2021-05-20 – 2021-05-22 (×3): 5 mg via ORAL
  Filled 2021-05-19 (×3): qty 1

## 2021-05-19 NOTE — Progress Notes (Signed)
Speech Language Pathology Daily Session Note  Patient Details  Name: Manuel Chapman MRN: [id number] Date of Birth: [birth year]  Today's Date: 05/19/2021 SLP Individual Time: 0725-0805 SLP Individual Time Calculation (min): 40 min  Short Term Goals: Week 2: SLP Short Term Goal 1 (Week 2): STGs=LTGs due to ELOS  Skilled Therapeutic Interventions: Skilled treatment session focused on cognitive and dysphagia goals. Upon arrival, patient was asleep in bed and slow to rouse. Patient requested to use the urinal but required Mod-Max verbal cues to assist in managing brief and urinal throughout task. Patient was continent of urine. With supervision level verbal and visual cues, patient was oriented X 4. Patient's breakfast tray had not arrived to the unit yet but patient requested milk. Patient consumed milk from Provale cup without overt s/s of aspiration observed. Patient reported the cup was "slow" but SLP provided education regarding benefits and that patient consumed all 4 oz in a rather short time frame despite management of bolus size.  Patient with moderate left anterior spillage that required Min verbal cues to self-monitor and correct. Patient with decreased speech intelligibility today requiring Min verbal cues for use of a slow rate. Patient with minimal responsiveness to cues. Patient encouraged to get OOB to consume meal but patient declined. SLP provided tray set-up and left patient consuming his breakfast meal. Patient left upright in bed with alarm on and all needs within reach. Continue with current plan of care.      Pain No/Denies Pain   Therapy/Group: Individual Therapy  Kc Summerson 05/19/2021, 8:15 AM

## 2021-05-19 NOTE — Progress Notes (Signed)
Occupational Therapy Session Note  Patient Details  Name: Manuel Chapman MRN: [id number] Date of Birth: 1945-11-18  Today's Date: 05/19/2021 OT Individual Time: 0935-1030 OT Individual Time Calculation (min): 55 min    Short Term Goals: Week 2:  OT Short Term Goal 1 (Week 2): Patient will complete 1 step of toileting task OT Short Term Goal 2 (Week 2): Patient will tolerate standing at the sink for 2 minutes in preparation for BADL Task OT Short Term Goal 3 (Week 2): Patient will demonstrate use of adaptive equipment for LB dressing with min questioning cues  Skilled Therapeutic Interventions/Progress Updates:  Patient met lying supine in bed in agreement with OT treatment session. Mild soreness reported at rest and with activity in L shoulder. Supine to EOB with use of bed features and supervision A for safety. Increased time/effort. Patient initially declined ADLs. With coaxing/encouragement patient in agreement with completing grooming at sink level. CGA for sit to stand from EOB with cues for hand placement. Functional mobility to sink surface with RW and CGA. While completing grooming in standing patient became incontinent of bladder with urine spilling through brief and onto the floor. Patient then completed LB bathing/dressing with CGA-Min A in sitting/standing with use of reacher to doff/don pants. With sitting, patient with poorly controlled descent requiring cues for safety. Session concluded with patient seated in recliner with call bell within reach, chair alarm activated and all needs met.   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

## 2021-05-19 NOTE — Progress Notes (Signed)
PROGRESS NOTE   Subjective/Complaints: No new issues. Left shoulder "sore" but better than before. Still a little down about his psychosocial situation  ROS: Patient denies fever, rash, sore throat, blurred vision, nausea, vomiting, diarrhea, cough, shortness of breath or chest pain,   headache, or mood change.    Objective:   No results found. No results for input(s): WBC, HGB, HCT, PLT in the last 72 hours.  Recent Labs    05/19/21 0538  NA 135  K 4.8  CL 102  CO2 26  GLUCOSE 137*  BUN 33*  CREATININE 1.08  CALCIUM 8.1*     Intake/Output Summary (Last 24 hours) at 05/19/2021 1025 Last data filed at 05/19/2021 0836 Gross per 24 hour  Intake 840 ml  Output 300 ml  Net 540 ml         Physical Exam: Vital Signs Blood pressure (!) 143/65, pulse (!) 58, temperature 97.7 F (36.5 C), resp. rate 18, SpO2 100 %.   Constitutional: No distress . Vital signs reviewed. HEENT: NCAT, EOMI, oral membranes moist Neck: supple Cardiovascular: RRR without murmur. No JVD    Respiratory/Chest: CTA Bilaterally without wheezes or rales. Normal effort    GI/Abdomen: BS +, non-tender, non-distended Ext: no clubbing, cyanosis, or edema Psych: pleasant and cooperative  Skin: clean, dry Neurologic: more alert.  Follows basic commands with some delay. Cranial nerves II through XII intact, motor strength is 5/5 in right deltoid, bicep, tricep, grip,  and bilateral hip flexor, knee extensors, ankle dorsiflexor and plantar flexor. Prox left upper limited by pain Sensory exam normal sensation to light touch and proprioception in bilateral upper and lower extremities Cerebellar exam normal finger to nose to finger as well as heel to shin in bilateral upper and lower extremities Musculoskeletal: left shoulder with mild tenderness with PROM   Assessment/Plan: 1. Functional deficits which require 3+ hours per day of interdisciplinary  therapy in a comprehensive inpatient rehab setting. Physiatrist is providing close team supervision and 24 hour management of active medical problems listed below. Physiatrist and rehab team continue to assess barriers to discharge/monitor patient progress toward functional and medical goals  Care Tool:  Bathing    Body parts bathed by patient: Left arm, Chest, Abdomen, Front perineal area, Right upper leg, Face, Right arm, Left lower leg, Right lower leg, Left upper leg   Body parts bathed by helper: Right arm, Buttocks, Right lower leg, Left lower leg     Bathing assist Assist Level: Minimal Assistance - Patient > 75%     Upper Body Dressing/Undressing Upper body dressing   What is the patient wearing?: Pull over shirt    Upper body assist Assist Level: Set up assist    Lower Body Dressing/Undressing Lower body dressing      What is the patient wearing?: Pants     Lower body assist Assist for lower body dressing: Contact Guard/Touching assist Assistive Device Comment: reacher   Toileting Toileting Toileting Activity did not occur (Clothing management and hygiene only): Refused  Toileting assist Assist for toileting: Moderate Assistance - Patient 50 - 74%     Transfers Chair/bed transfer  Transfers assist  Chair/bed transfer activity did not  occur: Refused  Chair/bed transfer assist level: Contact Guard/Touching assist     Locomotion Ambulation   Ambulation assist      Assist level: Minimal Assistance - Patient > 75% Assistive device: Walker-rolling Max distance: 12'   Walk 10 feet activity   Assist     Assist level: Minimal Assistance - Patient > 75% Assistive device: Walker-rolling   Walk 50 feet activity   Assist Walk 50 feet with 2 turns activity did not occur: Safety/medical concerns  Assist level: Minimal Assistance - Patient > 75% Assistive device: Walker-rolling    Walk 150 feet activity   Assist Walk 150 feet activity did not  occur: Safety/medical concerns         Walk 10 feet on uneven surface  activity   Assist Walk 10 feet on uneven surfaces activity did not occur: Safety/medical concerns         Wheelchair     Assist Is the patient using a wheelchair?: Yes Type of Wheelchair: Manual    Wheelchair assist level: Maximal Assistance - Patient 25 - 49% Max wheelchair distance: 20    Wheelchair 50 feet with 2 turns activity    Assist        Assist Level: Maximal Assistance - Patient 25 - 49% (per PT documentation)   Wheelchair 150 feet activity     Assist      Assist Level: Total Assistance - Patient < 25% (per PT documentation)   Blood pressure (!) 143/65, pulse (!) 58, temperature 97.7 F (36.5 C), resp. rate 18, SpO2 100 %.  Medical Problem List and Plan: 1. 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 Interdisciplinary team conference today to discuss goals, barriers to discharge, and dc planning.     2.  Antithrombotics: -DVT/anticoagulation:  Pharmaceutical: Heparin             -antiplatelet therapy: Aspirin 81 mg daily and Plavix 75 mg day x3 weeks then Plavix alone 3. Pain Management: Tylenol as needed  -left shoulder appears improved.  -continue aspercreme for left shoulder -can use muscle rub also   -ktape and ROM with therapy 4. Mood and sleep/wake:    -continue ritalin to help with arousal, motivation and processing speed-    1/24 will try reducing to qam only             -antipsychotic agents: N/A 5. Neuropsych: This patient is capable of making decisions on his own behalf. 6. Skin/Wound Care: Routine skin checks 7.  Fluids/Electrolytes/Nutrition:     -BUN elevated---needs to take in more fluids--reiterate today 8.  Diabetes mellitus.  Hemoglobin A1c 9.9.  NovoLog 3 units 3 times daily, Levemir  units  daily.    CBG (last 3)  Recent Labs    05/18/21 1636 05/18/21 2040 05/19/21 0525  GLUCAP 153* 142* 125*    1/24  levemir 7u bid---increase to 9u bid 9.  Hypertension.  Lisinopril 40 mg daily, Toprol-XL 50 mg daily, hydralazine 50 mg every 8 hours, Hygroton 25 mg daily.   Vitals:   05/18/21 2011 05/19/21 0327  BP: (!) 154/63 (!) 143/65  Pulse: (!) 59 (!) 58  Resp: 16 18  Temp: 97.9 F (36.6 C) 97.7 F (36.5 C)  SpO2: 100% 100%       toprol  100mg  qd,   hydralazine qid  -1/24   improved with clonidine 0.1mg  bid--continue to follow 10.  Hyperlipidemia.  continue Lipitor 11.  History of gout.  continue Allopurinol.  Monitor for any gout flareups 12. Bradycardia: 50s-60s: continue to monitor HR TID.     LOS: 10 days A FACE TO FACE EVALUATION WAS PERFORMED  Ranelle OysterZachary T Dorie Ohms 05/19/2021, 10:25 AM

## 2021-05-19 NOTE — Progress Notes (Signed)
Physical Therapy Weekly Progress Note  Patient Details  Name: Manuel Chapman MRN: [id number] Date of Birth: 06-29-1945  Beginning of progress report period: May 10, 2022 End of progress report period: May 19, 2022  Today's Date: 05/19/2021 PT Individual Time: 5916-3846 PT Individual Time Calculation (min): 56 min   Patient has met 3 of 3 short term goals.  Pt is progressing well toward mobility goals, improving independence with bed mobility, balance, transfers, and ambulation. Pt still has L sided weakness and lack of motor control, but is generally mobilizing at CGA to supervision level.   Patient continues to demonstrate the following deficits muscle weakness, decreased cardiorespiratoy endurance, decreased motor planning, and decreased standing balance, decreased postural control, hemiplegia, and decreased balance strategies and therefore will continue to benefit from skilled PT intervention to increase functional independence with mobility.  Patient progressing toward long term goals..  Continue plan of care.  PT Short Term Goals Week 1:  PT Short Term Goal 1 (Week 1): Pt will initiate stair training PT Short Term Goal 1 - Progress (Week 1): Met PT Short Term Goal 2 (Week 1): Pt will perform STS transfers with CGA PT Short Term Goal 3 (Week 1): Pt will ambulate x 50 ft with LRAD PT Short Term Goal 3 - Progress (Week 1): Met Week 2:  PT Short Term Goal 1 (Week 2): STGs = LTGs  Skilled Therapeutic Interventions/Progress Updates:  Ambulation/gait training;Cognitive remediation/compensation;Discharge planning;DME/adaptive equipment instruction;Functional mobility training;Pain management;Psychosocial support;Splinting/orthotics;Therapeutic Activities;UE/LE Strength taining/ROM;Visual/perceptual remediation/compensation;Wheelchair propulsion/positioning;UE/LE Coordination activities;Therapeutic Exercise;Stair training;Skin care/wound management;Patient/family  education;Neuromuscular re-education;Functional electrical stimulation;Disease management/prevention;Community reintegration;Balance/vestibular training   Pt received seated in recliner and agrees to therapy. Reports some pain in L shoulder but says that pain has improved during stay. Number not provided. Pt provides rest breaks as needed to manage pain. Pt performs stand step transfer to [facility] with RW and cues for positioning, posture, and sequencing. WC transport to gym for time management. PT explains 6 Minute Walk test and pt agreeable to perform. Pt ambulates 165' in 4:45 seconds prior to requiring extended seated rest break. PT provides CGA and cues for upright gaze to improve posture and balance, and encouragement to attempt to increase gait velocity as pt does not appear to be providing full effort. Pt does not substantially change gait velocity despite cues.   Pt performs repeated step ups on 4 inch step with RW, leading with L foot and stepping back with R for maximal strengthening and NMR of L lower extremity. 1x10. Pt progresses to performing with 8 inch step with minA and cues for initiation and hip extension. 2x10. WC transport back to room. Stand step transfer to recliner with RW and cues to increase eccentric control of stand to sit. Left with alarm intact and all needs within reach.  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 Restrictions Weight Bearing Restrictions: No   Therapy/Group: Individual Therapy  Breck Coons, PT, DPT 05/19/2021, 2:32 PM

## 2021-05-19 NOTE — Patient Care Conference (Signed)
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 Alger Simons Social Worker Present: Loralee Pacas, [place] Nurse Present: Dorien Chihuahua, RN PT Present: Tereasa Coop, PT OT Present: Turner Daniels, OT SLP Present: Weston Anna, SLP PPS Coordinator present : Gunnar Fusi, SLP     Current Status/Progress Goal Weekly Team Focus  Bowel/Bladder   Incontinent bladder at times, Continent bowel LBM1/20  Gain continence bladder  timed toileting. Offer urinal PRN   Swallow/Nutrition/ Hydration   Regular textures with thin liquids, Min-Mod A for use of swallowing compensatory strategies  Mod I  use of swallowing strategies   ADL's             Mobility   supervision bed mobility, stand pivot transfers to [facility] with CGA, CGA gait x150' with RW  Supervision  L hemibody NMR, safety awareness, balance, ambulation   Communication   Min A  sup A  use of speech intelligibility strategies   Safety/Cognition/ Behavioral Observations  Mod A  Min A  orientation, safety, problem solving, awareness   Pain   Patient reports mild pain to Lt shoulder- On scheduled muscle rub cream  0/10  Assess pain q shift and PRN   Skin   skin intact. Some brusing noted to abdomen  no skin breakdown  Assess skin q shift and PRN     Discharge Planning:  Pt will d/c to SNF. SNF  pending bed offer and insurance auth.    Team Discussion: Shoulder pain better and MD fine tuning DM management. Progress limited by self limiting behaviors.  Patient on target to meet rehab goals: yes, currently CGA overall for PT. Needs max - mod - min- CGA for ADLs. Demos uncontrolled "plops" with sit - stand and stand - sit transfers. USing a provale cup to decrease volume of sips and decrease risk of aspiration.   *See Care Plan and progress notes for long and short-term goals.   Revisions to Treatment Plan:  N/A   Teaching Needs: Safety, medication management, transfers, toileting, etc.  Current Barriers to Discharge: Incontinence, Lack of/limited family support, and Behavior  Possible Resolutions to Barriers: SNF placement recommended     Medical Summary Current Status: improved left shoulder pain/rom. diabetes regimen being adjusted. bp under fair control. team providing ego support given his psychosocial issues  Barriers to Discharge: Medical stability   Possible Resolutions to [employer] Focus: daily assessment of labs and pt data, pain mgt   Continued Need for Acute Rehabilitation Level of Care: The patient requires daily medical management by a physician with specialized training in physical medicine and rehabilitation for the following reasons: Direction of a multidisciplinary physical rehabilitation program to maximize functional independence : Yes Medical management of patient stability for increased activity during participation in an intensive rehabilitation regime.: Yes Analysis of laboratory values and/or radiology reports with any subsequent need for medication adjustment and/or medical intervention. :  Yes   I attest that I was present, lead the team conference, and concur with the assessment and plan of the team.   Dorien Chihuahua B 05/19/2021, 3:00 PM

## 2021-05-19 NOTE — Progress Notes (Signed)
Patient ID: Manuel Chapman, male   DOB: 09-Feb-1946, 76 y.o.   MRN: [id number]  SW spoke with Manuel Chapman/Admissions with Manuel Chapman to discuss referral. Reports will review and will  respond in Epic.  *referral declined.    SW spoke with [name]/Admissions with [facility] [phone]) to discuss short term rehab beds with the family still willing to look for a facility. No beds this week, likely to be next week.  SW spoke with [name] [phone]) to discuss above. SW reiterated that placement is based on insurance auth, and unsure on how long pt can remain in SNF once he is admitted as again base don insurance auth. Reporting she would like SW to explore [name], and will f/u about other SNF locations.   SW left message for Manuel Chapman/Admissions with [place] about referral and waiting on follow-up. *SW received return phone call. Reports she will review referral and will follow-up or respond in Epic.    SW received phone call from [name]. SW informed that referral was initially declined due to no beds. Reports she was told after visiting possibility of bed offer. SW resent referral. Referral declined due to no beds.  SW made efforts to call [name] to update on above but VM not set up. SW will continue to make efforts.  *SW spoke with pt dtr to discuss above. SW informed her Manuel Chapman/Admissions with [place] would like her to visit facility tomorrow. Dtr will be there to visit around 11:30am.   Manuel Chapman, MSW, [place] Office: [phone] Cell: [phone] Fax: [phone]

## 2021-05-19 NOTE — Plan of Care (Signed)
°  Problem: RH Cognition - SLP Goal: RH LTG Patient will demonstrate orientation with cues Description:  LTG:  Patient will demonstrate orientation to person/place/time/situation with cues (SLP)   Flowsheets (Taken 05/19/2021 0620) LTG: Patient will demonstrate orientation using cueing (SLP): Minimal Assistance - Patient > 75% Note: Downgraded due to slow progress   Problem: RH Swallowing Goal: LTG Patient will consume least restrictive diet using compensatory strategies with assistance (SLP) Description: LTG:  Patient will consume least restrictive diet using compensatory strategies with assistance (SLP) Flowsheets (Taken 05/19/2021 0620) LTG: Pt Patient will consume least restrictive diet using compensatory strategies with assistance of (SLP): Minimal Assistance - Patient > 75% Note: Downgraded due to slow progress

## 2021-05-20 ENCOUNTER — Other Ambulatory Visit: Payer: Self-pay

## 2021-05-20 ENCOUNTER — Encounter (HOSPITAL_COMMUNITY): Payer: Self-pay | Admitting: Physical Medicine & Rehabilitation

## 2021-05-20 LAB — GLUCOSE, CAPILLARY
Glucose-Capillary: 87 mg/dL (ref 70–99)
Glucose-Capillary: 151 mg/dL — ABNORMAL HIGH (ref 70–99)
Glucose-Capillary: 99 mg/dL (ref 70–99)
Glucose-Capillary: 155 mg/dL — ABNORMAL HIGH (ref 70–99)

## 2021-05-20 NOTE — Discharge Summary (Signed)
Physician Discharge Summary  ?Patient ID: ?Manuel Chapman ?MRN: [id number] ?DOB/AGE: [birth year] 76 y.o. ? ?Admit date: 05/09/2021 ?Discharge date: 07/01/2021 ? ?Discharge Diagnoses:  ?Principal Problem: ?  Thrombotic cerebral infarction [facility]) ?Active Problems: ?  Tendonitis of left rotator cuff ?  Labile blood glucose ?  Uncontrolled type 2 diabetes mellitus with hyperglycemia ([place]) ?  Essential hypertension ?DVT prophylaxis ?Mood stabilization ?Hypertension ?Diabetes mellitus ?Hyperlipidemia ?History of gout ?Bradycardia ? ?Discharged Condition: Stable ? ?Significant Diagnostic Studies: ?No results found. ? ?Labs:  ?Basic Metabolic Panel: ?No results for input(s): NA, K, CL, CO2, GLUCOSE, BUN, CREATININE, CALCIUM, MG, PHOS in the last 168 hours. ? ? ? ?CBC: ?No results for input(s): WBC, NEUTROABS, HGB, HCT, MCV, PLT in the last 168 hours. ? ?CBG: ?Recent Labs  ?Lab 06/30/21 ?1143 06/30/21 ?1634 06/30/21 ?2144 07/01/21 ?[id number] 07/01/21 ?1127  ?GLUCAP 137* 98 198* 113* 137*  ? ?Family history.  Negative for diabetes or CVA.  Denies any colon cancer esophageal cancer or rectal cancer ? ?Brief HPI:   Manuel Chapman is a 76 y.o. right-handed male with history of hypertension diabetes mellitus ICH left subcortical and right temporal embolic infarct [id number] was cleared to begin low-dose aspirin at discharge, quit smoking 25 years ago.  Per chart review lives with a friend.  Presented 05/05/2021 with dizziness and vomiting x3 days.  CT/MRI showed large region of acute early subacute infarct affecting the inferior cerebellum on the right consistent with right PICA territory infarction.  Petechial blood products but no frank hematoma.  Mass-effect upon the fourth ventricle without evidence of obstruction.  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  No large vessel occlusion or stenosis.   Admission chemistries unremarkable except glucose 300, urine drug screen negative.  Echocardiogram with ejection fraction of 45 to A999333 grade 2 diastolic dysfunction.  Neurology follow-up maintained on aspirin and Plavix DAPT for 3 weeks then Plavix alone.  Patient declined loop recorder plan for 30-day cardiac event monitor as outpatient.  Subcutaneous heparin for DVT prophylaxis.  Therapy evaluations completed due to patient decreased functional mobility was admitted for a comprehensive rehab program. ? ? ?Hospital Course: Manuel Chapman was admitted to rehab 05/09/2021 for inpatient therapies to consist of PT, ST and OT at least three hours five days a week. Past admission physiatrist, therapy team and rehab RN have worked together to provide customized collaborative inpatient rehab.  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  He had been cleared for aspirin and Plavix x3 weeks then Plavix alone per neurology services.  Mood stabilization with the addition of Ritalin to help patient maintain focus and attention to tasks.  Subcutaneous heparin for DVT prophylaxis.  Arrangements have been made with cardiology services for 30-day cardiac event monitor.  Blood sugars monitored hemoglobin A1c 9.9 insulin therapy as directed with diabetic teaching.  Blood pressure controlled on present regimen multi antihypertensive medications would need close monitoring.  Lipitor ongoing for hyperlipidemia.  He did have a history of gout maintained on allopurinol monitoring for any signs of fluid overload.  Asymptomatic bradycardia 50s-60s continue to monitor. ? ? ?Blood pressures were monitored on TID basis and controlled ? ?Diabetes has been monitored with ac/hs CBG checks and SSI was use prn for tighter BS control.  ? ? ?Rehab course: During patient's  stay in rehab weekly team conferences were held to monitor patient's progress,  set goals and discuss barriers to discharge. At admission, patient required +2 physical assist stand pivot transfers moderate assist supine to sit ? ?Physical exam.  Blood pressure 142/77 pulse 81 temperature 98.5 respirations 20 oxygen saturations 95% room air ?Constitutional.  No acute distress ?Neurologic.  Alert makes eye contact with examiner.  Very mild dysarthria.  Follows commands.  Provides name and age.  He did have some selective attention ?HEENT ?Head.  Normocephalic and atraumatic ?Eyes.  Pupils round and reactive to light no discharge without nystagmus ?Neck.  Supple nontender no JVD without thyromegaly ?Cardiac regular rate and rhythm any extra sounds or murmur heard ?Abdomen.  Soft nontender positive bowel sounds without rebound ?Respiratory effort normal no respiratory distress without wheeze ?Extremity.  No clubbing cyanosis or edema ?Musculoskeletal.  Cranial nerves II through XII intact motor strength 5/5 in left and 4/5 in right deltoid bicep tricep grip hip flexor knee extensors ankle dorsi plantarflexion.  Sensation intact to light touch. ? ?He/She  has had improvement in activity tolerance, balance, postural control as well as ability to compensate for deficits. He/She has had improvement in functional use RUE/LUE  and RLE/LLE as well as improvement in awareness.  Patient overall good functional gains generally mobilizing in a contact-guard to supervision level.  Performs repeated step ups and 4 inch step with rolling walker.  Functional ability to sink surface rolling walker contact-guard.  Completing grooming in standing patient monitor closely for loss of balance.  Contact-guard minimal assist in sitting standing with use of reacher to doff and don pants.  Needed mod max verbal cues to assist in managing brief and urinal throughout tasks.  Due to limited assistance at home skilled nursing facility was recommended patient discharged to skilled nursing facility. ? ? ? ?  ? ?Disposition:  Discharge to skilled nursing facility ? ? ? ?Diet: Carb modified diet ? ?Special Instructions: No driving smoking or alcohol ? ?Medications at discharge ?1.  Tylenol as needed ?2.  Allopurinol 300 mg p.o. daily ?3.  Lipitor 80 mg p.o. daily ?4.  Hygroton 25 mg p.o. daily ?5.  Clonidine 0.2 mg p.o. TID ?6.  Hydralazine 100 mg p.o. 4 times daily ?7.  Levemir 10 units subcutaneous Daily at 2000 and 12 units at 0800 hour ?8  Lisinopril 40 mg p.o. daily ? 9.  Multivitamin daily ?10.  Toprol-XL 25 mg p.o. daily ?11.  Plavix 75 mg p.o. daily ? ? ? ?30-35 minutes were spent completing discharge summary and discharge planning ? ? ?Discharge Instructions   ? ? Ambulatory referral to Neurology   Complete by: As directed ?  ? An appointment is requested in approximately: 4 weeks right PICA infarction  ? Ambulatory referral to Physical Medicine Rehab   Complete by: As directed ?  ? Moderate complexity follow-up 1 month right PICA infarction.  Patient for skilled nursing facility placement  ? ?  ? ? ? Follow-up Information   ? ? Meredith Staggers, MD Follow up.   ?Specialty: Physical Medicine and Rehabilitation ?Why: Office to call for appointment ?Contact information: ?[address] ?[address] ?[place] [address] ?[phone] ? ? ?  ?  ? ?  ?  ? ?  ? ? ?Signed: ?Lavon Paganini Syler Norcia ?07/01/2021, 1:27 PM ?  ?

## 2021-05-20 NOTE — Progress Notes (Signed)
Physical Therapy Session Note  Patient Details  Name: Tupac Jeffus MRN: [id number] Date of Birth: 02-Aug-1945  Today's Date: 05/20/2021 PT Individual Time: 0761-5183 PT Individual Time Calculation (min): 39 min   Short Term Goals: Week 2:  PT Short Term Goal 1 (Week 2): STGs = LTGs  Skilled Therapeutic Interventions/Progress Updates: Pt presented in recliner agreeable to therapy with encouragement. Pt denies pain at start of session. Pt noted to have soiled gown and no pants on. PTA obtained new gown and scrub pants. PTA assisted in donning pants and gown and pt performed Sit to stand from recliner with CGA and required minA to pull pants completely over hips. Pt then ambulated ~37f to w/c with CGA and significantly decreased cadence. Pt transported to NuStep and performed ambulatory transfer to NuStep CGA with RW. Pt participated in NuStep L5 x 10 min. Pt noted to maintain speed ~30 - 40 SPM. Once completed pt returned to w/c in same manner as prior. Pt transported partial way back to room and ambulated remaining distance ~52fwith RW and CGA. Pt noted to have decreased self selected gait speed and forward flexed posture. Upon return to room pt returned to recliner and left in recliner with seat alarm on, call bell within reach and current needs met.      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

## 2021-05-20 NOTE — Progress Notes (Signed)
Physical Therapy Session Note  Patient Details  Name: Manuel Chapman MRN: [id number] Date of Birth: 1946-02-15  Today's Date: 05/20/2021 PT Individual Time: 1032-1128 PT Individual Time Calculation (min): 56 min   Short Term Goals: Week 2:  PT Short Term Goal 1 (Week 2): STGs = LTGs  Skilled Therapeutic Interventions/Progress Updates:     Pt received seated in recliner and agrees to therapy. Reports some pain in L shoulder but improved from previous in admission. Number not provided. PT provides education and rest breaks to manage pain. Pt performs stand step transfer to [facility] with RW and cues to maintain upright posture for improved balance, and increasing eccentric control of stand to sit for safety and strengthening. WC transport to gym for time management. PT explains 6 Minute Walk Test and pt is agreeable to attempt. Pt completes with RW and CGA, with cues to increase gait speed as pt does not appear to be providing full effort. Pt says that he cannot walk any faster. Pt ambulates 173' in 5:14 prior to requiring seated rest break. PT then tasks pt with ambulating 173' again, "as quickly and safely as possible." Pt able to complete in 4:24 with PT providing cues for upright posture to improve balance, and decreasing WB through RW for energy conservaiton and decreased strain through L shoulder. Following extended seated rest break, pt attempts again and completes in 3:58 seconds. Pt initially ambulates with much improved speed but after ~20', slows down noticeably.  Wc transport back to room. Stand step transfer to bed with RW and cues to increase eccentric control of stand to sit. Left seated with alarm intact and all needs within reach.  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

## 2021-05-20 NOTE — Progress Notes (Signed)
Occupational Therapy Note  Patient Details  Name: Manuel Chapman MRN: [id number] Date of Birth: 11/30/1945  Today's Date: 05/20/2021 OT Missed Time: 60 Minutes Missed Time Reason: Patient fatigue;Patient unwilling/refused to participate without medical reason;Other (comment) (pt reports fatigue and wanting to be left alone)  Pt received supine in bed reporting fatigue and not wanting to be bothered at this time. Offered ADLs and OOB mobility with pt declining, RN aware. Will f/u as time allows to make up for missed minutes.    [name] 05/20/2021, 8:41 AM

## 2021-05-20 NOTE — Progress Notes (Signed)
SLP Cancellation Note  Patient Details Name: Manuel Chapman MRN: [id number] DOB: 1945-07-07   Cancelled treatment:       Patient missed 30 minutes of skilled SLP intervention due to refusal. Patient asleep with the blanket over his head and reported it was too early. Will re-attempt as able.                                                                                                 Aasiyah Auerbach 05/20/2021, 9:46 AM

## 2021-05-20 NOTE — Progress Notes (Addendum)
Patient ID: Manuel Chapman, male   DOB: 1946-03-01, 76 y.o.   MRN: [id number]  SW received message from pt dtr [name] [phone]) asking SW to follow-up with [facility] about potential bed.   SW spoke with Kelly/Admissions with [employer] to discuss pt referral. Reports there are short term rehab beds only. States family will have to call and discuss plan of care before able to accept.   SW spoke with pt dtr [name] to discuss above. Expresses concerns about being frustrated due to difficulties with finding a SNF location. Asked SW to resend referral to [facility] after meeting with admissions. SW resent referral. She intends to f/u with Tracy/Admissions at [facility].   SW spoke with Michelle/Admissions with [facility] [phone]) to discuss acceptance. Reports able to accept as long as pt has a pending Medicaid ID#. SW informed will follow-up with pt dtr.   SW spoke with pt dtr [name] to inform on above. Reports she has Medicaid pending ID#. SW informed will f/u with assigned Medicaid CM. SW spoke with [facility] DSS Medicaid CM Melissa Gentle [phone]). SW confirms pt has pending  Medicaid ID# [id number] O. She encouraged patient family  to call her if they have questions/concerns. SW left message for Michelle/Admissions with [facility] providing updates and waiting on if able to accept and submit auth. SW updated pt dtr [name] and her husband on above.  *SW received return phone call from Michelle/[facility] reporting they are able to extend a bed offer under [facility] pending insurance approval, and then if pt is accepted, they will have to  private pay for his stay until the LTC Medicaid is accepted as unsure on how long insurance will approve his stay for short term rehab.   SW spoke with Mae P ([phone]) to submit SNF auth. Reports request is being transferred to [facility]. SW spoke with Lisa/Pre-service  Coordinator with [facility] [phone]) to discuss if she sees pending auth. Reports she does see it. SW requested for auth to be expedited. She stated she will review as quickly as possible once clinicals received. SW faxed clinicals.  *Case BH#[id number]  SW spoke with pt dtr [name] and her husband to inform on above, and will follow-up once there is an update.   Cecile Sheerer, MSW, LCSWA Office: [phone] Cell: [phone] Fax: [phone]

## 2021-05-21 LAB — GLUCOSE, CAPILLARY
Glucose-Capillary: 96 mg/dL (ref 70–99)
Glucose-Capillary: 163 mg/dL — ABNORMAL HIGH (ref 70–99)
Glucose-Capillary: 147 mg/dL — ABNORMAL HIGH (ref 70–99)
Glucose-Capillary: 52 mg/dL — ABNORMAL LOW (ref 70–99)
Glucose-Capillary: 55 mg/dL — ABNORMAL LOW (ref 70–99)
Glucose-Capillary: 102 mg/dL — ABNORMAL HIGH (ref 70–99)
Glucose-Capillary: 107 mg/dL — ABNORMAL HIGH (ref 70–99)

## 2021-05-21 NOTE — Progress Notes (Signed)
Pt's BS at 2148 - 55, hypoglycemia protocol initiated. Patient's BS was re-checked at 22:04 - 52. Patent AxOx4. OJ and snacks in progress. Re-checked BS again at 22:47- 102. Spoke to om call NP and clarifed order for Levimir. Np ordered to re-check pt's BS at 23:30 and if he is maintaining >=102 , administer Levimir as scheduled and re-check Bs again at 3am.

## 2021-05-21 NOTE — Progress Notes (Addendum)
Patient ID: Manuel Chapman, male   DOB: 10-04-1945, 76 y.o.   MRN: [id number]  SW received message from Lisa/Pre-service Coordinator with [facility] [phone]) reporting that clinicals were received and all information goes to MD for review and will follow-up once there is more information.  *SW received message from [place] indicating referral was declined  as a lower level of care is recommended as there is no daily skilled need, and HH intermittent more appropriate. Family has the option to appeal [phone] option #3.   SW spoke with pt dtr [name] to inform on above. SW provided contact information for appeals. Family remains interested in SNF placement. SW did discuss that they will have to discuss payment arrangement with facility if insurance declines the appeal.   SW left message for SW spoke with Michelle/Admissions with [facility] [phone]) to inform on above, and SW will follow-up once there is more information. *SW received message from Michelle/Admissions with [facility] who reported she spoke with pt dtr and informed that corporate rate/product pay requires 30 day deposit upfront for room and board at $273.50 per day, totaling $8205.00 for the month. States if the LTC Medicaid is approved, than they would receive a refund for any over amount paid.   SW spoke with pt SIL who asked if SW could expedite LTC Medicaid. SW encouraged him to call the MCD CM- Melissa to discuss if this possible.  Cecile Sheerer, MSW, LCSWA Office: [phone] Cell: [phone] Fax: [phone]

## 2021-05-21 NOTE — Progress Notes (Signed)
Physical Therapy Session Note  Patient Details  Name: Manuel Chapman MRN: [id number] Date of Birth: 1945-10-27  Today's Date: 05/21/2021 PT Individual Time: 0903-0959 PT Individual Time Calculation (min): 56 min  and Today's Date: 05/21/2021 PT Missed Time: 60 Minutes Missed Time Reason: Patient unwilling to participate  Short Term Goals: Week 2:  PT Short Term Goal 1 (Week 2): STGs = LTGs  Skilled Therapeutic Interventions/Progress Updates:     Pt received seated in recliner and agrees to therapy. Reports some mild soreness in L shoulder. Number not provided. PT provides rest breaks and mobility to manage pain. Pt performs stand step transfer to [facility] with RW and cues for positioning. WC transport to gym for time management. Stand step to Nustep with RW. Pt completes Nustep for 15:00 at workload of 5 with average steps per minute ~40. Pt does much better job of maintaining L hand grip on handle relative to previous attempt at [employer].   Pt performs sit to stand with cues for hand placement and body mechanics. Pt ambulates from dayroom back to room on 5th floor, including navigating elevator. PT provides verbal cues for upright gaze to improve posture and balance and encouraging pt to increase gait speed to decrease risk for falls. Total ambulation 500'. Left seated in recliner with alarm intact and all needs within reach.  2nd Session: Pt received seated in recliner asleep. PT awakens pt and pt refuses therapy at this time, requesting to rest. PT will follow up as able.  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

## 2021-05-21 NOTE — Progress Notes (Signed)
Occupational Therapy Session Note  Patient Details  Name: Manuel Chapman MRN: [id number] Date of Birth: 1946-01-14  Today's Date: 05/21/2021 OT Individual Time: 8209-9068 OT Individual Time Calculation (min): 56 min    Short Term Goals: Week 2:  OT Short Term Goal 1 (Week 2): Patient will complete 1 step of toileting task OT Short Term Goal 2 (Week 2): Patient will tolerate standing at the sink for 2 minutes in preparation for BADL Task OT Short Term Goal 3 (Week 2): Patient will demonstrate use of adaptive equipment for LB dressing with min questioning cues  Skilled Therapeutic Interventions/Progress Updates:    Pt greeted semi-reclined in bed asleep. Pt needed much more than reasonable amount of time and a lot of encouragement to get out of bed this morning, eventually agreeable. Pt completed bed mobility with HOB elevated and min A. Pt adamantly declined to shower or go to the bathroom as pt was already incontinent of urine. Pt agreeable to bathing/dressing tasks at EOB. Overall set-up A for UB bathing/dressing at EOB with some improved ROM for functional use of L UE to wash underarms. Pt able to stand at EOB with close supervision, then wash peri-area in standing, but needed OT assist to wash buttocks as pt stated he could not do this PTA and would not even attempt it. Pt able to thread pant legs at EOB with increased time and min cues for technique. Supervision sit<>stand w/ RW to pull up pants, pt then pivoted to recliner with RW and supervision. OT doffed old kinesiotape and placed new kinesiotape for L shoulder pain. Gentle active and assisted ROM of L UE for pain management. Pt left seated in recliner with chair alarm on, call bell in reach, and needs met.   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

## 2021-05-21 NOTE — Progress Notes (Signed)
Speech Language Pathology Daily Session Note  Patient Details  Name: Manuel Chapman MRN: [id number] Date of Birth: [birth year]  Today's Date: 05/21/2021 SLP Individual Time: 3846-6599 SLP Individual Time Calculation (min): 10 min and Today's Date: 05/21/2021 SLP Missed Time: 20 Minutes Missed Time Reason: Patient unwilling to participate;Patient fatigue  Short Term Goals: Week 2: SLP Short Term Goal 1 (Week 2): STGs=LTGs due to ELOS  Skilled Therapeutic Interventions: Skilled treatment session focused on cognitive goals. Upon arrival, patient was sitting upright in the recliner. Patient verbalized frustration about sitting up "all day" and requested to go back to bed. SLP provided encouragement to participate in therapy. Patient reported he had already "worked hard" today and that "you all can go to hell." SLP transferred the patient back to bed with Min A with the RW. Patient left supine in bed with alarm on and all needs within reach. Continue with current plan of care.      Pain Pain in Left shoulder, unable to rate. Patient repositioned   Therapy/Group: Individual Therapy  Atara Paterson 05/21/2021, 3:19 PM

## 2021-05-21 NOTE — Progress Notes (Signed)
PROGRESS NOTE   Subjective/Complaints: Left shoulder about the same. Able to sleep. Making some progress with mobility.   ROS: Patient denies fever, rash, sore throat, blurred vision, dizziness, nausea, vomiting, diarrhea, cough, shortness of breath or chest pain,, headache, or mood change.    Objective:   No results found. No results for input(s): WBC, HGB, HCT, PLT in the last 72 hours.  Recent Labs    05/19/21 0538  NA 135  K 4.8  CL 102  CO2 26  GLUCOSE 137*  BUN 33*  CREATININE 1.08  CALCIUM 8.1*     Intake/Output Summary (Last 24 hours) at 05/21/2021 1147 Last data filed at 05/21/2021 0700 Gross per 24 hour  Intake 714 ml  Output 850 ml  Net -136 ml         Physical Exam: Vital Signs Blood pressure (!) 124/47, pulse (!) 57, temperature 98.2 F (36.8 C), temperature source Oral, resp. rate 18, weight 90 kg, SpO2 100 %.   Constitutional: No distress . Vital signs reviewed. HEENT: NCAT, EOMI, oral membranes moist Neck: supple Cardiovascular: RRR without murmur. No JVD    Respiratory/Chest: CTA Bilaterally without wheezes or rales. Normal effort    GI/Abdomen: BS +, non-tender, non-distended Ext: no clubbing, cyanosis, or edema Psych: pleasant and cooperative  Skin: clean, dry Neurologic: more alert.  Follows basic commands with some delay. Cranial nerves II through XII intact, motor strength is 5/5 in right deltoid, bicep, tricep, grip,  and bilateral hip flexor, knee extensors, ankle dorsiflexor and plantar flexor. Prox left upper limited by pain--stable exam Sensory exam normal sensation to light touch and proprioception in bilateral upper and lower extremities Cerebellar exam normal finger to nose to finger as well as heel to shin in bilateral upper and lower extremities Musculoskeletal: left shoulder with mild tenderness with PROM--he can abduct to about 80 degrees without  assist   Assessment/Plan: 1. Functional deficits which require 3+ hours per day of interdisciplinary therapy in a comprehensive inpatient rehab setting. Physiatrist is providing close team supervision and 24 hour management of active medical problems listed below. Physiatrist and rehab team continue to assess barriers to discharge/monitor patient progress toward functional and medical goals  Care Tool:  Bathing    Body parts bathed by patient: Left arm, Chest, Abdomen, Front perineal area, Right upper leg, Face, Right arm, Left lower leg, Right lower leg, Left upper leg   Body parts bathed by helper: Right arm, Buttocks, Right lower leg, Left lower leg     Bathing assist Assist Level: Minimal Assistance - Patient > 75%     Upper Body Dressing/Undressing Upper body dressing   What is the patient wearing?: Pull over shirt    Upper body assist Assist Level: Set up assist    Lower Body Dressing/Undressing Lower body dressing      What is the patient wearing?: Pants     Lower body assist Assist for lower body dressing: Contact Guard/Touching assist Assistive Device Comment: reacher   Toileting Toileting Toileting Activity did not occur (Clothing management and hygiene only): Refused  Toileting assist Assist for toileting: Moderate Assistance - Patient 50 - 74%     Transfers Chair/bed  transfer  Transfers assist  Chair/bed transfer activity did not occur: Refused  Chair/bed transfer assist level: Contact Guard/Touching assist     Locomotion Ambulation   Ambulation assist      Assist level: Minimal Assistance - Patient > 75% Assistive device: Walker-rolling Max distance: 12'   Walk 10 feet activity   Assist     Assist level: Minimal Assistance - Patient > 75% Assistive device: Walker-rolling   Walk 50 feet activity   Assist Walk 50 feet with 2 turns activity did not occur: Safety/medical concerns  Assist level: Minimal Assistance - Patient >  75% Assistive device: Walker-rolling    Walk 150 feet activity   Assist Walk 150 feet activity did not occur: Safety/medical concerns         Walk 10 feet on uneven surface  activity   Assist Walk 10 feet on uneven surfaces activity did not occur: Safety/medical concerns         Wheelchair     Assist Is the patient using a wheelchair?: Yes Type of Wheelchair: Manual    Wheelchair assist level: Maximal Assistance - Patient 25 - 49% Max wheelchair distance: 20    Wheelchair 50 feet with 2 turns activity    Assist        Assist Level: Maximal Assistance - Patient 25 - 49% (per PT documentation)   Wheelchair 150 feet activity     Assist      Assist Level: Total Assistance - Patient < 25% (per PT documentation)   Blood pressure (!) 124/47, pulse (!) 57, temperature 98.2 F (36.8 C), temperature source Oral, resp. rate 18, weight 90 kg, SpO2 100 %.  Medical Problem List and Plan: 1. 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  Antithrombotics: -DVT/anticoagulation:  Pharmaceutical: Heparin             -antiplatelet therapy: Aspirin 81 mg daily and Plavix 75 mg day x3 weeks then Plavix alone 3. Pain Management: Tylenol as needed  -left shoulder appears improved.  -continue aspercreme for left shoulder -can use muscle rub also   -k-tape and ROM with therapy 4. Mood and sleep/wake:    -continue ritalin to help with arousal, motivation and processing speed-    1/26 will dc ritalin and observe             -antipsychotic agents: N/A 5. Neuropsych: This patient is capable of making decisions on his own behalf. 6. Skin/Wound Care: Routine skin  checks 7. Fluids/Electrolytes/Nutrition:     -BUN elevated---have asked pt to push fluids--recheck in AM 1/27 8.  Diabetes mellitus.  Hemoglobin A1c 9.9.  NovoLog 3 units 3 times daily, Levemir  units  daily.    CBG (last 3)  Recent Labs    05/20/21 2056 05/21/21 0627 05/21/21 1136  GLUCAP 155* 96 163*    1/26  levemir  increased to 9u bid--improving control 9.  Hypertension.  Lisinopril 40 mg daily, Toprol-XL 50 mg daily, hydralazine 50 mg every 8 hours, Hygroton 25 mg daily.   Vitals:   05/20/21 1927 05/21/21 0431  BP: (!) 142/61 (!) 124/47  Pulse: (!) 56 (!) 57  Resp: 18 18  Temp: 98.7 F (37.1 C) 98.2 F (36.8 C)  SpO2: 99% 100%       toprol  100mg  qd,   hydralazine qid  -1/24   improved with clonidine 0.1mg  bid--continue to follow 10.  Hyperlipidemia.  continue Lipitor 11.  History of gout.  continue Allopurinol.  Monitor for any gout flareups 12. Bradycardia: 50s-60s: continue to monitor HR TID.     LOS: 12 days A FACE TO FACE EVALUATION WAS PERFORMED  Meredith Staggers 05/21/2021, 11:47 AM

## 2021-05-22 LAB — GLUCOSE, CAPILLARY
Glucose-Capillary: 113 mg/dL — ABNORMAL HIGH (ref 70–99)
Glucose-Capillary: 86 mg/dL (ref 70–99)
Glucose-Capillary: 81 mg/dL (ref 70–99)
Glucose-Capillary: 154 mg/dL — ABNORMAL HIGH (ref 70–99)
Glucose-Capillary: 150 mg/dL — ABNORMAL HIGH (ref 70–99)

## 2021-05-22 LAB — SARS CORONAVIRUS 2 (TAT 6-24 HRS): SARS Coronavirus 2: NEGATIVE

## 2021-05-22 MED ORDER — METHYLPHENIDATE HCL 5 MG PO TABS
5.0000 mg | ORAL_TABLET | Freq: Two times a day (BID) | ORAL | Status: DC
  Administered 2021-05-22 – 2021-06-05 (×29): 5 mg via ORAL
  Filled 2021-05-22 (×29): qty 1

## 2021-05-22 NOTE — Progress Notes (Addendum)
Occupational Therapy Session Note  Patient Details  Name: Manuel Chapman MRN: [id number] Date of Birth: 1945/08/30  Today's Date: 05/22/2021 Session 1 OT Individual Time: 0762-2633 OT Individual Time Calculation (min): 57 min   Session 2 OT Individual Time: 3545-6256 OT Individual Time Calculation (min): 70 min   Short Term Goals: Week 2:  OT Short Term Goal 1 (Week 2): Patient will complete 1 step of toileting task OT Short Term Goal 2 (Week 2): Patient will tolerate standing at the sink for 2 minutes in preparation for BADL Task OT Short Term Goal 3 (Week 2): Patient will demonstrate use of adaptive equipment for LB dressing with min questioning cues  Skilled Therapeutic Interventions/Progress Updates:  Session 1   Pt greeted semi-reclined in bed getting meds by nursing. Pt resistant to get out of bed, but eventually agreeable with encouragement. OT encouraged pt to shower this morning but he continued to refuse, however agreeable to washing at EOB and getting dressed. UB bathing/dressing at EOB with overall set-up A and improved ROM of L arm to reach to wash R arm. OT asked pt multiple times to try to go to the bathroom but he stated he did not have to. Pt also stated he did not need to wash peri-area because nursing had washed him really well. Upon standing, noted pt was soaked with urine through bed pad and with smear of bowel incontinence. Pt unaware. OT removed soiled brief in standing, then pt with incontinent void onto floor. OT able to place urinal quickly. Pt returned to sitting for lower body bathing. Pt able to wash upper legs and peri-area with OT assist for lower legs and buttocks. Pt with more BM incontinence while wiping so OT able to get pt to ambulate to [facility] over toilet with RW and CGA. Pt unable to have more BM in commode but was still having BM with every wipe. Eventually OT able to get pt clean. He pulled up pants with min A and ambulated to recliner with RW and CGA.  PT left seated in recliner with chair alarm on, call bell in reach, and needs met.  Pain:  Denies pain  Session 2 Pt greeted seated in wc with brother present. Pt reported his brother came up and shaved him. Pt pleasant and enjoyed time with his brother this afternoon. Pt's brother left and OT noted urine on the floor. Pt incontinent of urine soaked through brief and clothing, and unaware. OT discussed trying to put pt on timed toileting schedule, however he is usually resistive to actually trying to go to the bathroom. Sit<>stands with RW an CGA. Pt tolerated standing while OT removed brief. Pt was able to assist with washing front peri-area, but needed OT assist to clean posterior peri area. Smears of BM when cleaning him up, but continued to decline to get to the commode. OT had pt sit down while OT obtained clean scrub pants. Upon OT return, pt had already been incontinent of urine again in clean brief. A second round of peri-care completed in standing to don clean brief. Pt then brought to therapy gym and transferred to NuStep with [name]. Pt completed 20 minutes on NuStep on level 3 without rest break. Pt returned to room and agreeable to stay up in wc. Pt left with alarm belt on, call bell in reach and needs met.   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

## 2021-05-22 NOTE — Progress Notes (Signed)
Patient ID: Jayvon Mounger, male   DOB: 31-Dec-1945, 76 y.o.   MRN: [id number]  SW spoke with [facility] [phone] option #3/f: [phone] Auth ID # [id number] to discuss completing appeal as pt dtr [name] and husband report the POA form had to be received and processed first before they can complete the appeal.  Reports appeal can take up to 72hrs to process, and updates will be faxed. Appeal completed, and today's clinical notes were faxed.   Cecile Sheerer, MSW, LCSWA Office: [phone] Cell: [phone] Fax: [phone]

## 2021-05-22 NOTE — Progress Notes (Signed)
Speech Language Pathology Weekly Progress Note  Patient Details  Name: Manuel Chapman MRN: [id number] Date of Birth: [birth year]  Beginning of progress report period: May 15, 2021 End of progress report period: May 22, 2021  Short Term Goals: Week 2: SLP Short Term Goal 1 (Week 2): STGs=LTGs due to ELOS SLP Short Term Goal 1 - Progress (Week 2): Not met    New Short Term Goals: Week 3: SLP Short Term Goal 1 (Week 3): STGs=LTGs due to ELOS  Weekly Progress Updates: Patient has made minimal gains this reporting period due to inconsistent participation. Patient continues to demonstrate decreased awareness of deficits and requires encouragement for participation at times. Patient currently requires Min-Mod verbal cues for functional problem solving, recall of functional information and emergent awareness. Patient also requires ongoing encouragement and education for use of Provale cup to reduce s/s of aspiration with thin liquids. Patient remains ~75-90% intelligible depending on arousal but continues to require Min-Mod verbal cues for use of speech intelligibility strategies. Patient's family requested patient discharge to a SNF but awaiting approval. Patient and family education ongoing. Patient would benefit from continued skilled SLP intervention to maximize his cognitive and swallowing function as well as his overall speech intelligibility prior to discharge.      Intensity: Minumum of 1-2 x/day, 30 to 90 minutes Frequency: 3 to 5 out of 7 days Duration/Length of Stay: TBD due to SNF placement Treatment/Interventions: Cognitive remediation/compensation;Internal/external aids;Dysphagia/aspiration precaution training;Cueing hierarchy;Environmental controls;Therapeutic Activities;DME/adaptive equipment instruction;Functional tasks;Patient/family education    Manuel Chapman 05/22/2021, 8:53 AM

## 2021-05-22 NOTE — Progress Notes (Signed)
Physical Therapy Session Note  Patient Details  Name: Manuel Chapman MRN: [id number] Date of Birth: 1945/05/10  Today's Date: 05/22/2021 PT Individual Time: 1004-1059 PT Individual Time Calculation (min): 55 min   Short Term Goals: Week 2:  PT Short Term Goal 1 (Week 2): STGs = LTGs  Skilled Therapeutic Interventions/Progress Updates:     Pt received seated in recliner and agrees to therapy. Reports some soreness in L shoulder. Number not provided. PT provides rest breaks as needed to manage pain. Stand step transfer to [facility] with cues for posture and increasing eccentric control of stand to sit. WC transport to gym for time management. Pt ambulates x100' with RW and cues for upright gaze to improve posture and balance. Remainder of session focused on NMR for L hemibody and balance. Pt's R foot placed on airex to provide unstable somatosensory input. Pt then cued to perform lateral step ups to the L with L lower extremity on 4 inch step, 3x15 with seated rest break. Pt requires minA/modA due to posterior bias and forward flexed posture, which pt maintains despite multiple cues and mirror provided for visual feedback. Pt transported back to room. Stand step to recliner with RW. Left with alarm intact and all needs within reach.  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

## 2021-05-22 NOTE — Progress Notes (Signed)
PROGRESS NOTE   Subjective/Complaints: No new issues. Under covers when I came in.   ROS: Limited due to cognitive/behavioral    Objective:   No results found. No results for input(s): WBC, HGB, HCT, PLT in the last 72 hours.  No results for input(s): NA, K, CL, CO2, GLUCOSE, BUN, CREATININE, CALCIUM in the last 72 hours.    Intake/Output Summary (Last 24 hours) at 05/22/2021 1058 Last data filed at 05/22/2021 0700 Gross per 24 hour  Intake 897 ml  Output 250 ml  Net 647 ml         Physical Exam: Vital Signs Blood pressure (!) 149/58, pulse 61, temperature 98.2 F (36.8 C), temperature source Oral, resp. rate 19, height 5' 7.01" (1.702 m), weight 91 kg, SpO2 99 %.   Constitutional: No distress . Vital signs reviewed. HEENT: NCAT, EOMI, oral membranes moist Neck: supple Cardiovascular: RRR without murmur. No JVD    Respiratory/Chest: CTA Bilaterally without wheezes or rales. Normal effort    GI/Abdomen: BS +, non-tender, non-distended Ext: no clubbing, cyanosis, or edema Psych: pleasant and cooperative  Skin: clean, dry Neurologic: more alert.  Follows basic commands with some delay. Cranial nerves II through XII intact, motor strength is 5/5 in right deltoid, bicep, tricep, grip,  and bilateral hip flexor, knee extensors, ankle dorsiflexor and plantar flexor. Prox left upper limited by pain--stable exam Sensory exam normal for light touch and pain in all 4 limbs. No limb ataxia or cerebellar signs. No abnormal tone appreciated.  Musculoskeletal: left shoulder with mild tenderness with PROM--he can abduct to about 80 degrees without assist--no change   Assessment/Plan: 1. Functional deficits which require 3+ hours per day of interdisciplinary therapy in a comprehensive inpatient rehab setting. Physiatrist is providing close team supervision and 24 hour management of active medical problems listed  below. Physiatrist and rehab team continue to assess barriers to discharge/monitor patient progress toward functional and medical goals  Care Tool:  Bathing    Body parts bathed by patient: Left arm, Chest, Abdomen, Front perineal area, Right upper leg, Face, Right arm, Left lower leg, Right lower leg, Left upper leg   Body parts bathed by helper: Right arm, Buttocks, Right lower leg, Left lower leg     Bathing assist Assist Level: Minimal Assistance - Patient > 75%     Upper Body Dressing/Undressing Upper body dressing   What is the patient wearing?: Pull over shirt    Upper body assist Assist Level: Set up assist    Lower Body Dressing/Undressing Lower body dressing      What is the patient wearing?: Pants     Lower body assist Assist for lower body dressing: Contact Guard/Touching assist Assistive Device Comment: reacher   Toileting Toileting Toileting Activity did not occur (Clothing management and hygiene only): Refused  Toileting assist Assist for toileting: Moderate Assistance - Patient 50 - 74%     Transfers Chair/bed transfer  Transfers assist  Chair/bed transfer activity did not occur: Refused  Chair/bed transfer assist level: Contact Guard/Touching assist     Locomotion Ambulation   Ambulation assist      Assist level: Minimal Assistance - Patient > 75% Assistive device:  Walker-rolling Max distance: 12'   Walk 10 feet activity   Assist     Assist level: Minimal Assistance - Patient > 75% Assistive device: Walker-rolling   Walk 50 feet activity   Assist Walk 50 feet with 2 turns activity did not occur: Safety/medical concerns  Assist level: Minimal Assistance - Patient > 75% Assistive device: Walker-rolling    Walk 150 feet activity   Assist Walk 150 feet activity did not occur: Safety/medical concerns         Walk 10 feet on uneven surface  activity   Assist Walk 10 feet on uneven surfaces activity did not occur:  Safety/medical concerns         Wheelchair     Assist Is the patient using a wheelchair?: Yes Type of Wheelchair: Manual    Wheelchair assist level: Maximal Assistance - Patient 25 - 49% Max wheelchair distance: 20    Wheelchair 50 feet with 2 turns activity    Assist        Assist Level: Maximal Assistance - Patient 25 - 49% (per PT documentation)   Wheelchair 150 feet activity     Assist      Assist Level: Total Assistance - Patient < 25% (per PT documentation)   Blood pressure (!) 149/58, pulse 61, temperature 98.2 F (36.8 C), temperature source Oral, resp. rate 19, height 5' 7.01" (1.702 m), weight 91 kg, SpO2 99 %.  Medical Problem List and Plan: 1. 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 I spoke with insurance company yesterday and they would not relent  -Continue CIR therapies including PT, OT  2.  Antithrombotics: -DVT/anticoagulation:  Pharmaceutical: Heparin             -antiplatelet therapy: Aspirin 81 mg daily and Plavix 75 mg day x3 weeks then Plavix alone 3. Pain Management: Tylenol as needed  -left shoulder appears improved.  -continue aspercreme for left shoulder -can use muscle rub also   -continue k-tape and ROM with therapy 4. Mood and sleep/wake:         1/27 stopped ritalin yesterday and he seems more lethargic and less engaging. Will resume today             -antipsychotic agents: N/A 5. Neuropsych: This patient is capable of making decisions on his own behalf. 6. Skin/Wound Care: Routine skin checks 7. Fluids/Electrolytes/Nutrition:     -BUN elevated---have asked pt to push fluids--recheck in AM 1/27 8.  Diabetes mellitus.   Hemoglobin A1c 9.9.  NovoLog 3 units 3 times daily, Levemir  units  daily.    CBG (last 3)  Recent Labs    05/21/21 2326 05/22/21 0306 05/22/21 0610  GLUCAP 107* 113* 86    1/26  levemir  increased to 9u bid--improving control 9.  Hypertension.  Lisinopril 40 mg daily, Toprol-XL 50 mg daily, hydralazine 50 mg every 8 hours, Hygroton 25 mg daily.   Vitals:   05/22/21 0308 05/22/21 0835  BP: (!) 125/50 (!) 149/58  Pulse: (!) 54 61  Resp: 19   Temp: 98.2 F (36.8 C)   SpO2: 99%  toprol  100mg  qd,   hydralazine qid  -1/27   improved with clonidine 0.1mg  bid--continue to follow 10.  Hyperlipidemia.  continue Lipitor 11.  History of gout.  continue Allopurinol.  Monitor for any gout flareups 12. Bradycardia: 50s-60s: continue to monitor HR TID.     LOS: 13 days A FACE TO FACE EVALUATION WAS PERFORMED  Manuel Chapman 05/22/2021, 10:58 AM

## 2021-05-23 LAB — GLUCOSE, CAPILLARY
Glucose-Capillary: 60 mg/dL — ABNORMAL LOW (ref 70–99)
Glucose-Capillary: 94 mg/dL (ref 70–99)
Glucose-Capillary: 152 mg/dL — ABNORMAL HIGH (ref 70–99)
Glucose-Capillary: 219 mg/dL — ABNORMAL HIGH (ref 70–99)
Glucose-Capillary: 240 mg/dL — ABNORMAL HIGH (ref 70–99)

## 2021-05-23 NOTE — Progress Notes (Signed)
PROGRESS NOTE   Subjective/Complaints: Hypoglycemic this morning. Asymptomatic. No new complaints  ROS: Patient denies fever, rash, sore throat, blurred vision, dizziness, nausea, vomiting, diarrhea, cough, shortness of breath or chest pain, back/neck pain, headache, or mood change.    Objective:   No results found. No results for input(s): WBC, HGB, HCT, PLT in the last 72 hours.  No results for input(s): NA, K, CL, CO2, GLUCOSE, BUN, CREATININE, CALCIUM in the last 72 hours.    Intake/Output Summary (Last 24 hours) at 05/23/2021 1348 Last data filed at 05/23/2021 1341 Gross per 24 hour  Intake 950 ml  Output 400 ml  Net 550 ml         Physical Exam: Vital Signs Blood pressure (!) 123/49, pulse (!) 57, temperature 98 F (36.7 C), temperature source Oral, resp. rate 17, height 5' 7.01" (1.702 m), weight 91 kg, SpO2 100 %.   Constitutional: No distress . Vital signs reviewed. HEENT: NCAT, EOMI, oral membranes moist Neck: supple Cardiovascular: RRR without murmur. No JVD    Respiratory/Chest: CTA Bilaterally without wheezes or rales. Normal effort    GI/Abdomen: BS +, non-tender, non-distended Ext: no clubbing, cyanosis, or edema Psych: pleasant and cooperative  Skin: clean, dry Neurologic: more alert.  Follows basic commands with some delay. Cranial nerves II through XII intact, motor strength is 5/5 in right deltoid, bicep, tricep, grip,  and bilateral hip flexor, knee extensors, ankle dorsiflexor and plantar flexor. Prox left upper limited by pain--stable exam Sensory exam normal for light touch and pain in all 4 limbs. No limb ataxia or cerebellar signs. No abnormal tone appreciated.  Musculoskeletal: left shoulder with mild tenderness with PROM--he can abduct to about 80 degrees without assist--no change   Assessment/Plan: 1. Functional deficits which require 3+ hours per day of interdisciplinary therapy in a  comprehensive inpatient rehab setting. Physiatrist is providing close team supervision and 24 hour management of active medical problems listed below. Physiatrist and rehab team continue to assess barriers to discharge/monitor patient progress toward functional and medical goals  Care Tool:  Bathing    Body parts bathed by patient: Left arm, Chest, Abdomen, Front perineal area, Right upper leg, Face, Right arm, Left lower leg, Right lower leg, Left upper leg   Body parts bathed by helper: Right arm, Buttocks, Right lower leg, Left lower leg     Bathing assist Assist Level: Minimal Assistance - Patient > 75%     Upper Body Dressing/Undressing Upper body dressing   What is the patient wearing?: Pull over shirt    Upper body assist Assist Level: Set up assist    Lower Body Dressing/Undressing Lower body dressing      What is the patient wearing?: Pants     Lower body assist Assist for lower body dressing: Contact Guard/Touching assist Assistive Device Comment: reacher   Toileting Toileting Toileting Activity did not occur (Clothing management and hygiene only): Refused  Toileting assist Assist for toileting: Moderate Assistance - Patient 50 - 74%     Transfers Chair/bed transfer  Transfers assist  Chair/bed transfer activity did not occur: Refused  Chair/bed transfer assist level: Contact Guard/Touching assist     Locomotion Ambulation  Ambulation assist      Assist level: Minimal Assistance - Patient > 75% Assistive device: Walker-rolling Max distance: 12'   Walk 10 feet activity   Assist     Assist level: Minimal Assistance - Patient > 75% Assistive device: Walker-rolling   Walk 50 feet activity   Assist Walk 50 feet with 2 turns activity did not occur: Safety/medical concerns  Assist level: Minimal Assistance - Patient > 75% Assistive device: Walker-rolling    Walk 150 feet activity   Assist Walk 150 feet activity did not occur:  Safety/medical concerns         Walk 10 feet on uneven surface  activity   Assist Walk 10 feet on uneven surfaces activity did not occur: Safety/medical concerns         Wheelchair     Assist Is the patient using a wheelchair?: Yes Type of Wheelchair: Manual    Wheelchair assist level: Maximal Assistance - Patient 25 - 49% Max wheelchair distance: 20    Wheelchair 50 feet with 2 turns activity    Assist        Assist Level: Maximal Assistance - Patient 25 - 49% (per PT documentation)   Wheelchair 150 feet activity     Assist      Assist Level: Total Assistance - Patient < 25% (per PT documentation)   Blood pressure (!) 123/49, pulse (!) 57, temperature 98 F (36.7 C), temperature source Oral, resp. rate 17, height 5' 7.01" (1.702 m), weight 91 kg, SpO2 100 %.  Medical Problem List and Plan: 1. 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 I spoke with insurance company yesterday and they would not relent  -Continue CIR therapies including PT, OT  2.  Antithrombotics: -DVT/anticoagulation:  Pharmaceutical: Heparin             -antiplatelet therapy: Aspirin 81 mg daily and Plavix 75 mg day x3 weeks then Plavix alone 3. Pain Management: Tylenol as needed  -left shoulder appears improved.  -continue aspercreme for left shoulder -can use muscle rub also   -continue k-tape and ROM with therapy 4. Mood and sleep/wake:         1/28 resumed full dose ritalin to help with motivation/arousal             -antipsychotic agents: N/A 5. Neuropsych: This patient is capable of making decisions on his own behalf. 6. Skin/Wound Care: Routine skin checks 7.  Fluids/Electrolytes/Nutrition:     -BUN elevated---have asked pt to push fluids--recheck in AM 1/27 8.  Diabetes mellitus.  Hemoglobin A1c 9.9.  NovoLog 3 units 3 times daily, Levemir  units  daily.    CBG (last 3)  Recent Labs    05/23/21 0612 05/23/21 0639 05/23/21 1209  GLUCAP 60* 94 152*    1/28  levemir  increased to 9u bid--may need to decrease pm dose if he has another episode of am hypoglycemia 9.  Hypertension.  Lisinopril 40 mg daily, Toprol-XL 50 mg daily, hydralazine 50 mg every 8 hours, Hygroton 25 mg daily.   Vitals:   05/23/21 0423 05/23/21 1331  BP: (!) 144/63 (!) 123/49  Pulse: 64 (!) 57  Resp: 18 17  Temp: 98.1 F (36.7 C) 98 F (36.7 C)  SpO2: 100% 100%       toprol  100mg  qd,   hydralazine qid  -1/28   improved with clonidine 0.1mg  bid--continue to follow 10.  Hyperlipidemia.  continue Lipitor 11.  History of gout.  continue Allopurinol.  Monitor for any gout flareups 12. Bradycardia: 50s-60s: continue to monitor HR TID.     LOS: 14 days A FACE TO FACE EVALUATION WAS PERFORMED  Meredith Staggers 05/23/2021, 1:48 PM

## 2021-05-23 NOTE — Progress Notes (Signed)
Patient resting in bed. Denies pain or discomfort throughout the day. Appetite good 100 % all meals. Snacks provided between meals. Patient repositioned Q 2 hours. Educated patient to reposition while resting in bed, and the importance of a small snack before bed in the evening. Call light and personal items within reach, safety maintained.

## 2021-05-23 NOTE — Progress Notes (Signed)
Hypoglycemic Event  CBG: 60  Treatment: 4 oz juice/soda  Symptoms: None  Follow-up CBG: Time:0636 CBG Result:94  Possible Reasons for Event: Unknown  Comments/MD notified:Dr Riley Kill at bedside no no orders given     [facility]

## 2021-05-24 LAB — GLUCOSE, CAPILLARY
Glucose-Capillary: 223 mg/dL — ABNORMAL HIGH (ref 70–99)
Glucose-Capillary: 113 mg/dL — ABNORMAL HIGH (ref 70–99)
Glucose-Capillary: 161 mg/dL — ABNORMAL HIGH (ref 70–99)
Glucose-Capillary: 175 mg/dL — ABNORMAL HIGH (ref 70–99)

## 2021-05-24 NOTE — Progress Notes (Signed)
Physical Therapy Session Note  Patient Details  Name: Manuel Chapman MRN: [id number] Date of Birth: Jan 23, 1946  Today's Date: 05/24/2021 PT Individual Time: 1115-1130 PT Individual Time Calculation (min): 15 min   Short Term Goals: Week 2:  PT Short Term Goal 1 (Week 2): STGs = LTGs  Skilled Therapeutic Interventions/Progress Updates:     Patient in bed upon PT arrival. Patient alert and agreeable to PT session. Patient denied pain during session. Patient reported feeling fatigued today and declined any mobility outside of getting up to the recliner. Provided education on benefits of increased mobility and offered ADLs, toileting, household gait training, and therapeutic exercise. Patient continued to decline any other skilled intervention than working on bed mobility and bed>recliner transfer at this time. Patient performed supine to sit with supervision in a near flat bed without use of bed rail with increased time effort. Cued patient to bring shoulders forward to push up from his elbow to reduce strain with mobility. Patient sat EOB for several minutes due to sudden onset of dizziness in sitting, resolved <2 min, he sat EOB with supervision throughout. Required increased time to initiate standing with gentle encouragement from PT. Eventually he performed stand pivot bed>recliner with CGA using RW. Attempted to engage patient in education or seated exercises. Patient stated, "you can put my legs up and go." PT elevated patient's legs and terminated the session due to patient fatigue and unwillingness to participate at this time. Patient missed 45 min of skilled PT due to fatigue/unwillingness to participate, RN made aware. Will attempt to make-up missed time as able.    Patient in recliner in the room at end of session with breaks locked, chair alarm set, and all needs within reach.   Therapy Documentation Precautions:  Precautions Precautions: Fall Precaution Comments: Reports  previous CVA with residual L weakness Restrictions Weight Bearing Restrictions: No General: PT Amount of Missed Time (min): 45 Minutes PT Missed Treatment Reason: Patient fatigue;Patient unwilling to participate    Therapy/Group: Individual Therapy  Ido Wollman L Nazia Rhines PT, DPT  05/24/2021, 12:29 PM

## 2021-05-24 NOTE — Progress Notes (Signed)
Patient up in high back chair watching football since noon. Denies pain or discomfort at this time. Assisted patient with repositioning every 2 hours and PRN. Incontinent at times. Appetite good, encouraged fluids. Call light within reach. Safety maintained.

## 2021-05-24 NOTE — Progress Notes (Signed)
Occupational Therapy Session Note  Patient Details  Name: Manuel Chapman MRN: [id number] Date of Birth: [birth year]  Today's Date: 05/24/2021 OT Individual Time:  - 90 minutes missed     Skilled Therapeutic Interventions/Progress Updates:    Pt greeted in bed, asleep, easily woken. Pt stating that he wanted to keep sleeping, adamant that he did not want to participate in any form of therapeutic activity. OT noticed that his fitted sheet had multiple spots of blood on it. Asked pt if he wanted to transfer out of bed so that we could put clean linen on the bed however pt still refused. Notified RN of pts soiled bed linen and his refusal to participate. 60 minutes missed. Tried again to see pt at end of the day for scheduled OT with pt still refusing, wanting to sleep and adamant about this. Reported that he had just used the restroom with staff. 30 minutes missed.   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 Restrictions Weight Bearing Restrictions: No Vital Signs: Therapy Vitals Temp: 97.6 F (36.4 C) Temp Source: Oral Pulse Rate: 61 Resp: 19 BP: 134/61 Patient Position (if appropriate): Sitting Oxygen Therapy SpO2: 100 % O2 Device: Room Air Pain: pt denied pain   ADL: ADL Eating: Not assessed Grooming: Not assessed Upper Body Bathing: Minimal assistance Where Assessed-Upper Body Bathing: Edge of bed Lower Body Bathing: Moderate assistance Where Assessed-Lower Body Bathing: Edge of bed Upper Body Dressing: Minimal assistance Where Assessed-Upper Body Dressing: Edge of bed Lower Body Dressing: Maximal assistance Where Assessed-Lower Body Dressing: Edge of bed Toileting: Not assessed Toilet Transfer Method: Not assessed Tub/Shower Transfer: Not assessed   Therapy/Group: Individual Therapy  Milderd Manocchio A Stuart Mirabile 05/24/2021, 6:50 PM

## 2021-05-24 NOTE — Progress Notes (Signed)
Speech Language Pathology Daily Session Note  Patient Details  Name: Manuel Chapman MRN: [id number] Date of Birth: June 27, 1945  Today's Date: 05/24/2021 SLP Individual Time: 1300-1345 SLP Individual Time Calculation (min): 45 min  Short Term Goals: Week 2: SLP Short Term Goal 1 (Week 2): STGs=LTGs due to ELOS SLP Short Term Goal 1 - Progress (Week 2): Not met  Skilled Therapeutic Interventions: Skilled SLP intervention focused on cognition and dysphagia. Pt seen with thin liquids via provale cup. He tolerated 5/5 sips with no overt s/sx of aspiration or penetration. Pt educated on use of overarticulation to increase precision with lingual sounds. He increase accuracy with sentence level tasks and mod visual models Short term memory recall task requiring pt to name items previously named in categories after Manuel delay completed with mod Manuel verbal descriptions to increase recall. Cont with therapy per plan of care.      Pain Pain Assessment Pain Scale: Faces Faces Pain Scale: No hurt  Therapy/Group: Individual Therapy  Manuel Chapman Manuel Chapman 05/24/2021, 1:40 PM

## 2021-05-25 DIAGNOSIS — I6300 Cerebral infarction due to thrombosis of unspecified precerebral artery: Secondary | ICD-10-CM

## 2021-05-25 LAB — GLUCOSE, CAPILLARY
Glucose-Capillary: 160 mg/dL — ABNORMAL HIGH (ref 70–99)
Glucose-Capillary: 119 mg/dL — ABNORMAL HIGH (ref 70–99)
Glucose-Capillary: 167 mg/dL — ABNORMAL HIGH (ref 70–99)
Glucose-Capillary: 160 mg/dL — ABNORMAL HIGH (ref 70–99)

## 2021-05-25 NOTE — Progress Notes (Signed)
Physical Therapy Note  Patient Details  Name: Manuel Chapman MRN: [id number] Date of Birth: 1945-07-04 Today's Date: 05/25/2021    1115: Patient in bed upon PT arrival. Patient declined OOB mobility or any other skilled interventions at this time. Offerred bed level exercises, toileting, transferring to the recliner, and patient education. Patient declined without providing a reason and politely asked PT leave at this time stated he did not want to talk. Patient in bed with bed alarm set and all needs in reach upon PT departure. Patient missed 60 min of skilled PT due to refusal, RN made aware. Will attempt to make-up missed time as able.     Kden Wagster L Luca Dyar PT, DPT  05/25/2021, 11:20 AM

## 2021-05-25 NOTE — Progress Notes (Signed)
Patient declines therapy today, states I just don't want to do it. Denies pain or discomfort. Appetite good. Encouraged fluids.

## 2021-05-25 NOTE — Progress Notes (Signed)
PROGRESS NOTE   Subjective/Complaints: Has chronic shoulder pain No issues overnite per pt or RN (in room) ,  ROS: Patient denies CP, SOB, N/V/D   Objective:   No results found. No results for input(s): WBC, HGB, HCT, PLT in the last 72 hours.  No results for input(s): NA, K, CL, CO2, GLUCOSE, BUN, CREATININE, CALCIUM in the last 72 hours.    Intake/Output Summary (Last 24 hours) at 05/25/2021 0853 Last data filed at 05/25/2021 0755 Gross per 24 hour  Intake 658 ml  Output 400 ml  Net 258 ml          Physical Exam: Vital Signs Blood pressure (!) 152/53, pulse (!) 58, temperature 98.3 F (36.8 C), temperature source Oral, resp. rate 14, height 5' 7.01" (1.702 m), weight 100.7 kg, SpO2 100 %.   General: No acute distress Mood and affect are appropriate Heart: Regular rate and rhythm no rubs, 3/6 holosystolic  murmur Lungs: Clear to auscultation, breathing unlabored, no rales or wheezes Abdomen: Positive bowel sounds, soft nontender to palpation, nondistended Extremities: No clubbing, cyanosis,Trace pre tib  Skin: No evidence of breakdown, no evidence of rash  Neurologic: more alert.  Follows basic commands with some delay. Cranial nerves II through XII intact, motor strength is 5/5 in right deltoid, bicep, tricep, grip,  and bilateral hip flexor, knee extensors, ankle dorsiflexor and plantar flexor. Prox left upper limited by pain--stable exam Sensory exam normal for light touch and pain in all 4 limbs. No limb ataxia or cerebellar signs. No abnormal tone appreciated.  Musculoskeletal: left shoulder with mild tenderness with PROM--he can abduct to about 80 degrees without assist--no change   Assessment/Plan: 1. Functional deficits which require 3+ hours per day of interdisciplinary therapy in a comprehensive inpatient rehab setting. Physiatrist is providing close team supervision and 24 hour management of active  medical problems listed below. Physiatrist and rehab team continue to assess barriers to discharge/monitor patient progress toward functional and medical goals  Care Tool:  Bathing    Body parts bathed by patient: Left arm, Chest, Abdomen, Front perineal area, Right upper leg, Face, Right arm, Left lower leg, Right lower leg, Left upper leg   Body parts bathed by helper: Right arm, Buttocks, Right lower leg, Left lower leg     Bathing assist Assist Level: Minimal Assistance - Patient > 75%     Upper Body Dressing/Undressing Upper body dressing   What is the patient wearing?: Pull over shirt    Upper body assist Assist Level: Set up assist    Lower Body Dressing/Undressing Lower body dressing      What is the patient wearing?: Pants     Lower body assist Assist for lower body dressing: Contact Guard/Touching assist Assistive Device Comment: reacher   Toileting Toileting Toileting Activity did not occur (Clothing management and hygiene only): Refused  Toileting assist Assist for toileting: Moderate Assistance - Patient 50 - 74%     Transfers Chair/bed transfer  Transfers assist  Chair/bed transfer activity did not occur: Refused  Chair/bed transfer assist level: Contact Guard/Touching assist     Locomotion Ambulation   Ambulation assist      Assist level: Minimal  Assistance - Patient > 75% Assistive device: Walker-rolling Max distance: 12'   Walk 10 feet activity   Assist     Assist level: Minimal Assistance - Patient > 75% Assistive device: Walker-rolling   Walk 50 feet activity   Assist Walk 50 feet with 2 turns activity did not occur: Safety/medical concerns  Assist level: Minimal Assistance - Patient > 75% Assistive device: Walker-rolling    Walk 150 feet activity   Assist Walk 150 feet activity did not occur: Safety/medical concerns         Walk 10 feet on uneven surface  activity   Assist Walk 10 feet on uneven surfaces  activity did not occur: Safety/medical concerns         Wheelchair     Assist Is the patient using a wheelchair?: Yes Type of Wheelchair: Manual    Wheelchair assist level: Maximal Assistance - Patient 25 - 49% Max wheelchair distance: 20    Wheelchair 50 feet with 2 turns activity    Assist        Assist Level: Maximal Assistance - Patient 25 - 49% (per PT documentation)   Wheelchair 150 feet activity     Assist      Assist Level: Total Assistance - Patient < 25% (per PT documentation)   Blood pressure (!) 152/53, pulse (!) 58, temperature 98.3 F (36.8 C), temperature source Oral, resp. rate 14, height 5' 7.01" (1.702 m), weight 100.7 kg, SpO2 100 %.  Medical Problem List and Plan: 1. 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  Antithrombotics: -DVT/anticoagulation:  Pharmaceutical: Heparin             -antiplatelet therapy: Aspirin 81 mg daily and Plavix 75 mg day x3 weeks then Plavix alone- will d/c ASA today  3. Pain Management: Tylenol as needed  -left shoulder appears improved.  -continue aspercreme for left shoulder -can use muscle rub also   -continue k-tape and ROM with therapy 4. Mood and sleep/wake:         1/28 ritalin 5mg  BID  to help with motivation/arousal- monitor effect prior to increasing dose if needed              -antipsychotic agents: N/A 5. Neuropsych: This patient is capable of making decisions on his own behalf. 6. Skin/Wound Care: Routine skin checks 7. Fluids/Electrolytes/Nutrition:     -BUN elevated---have asked pt to push fluids--recheck in  AM 1/27 8.  Diabetes mellitus.  Hemoglobin A1c 9.9.  NovoLog 3 units 3 times daily, Levemir  units  daily.    CBG (last 3)  Recent Labs    05/24/21 1707 05/24/21 2209 05/25/21 0622  GLUCAP 161* 175* 160*     1/30 good control  9.  Hypertension.  Lisinopril 40 mg daily, Toprol-XL 50 mg daily, hydralazine 50 mg every 8 hours, Hygroton 25 mg daily.   Vitals:   05/24/21 1934 05/25/21 0344  BP: (!) 142/53 (!) 152/53  Pulse: (!) 56 (!) 58  Resp: 14 14  Temp: 98 F (36.7 C)  98.3 F (36.8 C)  SpO2: 99% 100%       toprol  100mg  qd,   hydralazine qid  -1/28   improved with clonidine 0.1mg  bid--continue to follow 10.  Hyperlipidemia.  continue Lipitor 11.  History of gout.  continue Allopurinol.  Monitor for any gout flareups 12. Bradycardia: 50s-60s: continue to monitor HR TID.   13.  Aortic stenosis- OP f/u with cardiology   LOS: 16 days A FACE TO FACE EVALUATION WAS PERFORMED  01-26-1973 05/25/2021, 8:53 AM

## 2021-05-25 NOTE — Progress Notes (Signed)
Patient ID: Manuel Chapman, male   DOB: 11/20/1945, 76 y.o.   MRN: [id number]  SW faxed appointment of representative form to [facility] [phone] to help with processing appeal. Waiting on updates.   SW spoke with pt dtr [name] [phone]) to give above updates and will update once there is more information. She continues to report patient does not have anywhere for him to stay if insurance declines.   SW spoke with [place] [facility] [phone] option #3/f: [phone] Auth ID # [id number] to discuss if form has been received. Confirms form has been received, and will inform appeals team for them to update and to help with making a decision.   Cecile Sheerer, MSW, LCSWA Office: [phone] Cell: [phone] Fax: [phone]

## 2021-05-25 NOTE — Progress Notes (Signed)
Speech Language Pathology Daily Session Note  Patient Details  Name: Manuel Chapman MRN: [id number] Date of Birth: Oct 08, 1945  Today's Date: 05/25/2021 SLP Individual Time: 0725-0805 SLP Individual Time Calculation (min): 40 min  Short Term Goals: Week 3: SLP Short Term Goal 1 (Week 3): STGs=LTGs due to ELOS  Skilled Therapeutic Interventions: Skilled treatment session focused on dysphagia and cognitive goals. SLP facilitated session by providing trials of thin liquids via cup. Despite Max A multimodal cues, patient utilized large, sequential sips resulting in a delayed cough in 2/3 trials (consumed 8oz in 3 sips). SLP provided education regarding importance of small sips and patient reported, "I am going to do what I want to do." SLP also facilitated a functional conversation that focused on emergent awareness and overall safety awareness. Patient continues to demonstrate appropriate intellectual awareness of physical deficits but requires Mod-Max A for emergent awareness regarding importance of participating in treatment sessions consistently. Patient verbalized understanding but continues to report, "I am going to do it my way." Patient also continues to endorse that his roommate took care of the IADLs for him at baseline. Patient left upright in bed with alarm on and all needs within reach. Continue with current plan of care.      Pain No/Denies Pain   Therapy/Group: Individual Therapy  Pardeep Pautz 05/25/2021, 8:31 AM

## 2021-05-26 DIAGNOSIS — M7522 Bicipital tendinitis, left shoulder: Secondary | ICD-10-CM

## 2021-05-26 DIAGNOSIS — M7582 Other shoulder lesions, left shoulder: Secondary | ICD-10-CM

## 2021-05-26 LAB — GLUCOSE, CAPILLARY
Glucose-Capillary: 139 mg/dL — ABNORMAL HIGH (ref 70–99)
Glucose-Capillary: 88 mg/dL (ref 70–99)
Glucose-Capillary: 155 mg/dL — ABNORMAL HIGH (ref 70–99)
Glucose-Capillary: 209 mg/dL — ABNORMAL HIGH (ref 70–99)

## 2021-05-26 MED ORDER — TRIAMCINOLONE ACETONIDE 40 MG/ML IJ SUSP
40.0000 mg | Freq: Once | INTRAMUSCULAR | Status: AC
  Administered 2021-05-26: 40 mg via INTRA_ARTICULAR
  Filled 2021-05-26: qty 1

## 2021-05-26 MED ORDER — LIDOCAINE HCL (PF) 1 % IJ SOLN
5.0000 mL | Freq: Once | INTRAMUSCULAR | Status: AC
  Administered 2021-05-26: 5 mL
  Filled 2021-05-26 (×2): qty 5

## 2021-05-26 NOTE — Progress Notes (Signed)
Physical Therapy Session Note  Patient Details  Name: Manuel Chapman MRN: [id number] Date of Birth: 20-Apr-1946  Today's Date: 05/26/2021 PT Missed Time: 60 Minutes Missed Time Reason: Patient unwilling to participate  Short Term Goals: Week 2:  PT Short Term Goal 1 (Week 2): STGs = LTGs  Skilled Therapeutic Interventions/Progress Updates:     Pt received seated in recliner asleep. Easily roused but politely defers therapy at this time. PT will follow up as able.  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

## 2021-05-26 NOTE — Progress Notes (Signed)
Occupational Therapy Session Note  Patient Details  Name: Manuel Chapman MRN: [id number] Date of Birth: Sep 15, 1945  Today's Date: 05/26/2021 OT Individual Time: 7782-4235 OT Individual Time Calculation (min): 23 min    Short Term Goals: Week 2:  OT Short Term Goal 1 (Week 2): Patient will complete 1 step of toileting task OT Short Term Goal 2 (Week 2): Patient will tolerate standing at the sink for 2 minutes in preparation for BADL Task OT Short Term Goal 3 (Week 2): Patient will demonstrate use of adaptive equipment for LB dressing with min questioning cues  Skilled Therapeutic Interventions/Progress Updates:  Skilled OT intervention completed with focus on activity participation, BUE strengthening, bilateral integration. Pt received supine in bed, with pt initially declining OT intervention however agreeable to EOB exercises. Pt completed bed mobility with HOB at 90 degrees, with L hand held assist and min A needed to get pt to scoot forward for both feet to be supported on floor. Pt completed the following exercises with 3 pound dowel to promote endurance and bilateral integration of BUEs needed for functional tasks:  Bicep flexion 3x15 Belly button level chest presses 2x15 Tricep extension (eccentric concentration) x10 AAROM of L shoulder abduction with dowel in R hand propping L arm  Pt needed mod-max encouragement to complete all exercises with cues needed also for form and technique. Modifications made throughout due to pt stating that his L shoulder "is popping but doesn't hurt." Pt completed bed mobility with min A for repositioning. Pt left upright in bed, with bed alarm on and all needs in reach at end of session.   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

## 2021-05-26 NOTE — Progress Notes (Signed)
PROCEDURE NOTE  DIAGNOSIS: Left RTC syndrome  INTERVENTION:  Major jt injection     After informed consent and preparation of the skin with betadine and isopropyl alcohol, I injected 40mg  kenalog and 4cc of 1% lidocaine into the left shoulder via lateral approach. Additionally, aspiration was performed prior to injection. The patient tolerated well, and no complications were encountered. Afterward the area was cleaned and dressed. Post- injection instructions were provided.      , MD, [facility] Physical Medicine & Rehabilitation 05/26/2021

## 2021-05-26 NOTE — Progress Notes (Signed)
Patient ID: Manuel Chapman, male   DOB: [birth year], 76 y.o.   MRN: [id number]  SW received message from [employer] [phone] indicating appeal received and processing with updates by 05/29/21. SW faxed updated clinicals to [phone].  SW returned phone call to [facility] [phone]) who reported she is working on appeal and information will be provided to MD for review.  Loralee Pacas, MSW, [place] Office: [phone] Cell: [phone] Fax: [phone]

## 2021-05-26 NOTE — Plan of Care (Signed)
Patient's plan of care adjusted to QD after speaking with care team and discussed with MD in team conference as patient currently unable to tolerate current therapy schedule with OT, PT, and SLP.

## 2021-05-26 NOTE — Progress Notes (Signed)
Occupational Therapy Weekly Progress Note  Patient Details  Name: Manuel Chapman MRN: [id number] Date of Birth: [birth year]  Beginning of progress report period: May 10, 2021 End of progress report period: May 26, 2021  Today's Date: 05/26/2021 Session 1 OT Individual Time: 8546-2703 OT Individual Time Calculation (min): 27 min   Session 2 OT Individual Time: 1345-1445 OT Individual Time Calculation (min): 60 min    Patient has met 3 of 3 short term goals.  Patient is making slow but steady progress towards goals. Pt was able to use ADL AE to complete LB dressing this week with increased independence. He continues to struggle with incontinence and is unaware when he has had an accident. Pt has also has had some refusals to participate in therapy at times. Plan is for patient to discharge to SNF for continued rehabilitation.   Patient continues to demonstrate the following deficits: muscle weakness, decreased cardiorespiratoy endurance, decreased awareness and decreased memory, and decreased standing balance, decreased postural control, and decreased balance strategies and therefore will continue to benefit from skilled OT intervention to enhance overall performance with BADL and Reduce care partner burden.  Patient progressing toward long term goals..  Continue plan of care.  OT Short Term Goals Week 2:  OT Short Term Goal 1 (Week 2): Patient will complete 1 step of toileting task OT Short Term Goal 1 - Progress (Week 2): Met OT Short Term Goal 2 (Week 2): Patient will tolerate standing at the sink for 2 minutes in preparation for BADL Task OT Short Term Goal 2 - Progress (Week 2): Met OT Short Term Goal 3 (Week 2): Patient will demonstrate use of adaptive equipment for LB dressing with min questioning cues OT Short Term Goal 3 - Progress (Week 2): Met Week 3:  OT Short Term Goal 1 (Week 3): LTG=STG 2/2 ELOS  Skilled Therapeutic Interventions/Progress Updates:  Session  1   Pt greeted semi-reclined in bed, pt reported just finishing up with a previous therapy. Pt still had gown on and agreeable to don clean clothes. Pt completed bed mobility with supervision and increased time to initiate. At EOB, but washed underarms and donned deodorant. UB dressing with set-up A. Pt had not been incontinent! Pt needed min A to thread pant legs without use of reacher. CGA sit<>stand with RW to pull up pants, then pt pivoted to recliner with RW and close supervision. Pt left seated in recliner with legs elevated, alarm on, call bell in reach, and needs met.  Pain: L shoulder 5/10, rest and repositioned.  Session 2 Pt greeted seated in recliner and agreeable to OT treatment session. Pt declined need to go to the bathroom, but noted urinary incontinence when standing. Pt able to stand and wash front peri-area with OT assist to wash buttocks. OT donned clean brief, then pt able to pull pants back up. Pt ambulated to the wc in room w/ RW and close supervision. Pt brought to therapy gym and donned clean shirt as shirt had been ripped when MD placed joint injection. Pt then performed stand-pivot to NuStep with CGA. Pt performed 20 minutes on NuStep per pt request with workload 4, progressing to workload 5 for the last 10 minutes. Pt reported improved pain and ROM of L shoulder while pushing and pulling on NuStepPt returned to room and ambulated 15 feet w/ RW and supervision back to recliner. Pt left seated in recliner with chair alarm on, call bell in reach, and needs met.   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

## 2021-05-26 NOTE — Patient Care Conference (Signed)
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 Alger Simons Social Worker Present: Loralee Pacas, [place] Nurse Present: Dorthula Nettles, RN PT Present: Tereasa Coop, PT OT Present: Cherylynn Ridges, OT SLP Present: Weston Anna, SLP PPS Coordinator present : Gunnar Fusi, SLP     Current Status/Progress Goal Weekly Team Focus  Bowel/Bladder   Incontinent of bladder. Mixed continence with bowel. Last BM: 1/30  Patient will regain continence of both bowel and bladder  Q 2 hour toiletting and PRN.   Swallow/Nutrition/ Hydration   Regular textures with thin liquids, min A for use of swallow strategies  Mod I  Use of swallowing compensatory strategies   ADL's   set-up/CGA supervision UB bathing/dressing, Min A LB ADLs, transfers  CGA  dc planning, pt/family education, general strengthening, participation, self-care retraining   Mobility   supervision bed mobility, stand step transfers with RW, and CGA gait ~500' with RW  Supervision  L hemibody NMR, balance, L attention, ambulation   Communication   Mod I  Mod i  at baseline   Safety/Cognition/ Behavioral Observations  Min-Mod A  Min A  safety, intellectual awareness, problem solving, reasoing   Pain   Patient reports mild pain to L shoulder - scheduled muscle rub in place with positive  effect  0/10  Assess Q shift and PRN   Skin   Skin intact. Bruising noted to abdomen r/t heparin shots  Patient will remain free from skin breakdown  Assess skin Q shift and PRN     Discharge Planning:  Pt will d/c to SNF- [facility] pending insurance authorization. Recent appeal submitted due to insurance and pending decision.   Team Discussion: Will do shoulder injection today. Incontinent B/B, continent bladder at times, and can use urinal. BLE edema. Waiting on insurance approval for SNF placement. Long-term Medicaid pending.   Patient on target to meet rehab goals: yes, contact guard to supervision goals. At baseline can't perform peri care. Deficiency in self-care and awareness. Ambulating over 500 ft with RW. Very slow and doesn't follow safety cues. Consistent with overt signs of aspiration. Recommending Q-day therapy.  *See Care Plan and progress notes for long and short-term goals.   Revisions to Treatment Plan:  Q-day therapy   Teaching Needs: Family education, medication management, bowel/bladder management, safety awareness, transfer/gait training, etc.   Current Barriers to Discharge: Decreased caregiver support, Incontinence, Weight, Medication compliance, and Behavior  Possible Resolutions to Barriers: Family education Timed toileting schedule Order recommended DME     Medical Summary Current Status: left shoulder pain ongoing. bradycardia. bp's and cbg's better  Barriers to Discharge: Medical stability   Possible Resolutions to [employer] Focus: daily assessment of labs and pt data   Continued Need for Acute Rehabilitation Level of Care: The patient requires daily medical management by a physician with specialized training in  physical medicine and rehabilitation for the following reasons: Direction of a multidisciplinary physical rehabilitation program to maximize functional independence : Yes Medical management of patient stability for increased  activity during participation in an intensive rehabilitation regime.: Yes Analysis of laboratory values and/or radiology reports with any subsequent need for medication adjustment and/or medical intervention. : Yes   I attest that I was present, lead the team conference, and concur with the assessment and plan of the team.   Cristi Loron 05/26/2021, 4:04 PM

## 2021-05-26 NOTE — Progress Notes (Signed)
PROGRESS NOTE   Subjective/Complaints: Continued left shoulder pain. No new issues. Pt still declining therapy. Bradycardia reported by nursing today  ROS: Patient denies fever, rash, sore throat, blurred vision, dizziness, nausea, vomiting, diarrhea, cough, shortness of breath or chest pain,  back/neck pain, headache, or mood change.    Objective:   No results found. No results for input(s): WBC, HGB, HCT, PLT in the last 72 hours.  No results for input(s): NA, K, CL, CO2, GLUCOSE, BUN, CREATININE, CALCIUM in the last 72 hours.    Intake/Output Summary (Last 24 hours) at 05/26/2021 0858 Last data filed at 05/26/2021 0455 Gross per 24 hour  Intake 177 ml  Output 500 ml  Net -323 ml         Physical Exam: Vital Signs Blood pressure (!) 136/58, pulse (!) 53, temperature 98.4 F (36.9 C), resp. rate 20, height 5' 7.01" (1.702 m), weight 100.7 kg, SpO2 100 %.   Constitutional: No distress . Vital signs reviewed. HEENT: NCAT, EOMI, oral membranes moist Neck: supple Cardiovascular: RRR without murmur. No JVD    Respiratory/Chest: CTA Bilaterally without wheezes or rales. Normal effort    GI/Abdomen: BS +, non-tender, non-distended Ext: no clubbing, cyanosis, or edema Psych: pleasant and cooperative  Skin: No evidence of breakdown, no evidence of rash  Neurologic: more alert.  Follows basic commands with some delay. Cranial nerves II through XII intact, motor strength is 5/5 in right deltoid, bicep, tricep, grip,  and bilateral hip flexor, knee extensors, ankle dorsiflexor and plantar flexor. Prox left upper limited by pain--stable exam Sensory exam normal for light touch and pain in all 4 limbs. No limb ataxia or cerebellar signs. No abnormal tone appreciated.   Musculoskeletal: left shoulder still tender. Most pain is anterior along biceps tendons   Assessment/Plan: 1. Functional deficits which require 3+ hours per  day of interdisciplinary therapy in a comprehensive inpatient rehab setting. Physiatrist is providing close team supervision and 24 hour management of active medical problems listed below. Physiatrist and rehab team continue to assess barriers to discharge/monitor patient progress toward functional and medical goals  Care Tool:  Bathing    Body parts bathed by patient: Left arm, Chest, Abdomen, Front perineal area, Right upper leg, Face, Right arm, Left lower leg, Right lower leg, Left upper leg   Body parts bathed by helper: Right arm, Buttocks, Right lower leg, Left lower leg     Bathing assist Assist Level: Minimal Assistance - Patient > 75%     Upper Body Dressing/Undressing Upper body dressing   What is the patient wearing?: Pull over shirt    Upper body assist Assist Level: Set up assist    Lower Body Dressing/Undressing Lower body dressing      What is the patient wearing?: Pants     Lower body assist Assist for lower body dressing: Contact Guard/Touching assist Assistive Device Comment: reacher   Toileting Toileting Toileting Activity did not occur (Clothing management and hygiene only): Refused  Toileting assist Assist for toileting: Moderate Assistance - Patient 50 - 74%     Transfers Chair/bed transfer  Transfers assist  Chair/bed transfer activity did not occur: Refused  Chair/bed transfer assist  level: Contact Guard/Touching assist     Locomotion Ambulation   Ambulation assist      Assist level: Minimal Assistance - Patient > 75% Assistive device: Walker-rolling Max distance: 12'   Walk 10 feet activity   Assist     Assist level: Minimal Assistance - Patient > 75% Assistive device: Walker-rolling   Walk 50 feet activity   Assist Walk 50 feet with 2 turns activity did not occur: Safety/medical concerns  Assist level: Minimal Assistance - Patient > 75% Assistive device: Walker-rolling    Walk 150 feet activity   Assist Walk  150 feet activity did not occur: Safety/medical concerns         Walk 10 feet on uneven surface  activity   Assist Walk 10 feet on uneven surfaces activity did not occur: Safety/medical concerns         Wheelchair     Assist Is the patient using a wheelchair?: Yes Type of Wheelchair: Manual    Wheelchair assist level: Maximal Assistance - Patient 25 - 49% Max wheelchair distance: 20    Wheelchair 50 feet with 2 turns activity    Assist        Assist Level: Maximal Assistance - Patient 25 - 49% (per PT documentation)   Wheelchair 150 feet activity     Assist      Assist Level: Total Assistance - Patient < 25% (per PT documentation)   Blood pressure (!) 136/58, pulse (!) 53, temperature 98.4 F (36.9 C), resp. rate 20, height 5' 7.01" (1.702 m), weight 100.7 kg, SpO2 100 %.  Medical Problem List and Plan: 1. 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  Antithrombotics: -DVT/anticoagulation:  Pharmaceutical: Heparin             -antiplatelet therapy: Aspirin 81 mg daily and Plavix 75 mg day x3 weeks then Plavix alone- d'ced ASA    3. Pain Management: Tylenol as needed  -left shoulder still tender  -continue aspercreme for left shoulder -can use muscle rub also   -continue k-tape and ROM with therapy   -will inject today 1/31 4. Mood and sleep/wake:         Continue ritalin 5mg  BID  to help with motivation/arousal- monitor effect prior to increasing dose if needed              -antipsychotic agents: N/A 5. Neuropsych: This patient is capable of making decisions on his own behalf. 6. Skin/Wound Care: Routine skin checks 7. Fluids/Electrolytes/Nutrition:     -BUN  elevated---have asked pt to push fluids-- recheck bmet today 1/31 8.  Diabetes mellitus.  Hemoglobin A1c 9.9.  NovoLog 3 units 3 times daily, Levemir  units  daily.    CBG (last 3)  Recent Labs    05/25/21 1650 05/25/21 2131 05/26/21 0646  GLUCAP 167* 160* 139*    1/31 fair to good control  9.  Hypertension.  Lisinopril 40 mg daily, Toprol-XL 50 mg daily, hydralazine 50 mg every 8 hours, [place] 25 mg daily.   Vitals:   05/26/21 0432 05/26/21 0735  BP: 123/62 (!) 136/58  Pulse: (!) 52 Marland Kitchen)  53  Resp: 20   Temp: 98.4 F (36.9 C)   SpO2: 100%        toprol  100mg  qd,   hydralazine qid  - clonidine 0.1mg  bid-   1/31 reduce toprol to 75mg  daily d/t bradycardia 10.  Hyperlipidemia.  continue Lipitor 11.  History of gout.  continue Allopurinol.  Monitor for any gout flareups   13.  Aortic stenosis- OP f/u with cardiology   LOS: 17 days A FACE TO FACE EVALUATION WAS PERFORMED  Meredith Staggers 05/26/2021, 8:58 AM

## 2021-05-27 DIAGNOSIS — M7522 Bicipital tendinitis, left shoulder: Secondary | ICD-10-CM

## 2021-05-27 LAB — GLUCOSE, CAPILLARY
Glucose-Capillary: 202 mg/dL — ABNORMAL HIGH (ref 70–99)
Glucose-Capillary: 201 mg/dL — ABNORMAL HIGH (ref 70–99)
Glucose-Capillary: 200 mg/dL — ABNORMAL HIGH (ref 70–99)
Glucose-Capillary: 249 mg/dL — ABNORMAL HIGH (ref 70–99)
Glucose-Capillary: 233 mg/dL — ABNORMAL HIGH (ref 70–99)

## 2021-05-27 MED ORDER — METOPROLOL SUCCINATE ER 50 MG PO TB24
75.0000 mg | ORAL_TABLET | Freq: Every day | ORAL | Status: DC
  Administered 2021-05-28 – 2021-05-30 (×3): 75 mg via ORAL
  Filled 2021-05-27 (×3): qty 1

## 2021-05-27 NOTE — Progress Notes (Signed)
Speech Language Pathology Daily Session Note  Patient Details  Name: Monique Gift MRN: [id number] Date of Birth: 03-Jun-1945  Today's Date: 05/27/2021 SLP Individual Time: 1435-1500 SLP Individual Time Calculation (min): 25 min  Short Term Goals: Week 3: SLP Short Term Goal 1 (Week 3): STGs=LTGs due to ELOS  Skilled Therapeutic Interventions: Skilled treatment session focused on cognitive goals. Upon arrival, patient was awake, alert and agreeable to participate in treatment session. Patient reported that he was "trying" more since he no longer has a place to discharge to and feels as he needs to be more independent. Patient became tearful when talking about current situation. SLP provided emotional support. Patient independently recalled events from previous therapy sessions and was independently oriented to time. Patient also recalled number of medications independently but required total A for recall of his current medications and their functions. Patient left upright in recliner with alarm on and all needs within reach. Continue with current plan of care.      Pain No/Denies Pain   Therapy/Group: Individual Therapy  Koston Hennes 05/27/2021, 3:23 PM

## 2021-05-27 NOTE — Progress Notes (Signed)
Physical Therapy Session Note  Patient Details  Name: Finley Chevez MRN: [id number] Date of Birth: 1945-06-01  Today's Date: 05/27/2021 PT Individual Time: 0902-0944 PT Individual Time Calculation (min): 42 min   Short Term Goals: Week 2:  PT Short Term Goal 1 (Week 2): STGs = LTGs  Skilled Therapeutic Interventions/Progress Updates:     Pt received supine in bed and agrees to therapy. No complaint of pain. Supine to sit with bed features and cues for positioning at EOB. Pt performs stand step transfer to [facility] with RW and setup assistance. WC transport to gym for time management. Pt ambulates x409' with RW and close supervision, with cues to maintain upright gaze to improve posture and balance. Pt states that he has "been looking at thr ground for 77 years". Minimally receptive to feedback. PT also cues to increase gait speed to decrease risk for falls. Pt takes approximately 12 minutes to ambulate above distance, for average gait speed of 0.57 feet/sec, indicating that pt is at high risk for falls and not ambulating at community level, despite distance. WC transport back to room. Pt performs stand step transfer to recliner with RW and cues to increase eccentric control of stand to sit. Left with alarm intact and all needs within reach.  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 Restrictions Weight Bearing Restrictions: No   Therapy/Group: Individual Therapy  [name], PT, DPT 05/27/2021, 4:32 PM

## 2021-05-27 NOTE — Progress Notes (Signed)
PROGRESS NOTE   Subjective/Complaints: Pt says left shoulder feels better today. Participated in more therapies yesterday after our talk  ROS: Limited due to cognitive/behavioral    Objective:   No results found. No results for input(s): WBC, HGB, HCT, PLT in the last 72 hours.  No results for input(s): NA, K, CL, CO2, GLUCOSE, BUN, CREATININE, CALCIUM in the last 72 hours.    Intake/Output Summary (Last 24 hours) at 05/27/2021 0849 Last data filed at 05/27/2021 0730 Gross per 24 hour  Intake 297 ml  Output 700 ml  Net -403 ml         Physical Exam: Vital Signs Blood pressure (!) 156/58, pulse 64, temperature 98.1 F (36.7 C), resp. rate 20, height 5' 7.01" (1.702 m), weight 100.7 kg, SpO2 100 %.   Constitutional: No distress . Vital signs reviewed. HEENT: NCAT, EOMI, oral membranes moist Neck: supple Cardiovascular: RRR without murmur. No JVD    Respiratory/Chest: CTA Bilaterally without wheezes or rales. Normal effort    GI/Abdomen: BS +, non-tender, non-distended Ext: no clubbing, cyanosis, or edema Psych: pleasant and cooperative  Skin: No evidence of breakdown, no evidence of rash  Neurologic: more alert.  Follows basic commands with some delay. Cranial nerves II through XII intact, motor strength is 5/5 in right deltoid, bicep, tricep, grip,  and bilateral hip flexor, knee extensors, ankle dorsiflexor and plantar flexor. Prox left upper limited by pain--stable exam Sensory exam normal for light touch and pain in all 4 limbs. No limb ataxia or cerebellar signs. No abnormal tone appreciated.   Musculoskeletal: left shoulder still tender. Has pain along biceps tendons somewhat, less pain with IR/ER today   Assessment/Plan: 1. Functional deficits which require 3+ hours per day of interdisciplinary therapy in a comprehensive inpatient rehab setting. Physiatrist is providing close team supervision and 24 hour  management of active medical problems listed below. Physiatrist and rehab team continue to assess barriers to discharge/monitor patient progress toward functional and medical goals  Care Tool:  Bathing    Body parts bathed by patient: Left arm, Chest, Abdomen, Front perineal area, Right upper leg, Face, Right arm, Left lower leg, Right lower leg, Left upper leg   Body parts bathed by helper: Right arm, Buttocks, Right lower leg, Left lower leg     Bathing assist Assist Level: Minimal Assistance - Patient > 75%     Upper Body Dressing/Undressing Upper body dressing   What is the patient wearing?: Pull over shirt    Upper body assist Assist Level: Set up assist    Lower Body Dressing/Undressing Lower body dressing      What is the patient wearing?: Pants     Lower body assist Assist for lower body dressing: Contact Guard/Touching assist Assistive Device Comment: reacher   Toileting Toileting Toileting Activity did not occur (Clothing management and hygiene only): Refused  Toileting assist Assist for toileting: Moderate Assistance - Patient 50 - 74%     Transfers Chair/bed transfer  Transfers assist  Chair/bed transfer activity did not occur: Refused  Chair/bed transfer assist level: Contact Guard/Touching assist     Locomotion Ambulation   Ambulation assist      Assist  level: Minimal Assistance - Patient > 75% Assistive device: Walker-rolling Max distance: 12'   Walk 10 feet activity   Assist     Assist level: Minimal Assistance - Patient > 75% Assistive device: Walker-rolling   Walk 50 feet activity   Assist Walk 50 feet with 2 turns activity did not occur: Safety/medical concerns  Assist level: Minimal Assistance - Patient > 75% Assistive device: Walker-rolling    Walk 150 feet activity   Assist Walk 150 feet activity did not occur: Safety/medical concerns         Walk 10 feet on uneven surface  activity   Assist Walk 10 feet on  uneven surfaces activity did not occur: Safety/medical concerns         Wheelchair     Assist Is the patient using a wheelchair?: Yes Type of Wheelchair: Manual    Wheelchair assist level: Maximal Assistance - Patient 25 - 49% Max wheelchair distance: 20    Wheelchair 50 feet with 2 turns activity    Assist        Assist Level: Maximal Assistance - Patient 25 - 49% (per PT documentation)   Wheelchair 150 feet activity     Assist      Assist Level: Total Assistance - Patient < 25% (per PT documentation)   Blood pressure (!) 156/58, pulse 64, temperature 98.1 F (36.7 C), resp. rate 20, height 5' 7.01" (1.702 m), weight 100.7 kg, SpO2 100 %.  Medical Problem List and Plan: 1. 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  Antithrombotics: -DVT/anticoagulation:  Pharmaceutical: Heparin             -antiplatelet therapy: Aspirin 81 mg daily and Plavix 75 mg day x3 weeks then Plavix alone- d'ced ASA    3. Pain Management: Tylenol as needed  -left shoulder still tender  -continue aspercreme for left shoulder -can use muscle rub also   -continue k-tape and ROM with therapy   2/1 injected left shoulder yesterday, likely rtc and biceps tendonitis, rtc sx seemed more prominent 4. Mood and sleep/wake:         Continue ritalin 5mg  BID  to help with motivation/arousal- monitor effect prior to increasing dose if needed              -antipsychotic agents: N/A 5. Neuropsych: This patient is capable of making decisions on his own behalf. 6. Skin/Wound Care: Routine skin checks 7. Fluids/Electrolytes/Nutrition:     -BUN elevated---have asked pt to push fluids-- recheck bmet today 1/31 8.   Diabetes mellitus.  Hemoglobin A1c 9.9.  NovoLog 3 units 3 times daily, Levemir  units  daily.    CBG (last 3)  Recent Labs    05/26/21 1659 05/26/21 2207 05/27/21 0546  GLUCAP 155* 209* 202*    21 likely some elevation after jt injection---SSI covg 9.  Hypertension.  Lisinopril 40 mg daily, Toprol-XL 50 mg daily, hydralazine 50 mg every 8 hours, Hygroton 25 mg daily.   Vitals:   05/27/21 0508 05/27/21 0725  BP: (!) 164/67 (!) 156/58  Pulse: (!) 59 64  Resp: 20   Temp:  98.1 F (36.7 C)   SpO2: 100%        toprol  100mg  qd,   hydralazine qid  - clonidine 0.1mg  bid-   2/1 reduce toprol to 75mg  daily d/t bradycardia 10.  Hyperlipidemia.  continue Lipitor 11.  History of gout.  continue Allopurinol.  Monitor for any gout flareups   13.  Aortic stenosis- OP f/u with cardiology   LOS: 18 days A FACE TO FACE EVALUATION WAS PERFORMED  Ranelle OysterZachary T Chirstopher Iovino 05/27/2021, 8:49 AM

## 2021-05-27 NOTE — Progress Notes (Addendum)
Patient ID: Manuel Chapman, male   DOB: 09/26/1945, 76 y.o.   MRN: [id number]  SW received message from [place] reporting that appeal was denied and this case is being forwarded to federal [employer] team [id number] Appeal #[id number]).  SW called [employer] and spoke with a representative to get an idea on process and how long it takes for appeal results. Reported that Appeal ID provided was not a federal appeal for [employer] but for patient's insurance and will need his Medicare ID# to look up to see if there is an appeal in the system.   SW spoke with [place]/[employer] Appeals Dept [phone] option #3/f: [phone]/ Auth ID # [id number]) to discuss challenges with getting an answer. Did not see [employer] appeal information. Provided Medicare ID# [id number]. When discussing the reason patient was denied, indicates skilled nursing home level of care is not medically necessary per Medicare guidelines.   SW spoke with Winferd Humphrey with [employer] to check status of appeals. Reports he was not able to find an appeal with this Medicare ID#. States to allow 24hrs to see if the referral was received (call back later today). Reports that if it is not in the system checking today or tomorrow, then it is likely their own internal audit and will need to follow-up with [employer] to discuss further.   SW left message for SW spoke with Michelle/Admissions with [facility] [phone]) to inform on above, and see if a payment arrangement is an option.  *SW spoke with [name] who reported policy is to pay for room and board for 30 days in advanced until LTC Medicaid is approved. Sw will follow-up once there is more information.  SW spoke with pt dtr [name] to inform on above. She expressed frustration as there is not anyone willing to help and assist with paying for his room and board, she also indicated there is not anyone able too or willing to care for him. She is unable to provide care due to working. SW  did share an APS referral will be submitted. She reported she submitted one last week as well.   SW spoke with [facility] APS [phone]) to submit referral. Waiting to see if case will be accepted.   SW updated medical team.   *SW received updates from [facility] reporting that case was not accepted, but for placement referral. Reprots that a Education officer, museum will assist with trying to get patient placed. She is not sure how soon a caseworker will be out.   SW met with pt in room to discuss above.   Loralee Pacas, MSW, [place] Office: [phone] Cell: [phone] Fax: [phone]

## 2021-05-27 NOTE — Progress Notes (Signed)
Occupational Therapy Session Note  Patient Details  Name: Manuel Chapman MRN: [id number] Date of Birth: 1945/07/11  Today's Date: 05/27/2021 OT Individual Time: 1116-1200 OT Individual Time Calculation (min): 44 min    Short Term Goals: Week 3:  OT Short Term Goal 1 (Week 3): LTG=STG 2/2 ELOS  Skilled Therapeutic Interventions/Progress Updates:  Pt greeted seated in recliner agreeable to OT intervention. Session focus on LUE NMR, functional mobility, dynamic standing balance and decreasing overall caregiver burden. Pt reports feeling "lost" before OTA entered as pt unable to locate his schedule. OTA retrieved pants and pt donned pants from recliner with MIN A as reacher unavailable. Pt completed stand pivot to w/c with no AD and CGA. Pt transported to [place] with total A where pt completed various therapeutic activities focused on dynamic standing balance, LUE NMR, LUE AROM and overall standing endurance. Pt completed all standing tasks with overall CGA. Pt c/o pain in L shoulder when reaching > 90* therefore kept reaching to < 90*. Pt transported back to room with total A where pt completed stand pivot back to recliner with no AD and CGA. Pt left seated in recliner with chair alarm activated and all needs within reach.                        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

## 2021-05-28 LAB — GLUCOSE, CAPILLARY
Glucose-Capillary: 173 mg/dL — ABNORMAL HIGH (ref 70–99)
Glucose-Capillary: 195 mg/dL — ABNORMAL HIGH (ref 70–99)
Glucose-Capillary: 249 mg/dL — ABNORMAL HIGH (ref 70–99)
Glucose-Capillary: 165 mg/dL — ABNORMAL HIGH (ref 70–99)

## 2021-05-28 NOTE — Progress Notes (Signed)
PROGRESS NOTE   Subjective/Complaints: No new issues. Shoulder doing better. Participating in therapy  ROS: Limited due to cognitive/behavioral    Objective:   No results found. No results for input(s): WBC, HGB, HCT, PLT in the last 72 hours.  No results for input(s): NA, K, CL, CO2, GLUCOSE, BUN, CREATININE, CALCIUM in the last 72 hours.    Intake/Output Summary (Last 24 hours) at 05/28/2021 0923 Last data filed at 05/28/2021 0754 Gross per 24 hour  Intake 940 ml  Output 425 ml  Net 515 ml         Physical Exam: Vital Signs Blood pressure (!) 150/52, pulse 60, temperature 97.8 F (36.6 C), temperature source Oral, resp. rate 18, height 5' 7.01" (1.702 m), weight 100.7 kg, SpO2 100 %.   Constitutional: No distress . Vital signs reviewed. HEENT: NCAT, EOMI, oral membranes moist Neck: supple Cardiovascular: RRR without murmur. No JVD    Respiratory/Chest: CTA Bilaterally without wheezes or rales. Normal effort    GI/Abdomen: BS +, non-tender, non-distended Ext: no clubbing, cyanosis, or edema Psych: pleasant and cooperative  Skin: No evidence of breakdown, no evidence of rash  Neurologic: more alert.  Follows basic commands with some delay. Cranial nerves II through XII intact, motor strength is 5/5 in right deltoid, bicep, tricep, grip,  and bilateral hip flexor, knee extensors, ankle dorsiflexor and plantar flexor. Prox left upper limited by pain--stable exam Sensory exam normal for light touch and pain in all 4 limbs. No limb ataxia or cerebellar signs. No abnormal tone appreciated.   Musculoskeletal: left shoulder more movable.    Assessment/Plan: 1. Functional deficits which require 3+ hours per day of interdisciplinary therapy in a comprehensive inpatient rehab setting. Physiatrist is providing close team supervision and 24 hour management of active medical problems listed below. Physiatrist and rehab team  continue to assess barriers to discharge/monitor patient progress toward functional and medical goals  Care Tool:  Bathing    Body parts bathed by patient: Left arm, Chest, Abdomen, Front perineal area, Right upper leg, Face, Right arm, Left lower leg, Right lower leg, Left upper leg   Body parts bathed by helper: Right arm, Buttocks, Right lower leg, Left lower leg     Bathing assist Assist Level: Minimal Assistance - Patient > 75%     Upper Body Dressing/Undressing Upper body dressing   What is the patient wearing?: Pull over shirt    Upper body assist Assist Level: Set up assist    Lower Body Dressing/Undressing Lower body dressing      What is the patient wearing?: Pants     Lower body assist Assist for lower body dressing: Minimal Assistance - Patient > 75% Assistive Device Comment: reacher   Toileting Toileting Toileting Activity did not occur Press photographer(Clothing management and hygiene only): Refused  Toileting assist Assist for toileting: Moderate Assistance - Patient 50 - 74%     Transfers Chair/bed transfer  Transfers assist  Chair/bed transfer activity did not occur: Refused  Chair/bed transfer assist level: Contact Guard/Touching assist     Locomotion Ambulation   Ambulation assist      Assist level: Minimal Assistance - Patient > 75% Assistive device: Walker-rolling  Max distance: 12'   Walk 10 feet activity   Assist     Assist level: Minimal Assistance - Patient > 75% Assistive device: Walker-rolling   Walk 50 feet activity   Assist Walk 50 feet with 2 turns activity did not occur: Safety/medical concerns  Assist level: Minimal Assistance - Patient > 75% Assistive device: Walker-rolling    Walk 150 feet activity   Assist Walk 150 feet activity did not occur: Safety/medical concerns         Walk 10 feet on uneven surface  activity   Assist Walk 10 feet on uneven surfaces activity did not occur: Safety/medical concerns          Wheelchair     Assist Is the patient using a wheelchair?: Yes Type of Wheelchair: Manual    Wheelchair assist level: Maximal Assistance - Patient 25 - 49% Max wheelchair distance: 20    Wheelchair 50 feet with 2 turns activity    Assist        Assist Level: Maximal Assistance - Patient 25 - 49% (per PT documentation)   Wheelchair 150 feet activity     Assist      Assist Level: Total Assistance - Patient < 25% (per PT documentation)   Blood pressure (!) 150/52, pulse 60, temperature 97.8 F (36.6 C), temperature source Oral, resp. rate 18, height 5' 7.01" (1.702 m), weight 100.7 kg, SpO2 100 %.  Medical Problem List and Plan: 1. 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  Antithrombotics: -DVT/anticoagulation:  Pharmaceutical: Heparin             -antiplatelet therapy: Aspirin 81 mg daily and Plavix 75 mg day x3 weeks then Plavix alone- d'ced ASA    3. Pain Management: Tylenol as needed  -left shoulder improving  -continue aspercreme for left shoulder -can use muscle rub also   -continue k-tape and ROM with therapy    injected left shoulder 1/31, likely rtc and biceps tendonitis, rtc sx seemed more prominent 4. Mood and sleep/wake:         Continue ritalin 5mg  BID  to help with motivation/arousal- monitor effect prior to increasing dose if needed              -antipsychotic agents: N/A 5. Neuropsych: This patient is capable of making decisions on his own behalf. 6. Skin/Wound Care: Routine skin checks 7. Fluids/Electrolytes/Nutrition:     -BUN elevated---have asked pt to push fluids-- recheck bmet today 1/31 8.  Diabetes mellitus.  Hemoglobin A1c 9.9.  NovoLog 3  units 3 times daily, Levemir  units  daily.    CBG (last 3)  Recent Labs    05/27/21 2050 05/27/21 2300 05/28/21 0644  GLUCAP 249* 233* 173*    2/2 likely still some elevation after jt injection---SSI covg for now 9.  Hypertension.  Lisinopril 40 mg daily, Toprol-XL 50 mg daily, hydralazine 50 mg every 8 hours, Hygroton 25 mg daily.   Vitals:   05/28/21 0708 05/28/21 0756  BP: (!) 123/54 (!) 150/52  Pulse: 60 60  Resp: 18   Temp: 97.8 F (36.6  C)   SpO2: 100%        toprol  100mg  qd,   hydralazine qid  - clonidine 0.1mg  bid-   2/2 reduce toprol to 75mg  daily d/t bradycardia==stable 10.  Hyperlipidemia.  continue Lipitor 11.  History of gout.  continue Allopurinol.  Monitor for any gout flareups   13.  Aortic stenosis- OP f/u with cardiology   LOS: 19 days A FACE TO FACE EVALUATION WAS PERFORMED  05/28/2021, 9:23 AM

## 2021-05-28 NOTE — Evaluation (Signed)
Recreational Therapy Assessment and Plan  Patient Details  Name: Manuel Chapman MRN: [id number] Date of Birth: May 09, 1945 Today's Date: 05/28/2021  Rehab Potential:   ELOS:     Assessment Hospital Problem: Principal Problem:   Thrombotic cerebral infarction [facility])     Past Medical History:      Past Medical History:  Diagnosis Date   CVA (cerebral vascular accident) ([place]) 2020   Diabetes mellitus     Hypertension     ICH (intracerebral hemorrhage) ([place]) 11/2019    Past Surgical History: No past surgical history on file.    Clinical Impression: [name] is a 76 year old right-handed male with history of hypertension, diabetes mellitus, ICH left subcortical and small right temporal embolic infarct 2/91 and was cleared to begin low-dose aspirin at time of discharge, quit smoking 25 years ago.  Per chart review patient currently lives with a friend.  He is married but currently separated.  Patient does have good family support.  Patient was able to feed himself dress groom and toilet himself but does have a sedentary lifestyle.  Caregiver/friend does assist with some meal preparation and medication management.  Presented 05/05/2021 with dizziness and vomiting x3 days.  CT/MRI showed large region of acute/early subacute infarct affecting the inferior cerebellum on the right consistent with right PICA territory infarction.  Petechial blood products but no frank hematoma.  Mass-effect upon the fourth ventricle without evidence of obstruction.  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  No large vessel occlusion or hemodynamically significant stenosis.  Admission chemistry unremarkable except glucose 300, hemoglobin 10.5, WBC 11,400, urine drug screen negative, hemoglobin A1c 9.9.  Echocardiogram with ejection fraction of 45 to 91% grade 2 diastolic  dysfunction.  Neurology follow-up maintained on aspirin and Plavix DAPT for 3 weeks then Plavix alone.  Patient declined loop recorder but plan 30-day cardiac event monitor as outpatient.  Subcutaneous heparin for DVT prophylaxis.  Tolerating a regular consistency diet.  Therapy evaluations completed due to patient decreased functional mobility was admitted for a comprehensive rehab program.Patient transferred to CIR on 05/09/2021 .     Pt presents with decreased activity tolerance, decreased functional mobility, decreased balance, decreased coordination, decreased attention, decreased problem solving, decreased safety awareness, and delayed processing, feelings of stress Limiting pt's independence with leisure/community pursuits.  Met with pt today to discuss TR services.  Pt emotional/tearful throughout this visit sharing his challenges in discharge planning and lack of family support.  Emotional support provided.    Plan  Min 1 TR session >20 minutes per week  Recommendations for other services: None   Discharge Criteria: Patient will be discharged from TR if patient refuses treatment 3 consecutive times without medical reason.  If treatment goals not met, if there is a change in medical status, if patient makes no progress towards goals or if patient is discharged from hospital.  The above assessment, treatment plan, treatment alternatives and goals were discussed and mutually agreed upon: by patient  [place] 05/28/2021, 8:42 AM

## 2021-05-28 NOTE — Progress Notes (Incomplete)
Patient continue to rest throughout shift, verbalize tenderness and soreness to left upper shoulder area but states that prior I"njection to area is making it feel some what better", applied muscle rub as ordered to area and encouraged to reposition, Assisted up to BR x 1 staff and tolerated well. Call bell and bed alarm on,per unit parameters and policy,encourage patient to use Provale cup at all time when consuming his po liquids.

## 2021-05-28 NOTE — Progress Notes (Signed)
Physical Therapy Weekly Progress Note  Patient Details  Name: Manuel Chapman MRN: [id number] Date of Birth: 05-13-1945  Beginning of progress report period: May 19, 2021 End of progress report period: May 28, 2021  Today's Date: 05/28/2021 PT Individual Time: 1402-1430 PT Individual Time Calculation (min): 28 min   Patient has met 0 of 0 short term goals. Short term goals not created due to ELOS being extended as pt is not awaiting placement in a SNF. Pt is mobilizing at [facility] level to supervision, depending on difficulty of task. Pt continues to be limited by L sided strength and balance impairments, and is minimally receptive to cueing on gait deficits. Pt ambulates up to 500' with RW but gait speed is extremely slow.  Patient continues to demonstrate the following deficits muscle weakness, decreased cardiorespiratoy endurance, decreased attention to left, and decreased standing balance, decreased postural control, hemiplegia, and decreased balance strategies and therefore will continue to benefit from skilled PT intervention to increase functional independence with mobility.  Patient progressing toward long term goals..  Continue plan of care.  PT Short Term Goals Week 2:  PT Short Term Goal 1 (Week 2): STGs = LTGs Week 3:     Skilled Therapeutic Interventions/Progress Updates:     Pt received seated in WC and agrees to therapy. No complaint of pain. WC transport to gym for time management. Pt performs stand step transfer to Nustep with cues for sequencing and positioning. Pt completes Nustep for strength and endurance training, as well as reciprocal coordination. Pt demonstrates improved ability to maintain grasp with L hand without having to regrip or be cued to attend to L side. Pt completes x12:00 at workload of 5 with average steps per minute ~40.  Pt ambulates back to room, x160', with RW and close supervision, with cues to increase upright gaze to improve posture and  balance. Pt left seated in recliner with alarm intact and all needs within reach.  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

## 2021-05-28 NOTE — Progress Notes (Signed)
Occupational Therapy Session Note  Patient Details  Name: Manuel Chapman MRN: [id number] Date of Birth: January 11, 1946  Today's Date: 05/28/2021 OT Individual Time: 2527-1292 OT Individual Time Calculation (min): 28 min    Short Term Goals: Week 3:  OT Short Term Goal 1 (Week 3): LTG=STG 2/2 ELOS  Skilled Therapeutic Interventions/Progress Updates:    Pt greeted semi-reclined in bed and agreeable to OT treatment session. Pt completed bed mobility with min A and HOB elevated. Pt with excessive incontinence with urine soaking the entire bed and dripping from gown with pt demonstrating NO awareness that he was covered in urine. Pt performed sit<>stand with RW and CGA, then needed total A to cleanse buttocks with noted incontinence of BM as well. Pt also unaware that he had had a BM. Pt assisted with washing front peri-area. OT donned clean brief, then pt pivoted to recliner to finish washing up. OT assist to wash lower legs and back which were also all covered in urine. Mod A to thread pants and don socks due to time management. Pt left seated in recliner with alarm pad on, call bell in reach, and needs met. Nursing notified of pt status.  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

## 2021-05-29 LAB — BASIC METABOLIC PANEL
Anion gap: 15 (ref 5–15)
BUN: 38 mg/dL — ABNORMAL HIGH (ref 8–23)
CO2: 18 mmol/L — ABNORMAL LOW (ref 22–32)
Calcium: 8.8 mg/dL — ABNORMAL LOW (ref 8.9–10.3)
Chloride: 104 mmol/L (ref 98–111)
Creatinine, Ser: 1.15 mg/dL (ref 0.61–1.24)
GFR, Estimated: >60 mL/min (ref >60)
Glucose, Bld: 203 mg/dL — ABNORMAL HIGH (ref 70–99)
Potassium: 5 mmol/L (ref 3.5–5.1)
Sodium: 137 mmol/L (ref 135–145)

## 2021-05-29 LAB — GLUCOSE, CAPILLARY
Glucose-Capillary: 122 mg/dL — ABNORMAL HIGH (ref 70–99)
Glucose-Capillary: 154 mg/dL — ABNORMAL HIGH (ref 70–99)
Glucose-Capillary: 221 mg/dL — ABNORMAL HIGH (ref 70–99)
Glucose-Capillary: 251 mg/dL — ABNORMAL HIGH (ref 70–99)
Glucose-Capillary: 217 mg/dL — ABNORMAL HIGH (ref 70–99)

## 2021-05-29 NOTE — Progress Notes (Signed)
Occupational Therapy Session Note  Patient Details  Name: Romulus Hanrahan MRN: [id number] Date of Birth: 1945/07/22  Today's Date: 05/29/2021 OT Individual Time: 1349-1415 OT Individual Time Calculation (min): 26 min   Short Term Goals: Week 3:  OT Short Term Goal 1 (Week 3): LTG=STG 2/2 ELOS  Skilled Therapeutic Interventions/Progress Updates:    Pt greeted seated in recliner and agreeable to OT treatment session. Pt only with gown on. Worked on dressing stragegies with trying to step into pants and reaching forward to pull them up. Pt hesitant to try reaching forward with L UE, but was able to get there with min A. Sit<>stand with RW and close supervision. OT addressed L shoulder ROM with gentle stretching, then applied kinesiotape for pain management, although tolerated well with no reports of pain. Pt left seated in recliner with chair alarm on, call bell in reach and needs met.   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

## 2021-05-29 NOTE — Progress Notes (Addendum)
PROGRESS NOTE   Subjective/Complaints: Left shoulder still sore but improved from prior to injection. Has continued to participate more in therapies  ROS: Limited due to cognitive/behavioral    Objective:   No results found. No results for input(s): WBC, HGB, HCT, PLT in the last 72 hours.  No results for input(s): NA, K, CL, CO2, GLUCOSE, BUN, CREATININE, CALCIUM in the last 72 hours.    Intake/Output Summary (Last 24 hours) at 05/29/2021 1112 Last data filed at 05/29/2021 0700 Gross per 24 hour  Intake 540 ml  Output 550 ml  Net -10 ml         Physical Exam: Vital Signs Blood pressure 124/72, pulse (!) 59, temperature 97.8 F (36.6 C), temperature source Oral, resp. rate 17, height 5' 7.01" (1.702 m), weight 100.7 kg, SpO2 100 %.   Constitutional: No distress . Vital signs reviewed. HEENT: NCAT, EOMI, oral membranes moist Neck: supple Cardiovascular: RRR without murmur. No JVD    Respiratory/Chest: CTA Bilaterally without wheezes or rales. Normal effort    GI/Abdomen: BS +, non-tender, non-distended Ext: no clubbing, cyanosis, or edema Psych: pleasant and cooperative  Skin: No evidence of breakdown, no evidence of rash  Neurologic: more alert.  Follows basic commands with some delay. Cranial nerves II through XII intact, motor strength is 5/5 in right deltoid, bicep, tricep, grip,  and bilateral hip flexor, knee extensors, ankle dorsiflexor and plantar flexor. Prox left upper limited by pain--stable exam Sensory exam normal for light touch and pain in all 4 limbs. No limb ataxia or cerebellar signs. No abnormal tone appreciated.   Musculoskeletal: has shoulder pain at about 80 degrees of abduction, remains tight in ABD,ER, IR   Assessment/Plan: 1. Functional deficits which require 3+ hours per day of interdisciplinary therapy in a comprehensive inpatient rehab setting. Physiatrist is providing close team  supervision and 24 hour management of active medical problems listed below. Physiatrist and rehab team continue to assess barriers to discharge/monitor patient progress toward functional and medical goals  Care Tool:  Bathing    Body parts bathed by patient: Left arm, Chest, Abdomen, Front perineal area, Right upper leg, Face, Right arm, Left lower leg, Right lower leg, Left upper leg   Body parts bathed by helper: Right arm, Buttocks, Right lower leg, Left lower leg     Bathing assist Assist Level: Minimal Assistance - Patient > 75%     Upper Body Dressing/Undressing Upper body dressing   What is the patient wearing?: Pull over shirt    Upper body assist Assist Level: Set up assist    Lower Body Dressing/Undressing Lower body dressing      What is the patient wearing?: Pants     Lower body assist Assist for lower body dressing: Minimal Assistance - Patient > 75% Assistive Device Comment: reacher   Toileting Toileting Toileting Activity did not occur Press photographer and hygiene only): Refused  Toileting assist Assist for toileting: Moderate Assistance - Patient 50 - 74%     Transfers Chair/bed transfer  Transfers assist  Chair/bed transfer activity did not occur: Refused  Chair/bed transfer assist level: Contact Guard/Touching assist     Locomotion Ambulation   Ambulation  assist      Assist level: Minimal Assistance - Patient > 75% Assistive device: Walker-rolling Max distance: 12'   Walk 10 feet activity   Assist     Assist level: Minimal Assistance - Patient > 75% Assistive device: Walker-rolling   Walk 50 feet activity   Assist Walk 50 feet with 2 turns activity did not occur: Safety/medical concerns  Assist level: Minimal Assistance - Patient > 75% Assistive device: Walker-rolling    Walk 150 feet activity   Assist Walk 150 feet activity did not occur: Safety/medical concerns         Walk 10 feet on uneven surface   activity   Assist Walk 10 feet on uneven surfaces activity did not occur: Safety/medical concerns         Wheelchair     Assist Is the patient using a wheelchair?: Yes Type of Wheelchair: Manual    Wheelchair assist level: Maximal Assistance - Patient 25 - 49% Max wheelchair distance: 20    Wheelchair 50 feet with 2 turns activity    Assist        Assist Level: Maximal Assistance - Patient 25 - 49% (per PT documentation)   Wheelchair 150 feet activity     Assist      Assist Level: Total Assistance - Patient < 25% (per PT documentation)   Blood pressure 124/72, pulse (!) 59, temperature 97.8 F (36.6 C), temperature source Oral, resp. rate 17, height 5' 7.01" (1.702 m), weight 100.7 kg, SpO2 100 %.  Medical Problem List and Plan: 1. 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  Antithrombotics: -DVT/anticoagulation:  Pharmaceutical: Heparin             -antiplatelet therapy: Aspirin 81 mg daily and Plavix 75 mg day x3 weeks then Plavix alone- d'ced ASA    3. Pain Management: Tylenol as needed  -left shoulder improved although rom still limited  -continue aspercreme for left shoulder -can use muscle rub also   -reviewed stretches HE needs to work on while by himself in room    Subacromial left shoulder injection 1/31  4. Mood and sleep/wake:         Continue ritalin 5mg  BID  to help with motivation/arousal- monitor effect prior to increasing dose if needed              -antipsychotic agents: N/A 5. Neuropsych: This patient is capable of making decisions on his own behalf. 6. Skin/Wound Care: Routine skin checks 7. Fluids/Electrolytes/Nutrition:     -elevated BUN. BMET never done this  week. Recheck bmet today  -encouraging fluids 8.  Diabetes mellitus.  Hemoglobin A1c 9.9.  NovoLog 3 units 3 times daily, Levemir  units  daily.    CBG (last 3)  Recent Labs    05/28/21 1651 05/28/21 2032 05/29/21 0600  GLUCAP 249* 165* 122*    2/3 cbg's falling after steroid injection. Cover with ssi for now 9.  Hypertension.  Lisinopril 40 mg daily, Toprol-XL 50 mg daily, hydralazine 50 mg every 8 hours, Hygroton 25 mg daily.   Vitals:   05/29/21 0344 05/29/21 0819  BP: Marland Kitchen(!)  156/62 124/72  Pulse: (!) 59 (!) 59  Resp: 17   Temp: 97.8 F (36.6 C)   SpO2: 100%        toprol  100mg  qd,   hydralazine qid  - clonidine 0.1mg  bid-   2/3 reduced toprol to 75mg  daily d/t bradycardia, bp/hr stable 10.  Hyperlipidemia.  continue Lipitor 11.  History of gout.  continue Allopurinol.  Monitor for any gout flareups   13.  Aortic stenosis- OP f/u with cardiology   LOS: 20 days A FACE TO FACE EVALUATION WAS PERFORMED  05/29/2021, 11:12 AM

## 2021-05-29 NOTE — Progress Notes (Signed)
Physical Therapy Session Note  Patient Details  Name: Manuel Chapman MRN: [id number] Date of Birth: 06/18/1945  Today's Date: 05/29/2021 PT Individual Time: 1137-1202 PT Individual Time Calculation (min): 25 min   Short Term Goals: Week 3:  PT Short Term Goal 1 (Week 3): STGs = LTGs  Skilled Therapeutic Interventions/Progress Updates:     Pt received seated in recliner and agrees to therapy. No complaint of pain. Pt performs stand step transfer to [facility] with RW and cues to maintain upright gaze.   PT explains 6 minute walk test to pt and strongly encourages pt to give full effort and to ambulate as quickly as possible. Pt verbalizes understanding. Pt performs test and noted to be ambulating at same, very slow gait speed as is typical for this pt. PT encourages pt to increase speed and attempt to maintain upright gaze to improve posture and balance. Pt says "I've been walking this way my whole life", and "I've lose my get-up and go." PT questions whether pt is depressed and pt says that he is depressed but does not want to talk to anyone about it. Pt ambulates 265' with RW for , and additional 150' following test. Pt left seated in recliner with alarm intact and all needs within reach.  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

## 2021-05-29 NOTE — Progress Notes (Addendum)
Patient ID: Manuel Chapman, male   DOB: [birth year], 76 y.o.   MRN: [id number]  SW spoke with Manuel Chapman/Manuel Chapman team [phone]) to inquire about status of appeal. Reports appeal was denied today and a letter will be mailed out to the home. SW updated medical team. SW made efforts to inform [name] [phone]) but unable to leave message as VM not set up. SW will continue to provide updates.  *SW spoke with [name] and her husband to discuss barrier in which they are still waiting on the statement from patient's pension plan. Pt dtr also reports pt has an assigned SW with [facility]- Manuel Chapman (Aging and Adult services; [phone]). SW left message for Manuel Chapman inquiring if any assistance is needed. SW waiting on follow-up.  *SW spoke with Manuel Chapman/[place] [employer]. SW to send FL2. Reports will continue to wait on approval from [employer] since all documentation is still needed. Sw sent FL2 to: [email or web address].   Manuel Chapman, MSW, LCSWA Office: [phone] Cell: [phone] Fax: [phone]

## 2021-05-29 NOTE — Progress Notes (Signed)
Speech Language Pathology Weekly Progress and Session Note  Patient Details  Name: Manuel Chapman MRN: [id number] Date of Birth: 09-13-1945  Beginning of progress report period: May 22, 2021 End of progress report period: May 29, 2021  Today's Date: 05/29/2021 SLP Individual Time: 0705-0735 SLP Individual Time Calculation (min): 30 min  Short Term Goals: Week 3: SLP Short Term Goal 1 (Week 3): STGs=LTGs due to ELOS SLP Short Term Goal 1 - Progress (Week 3): Not met    New Short Term Goals: Week 4: SLP Short Term Goal 1 (Week 4): STGs=LTGs due to ELOS  Weekly Progress Updates: Patient has made slow gains this reporting period due to inconsistent participation. However, patient's overall participation has improved as the week has continued. Patient continues to demonstrate decreased awareness of deficits and requires encouragement for participation at times. Patient currently requires Min-Mod verbal cues for functional problem solving, recall of functional information and emergent awareness. Patient also requires ongoing encouragement and education for use of Provale cup to reduce s/s of aspiration with thin liquids.  Patient's family requested patient discharge to a SNF but awaiting approval. Patient and family education ongoing. Patient would benefit from continued skilled SLP intervention to maximize his cognitive and swallowing function as  prior to discharge.     Intensity: Minumum of 1-2 x/day, 30 to 90 minutes Frequency: 3 to 5 out of 7 days Duration/Length of Stay: TBD due to SNF placement Treatment/Interventions: Cognitive remediation/compensation;Internal/external aids;Dysphagia/aspiration precaution training;Cueing hierarchy;Environmental controls;Therapeutic Activities;DME/adaptive equipment instruction;Functional tasks;Patient/family education   Daily Session  Skilled Therapeutic Interventions:  Skilled treatment session focused on cognitive goals. Upon arrival,  patient was awake in bed. Patient had been incontinent of both bowel and bladder without awareness. Patient ambulated to the bathroom with CGA. Total A to change brief and donn pants. Prior to pulling up his pants, patient began to void on the floor without attempts to use the commode. Patient continues to report minimal awareness of need to void or have a bowel movement. Total A again to donn another brief and pants. Patient ambulated to the recliner and left with alarm on and all needs within reach. Continue with current plan of care.      Pain No/Denies Pain   Therapy/Group: Individual Therapy  Ludene Stokke 05/29/2021, 6:28 AM

## 2021-05-30 LAB — GLUCOSE, CAPILLARY
Glucose-Capillary: 153 mg/dL — ABNORMAL HIGH (ref 70–99)
Glucose-Capillary: 198 mg/dL — ABNORMAL HIGH (ref 70–99)
Glucose-Capillary: 218 mg/dL — ABNORMAL HIGH (ref 70–99)
Glucose-Capillary: 278 mg/dL — ABNORMAL HIGH (ref 70–99)

## 2021-05-30 MED ORDER — SODIUM CHLORIDE 0.9 % IV SOLN: INTRAVENOUS | Status: DC

## 2021-05-30 MED ORDER — METOPROLOL SUCCINATE ER 50 MG PO TB24
50.0000 mg | ORAL_TABLET | Freq: Every day | ORAL | Status: DC
  Administered 2021-05-31 – 2021-06-15 (×16): 50 mg via ORAL
  Filled 2021-05-30 (×15): qty 1

## 2021-05-30 MED ORDER — CLONIDINE HCL 0.2 MG PO TABS
0.2000 mg | ORAL_TABLET | Freq: Two times a day (BID) | ORAL | Status: DC
  Administered 2021-05-30 – 2021-06-08 (×18): 0.2 mg via ORAL
  Filled 2021-05-30 (×18): qty 1

## 2021-05-30 NOTE — Progress Notes (Signed)
PROGRESS NOTE   Subjective/Complaints: No complaints this morning Family visiting today Discussed normal creatinine, elevated BUN, fluids overnight with repeat BMP tomorrow.   ROS: Limited due to cognitive/behavioral    Objective:   No results found. No results for input(s): WBC, HGB, HCT, PLT in the last 72 hours.  Recent Labs    05/29/21 1302  NA 137  K 5.0  CL 104  CO2 18*  GLUCOSE 203*  BUN 38*  CREATININE 1.15  CALCIUM 8.8*      Intake/Output Summary (Last 24 hours) at 05/30/2021 1705 Last data filed at 05/30/2021 1230 Gross per 24 hour  Intake 1027 ml  Output 950 ml  Net 77 ml         Physical Exam: Vital Signs Blood pressure (!) 176/65, pulse 62, temperature 98.6 F (37 C), resp. rate 16, height 5' 7.01" (1.702 m), weight 100.7 kg, SpO2 100 %.   Constitutional: No distress . Vital signs reviewed. HEENT: NCAT, EOMI, oral membranes moist, no teeth Neck: supple Cardiovascular: RRR without murmur. No JVD    Respiratory/Chest: CTA Bilaterally without wheezes or rales. Normal effort    GI/Abdomen: BS +, non-tender, non-distended Ext: no clubbing, cyanosis, or edema Psych: pleasant and cooperative  Skin: No evidence of breakdown, no evidence of rash  Neurologic: more alert.  Follows basic commands with some delay. Cranial nerves II through XII intact, motor strength is 5/5 in right deltoid, bicep, tricep, grip,  and bilateral hip flexor, knee extensors, ankle dorsiflexor and plantar flexor. Prox left upper limited by pain--stable exam Sensory exam normal for light touch and pain in all 4 limbs. No limb ataxia or cerebellar signs. No abnormal tone appreciated.   Musculoskeletal: has shoulder pain at about 80 degrees of abduction, remains tight in ABD,ER, IR   Assessment/Plan: 1. Functional deficits which require 3+ hours per day of interdisciplinary therapy in a comprehensive inpatient rehab  setting. Physiatrist is providing close team supervision and 24 hour management of active medical problems listed below. Physiatrist and rehab team continue to assess barriers to discharge/monitor patient progress toward functional and medical goals  Care Tool:  Bathing    Body parts bathed by patient: Left arm, Chest, Abdomen, Front perineal area, Right upper leg, Face, Right arm, Left lower leg, Right lower leg, Left upper leg   Body parts bathed by helper: Right arm, Buttocks, Right lower leg, Left lower leg     Bathing assist Assist Level: Minimal Assistance - Patient > 75%     Upper Body Dressing/Undressing Upper body dressing   What is the patient wearing?: Pull over shirt    Upper body assist Assist Level: Set up assist    Lower Body Dressing/Undressing Lower body dressing      What is the patient wearing?: Pants     Lower body assist Assist for lower body dressing: Minimal Assistance - Patient > 75% Assistive Device Comment: reacher   Toileting Toileting Toileting Activity did not occur Press photographer(Clothing management and hygiene only): Refused  Toileting assist Assist for toileting: Moderate Assistance - Patient 50 - 74%     Transfers Chair/bed transfer  Transfers assist  Chair/bed transfer activity did not occur: Refused  Chair/bed transfer assist level: Contact Guard/Touching assist     Locomotion Ambulation   Ambulation assist      Assist level: Minimal Assistance - Patient > 75% Assistive device: Walker-rolling Max distance: 12'   Walk 10 feet activity   Assist     Assist level: Minimal Assistance - Patient > 75% Assistive device: Walker-rolling   Walk 50 feet activity   Assist Walk 50 feet with 2 turns activity did not occur: Safety/medical concerns  Assist level: Minimal Assistance - Patient > 75% Assistive device: Walker-rolling    Walk 150 feet activity   Assist Walk 150 feet activity did not occur: Safety/medical concerns          Walk 10 feet on uneven surface  activity   Assist Walk 10 feet on uneven surfaces activity did not occur: Safety/medical concerns         Wheelchair     Assist Is the patient using a wheelchair?: Yes Type of Wheelchair: Manual    Wheelchair assist level: Maximal Assistance - Patient 25 - 49% Max wheelchair distance: 20    Wheelchair 50 feet with 2 turns activity    Assist        Assist Level: Maximal Assistance - Patient 25 - 49% (per PT documentation)   Wheelchair 150 feet activity     Assist      Assist Level: Total Assistance - Patient < 25% (per PT documentation)   Blood pressure (!) 176/65, pulse 62, temperature 98.6 F (37 C), resp. rate 16, height 5' 7.01" (1.702 m), weight 100.7 kg, SpO2 100 %.  Medical Problem List and Plan: 1. 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  Antithrombotics: -DVT/anticoagulation:  Pharmaceutical: Heparin             -antiplatelet therapy: Aspirin 81 mg daily and Plavix 75 mg day x3 weeks then Plavix alone- d'ced ASA    3. Pain Management: Tylenol as needed  -left shoulder improved although rom still limited  -continue aspercreme for left shoulder -can use muscle rub also   -reviewed stretches HE needs to work on while by himself in room    Subacromial left shoulder injection 1/31  4. Mood and sleep/wake:         Continue ritalin 5mg  BID  to help with motivation/arousal- monitor effect prior to increasing dose if needed              -antipsychotic agents: N/A 5. Neuropsych: This patient is capable of making decisions on his own behalf. 6. Skin/Wound Care: Routine skin checks 7. Fluids/Electrolytes/Nutrition:     -elevated BUN. BMET never  done this week. Recheck bmet today  -encouraging fluids 8.  Diabetes mellitus.  Hemoglobin A1c 9.9.  NovoLog 3 units 3 times daily, Levemir  units  daily.    CBG (last 3)  Recent Labs    05/30/21 0619 05/30/21 1132 05/30/21 1607  GLUCAP 153* 198* 218*    2/3 cbg's falling after steroid injection. Cover with ssi for now 9.  Hypertension.  Lisinopril 40 mg daily, Toprol-XL 50 mg daily, hydralazine 50 mg every 8 hours, Hygroton 25 mg  daily.   Vitals:   05/29/21 2025 05/30/21 1240  BP: (!) 151/62 (!) 176/65  Pulse: (!) 57 62  Resp: 18 16  Temp: 97.7 F (36.5 C) 98.6 F (37 C)  SpO2: 100% 100%       toprol  100mg  qd,   hydralazine qid  - clonidine 0.1mg  bid-   2/3 reduced toprol to 75mg  daily d/t bradycardia, bp/hr stable  2/4: increase clonidine to 0.2 mg BID as BP approaching 176/65 10.  Hyperlipidemia.  continue Lipitor 11.  History of gout.  continue Allopurinol.  Monitor for any gout flareups   13.  Aortic stenosis- OP f/u with cardiology  14. Elevated BUN: give fluids overnight, repeat BMP tomorrow.  15. Bradycardia: decrease toprol to 50mg   LOS: 21 days A FACE TO FACE EVALUATION WAS PERFORMED  P Finnbar Cedillos 05/30/2021, 5:05 PM

## 2021-05-30 NOTE — Progress Notes (Signed)
Speech Language Pathology Daily Session Note  Patient Details  Name: Manuel Chapman MRN: [id number] Date of Birth: 04-06-1946  Today's Date: 05/30/2021 SLP Individual Time: [id number] SLP Individual Time Calculation (min): 30 min  Short Term Goals: Week 4: SLP Short Term Goal 1 (Week 4): STGs=LTGs due to ELOS  Skilled Therapeutic Interventions: Skilled ST treatment focused on cognitive goals. SLP facilitated a functional conversation that focused on emergent awareness and areas of focus with during PT/OT/ST. Patient was able to report importance of consuming liquids via provale cup to "prevent me from getting choked" and with sup A cues acknowledged that without provale cup patient has a tendency to "drink too fast." Pt expressed he was willing to do anything that could help him get better which is why he continues to be agreeable to use it. Patient was also able to identify safety precautions secondary to current physical limitations such as using a RW. Stated "I wouldn't go anywhere without it." Pt appeared overall optimistic about his progress in CIR and hopeful for further improvement. Patient was left in recliner with alarm activated and immediate needs within reach at end of session. Continue per current plan of care.      Pain Pain Assessment Pain Scale: 0-10 Pain Score: 0-No pain  Therapy/Group: Individual Therapy  Francia Verry T Trudee Chirino 05/30/2021, 8:05 AM

## 2021-05-31 LAB — GLUCOSE, CAPILLARY
Glucose-Capillary: 212 mg/dL — ABNORMAL HIGH (ref 70–99)
Glucose-Capillary: 220 mg/dL — ABNORMAL HIGH (ref 70–99)
Glucose-Capillary: 157 mg/dL — ABNORMAL HIGH (ref 70–99)
Glucose-Capillary: 257 mg/dL — ABNORMAL HIGH (ref 70–99)

## 2021-05-31 LAB — BASIC METABOLIC PANEL
Anion gap: 7 (ref 5–15)
BUN: 32 mg/dL — ABNORMAL HIGH (ref 8–23)
CO2: 23 mmol/L (ref 22–32)
Calcium: 8.3 mg/dL — ABNORMAL LOW (ref 8.9–10.3)
Chloride: 106 mmol/L (ref 98–111)
Creatinine, Ser: 0.95 mg/dL (ref 0.61–1.24)
GFR, Estimated: >60 mL/min (ref >60)
Glucose, Bld: 233 mg/dL — ABNORMAL HIGH (ref 70–99)
Potassium: 4.7 mmol/L (ref 3.5–5.1)
Sodium: 136 mmol/L (ref 135–145)

## 2021-05-31 MED ORDER — INSULIN DETEMIR 100 UNIT/ML ~~LOC~~ SOLN
8.0000 [IU] | Freq: Every day | SUBCUTANEOUS | Status: DC
  Administered 2021-05-31: 8 [IU] via SUBCUTANEOUS
  Filled 2021-05-31 (×2): qty 0.08

## 2021-05-31 NOTE — Progress Notes (Signed)
Physical Therapy Session Note  Patient Details  Name: Manuel Chapman MRN: [id number] Date of Birth: June 24, 1945  Today's Date: 05/31/2021 PT Individual Time: 1448-1520 PT Individual Time Calculation (min): 32 min   Short Term Goals: Week 3:  PT Short Term Goal 1 (Week 3): STGs = LTGs  Skilled Therapeutic Interventions/Progress Updates:   Chart reviewed and pt agreeable to therapy. Pt received in bed and no c/o pain. Session focused on safety judgement during ambulation and balance exercises for safe home access. Of note, pt verbalized need for toileting and required Min A to use urinal. Pt initiated session with transfer to [facility] with CGA + RW. For safety awareness, pt tasked with ambulating as far as tolerated with safe return to [place]. Pt was able to ambulate 63ft with RW + close supervision. At 41ft amb, pt verbalized c/o dizziness without lightheadedness that was not abolishing, thus RN assisted to bring WC from starting point and pt was returned to room. Symptoms diminshed after 2 mins rest in chair. VSS. Pt then continued with balance exercises at bedside completing 2 rounds of BLE SLS x 30sec using handrail + 10 heel raises using handrail + 30 sec eyes closed regular stance no handhold + 30 secs closed stance with no handhold. At end of session, pt was left reclined in bed (previously in chair for AM per pt report) with RN present with alarm engaged, nurse call bell and all needs in reach.   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

## 2021-05-31 NOTE — Progress Notes (Signed)
Speech Language Pathology Daily Session Note  Patient Details  Name: Manuel Chapman MRN: [id number] Date of Birth: Aug 09, 1945  Today's Date: 05/31/2021 SLP Individual Time: 0932-1000 SLP Individual Time Calculation (min): 28 min  Short Term Goals: Week 4: SLP Short Term Goal 1 (Week 4): STGs=LTGs due to ELOS  Skilled Therapeutic Interventions: Skilled ST services focused on cognitive skills. Pt demonstrated recall of swallow precautions, use of provale cup mod I. Pt was orientated to all but date. SLP facilitated basic problem solving and error awareness in 4 step ADL picture sequence task, pt required max A fade to mod A verbal cues once SLP cued pt to verbalize steps prior to sequencing. Pt was left in room with chair alarm set and call bell within reach. Recommend to continue skilled ST services.     Pain Pain Assessment Pain Score: 0-No pain  Therapy/Group: Individual Therapy  Sabrie Moritz  [facility] 05/31/2021, 4:59 PM

## 2021-05-31 NOTE — Progress Notes (Signed)
BUN improved- may d/c fluids, discussed with nursing

## 2021-06-01 LAB — GLUCOSE, CAPILLARY
Glucose-Capillary: 192 mg/dL — ABNORMAL HIGH (ref 70–99)
Glucose-Capillary: 185 mg/dL — ABNORMAL HIGH (ref 70–99)
Glucose-Capillary: 244 mg/dL — ABNORMAL HIGH (ref 70–99)

## 2021-06-01 MED ORDER — INSULIN DETEMIR 100 UNIT/ML ~~LOC~~ SOLN
10.0000 [IU] | Freq: Every day | SUBCUTANEOUS | Status: DC
  Administered 2021-06-01 – 2021-06-08 (×8): 10 [IU] via SUBCUTANEOUS
  Filled 2021-06-01 (×11): qty 0.1

## 2021-06-01 MED ORDER — INSULIN DETEMIR 100 UNIT/ML ~~LOC~~ SOLN
11.0000 [IU] | Freq: Every day | SUBCUTANEOUS | Status: DC
  Administered 2021-06-02 – 2021-06-09 (×8): 11 [IU] via SUBCUTANEOUS
  Filled 2021-06-01 (×8): qty 0.11

## 2021-06-01 NOTE — Progress Notes (Signed)
Physical Therapy Session Note  Patient Details  Name: Ronav Furney MRN: [id number] Date of Birth: [birth year]  Today's Date: 06/01/2021 PT Individual Time: 1115-1156 PT Individual Time Calculation (min): 41 min   Short Term Goals: Week 3:  PT Short Term Goal 1 (Week 3): STGs = LTGs  Skilled Therapeutic Interventions/Progress Updates:     Pt received seated in recliner and agrees to therapy. Reports some pain in L shoulder with movement, but says it is improving. Number not provided. PT provides rest breaks as needed to manage pain. Pt performs stand step transfer to [facility] with RW and cues for upright gaze to improve posture and balance. WC transport to gym for time management. Pt performs NMR in parallel bars for balance and L hemibody strengthening. Pt completes x10 alternating high leg lifts with cue to tap thigh to bar, maintaining good posture and utilizing mirror for visual feedback. Pt immediately performs x10 heel raises following leg lifts. Requests seated rest break following due to fatigue. Pt then performs x10 alternating standing hamstring curls with verbal and tactile cues for posture. Prior to rest break pt completes x10 alternating standing hip abduction. Pt performs side stepping to the L and R with L upper extremity support, with mirror for visual feedback. Pt has difficulty maintaining torso and hips oriented toward wall, instead turning to ambulate forward, and is somewhat resistant to tactile and verbal cueing to correct. WC transport back to room. Pt ambulates x15' back to recliner with RW and close supervision. 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

## 2021-06-01 NOTE — NC FL2 (Signed)
[place] MEDICAID FL2 LEVEL OF CARE SCREENING TOOL     IDENTIFICATION  Patient Name: Manuel Chapman Birthdate: 30-Jan-1946 Sex: male Admission Date (Current Location): 05/09/2021  [facility] and [place] Number:  Producer, television/film/video and Address:  [facility] [place], [address], [place], [place] [address]      Provider Number: [id number]  Attending Physician Name and Address:  Ranelle Oyster, MD  Relative Name and Phone Number:  Harmon Dun (dtr) #[phone]    Current Level of Care: Hospital Recommended Level of Care: Skilled Nursing Facility Prior Approval Number:    Date Approved/Denied:   PASRR Number: [id number]  Discharge Plan: SNF    Current Diagnoses: Patient Active Problem List   Diagnosis Date Noted   Tendonitis of left rotator cuff    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   Acute CVA (cerebrovascular accident) (HCC) 05/05/2021   Pressure injury of skin 11/30/2019   Hyperglycemia    Hypokalemia    Acute blood loss anemia    ICH (intracerebral hemorrhage) (HCC) 11/29/2019   Gout 05/10/2015   HTN (hypertension) 05/10/2015   Dyslipidemia 05/10/2015   Diabetes (HCC) 04/10/2015    Orientation RESPIRATION BLADDER Height & Weight     Self, Time, Place, Situation  Normal Incontinent Weight: 222 lb 0.1 oz (100.7 kg) Height:  5' 7.01" (170.2 cm)  BEHAVIORAL SYMPTOMS/MOOD NEUROLOGICAL BOWEL NUTRITION STATUS      Incontinent Diet (modified carb diet with thin liquids)  AMBULATORY STATUS COMMUNICATION OF NEEDS Skin   Limited Assist Verbally Normal                       Personal Care Assistance Level of Assistance  Bathing, Dressing Bathing Assistance: Limited assistance Feeding assistance: Limited assistance Dressing Assistance: Limited assistance     Functional Limitations Info    Sight Info: Adequate Hearing Info: Adequate      SPECIAL CARE FACTORS FREQUENCY  PT (By licensed PT), OT (By licensed  OT), Speech therapy     PT Frequency: 5xs per week OT Frequency: 5xs per week     Speech Therapy Frequency: 5xs per week      Contractures Contractures Info: Not present    Additional Factors Info  Code Status, Insulin Sliding Scale Code Status Info: Full Allergies Info: NKA   Insulin Sliding Scale Info: See d/c summary Isolation Precautions Info: None     Current Medications (06/01/2021):  This is the current hospital active medication list Current Facility-Administered Medications  Medication Dose Route Frequency Provider Last Rate Last Admin   acetaminophen (TYLENOL) tablet 650 mg  650 mg Oral Q4H PRN Angiulli, Mcarthur Rossetti, PA-C       Or   acetaminophen (TYLENOL) 160 MG/5ML solution 650 mg  650 mg Per Tube Q4H PRN Angiulli, Mcarthur Rossetti, PA-C       Or   acetaminophen (TYLENOL) suppository 650 mg  650 mg Rectal Q4H PRN Angiulli, Mcarthur Rossetti, PA-C       allopurinol (ZYLOPRIM) tablet 300 mg  300 mg Oral Daily Charlton Amor, PA-C   300 mg at 06/01/21 1027   atorvastatin (LIPITOR) tablet 80 mg  80 mg Oral Daily Charlton Amor, PA-C   80 mg at 06/01/21 2536   chlorthalidone (HYGROTON) tablet 25 mg  25 mg Oral Daily Charlton Amor, PA-C   25 mg at 06/01/21 6440   cloNIDine (CATAPRES) tablet 0.2 mg  0.2 mg Oral BID Raulkar, Drema Pry, MD  0.2 mg at 06/01/21 0347   clopidogrel (PLAVIX) tablet 75 mg  75 mg Oral Daily Charlton Amor, PA-C   75 mg at 06/01/21 4259   heparin injection 5,000 Units  5,000 Units Subcutaneous Q8H Charlton Amor, PA-C   5,000 Units at 06/01/21 5638   hydrALAZINE (APRESOLINE) tablet 50 mg  50 mg Oral QID Ranelle Oyster, MD   50 mg at 06/01/21 7564   insulin aspart (novoLOG) injection 0-15 Units  0-15 Units Subcutaneous TID WC Charlton Amor, PA-C   3 Units at 06/01/21 0815   insulin aspart (novoLOG) injection 3 Units  3 Units Subcutaneous TID WC AngiulliMcarthur Rossetti, PA-C   3 Units at 06/01/21 0815   insulin detemir (LEVEMIR) injection 8 Units  8  Units Subcutaneous Daily Horton Chin, MD   8 Units at 05/31/21 2052   insulin detemir (LEVEMIR) injection 9 Units  9 Units Subcutaneous Daily Ranelle Oyster, MD   9 Units at 06/01/21 0814   lisinopril (ZESTRIL) tablet 40 mg  40 mg Oral Daily Charlton Amor, PA-C   40 mg at 06/01/21 3329   methylphenidate (RITALIN) tablet 5 mg  5 mg Oral BID WC Ranelle Oyster, MD   5 mg at 06/01/21 5188   metoprolol succinate (TOPROL-XL) 24 hr tablet 50 mg  50 mg Oral Daily Raulkar, Drema Pry, MD   50 mg at 06/01/21 4166   multivitamin with minerals tablet 1 tablet  1 tablet Oral Daily Charlton Amor, PA-C   1 tablet at 06/01/21 0630   Muscle Rub CREA   Topical BID Kai Levins, RPH   Given at 06/01/21 0815   senna-docusate (Senokot-S) tablet 1 tablet  1 tablet Oral QHS PRN Charlton Amor, PA-C         Discharge Medications: Please see discharge summary for a list of discharge medications.  Relevant Imaging Results:  Relevant Lab Results:   Additional Information SS# [id number]  Gretchen Short, LCSW

## 2021-06-01 NOTE — Progress Notes (Signed)
Occupational Therapy Session Note  Patient Details  Name: Manuel Chapman MRN: [id number] Date of Birth: 30-Mar-1946  Today's Date: 06/01/2021 OT Individual Time: 7395-8441 OT Individual Time Calculation (min): 42 min    Short Term Goals: Week 3:  OT Short Term Goal 1 (Week 3): LTG=STG 2/2 ELOS  Skilled Therapeutic Interventions/Progress Updates:    Pt greeted semi-reclined in bed asleep. OT able to wake pt and he was agreeable to get out of bed for BADL tasks. Pt continued to decline to shower. Agreeable to bathing/dressing at EOB. Pt incontinent of urine and unaware. Sit<>stand with increased time, but close supervision. In standing, pt able to wash peri-area, but needed OT assist to wash buttocks and don clean brief. Pt can use reacher to thread pant legs, then pull up pants in standing with close supervision. Pt needed rest breaks in between stands due to fatigue. Stand-pivot to recliner w/ RW and close supervision. UB bathing/dressing completed from recliner with set-up/supervision assistance. Pt reported improved L shoulder pain. Addressed L shoulder with gentle ROM with slightly improved shoulder forward flexion. Pt left seated in recliner with chair alarm pad on, call bell in reach, and needs met.   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

## 2021-06-01 NOTE — Progress Notes (Signed)
Speech Language Pathology Daily Session Note  Patient Details  Name: Ephriam Turman MRN: [id number] Date of Birth: 1945/12/15  Today's Date: 06/01/2021 SLP Individual Time: 1205-1230 SLP Individual Time Calculation (min): 25 min  Short Term Goals: Week 4: SLP Short Term Goal 1 (Week 4): STGs=LTGs due to ELOS  Skilled Therapeutic Interventions: Skilled treatment session focused on dysphagia and cognitive goals. SLP facilitated session by providing skilled observation with lunch meal of regular textures with thin liquids. Patient consumed meal without overt s/s of aspiration despite large, sequential sips of thin liquids via a regular cup.  Recommend patient continue current diet. Patient demonstrated selective attention to self-feeding in a moderately distracting environment for 25 minutes with supervision level verbal cues for redirection due to intermittent verbosity. Patient left upright in recliner with alarm on and all needs within reach. Continue with current plan of care.      Pain No/Denies Pain   Therapy/Group: Individual Therapy  Jon Lall 06/01/2021, 12:54 PM

## 2021-06-01 NOTE — Progress Notes (Addendum)
PROGRESS NOTE   Subjective/Complaints: No new issues. Sleeping when I came in  ROS: Patient denies fever, rash, sore throat, blurred vision, dizziness, nausea, vomiting, diarrhea, cough, shortness of breath or chest pain,  back/neck pain, headache, or mood change.    Objective:   No results found. No results for input(s): WBC, HGB, HCT, PLT in the last 72 hours.  Recent Labs    05/29/21 1302 05/31/21 0508  NA 137 136  K 5.0 4.7  CL 104 106  CO2 18* 23  GLUCOSE 203* 233*  BUN 38* 32*  CREATININE 1.15 0.95  CALCIUM 8.8* 8.3*      Intake/Output Summary (Last 24 hours) at 06/01/2021 1142 Last data filed at 06/01/2021 0700 Gross per 24 hour  Intake 720 ml  Output 400 ml  Net 320 ml         Physical Exam: Vital Signs Blood pressure (!) 184/76, pulse 60, temperature 98 F (36.7 C), temperature source Oral, resp. rate 17, height 5' 7.01" (1.702 m), weight 100.7 kg, SpO2 100 %.   Constitutional: No distress . Vital signs reviewed. HEENT: NCAT, EOMI, oral membranes moist Neck: supple Cardiovascular: RRR without murmur. No JVD    Respiratory/Chest: CTA Bilaterally without wheezes or rales. Normal effort    GI/Abdomen: BS +, non-tender, non-distended Ext: no clubbing, cyanosis, or edema Psych: pleasant and cooperative  Skin: No evidence of breakdown, no evidence of rash  Neurologic: fairly alert.  Follows basic commands with some delay. Cranial nerves II through XII intact, motor strength is 5/5 in right deltoid, bicep, tricep, grip,  and bilateral hip flexor, knee extensors, ankle dorsiflexor and plantar flexor. Prox left upper limited by pain--stable exam Sensory exam normal for light touch and pain in all 4 limbs. No limb ataxia or cerebellar signs. No abnormal tone appreciated.   Musculoskeletal:left shoulder pain   Assessment/Plan: 1. Functional deficits which require 3+ hours per day of interdisciplinary  therapy in a comprehensive inpatient rehab setting. Physiatrist is providing close team supervision and 24 hour management of active medical problems listed below. Physiatrist and rehab team continue to assess barriers to discharge/monitor patient progress toward functional and medical goals  Care Tool:  Bathing    Body parts bathed by patient: Left arm, Chest, Abdomen, Front perineal area, Right upper leg, Face, Right arm, Left lower leg, Right lower leg, Left upper leg   Body parts bathed by helper: Right arm, Buttocks, Right lower leg, Left lower leg     Bathing assist Assist Level: Minimal Assistance - Patient > 75%     Upper Body Dressing/Undressing Upper body dressing   What is the patient wearing?: Pull over shirt    Upper body assist Assist Level: Set up assist    Lower Body Dressing/Undressing Lower body dressing      What is the patient wearing?: Pants     Lower body assist Assist for lower body dressing: Minimal Assistance - Patient > 75% Assistive Device Comment: reacher   Toileting Toileting Toileting Activity did not occur Press photographer and hygiene only): Refused  Toileting assist Assist for toileting: Moderate Assistance - Patient 50 - 74%     Transfers Chair/bed transfer  Transfers assist  Chair/bed transfer activity did not occur: Refused  Chair/bed transfer assist level: Contact Guard/Touching assist     Locomotion Ambulation   Ambulation assist      Assist level: Minimal Assistance - Patient > 75% Assistive device: Walker-rolling Max distance: 12'   Walk 10 feet activity   Assist     Assist level: Minimal Assistance - Patient > 75% Assistive device: Walker-rolling   Walk 50 feet activity   Assist Walk 50 feet with 2 turns activity did not occur: Safety/medical concerns  Assist level: Minimal Assistance - Patient > 75% Assistive device: Walker-rolling    Walk 150 feet activity   Assist Walk 150 feet activity did  not occur: Safety/medical concerns         Walk 10 feet on uneven surface  activity   Assist Walk 10 feet on uneven surfaces activity did not occur: Safety/medical concerns         Wheelchair     Assist Is the patient using a wheelchair?: Yes Type of Wheelchair: Manual    Wheelchair assist level: Maximal Assistance - Patient 25 - 49% Max wheelchair distance: 20    Wheelchair 50 feet with 2 turns activity    Assist        Assist Level: Maximal Assistance - Patient 25 - 49% (per PT documentation)   Wheelchair 150 feet activity     Assist      Assist Level: Total Assistance - Patient < 25% (per PT documentation)   Blood pressure (!) 184/76, pulse 60, temperature 98 F (36.7 C), temperature source Oral, resp. rate 17, height 5' 7.01" (1.702 m), weight 100.7 kg, SpO2 100 %.  Medical Problem List and Plan: 1. 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  Antithrombotics: -DVT/anticoagulation:  Pharmaceutical: Heparin             -antiplatelet therapy: Aspirin 81 mg daily and Plavix 75 mg day x3 weeks then Plavix alone- d'ced ASA    3. Pain Management: Tylenol as needed  -left shoulder improved although rom still limited  -continue aspercreme for left shoulder -can use muscle rub also   -reviewed stretches HE needs to work on while by himself in room    Subacromial left shoulder injection 1/31 with some benefit 4. Mood and sleep/wake:         Continue ritalin 5mg  BID  to help with motivation/arousal- monitor effect prior to increasing dose if needed              -antipsychotic agents: N/A 5. Neuropsych: This patient is capable of making decisions on his own behalf. 6. Skin/Wound Care:  Routine skin checks 7. Fluids/Electrolytes/Nutrition:     -elevated BUN. BMET never done this week. Recheck bmet today  -encouraging fluids 8.  Diabetes mellitus.  Hemoglobin A1c 9.9.  NovoLog 3 units 3 times daily, Levemir  units  daily.    CBG (last 3)  Recent Labs    05/31/21 1604 05/31/21 2050 06/01/21 0735  GLUCAP 157* 257* 192*    2/6 cbg's falling after steroid injection but not back to baseline   -increase levemir  to 11u qam and 10u qhs 9.  Hypertension.  Lisinopril 40 mg daily, Toprol-XL 50 mg daily, hydralazine 50 mg every 8 hours, Hygroton 25 mg daily.   Vitals:   05/31/21 2020 06/01/21 0622  BP: (!) 122/50 (!) 184/76  Pulse: 62 60  Resp:    Temp: 97.9 F (36.6 C) 98 F (36.7 C)  SpO2: 100% 100%       toprol  100mg  qd,   hydralazine qid  - clonidine 0.1mg  bid-     reduced toprol to 50mg  daily d/t bradycardia  2/4: increased clonidine to 0.2 mg BID--observe 10.  Hyperlipidemia.  continue Lipitor 11.  History of gout.  continue Allopurinol.  Monitor for any gout flareups   13.  Aortic stenosis- OP f/u with cardiology     LOS: 23 days A FACE TO FACE EVALUATION WAS PERFORMED  06/01/2021, 11:42 AM

## 2021-06-02 LAB — BASIC METABOLIC PANEL
Anion gap: 5 (ref 5–15)
BUN: 37 mg/dL — ABNORMAL HIGH (ref 8–23)
CO2: 25 mmol/L (ref 22–32)
Calcium: 8.7 mg/dL — ABNORMAL LOW (ref 8.9–10.3)
Chloride: 105 mmol/L (ref 98–111)
Creatinine, Ser: 1.04 mg/dL (ref 0.61–1.24)
GFR, Estimated: >60 mL/min (ref >60)
Glucose, Bld: 161 mg/dL — ABNORMAL HIGH (ref 70–99)
Potassium: 5.2 mmol/L — ABNORMAL HIGH (ref 3.5–5.1)
Sodium: 135 mmol/L (ref 135–145)

## 2021-06-02 LAB — GLUCOSE, CAPILLARY
Glucose-Capillary: 148 mg/dL — ABNORMAL HIGH (ref 70–99)
Glucose-Capillary: 201 mg/dL — ABNORMAL HIGH (ref 70–99)
Glucose-Capillary: 179 mg/dL — ABNORMAL HIGH (ref 70–99)
Glucose-Capillary: 174 mg/dL — ABNORMAL HIGH (ref 70–99)

## 2021-06-02 MED ORDER — SODIUM CHLORIDE 0.45 % IV SOLN: INTRAVENOUS | Status: DC

## 2021-06-02 NOTE — Progress Notes (Signed)
Patient ID: Manuel Chapman, male   DOB: 22-May-1945, 76 y.o.   MRN: [id number]  SW met with pt in room to provide updates from team conference, and informed on waiting to hear back about assistance from DSS placement SW.   *SW returned phone call and left message for pt SIL-[name] to inform will meet with pt tomorrow to call his pension plan about them sending his documentation to speed up the process for his LTC Medicaid.   Loralee Pacas, MSW, [place] Office: [phone] Cell: [phone] Fax: [phone]

## 2021-06-02 NOTE — Plan of Care (Deleted)
Patient's plan of care adjusted to 15/7 after speaking with care team and discussed with MD in team conference as patient currently unable to tolerate current therapy schedule with OT, PT, and SLP. ° °

## 2021-06-02 NOTE — Progress Notes (Signed)
Speech Language Pathology Daily Session Note  Patient Details  Name: Manuel Chapman MRN: [id number] Date of Birth: 1945/08/13  Today's Date: 06/02/2021 SLP Individual Time: 1100-1125 SLP Individual Time Calculation (min): 25 min  Short Term Goals: Week 4: SLP Short Term Goal 1 (Week 4): STGs=LTGs due to ELOS  Skilled Therapeutic Interventions: Skilled treatment session focused on dysphagia and cognitive goals. SLP facilitated session by providing skilled observation with trials of thin liquids via cup. Patient consumed large, sequential sips of thin liquids without overt s/s of aspiration. Therefore, recommend patient can now consume liquids via cup but continue no straw. Patient with improved recall of daily information but continues to perseverate on topics but is more easily redirected with overall supervision level verbal cues. Patient left upright in recliner with alarm on and all needs within reach. Continue with current plan of care.      Pain Pain Assessment Pain Scale: 0-10 Pain Score: 0-No pain  Therapy/Group: Individual Therapy  Manuel [place] 06/02/2021, 12:02 PM

## 2021-06-02 NOTE — Progress Notes (Signed)
Occupational Therapy Weekly Progress Note  Patient Details  Name: Manuel Chapman MRN: [id number] Date of Birth: 01-Jun-1945  Beginning of progress report period: May 10, 2021 End of progress report period: June 02, 2021  Today's Date: 06/02/2021 OT Individual Time: 9935-7017 OT Individual Time Calculation (min): 43 min    Short term goals not set due to estimated length of stay. Pt continues to need assistance for toileting tasks and is incontinent without awareness. Pt is moving well at a CGA/supervision level and has demonstrated improved activity tolerance. At baseline, pt needed assistance for self-care tasks and I do not anticipate him getting past a min A level for toileting tasks due to body habitus and difficulty reaching. Incontinence is also a huge issue with being independent with self-care as pt is extremely unaware and would be at a high risk for skin breakdown if he were to be home without assistance. OT continues to recommend SNF for continued care and rehabilitation.   Patient continues to demonstrate the following deficits: muscle weakness, decreased cardiorespiratoy endurance, decreased initiation, decreased awareness, and decreased safety awareness, and decreased standing balance, decreased postural control, and decreased balance strategies and therefore will continue to benefit from skilled OT intervention to enhance overall performance with BADL and Reduce care partner burden.  Patient progressing toward long term goals..  Continue plan of care.  OT Short Term Goals Week 4:  OT Short Term Goal 1 (Week 4): LTG=STG 2/2 ELOS  Skilled Therapeutic Interventions/Progress Updates:    Pt greeted seated in recliner and agreeable to OT treatment session. OT encouraged pt to shower today due to odor and frequent incontinence. Pt stated he did shower at home, but did not want to shower here. Pt agreeable to was up at the sink. Pt ambulated to the sink with RW and close  supervision. Pt tolerated standing for 5 minutes with close supervision while washing peri-area, OT assisted with washing buttocks due to body habitus and difficulty reaching. Pt declined to use AE To wash buttocks. Pt had been incontinent of urine and soaked through clothing. OT issued LH sponge, reacher, and sock-aid to increase access to lower body. Pt able to wash lower legs and feet with LH sponge and set-up A. Pt then recalled how to thread pant legs with  supervision to don pants. Pt needed demonstration again for using sock-aid and needed min A to don socks even with AE. Pt ambulated back to recliner at end of session with RW and close supervision. Pt left with chair alarm on, call bell in reach, and needs met.   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

## 2021-06-02 NOTE — Progress Notes (Signed)
Physical Therapy Session Note  Patient Details  Name: Manuel Chapman MRN: [id number] Date of Birth: 1945-11-29  Today's Date: 06/02/2021 PT Individual Time: 0902-0944 PT Individual Time Calculation (min): 42 min   Short Term Goals: Week 3:  PT Short Term Goal 1 (Week 3): STGs = LTGs  Skilled Therapeutic Interventions/Progress Updates:     Pt received seated in recliner and agrees to therapy. No complaint of pain. Pt performs sit to stand with RW and cues for initiation. PT notes that pt has been incontinent of bowel and cues pt to ambulate to toilet with cues for RW management. Pt is partially continent of additional bowel in toilet. Following, pt stands with RW and remains standing for ~5 minutes as PT performs pericare, with cues for posture. Pt takes seated rest break prior to additional stand and additional pericare. Pt ambulates to WC with RW. Pt performs forward and backward ambulation to work on dynamic balance, 2x15' in each direction, with cues for upright gaze to improve balance and body mechanics. Pt left seated in recliner with alarm intact and all needs within reach.  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

## 2021-06-02 NOTE — Progress Notes (Signed)
PROGRESS NOTE   Subjective/Complaints: Pt up in chair. No ne complaints. Mood about the same has been participating better in therapies  ROS: Limited due to cognitive/behavioral    Objective:   No results found. No results for input(s): WBC, HGB, HCT, PLT in the last 72 hours.  Recent Labs    05/31/21 0508 06/02/21 0507  NA 136 135  K 4.7 5.2*  CL 106 105  CO2 23 25  GLUCOSE 233* 161*  BUN 32* 37*  CREATININE 0.95 1.04  CALCIUM 8.3* 8.7*      Intake/Output Summary (Last 24 hours) at 06/02/2021 1142 Last data filed at 06/02/2021 0700 Gross per 24 hour  Intake 720 ml  Output 800 ml  Net -80 ml         Physical Exam: Vital Signs Blood pressure (!) 162/59, pulse (!) 58, temperature 98 F (36.7 C), resp. rate 18, height 5' 7.01" (1.702 m), weight 100.7 kg, SpO2 100 %.   Constitutional: No distress . Vital signs reviewed. HEENT: NCAT, EOMI, oral membranes moist Neck: supple Cardiovascular: RRR without murmur. No JVD    Respiratory/Chest: CTA Bilaterally without wheezes or rales. Normal effort    GI/Abdomen: BS +, non-tender, non-distended Ext: no clubbing, cyanosis, or edema Psych: pleasant and cooperative  Skin: No evidence of breakdown, no evidence of rash  Neurologic: fairly alert.  Follows basic commands with some delay. Cranial nerves II through XII intact, motor strength is 5/5 in right deltoid, bicep, tricep, grip,  and bilateral hip flexor, knee extensors, ankle dorsiflexor and plantar flexor. Prox left upper limited by pain--stable exam Sensory exam normal for light touch and pain in all 4 limbs. No limb ataxia or cerebellar signs. No abnormal tone appreciated.   Musculoskeletal:left shoulder tender, limited with ROM   Assessment/Plan: 1. Functional deficits which require 3+ hours per day of interdisciplinary therapy in a comprehensive inpatient rehab setting. Physiatrist is providing close team  supervision and 24 hour management of active medical problems listed below. Physiatrist and rehab team continue to assess barriers to discharge/monitor patient progress toward functional and medical goals  Care Tool:  Bathing    Body parts bathed by patient: Left arm, Chest, Abdomen, Front perineal area, Right upper leg, Face, Right arm, Left lower leg, Right lower leg, Left upper leg   Body parts bathed by helper: Right arm, Buttocks, Right lower leg, Left lower leg     Bathing assist Assist Level: Minimal Assistance - Patient > 75%     Upper Body Dressing/Undressing Upper body dressing   What is the patient wearing?: Pull over shirt    Upper body assist Assist Level: Set up assist    Lower Body Dressing/Undressing Lower body dressing      What is the patient wearing?: Pants     Lower body assist Assist for lower body dressing: Minimal Assistance - Patient > 75% Assistive Device Comment: reacher   Toileting Toileting Toileting Activity did not occur Press photographer and hygiene only): Refused  Toileting assist Assist for toileting: Moderate Assistance - Patient 50 - 74%     Transfers Chair/bed transfer  Transfers assist  Chair/bed transfer activity did not occur: Refused  Chair/bed  transfer assist level: Contact Guard/Touching assist     Locomotion Ambulation   Ambulation assist      Assist level: Minimal Assistance - Patient > 75% Assistive device: Walker-rolling Max distance: 12'   Walk 10 feet activity   Assist     Assist level: Minimal Assistance - Patient > 75% Assistive device: Walker-rolling   Walk 50 feet activity   Assist Walk 50 feet with 2 turns activity did not occur: Safety/medical concerns  Assist level: Minimal Assistance - Patient > 75% Assistive device: Walker-rolling    Walk 150 feet activity   Assist Walk 150 feet activity did not occur: Safety/medical concerns         Walk 10 feet on uneven surface   activity   Assist Walk 10 feet on uneven surfaces activity did not occur: Safety/medical concerns         Wheelchair     Assist Is the patient using a wheelchair?: Yes Type of Wheelchair: Manual    Wheelchair assist level: Maximal Assistance - Patient 25 - 49% Max wheelchair distance: 20    Wheelchair 50 feet with 2 turns activity    Assist        Assist Level: Maximal Assistance - Patient 25 - 49% (per PT documentation)   Wheelchair 150 feet activity     Assist      Assist Level: Total Assistance - Patient < 25% (per PT documentation)   Blood pressure (!) 162/59, pulse (!) 58, temperature 98 F (36.7 C), resp. rate 18, height 5' 7.01" (1.702 m), weight 100.7 kg, SpO2 100 %.  Medical Problem List and Plan: 1. 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 Interdisciplinary team conference today to discuss goals, barriers to discharge, and dc planning.     2.  Antithrombotics: -DVT/anticoagulation:  Pharmaceutical: Heparin             -antiplatelet therapy: Aspirin 81 mg daily and Plavix 75 mg day x3 weeks then Plavix alone- d'ced ASA    3. Pain Management: Tylenol as needed  -left shoulder improved although rom still limited  -continue aspercreme for left shoulder -can use muscle rub also   -reviewed stretches HE needs to work on while by himself in room    Subacromial left shoulder injection 1/31 with some benefit 4. Mood and sleep/wake:         Continue ritalin 5mg  BID  to help with motivation/arousal- monitor effect prior to increasing dose if needed              -antipsychotic agents: N/A 5. Neuropsych: This patient is capable of making decisions on his own behalf. 6.  Skin/Wound Care: Routine skin checks 7. Fluids/Electrolytes/Nutrition:     -further elevation of BUN today -increase IVF 8.  Diabetes mellitus.  Hemoglobin A1c 9.9.  NovoLog 3 units 3 times daily, Levemir  units  daily.    CBG (last 3)  Recent Labs    06/01/21 1649 06/02/21 0540 06/02/21 1136  GLUCAP 244* 148* 201*    2/6 cbg's falling after steroid injection but not back to baseline   -increased levemir to 11u qam  and 10u qhs--obsv 2/7 9.  Hypertension.  Lisinopril 40 mg daily, Toprol-XL 50 mg daily, hydralazine 50 mg every 8 hours, Hygroton 25 mg daily.   Vitals:   06/01/21 1307 06/01/21 2038  BP: (!) 143/56 (!) 162/59  Pulse: (!) 54 (!) 58  Resp: 16 18  Temp: 98 F (36.7 C) 98 F (36.7 C)  SpO2: 100% 100%       toprol  100mg  qd,   hydralazine qid  - clonidine 0.1mg  bid-     reduced toprol to 50mg  daily d/t bradycardia  2/4: increased clonidine to 0.2 mg BID--observe 10.  Hyperlipidemia.  continue Lipitor 11.  History of gout.  continue Allopurinol.  Monitor for any gout flareups   13.  Aortic stenosis- OP f/u with cardiology     LOS: 24 days A FACE TO FACE EVALUATION WAS PERFORMED  Ranelle Oyster 06/02/2021, 11:42 AM

## 2021-06-02 NOTE — Patient Care Conference (Signed)
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 Faith Rogue Social Worker Present: Cecile Sheerer, LCSWA Nurse Present: Kennyth Arnold, RN PT Present: Malachi Pro, PT OT Present: Kearney Hard, OT SLP Present: Eilene Ghazi, SLP PPS Coordinator present : Fae Pippin, SLP     Current Status/Progress Goal Weekly Team Focus  Bowel/Bladder   incontinent b/b  Patient will regain continence of both bowel and bladder  q 2 hour toileting and prn   Swallow/Nutrition/ Hydration   regular textures, thin liquids, sup A, Improved tolerance of thin liquids without use of Provale Cup  Mod I  use of safe swallowing strategies   ADL's   Min A LB ADLs, Min A toileting, activtity tolerance supervision UB ADLs  CGA  dc planning, self-care retraining, education, safety awareness   Mobility   supervision all mobility, ambulating up to 500' with RW  Supervision  continued L hemibody NMR, balance, endurance   Communication   mod I  mod I  at baseline   Safety/Cognition/ Behavioral Observations  min-to-mod A  min A  attention, awareness, problem solving, recall   Pain   no pain reported  <3  assess pain q 4 hr and prn   Skin   CDI  Patient will remain free from skin breakdown  assess skin q shift and prn     Discharge Planning:  Pt will d/c to SNF.  SNF location pending. Pt declined by insurance for placement even after appeal at federal level. APS referral submitted, however, not accepted. Pt was assigned a placement SW instead. [facility] SW working on SNF placement at this time. LTC Medicaid pending pension income and family waiting for this to arrive in the mail.   Team Discussion: Injection to shoulder last week. Participating more since last week, q-day helped. Starting IVF. Patient is depressed, but doesn't won't to talk. States, "has lost his get up and go". Continent/incontinent B/B.   Patient on target to meet rehab goals: no, is participating more with therapy but not progressing. Can't reach behind for peri-care. Continue to focus on min/mod assist goals.  *See Care Plan and progress notes for long and short-term goals.   Revisions to Treatment Plan:  Adjusting medications   Teaching Needs: Family education, medication management, safety awareness, transfer/gait training, etc.  Current Barriers to Discharge: Decreased caregiver support, Incontinence, Weight, Medication compliance, and Behavior  Possible Resolutions to Barriers: Family education Time toileting SNF placement     Medical Summary Current Status: has made some functional gains with increased participation. left shoulder still tender but improved. prerenal azotemia- on IVF  Barriers to Discharge: Medical stability   Possible Resolutions to [employer] Focus: daily assessment of labs and pt data, pain control.   Continued Need for Acute Rehabilitation Level of Care: The patient requires daily medical management by a  physician with specialized training in physical medicine and rehabilitation for the following reasons: Direction of a multidisciplinary physical rehabilitation program to maximize functional independence : Yes Medical management of patient stability for increased activity during participation in an intensive rehabilitation  regime.: Yes Analysis of laboratory values and/or radiology reports with any subsequent need for medication adjustment and/or medical intervention. : Yes   I attest that I was present, lead the team conference, and concur with the assessment and plan of the team.   Tennis Must 06/02/2021, 1:53 PM

## 2021-06-03 LAB — GLUCOSE, CAPILLARY
Glucose-Capillary: 113 mg/dL — ABNORMAL HIGH (ref 70–99)
Glucose-Capillary: 208 mg/dL — ABNORMAL HIGH (ref 70–99)
Glucose-Capillary: 157 mg/dL — ABNORMAL HIGH (ref 70–99)

## 2021-06-03 LAB — BASIC METABOLIC PANEL
Anion gap: 8 (ref 5–15)
BUN: 31 mg/dL — ABNORMAL HIGH (ref 8–23)
CO2: 23 mmol/L (ref 22–32)
Calcium: 8.7 mg/dL — ABNORMAL LOW (ref 8.9–10.3)
Chloride: 106 mmol/L (ref 98–111)
Creatinine, Ser: 1 mg/dL (ref 0.61–1.24)
GFR, Estimated: >60 mL/min (ref >60)
Glucose, Bld: 109 mg/dL — ABNORMAL HIGH (ref 70–99)
Potassium: 5 mmol/L (ref 3.5–5.1)
Sodium: 137 mmol/L (ref 135–145)

## 2021-06-03 NOTE — Progress Notes (Signed)
IV infusing in lower LFA at 75ml throughout the night without difficulty noted. Tolerated well without s/s of infiltration, redness, swelling at IV site.

## 2021-06-03 NOTE — Progress Notes (Signed)
Speech Language Pathology Discharge Summary  Patient Details  Name: Manuel Chapman MRN: [id number] Date of Birth: 1945/05/02  Today's Date: 06/03/2021 SLP Individual Time: 1205-1230 SLP Individual Time Calculation (min): 25 min   Skilled Therapeutic Interventions: Skilled treatment session focused on dysphagia and cognitive goals. SLP facilitated session by providing skilled observation with lunch meal of regular textures with thin liquids. Patient consumed meal without overt s/s of aspiration and was overall Mod I for use of swallowing compensatory strategies. Patient demonstrated selective attention to self-feeding in a mildly distracting environment for 20 minutes with Mod I. Patient also recalled events from previous therapy sessions independently. Suspect patient is at his baseline level of cognitive and swallowing function, therefore, patient will be discharged from skilled SLP intervention. Patient left upright in recliner with alarm on and all needs within reach.   Patient has met 7 of 7 long term goals.  Patient to discharge at overall Supervision;Modified Independent level.   Reasons goals not met: N/A  Clinical Impression/Discharge Summary: Patient has made functional gains and has met 7 of 7 LTGs this admission. Currently, patient is consuming regular textures with thin liquids via cup with minimal overt s/s of aspiration and overall Mod I for use of swallowing compensatory strategies. Patient is also at his baseline level of cognitive functioning and requires overall supervision-Min A verbal cues to complete functional and familiar tasks safely in regards to problem solving, selective attention, emergent awareness and recall. Suspect patient is at his baseline level of cognitive and swallowing function, therefore, patient will be discharged from skilled SLP intervention. Patient verbalized understanding and agreement. Patient educated is complete and recommend patient have 24 hour  supervision at discharge as patient has a h/o cognitive deficits and required assistance with IADLs at baseline.   Care Partner:  Caregiver Able to Provide Assistance: Yes  Type of Caregiver Assistance: Physical;Cognitive  Recommendation:  24 hour supervision/assistance      Equipment: N/A   Reasons for discharge: Treatment goals met   Patient/Family Agrees with Progress Made and Goals Achieved: Yes    Deetra Booton 06/03/2021, 1:28 PM

## 2021-06-03 NOTE — Progress Notes (Signed)
Occupational Therapy Note  Patient Details  Name: Kesean Serviss MRN: [id number] Date of Birth: [birth year]  Today's Date: 06/03/2021 OT Missed Time: 30 Minutes Missed Time Reason: Patient unwilling/refused to participate without medical reason;Patient fatigue  Pt received in bed asleep. When aroused and provided with options for tx, pt states, "nah, me and the PT just went down to the canteen and walked. Im tired." Provided gentle education, however pt continues to refuse missing 30 min of skilled OT d/t fatigue and refusal to participate. Will follow up per POC.   Elenore Paddy Taneil Lazarus 06/03/2021, 3:37 PM

## 2021-06-03 NOTE — Progress Notes (Signed)
Physical Therapy Session Note  Patient Details  Name: Manuel Chapman MRN: [id number] Date of Birth: [birth year]  Today's Date: 06/03/2021 PT Individual Time: 9476-5465 PT Individual Time Calculation (min): 56 min   Short Term Goals: Week 4:     Skilled Therapeutic Interventions/Progress Updates:     Pt received seated in recliner and agrees to therapy. No complaint of pain. Sit to stand with RW and cues for initiation. Pt ambulates to restroom with RW and cues for navigation and sequencing. Pt continent of bowel in toilet though noted to have been incontinent of bladder in brief. Following toileting, pt stands to RW with minA from low toilet and remains standing as PT performs pericare   Wc transport outside for time management. Pt performs ambulation over unlevel and varying surfaces for dynamic gait training and community reintegration. Pt ambulates x120' x2 with seated rest break, requiring CGA for safety and consistent cues to increase upright gaze for posture and balance and increasing gait speed to decrease risk for falls, through pt does not heed PT cues.  WC transport back to room. Stand step transfer to bed with RW and cues for positioning. Left supine with alarm intact and all needs within reach.  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 Restrictions Weight Bearing Restrictions: No    Therapy/Group: Individual Therapy  [name], PT, DPT 06/03/2021, 3:42 PM

## 2021-06-03 NOTE — Progress Notes (Signed)
Occupational Therapy Session Note  Patient Details  Name: Manuel Chapman MRN: [id number] Date of Birth: Sep 03, 1945  Today's Date: 06/03/2021 OT Individual Time: [id number] OT Individual Time Calculation (min): 25 min    Short Term Goals:  Week 3:  OT Short Term Goal 1 (Week 3): LTG=STG 2/2 ELOS  Skilled Therapeutic Interventions/Progress Updates:    Pt semi reclined, no c/o pain, reporting "I think I feel wet".  Noted pt saturated and soiled of urine.  Supine to sit with min assist.  Pt doffed pants with min assist at sit<>stand level at RW.  Pt washed peri region using soap/water/washcloth with mod assist for thoroughness secondary to incontinence.  Donned brief with total assist with pt in standing at [facility] for balance.  Pt donned pants with min assist at sit<>stand and increased time.  Stand pivot to recliner with CGA.  Doffed/donned shirt with supervision.  Call bell in reach, direct hand off to nurse.    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 Restrictions Weight Bearing Restrictions: No    Therapy/Group: Individual Therapy  Amie Critchley 06/03/2021, 12:32 PM

## 2021-06-04 LAB — GLUCOSE, CAPILLARY
Glucose-Capillary: 155 mg/dL — ABNORMAL HIGH (ref 70–99)
Glucose-Capillary: 159 mg/dL — ABNORMAL HIGH (ref 70–99)
Glucose-Capillary: 141 mg/dL — ABNORMAL HIGH (ref 70–99)

## 2021-06-04 NOTE — Progress Notes (Signed)
PROGRESS NOTE   Subjective/Complaints: Pt up in chair. Ate breakfast. Denies any new pain.   ROS: Patient denies fever, rash, sore throat, blurred vision, dizziness, nausea, vomiting, diarrhea, cough, shortness of breath or chest pain, back/neck pain, headache, or mood change.   Objective:   No results found. No results for input(s): WBC, HGB, HCT, PLT in the last 72 hours.  Recent Labs    06/02/21 0507 06/03/21 0646  NA 135 137  K 5.2* 5.0  CL 105 106  CO2 25 23  GLUCOSE 161* 109*  BUN 37* 31*  CREATININE 1.04 1.00  CALCIUM 8.7* 8.7*      Intake/Output Summary (Last 24 hours) at 06/04/2021 1013 Last data filed at 06/03/2021 1816 Gross per 24 hour  Intake 826 ml  Output 625 ml  Net 201 ml         Physical Exam: Vital Signs Blood pressure (!) 149/60, pulse 60, temperature 98.1 F (36.7 C), temperature source Oral, resp. rate 18, height 5' 7.01" (1.702 m), weight 100.7 kg, SpO2 100 %.   Constitutional: No distress . Vital signs reviewed. HEENT: NCAT, EOMI, oral membranes moist Neck: supple Cardiovascular: RRR without murmur. No JVD    Respiratory/Chest: CTA Bilaterally without wheezes or rales. Normal effort    GI/Abdomen: BS +, non-tender, non-distended Ext: no clubbing, cyanosis, or edema Psych: pleasant and cooperative  Skin: No evidence of breakdown, no evidence of rash  Neurologic: fairly alert.  Follows basic commands with some delay. Cranial nerves II through XII intact, motor strength is 5/5 in right deltoid, bicep, tricep, grip,  and bilateral hip flexor, knee extensors, ankle dorsiflexor and plantar flexor. Prox left upper limited by pain--sexam stable Sensory exam normal for light touch and pain in all 4 limbs. No limb ataxia or cerebellar signs. No abnormal tone appreciated.   Musculoskeletal:left shoulder tender, still limited with ROM   Assessment/Plan: 1. Functional deficits which require  3+ hours per day of interdisciplinary therapy in a comprehensive inpatient rehab setting. Physiatrist is providing close team supervision and 24 hour management of active medical problems listed below. Physiatrist and rehab team continue to assess barriers to discharge/monitor patient progress toward functional and medical goals  Care Tool:  Bathing    Body parts bathed by patient: Left arm, Chest, Abdomen, Front perineal area, Right upper leg, Face, Right arm, Left lower leg, Right lower leg, Left upper leg   Body parts bathed by helper: Right arm, Buttocks, Right lower leg, Left lower leg     Bathing assist Assist Level: Minimal Assistance - Patient > 75%     Upper Body Dressing/Undressing Upper body dressing   What is the patient wearing?: Pull over shirt    Upper body assist Assist Level: Set up assist    Lower Body Dressing/Undressing Lower body dressing      What is the patient wearing?: Pants     Lower body assist Assist for lower body dressing: Minimal Assistance - Patient > 75% Assistive Device Comment: reacher   Toileting Toileting Toileting Activity did not occur Press photographer and hygiene only): Refused  Toileting assist Assist for toileting: Moderate Assistance - Patient 50 - 74%  Transfers Chair/bed transfer  Transfers assist  Chair/bed transfer activity did not occur: Refused  Chair/bed transfer assist level: Contact Guard/Touching assist     Locomotion Ambulation   Ambulation assist      Assist level: Minimal Assistance - Patient > 75% Assistive device: Walker-rolling Max distance: 12'   Walk 10 feet activity   Assist     Assist level: Minimal Assistance - Patient > 75% Assistive device: Walker-rolling   Walk 50 feet activity   Assist Walk 50 feet with 2 turns activity did not occur: Safety/medical concerns  Assist level: Minimal Assistance - Patient > 75% Assistive device: Walker-rolling    Walk 150 feet  activity   Assist Walk 150 feet activity did not occur: Safety/medical concerns         Walk 10 feet on uneven surface  activity   Assist Walk 10 feet on uneven surfaces activity did not occur: Safety/medical concerns         Wheelchair     Assist Is the patient using a wheelchair?: Yes Type of Wheelchair: Manual    Wheelchair assist level: Maximal Assistance - Patient 25 - 49% Max wheelchair distance: 20    Wheelchair 50 feet with 2 turns activity    Assist        Assist Level: Maximal Assistance - Patient 25 - 49% (per PT documentation)   Wheelchair 150 feet activity     Assist      Assist Level: Total Assistance - Patient < 25% (per PT documentation)   Blood pressure (!) 149/60, pulse 60, temperature 98.1 F (36.7 C), temperature source Oral, resp. rate 18, height 5' 7.01" (1.702 m), weight 100.7 kg, SpO2 100 %.  Medical Problem List and Plan: 1. 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  Antithrombotics: -DVT/anticoagulation:  Pharmaceutical: Heparin             -antiplatelet therapy: Aspirin 81 mg daily and Plavix 75 mg day x3 weeks then Plavix alone- d'ced ASA    3. Pain Management: Tylenol as needed  -left shoulder improved although rom still limited  -continue aspercreme for left shoulder -can use muscle rub also   -reviewed stretches HE needs to work on while by himself in room    Subacromial left shoulder injection 1/31 with some benefit 4. Mood and sleep/wake:         Continue ritalin 5mg  BID  to help with motivation/arousal- monitor effect prior to increasing dose if needed              -antipsychotic agents: N/A 5. Neuropsych: This patient  is capable of making decisions on his own behalf. 6. Skin/Wound Care: Routine skin checks 7. Fluids/Electrolytes/Nutrition:    -increased IVF. BUN/Cr better 2/8--recheck tomorrow, dc IVF if further improvement 8.  Diabetes mellitus.  Hemoglobin A1c 9.9.  NovoLog 3 units 3 times daily, Levemir  units  daily.    CBG (last 3)  Recent Labs    06/03/21 0603 06/03/21 1140 06/03/21 1652  GLUCAP 113* 208* 157*    2/9 cbg's falling after steroid injection but not back to baseline   -  increase levemir to 13u qam and 10u qhs  9.  Hypertension.  Lisinopril 40 mg daily, Toprol-XL 50 mg daily, hydralazine 50 mg every 8 hours, Hygroton 25 mg daily.   Vitals:   06/03/21 2137 06/03/21 2200  BP: (!) 149/60 (!) 149/60  Pulse:  60  Resp:  18  Temp:  98.1 F (36.7 C)  SpO2:  100%       toprol  100mg  qd,   hydralazine qid  - clonidine 0.1mg  bid-     reduced toprol to 50mg  daily d/t bradycardia  2/9: increased clonidine to 0.2 mg BID--under control 10.  Hyperlipidemia.  continue Lipitor 11.  History of gout.  continue Allopurinol.  Monitor for any gout flareups   13.  Aortic stenosis- OP f/u with cardiology     LOS: 26 days A FACE TO FACE EVALUATION WAS PERFORMED  06/04/2021, 10:13 AM

## 2021-06-04 NOTE — Progress Notes (Signed)
Physical Therapy Session Note  Patient Details  Name: Manuel Chapman MRN: [id number] Date of Birth: 18-Mar-1946  Today's Date: 06/04/2021 PT Missed Time: 45 Minutes Missed Time Reason: Patient unwilling to participate;Patient fatigue  Short Term Goals: Week 3:  PT Short Term Goal 1 (Week 3): STGs = LTGs  Skilled Therapeutic Interventions/Progress Updates:     Pt reports feeling very groggy and does not want to get out of bed while he has an IV in his arm. PT will follow up as appropriate.  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 Restrictions Weight Bearing Restrictions: No   Therapy/Group: Individual Therapy  Breck Coons, PT, DPT 06/04/2021, 5:32 PM

## 2021-06-04 NOTE — Progress Notes (Incomplete)
Occupational Therapy Session Note  Patient Details  Name: Manuel Chapman MRN: [id number] Date of Birth: 30-Nov-1945  {CHL IP REHAB OT TIME CALCULATIONS:[id number]}   Short Term Goals: {OT [id number]}  Skilled Therapeutic Interventions/Progress Updates:     Pt received in *** with *** out of 10 pain in ***. *** provided for pain relief  ADL: Pt completes ADL at overall *** Level. Skilled interventions include:  Neuromuscular Reeducation ****  Therapeutic exercise ***   Therapeutic activity ***  Pt left at end of session in *** with exit alarm on, call light in reach and all needs met   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 Restrictions Weight Bearing Restrictions: No   Therapy/Group: Individual Therapy  Tonny Branch 06/04/2021, 6:46 AM

## 2021-06-05 LAB — BASIC METABOLIC PANEL
Anion gap: 6 (ref 5–15)
BUN: 28 mg/dL — ABNORMAL HIGH (ref 8–23)
CO2: 24 mmol/L (ref 22–32)
Calcium: 8.6 mg/dL — ABNORMAL LOW (ref 8.9–10.3)
Chloride: 106 mmol/L (ref 98–111)
Creatinine, Ser: 0.84 mg/dL (ref 0.61–1.24)
GFR, Estimated: >60 mL/min (ref >60)
Glucose, Bld: 130 mg/dL — ABNORMAL HIGH (ref 70–99)
Potassium: 5 mmol/L (ref 3.5–5.1)
Sodium: 136 mmol/L (ref 135–145)

## 2021-06-05 LAB — GLUCOSE, CAPILLARY
Glucose-Capillary: 123 mg/dL — ABNORMAL HIGH (ref 70–99)
Glucose-Capillary: 130 mg/dL — ABNORMAL HIGH (ref 70–99)
Glucose-Capillary: 210 mg/dL — ABNORMAL HIGH (ref 70–99)
Glucose-Capillary: 200 mg/dL — ABNORMAL HIGH (ref 70–99)

## 2021-06-05 NOTE — Progress Notes (Signed)
Occupational Therapy Session Note  Patient Details  Name: Demaurion Dicioccio MRN: [id number] Date of Birth: 1945-10-19  Today's Date: 06/05/2021 OT Individual Time: 0920-1015 OT Individual Time Calculation (min): 55 min    Short Term Goals: Week 4:  OT Short Term Goal 1 (Week 4): LTG=STG 2/2 ELOS  Skilled Therapeutic Interventions/Progress Updates:    Pt greeted semi-reclined in bed and agreeable to OT treatment session. Pt completed bed mobility with HOB elevated and supervision. UB bathing/dressing at EOB with set-up A. Pt incontinent of bladder. Pt stood w/ RW and close supervision with 1 posterior LOB which he was unable to correct and lowered back to the bed. Pt stood again and was able to maintain standing to wash peri-area. OT assist to wash buttocks and don new brief. Pt sat EOB and needed min A to don pants without adaptive equipment. Close supervision for stand-pivot to wc with RW. Pt brought outside with rec therapist. Worked on standing balance/endurance and hand-eye coordination with standing ball toss activity. On first 10 tosses, patient standing downhill which was throwing patients balance off posteriorly. OT readjusted to pt being more uphill to help pt keep weight over his toes. Pt more balanced needing only CGA  to toss ball back and forth with rec therapist> Pt completed 3 sets of 20 tosses with rest breaks in between. Pt returned to room and ambulated 10 feet to recliner with RW and CGA. Pt left seated in recliner with needs met.   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 Restrictions Weight Bearing Restrictions: No  Pain:  Denies pain   Therapy/Group: Individual Therapy  Valma Cava 06/05/2021, 9:45 AM

## 2021-06-05 NOTE — Progress Notes (Signed)
Recreational Therapy Session Note  Patient Details  Name: Manuel Chapman MRN: [id number] Date of Birth: 1946/01/20 Today's Date: 06/05/2021  Pain: no c/o Skilled Therapeutic Interventions/Progress Updates: Session focused on increasing mood, BUE use and dynamic standing balance during co-treat with OT.  Pt taken outside via w/c transport total assist for time management.  Once outside, pt required mod cues for encouragement to participate.  Once pt engaged in activity, pt smiling, joking & laughing with therapists.  Pt stood with min assist, verbal cues for encouragement for ball toss activity.  Pt more likely to participate when given a target number of reps to complete and discussing leisure interests during activity.  Therapy/Group: Co-Treatment  Mahathi Pokorney 06/05/2021, 12:15 PM

## 2021-06-05 NOTE — Progress Notes (Addendum)
PROGRESS NOTE   Subjective/Complaints: No new issues. Pt sleeping when I came in.   ROS: Patient denies fever, rash, sore throat, blurred vision, dizziness, nausea, vomiting, diarrhea, cough, shortness of breath or chest pain, joint or back/neck pain, headache, or mood change.   Objective:   No results found. No results for input(s): WBC, HGB, HCT, PLT in the last 72 hours.  Recent Labs    06/03/21 0646 06/05/21 0516  NA 137 136  K 5.0 5.0  CL 106 106  CO2 23 24  GLUCOSE 109* 130*  BUN 31* 28*  CREATININE 1.00 0.84  CALCIUM 8.7* 8.6*      Intake/Output Summary (Last 24 hours) at 06/05/2021 1323 Last data filed at 06/05/2021 1694 Gross per 24 hour  Intake 2594.29 ml  Output 1700 ml  Net 894.29 ml         Physical Exam: Vital Signs Blood pressure (!) 153/53, pulse (!) 53, temperature 97.8 F (36.6 C), resp. rate 17, height 5' 7.01" (1.702 m), weight 100.7 kg, SpO2 100 %.   Constitutional: No distress . Vital signs reviewed. HEENT: NCAT, EOMI, oral membranes moist Neck: supple Cardiovascular: RRR without murmur. No JVD    Respiratory/Chest: CTA Bilaterally without wheezes or rales. Normal effort    GI/Abdomen: BS +, non-tender, non-distended Ext: no clubbing, cyanosis, or edema Psych: pleasant and cooperative  Skin: No evidence of breakdown, no evidence of rash  Neurologic: alert  Follows basic commands with some delay. Cranial nerves II through XII intact, motor strength is 5/5 in right deltoid, bicep, tricep, grip,  and bilateral hip flexor, knee extensors, ankle dorsiflexor and plantar flexor. Prox left upper limited by pain--sexam stable Sensory exam normal for light touch and pain in all 4 limbs. No limb ataxia or cerebellar signs. No abnormal tone appreciated.   Musculoskeletal: left shoulder discomfort with ABD >90 degrees   Assessment/Plan: 1. Functional deficits which require 3+ hours per day  of interdisciplinary therapy in a comprehensive inpatient rehab setting. Physiatrist is providing close team supervision and 24 hour management of active medical problems listed below. Physiatrist and rehab team continue to assess barriers to discharge/monitor patient progress toward functional and medical goals  Care Tool:  Bathing    Body parts bathed by patient: Left arm, Chest, Abdomen, Front perineal area, Right upper leg, Face, Right arm, Left lower leg, Right lower leg, Left upper leg   Body parts bathed by helper: Right arm, Buttocks, Right lower leg, Left lower leg     Bathing assist Assist Level: Minimal Assistance - Patient > 75%     Upper Body Dressing/Undressing Upper body dressing   What is the patient wearing?: Pull over shirt    Upper body assist Assist Level: Set up assist    Lower Body Dressing/Undressing Lower body dressing      What is the patient wearing?: Pants     Lower body assist Assist for lower body dressing: Minimal Assistance - Patient > 75% Assistive Device Comment: reacher   Toileting Toileting Toileting Activity did not occur Press photographer and hygiene only): Refused  Toileting assist Assist for toileting: Moderate Assistance - Patient 50 - 74%  Transfers Chair/bed transfer  Transfers assist  Chair/bed transfer activity did not occur: Refused  Chair/bed transfer assist level: Contact Guard/Touching assist     Locomotion Ambulation   Ambulation assist      Assist level: Minimal Assistance - Patient > 75% Assistive device: Walker-rolling Max distance: 12'   Walk 10 feet activity   Assist     Assist level: Minimal Assistance - Patient > 75% Assistive device: Walker-rolling   Walk 50 feet activity   Assist Walk 50 feet with 2 turns activity did not occur: Safety/medical concerns  Assist level: Minimal Assistance - Patient > 75% Assistive device: Walker-rolling    Walk 150 feet activity   Assist Walk  150 feet activity did not occur: Safety/medical concerns         Walk 10 feet on uneven surface  activity   Assist Walk 10 feet on uneven surfaces activity did not occur: Safety/medical concerns         Wheelchair     Assist Is the patient using a wheelchair?: Yes Type of Wheelchair: Manual    Wheelchair assist level: Maximal Assistance - Patient 25 - 49% Max wheelchair distance: 20    Wheelchair 50 feet with 2 turns activity    Assist        Assist Level: Maximal Assistance - Patient 25 - 49% (per PT documentation)   Wheelchair 150 feet activity     Assist      Assist Level: Total Assistance - Patient < 25% (per PT documentation)   Blood pressure (!) 153/53, pulse (!) 53, temperature 97.8 F (36.6 C), resp. rate 17, height 5' 7.01" (1.702 m), weight 100.7 kg, SpO2 100 %.  Medical Problem List and Plan: 1. 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  Antithrombotics: -DVT/anticoagulation:  Pharmaceutical: Heparin             -antiplatelet therapy: Aspirin 81 mg daily and Plavix 75 mg day x3 weeks  -now on Plavix alone- d'ced ASA    3. Pain Management: Tylenol as needed  -left shoulder improved although rom still limited  -continue aspercreme for left shoulder -can use muscle rub also   -reviewed stretches HE needs to work on while by himself in room    Subacromial left shoulder injection 1/31 with some benefit 4. Mood and sleep/wake:       2/10 dc ritalin. Not sure it's having a dramatic effect              -antipsychotic agents: N/A 5. Neuropsych: This patient is capable of making decisions on his own behalf. 6. Skin/Wound Care: Routine skin checks 7.  Fluids/Electrolytes/Nutrition:    -2/10 BUN/Cr improving. Still needs to push po fluids  -dc ivf  -recheck labs Monday 8.  Diabetes mellitus.  Hemoglobin A1c 9.9.  NovoLog 3 units 3 times daily, Levemir  units  daily.    CBG (last 3)  Recent Labs    06/04/21 2133 06/05/21 0603 06/05/21 1153  GLUCAP 141* 123* 130*    2/10 cbg's improving. Continue levemir  13u qam and 10u qhs  9.  Hypertension.  Lisinopril 40 mg daily, Toprol-XL 50 mg daily, hydralazine 50 mg every 8 hours, Hygroton 25 mg daily.   Vitals:   06/04/21 1946 06/05/21 0304  BP: (!) 131/52 (!) 153/53  Pulse: (!) 59 (!) 53  Resp: 16 17  Temp: 97.9 F (36.6 C) 97.8 F (36.6 C)  SpO2: 100% 100%       toprol  100mg  qd,   hydralazine qid  - clonidine 0.1mg  bid-     reduced toprol to 50mg  daily d/t bradycardia  2/10: increased clonidine to 0.2 mg BID--under control 10.  Hyperlipidemia.  continue Lipitor 11.  History of gout.  continue Allopurinol.  Monitor for any gout flareups   13.  Aortic stenosis- OP f/u with cardiology     LOS: 27 days A FACE TO FACE EVALUATION WAS PERFORMED  06/05/2021, 1:23 PM

## 2021-06-05 NOTE — Progress Notes (Signed)
Patient ID: Manuel Chapman, male   DOB: [birth year], 76 y.o.   MRN: [id number]  06/01/2021- SW received updates from pt SIL-Mr. Laural Benes reporting that still waiting on [employer] (pension plan) to mail forms to the home so they can present documents.   SW spoke with [facility] DSS- Franki Monte (Aging and Adult services; [phone]) to discuss if there was anything she was able to do to expedite. Reports she has to wait until forms are presented to [facility] Medicaid CM and MCD application is processed. Plans to continue to follow-up with one another once there is more information.  Cecile Sheerer, MSW, LCSWA Office: [phone] Cell: [phone] Fax: [phone]

## 2021-06-05 NOTE — Progress Notes (Signed)
Physical Therapy Weekly Progress Note  Patient Details  Name: Manuel Chapman MRN: [id number] Date of Birth: Feb 29, 1946  Beginning of progress report period: May 28, 2021 End of progress report period: June 05, 2021  Patient has met 0 of 0  short term goals, due to extending length of stay awaiting for SNF placement. Pt has continued to perform mobility at supervision level, with occasional CGA to mina required for bed mobility and to stand from lower surfaces. Pt does not appear to attempt to make substantial alterations to mobility despite PT uceing to do so. Pt has mentioned that he is depressed about his situation but verbalizes that he does not want to talk to anyone about it.   Patient continues to demonstrate the following deficits muscle weakness, decreased cardiorespiratoy endurance, and decreased sitting balance, decreased standing balance, hemiplegia, and decreased balance strategies and therefore will continue to benefit from skilled PT intervention to increase functional independence with mobility.  Patient progressing toward long term goals..  Continue plan of care.  PT Short Term Goals Week 3:  PT Short Term Goal 1 (Week 3): STGs = LTGs  Skilled Therapeutic Interventions/Progress Updates:  Ambulation/gait training;Cognitive remediation/compensation;Discharge planning;DME/adaptive equipment instruction;Functional mobility training;Pain management;Psychosocial support;Splinting/orthotics;Therapeutic Activities;UE/LE Strength taining/ROM;Visual/perceptual remediation/compensation;Wheelchair propulsion/positioning;UE/LE Coordination activities;Therapeutic Exercise;Stair training;Skin care/wound management;Patient/family education;Neuromuscular re-education;Functional electrical stimulation;Disease management/prevention;Community reintegration;Balance/vestibular training   Pt received seated in recliner and agrees to therapy. No complaint of pain. Sit to stand and stand  step transfer to [facility] with RW and cues for RW management and positioning. WC transport to gym for time management. Pt ambulates x175' extremely slowly with forward flexed posture and shuffling gait pattern. PT cues for upright gaze, increased gait speed and stride length, but pt continues ambulating with noted gait deficits. PT asks pt if he has energy to ambulate additional lap prior to taking seated rest break, and pt verbalizes strongly that he is fatigued and needs a rest break. Following extended seated rest break, pt ambulates additional 175' with same cueing and same results. WC transport back to room. Stand step transfer to bed with RW and cues for positioning. Left supine with alarm intact and all needs within reach.  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 Restrictions Weight Bearing Restrictions: No   Therapy/Group: Individual Therapy  Breck Coons, PT, DPT 06/05/2021, 1:27 PM

## 2021-06-06 LAB — GLUCOSE, CAPILLARY
Glucose-Capillary: 106 mg/dL — ABNORMAL HIGH (ref 70–99)
Glucose-Capillary: 265 mg/dL — ABNORMAL HIGH (ref 70–99)
Glucose-Capillary: 163 mg/dL — ABNORMAL HIGH (ref 70–99)
Glucose-Capillary: 207 mg/dL — ABNORMAL HIGH (ref 70–99)

## 2021-06-07 LAB — GLUCOSE, CAPILLARY
Glucose-Capillary: 171 mg/dL — ABNORMAL HIGH (ref 70–99)
Glucose-Capillary: 137 mg/dL — ABNORMAL HIGH (ref 70–99)
Glucose-Capillary: 145 mg/dL — ABNORMAL HIGH (ref 70–99)
Glucose-Capillary: 198 mg/dL — ABNORMAL HIGH (ref 70–99)

## 2021-06-08 LAB — BASIC METABOLIC PANEL
Anion gap: 6 (ref 5–15)
BUN: 37 mg/dL — ABNORMAL HIGH (ref 8–23)
CO2: 24 mmol/L (ref 22–32)
Calcium: 8.7 mg/dL — ABNORMAL LOW (ref 8.9–10.3)
Chloride: 109 mmol/L (ref 98–111)
Creatinine, Ser: 1.02 mg/dL (ref 0.61–1.24)
GFR, Estimated: >60 mL/min (ref >60)
Glucose, Bld: 138 mg/dL — ABNORMAL HIGH (ref 70–99)
Potassium: 4.8 mmol/L (ref 3.5–5.1)
Sodium: 139 mmol/L (ref 135–145)

## 2021-06-08 LAB — GLUCOSE, CAPILLARY
Glucose-Capillary: 122 mg/dL — ABNORMAL HIGH (ref 70–99)
Glucose-Capillary: 147 mg/dL — ABNORMAL HIGH (ref 70–99)
Glucose-Capillary: 148 mg/dL — ABNORMAL HIGH (ref 70–99)
Glucose-Capillary: 254 mg/dL — ABNORMAL HIGH (ref 70–99)

## 2021-06-08 MED ORDER — CLONIDINE HCL 0.2 MG PO TABS
0.2000 mg | ORAL_TABLET | Freq: Three times a day (TID) | ORAL | Status: DC
  Administered 2021-06-08 – 2021-07-02 (×71): 0.2 mg via ORAL
  Filled 2021-06-08 (×72): qty 1

## 2021-06-08 MED ORDER — SODIUM CHLORIDE 0.45 % IV SOLN: INTRAVENOUS | Status: DC

## 2021-06-08 NOTE — Progress Notes (Signed)
PROGRESS NOTE   Subjective/Complaints: Pt up in gym with PT. Working on stairs. Left knee gives him problems when he goes down stairs especially  ROS: Patient denies fever, rash, sore throat, blurred vision, dizziness, nausea, vomiting, diarrhea, cough, shortness of breath or chest pain,   back/neck pain, headache, or mood change.   Objective:   No results found. No results for input(s): WBC, HGB, HCT, PLT in the last 72 hours.  Recent Labs    06/08/21 0513  NA 139  K 4.8  CL 109  CO2 24  GLUCOSE 138*  BUN 37*  CREATININE 1.02  CALCIUM 8.7*      Intake/Output Summary (Last 24 hours) at 06/08/2021 1234 Last data filed at 06/08/2021 0809 Gross per 24 hour  Intake 2521 ml  Output 200 ml  Net 2321 ml         Physical Exam: Vital Signs Blood pressure (!) 159/73, pulse (!) 56, temperature 97.9 F (36.6 C), temperature source Oral, resp. rate 18, height 5' 7.01" (1.702 m), weight 100.7 kg, SpO2 99 %.   Constitutional: No distress . Vital signs reviewed. HEENT: NCAT, EOMI, oral membranes moist Neck: supple Cardiovascular: RRR without murmur. No JVD    Respiratory/Chest: CTA Bilaterally without wheezes or rales. Normal effort    GI/Abdomen: BS +, non-tender, non-distended Ext: no clubbing, cyanosis, or edema Psych: pleasant and cooperative  Skin: No evidence of breakdown, no evidence of rash  Neurologic: alert, oriented. Follows commands. Fair standing balance. Cranial nerves II through XII intact, motor strength is 5/5 in right deltoid, bicep, tricep, grip,  and bilateral hip flexor, knee extensors, ankle dorsiflexor and plantar flexor. Prox left upper limited by pain--sexam stable Sensory exam normal for light touch and pain in all 4 limbs. No limb ataxia or cerebellar signs. No abnormal tone appreciated.   Musculoskeletal: left knee hyperextends in stance   Assessment/Plan: 1. Functional deficits which  require 3+ hours per day of interdisciplinary therapy in a comprehensive inpatient rehab setting. Physiatrist is providing close team supervision and 24 hour management of active medical problems listed below. Physiatrist and rehab team continue to assess barriers to discharge/monitor patient progress toward functional and medical goals  Care Tool:  Bathing    Body parts bathed by patient: Left arm, Chest, Abdomen, Front perineal area, Right upper leg, Face, Right arm, Left lower leg, Right lower leg, Left upper leg   Body parts bathed by helper: Right arm, Buttocks, Right lower leg, Left lower leg     Bathing assist Assist Level: Minimal Assistance - Patient > 75%     Upper Body Dressing/Undressing Upper body dressing   What is the patient wearing?: Pull over shirt    Upper body assist Assist Level: Set up assist    Lower Body Dressing/Undressing Lower body dressing      What is the patient wearing?: Pants     Lower body assist Assist for lower body dressing: Minimal Assistance - Patient > 75% Assistive Device Comment: reacher   Toileting Toileting Toileting Activity did not occur Press photographer and hygiene only): Refused  Toileting assist Assist for toileting: Moderate Assistance - Patient 50 - 74%  Transfers Chair/bed transfer  Transfers assist  Chair/bed transfer activity did not occur: Refused  Chair/bed transfer assist level: Contact Guard/Touching assist     Locomotion Ambulation   Ambulation assist      Assist level: Minimal Assistance - Patient > 75% Assistive device: Walker-rolling Max distance: 12'   Walk 10 feet activity   Assist     Assist level: Minimal Assistance - Patient > 75% Assistive device: Walker-rolling   Walk 50 feet activity   Assist Walk 50 feet with 2 turns activity did not occur: Safety/medical concerns  Assist level: Minimal Assistance - Patient > 75% Assistive device: Walker-rolling    Walk 150 feet  activity   Assist Walk 150 feet activity did not occur: Safety/medical concerns         Walk 10 feet on uneven surface  activity   Assist Walk 10 feet on uneven surfaces activity did not occur: Safety/medical concerns         Wheelchair     Assist Is the patient using a wheelchair?: Yes Type of Wheelchair: Manual    Wheelchair assist level: Maximal Assistance - Patient 25 - 49% Max wheelchair distance: 20    Wheelchair 50 feet with 2 turns activity    Assist        Assist Level: Maximal Assistance - Patient 25 - 49% (per PT documentation)   Wheelchair 150 feet activity     Assist      Assist Level: Total Assistance - Patient < 25% (per PT documentation)   Blood pressure (!) 159/73, pulse (!) 56, temperature 97.9 F (36.6 C), temperature source Oral, resp. rate 18, height 5' 7.01" (1.702 m), weight 100.7 kg, SpO2 99 %.  Medical Problem List and Plan: 1. 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  Antithrombotics: -DVT/anticoagulation:  Pharmaceutical: Heparin             -antiplatelet therapy: Aspirin 81 mg daily and Plavix 75 mg day x3 weeks  -now on Plavix alone    3. Pain Management: Tylenol as needed  -left shoulder improved although rom still limited  -continue aspercreme for left shoulder -can use muscle rub also   -reviewed stretches HE needs to work on while by himself in room    Subacromial left shoulder injection 1/31 with some benefit 4. Mood and sleep/wake:       2/10 dc ritalin. Not sure it's having a dramatic effect              -antipsychotic agents: N/A 5. Neuropsych: This patient is capable of making decisions on his own behalf. 6.  Skin/Wound Care: Routine skin checks 7. Fluids/Electrolytes/Nutrition:    -2/13 BUN/Cr increased again while off IVF  -Resume IVF  -recheck labs Thursday 8.  Diabetes mellitus.  Hemoglobin A1c 9.9.  NovoLog 3 units 3 times daily, Levemir  units  daily.    CBG (last 3)  Recent Labs    06/07/21 2026 06/08/21 0614 06/08/21 1200  GLUCAP 198* 122* 147*    2/13 cbg's fair to elevated. Continue levemir  13u qam and 10u qhs  9.  Hypertension.  Lisinopril 40 mg daily, Toprol-XL 50 mg daily, hydralazine 50 mg every 8 hours, Hygroton 25 mg daily.   Vitals:   06/07/21 1935 06/08/21 0322  BP: (!) 125/99 (!) 159/73  Pulse: 67 (!) 56  Resp: 18 18  Temp: 98.1 F (36.7 C) 97.9 F (36.6 C)  SpO2: 98% 99%       toprol  100mg  qd,   hydralazine qid  - clonidine 0.1mg  bid-     reduced toprol to 50mg  daily d/t bradycardia  2/13: increased clonidine to 0.2 mg BID- increase to tid 10.  Hyperlipidemia.  continue Lipitor 11.  History of gout.  continue Allopurinol.  Monitor for any gout flareups   13.  Aortic stenosis- OP f/u with cardiology     LOS: 30 days A FACE TO FACE EVALUATION WAS PERFORMED  Ranelle Oyster 06/08/2021, 12:34 PM

## 2021-06-08 NOTE — Progress Notes (Signed)
Physical Therapy Session Note  Patient Details  Name: Manuel Chapman MRN: [id number] Date of Birth: 27-Aug-1945  Today's Date: 06/08/2021 PT Individual Time: 4098-1191 PT Individual Time Calculation (min): 40 min   Short Term Goals: Week 4:  PT Short Term Goal 1 (Week 4): STGs = LTGs  Skilled Therapeutic Interventions/Progress Updates:     Pt received supine in bed and agrees to therapy. No complaint of pain. Pt noted to have been incontinent of urine in brief. Clean brief donned and pt performs supine to sit with bed features and cues for sequencing. Sit to stand to RW with cues for initiation and stand step transfer with cues for positioning. WC transport to gym for time management. Pt performs x4 6" steps with bilateral hand rails and minA, with cues for step sequencing. Following extended seated rest break, pt completes x7 steps with bilateral hand rails, ascending with L leg first and descending with L leg first. Performed for strengthening and functional training of L hemibody. Pt perform 1 more set of x4 steps with same assist levels. WC transport back room. Stand step to recliner with RW and cues for positioning. Left with alarm intact and all needs within reach.  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 Restrictions Weight Bearing Restrictions: No    Therapy/Group: Individual Therapy  [name], PT, DPT 06/08/2021, 12:37 PM

## 2021-06-08 NOTE — Progress Notes (Signed)
Occupational Therapy Session Note  Patient Details  Name: Zeyad Delaguila MRN: [id number] Date of Birth: Nov 22, 1945  Today's Date: 06/08/2021 OT Individual Time: 1120-1205 OT Individual Time Calculation (min): 45 min    Short Term Goals: Week 4:  OT Short Term Goal 1 (Week 4): LTG=STG 2/2 ELOS  Skilled Therapeutic Interventions/Progress Updates:    Pt greeted seated in recliner and agreeable to OT treatment session. Pt finally agreed to shower today! Pt stood and ambulated to the shower with RW and close supervision. Pt needed verbal cues and CGA to transfer over small shower ledge and sit on tub bench in shower. Bathing completed using LH sponge to get lower legs, then pt able to stand with CGA, but needed OT assist to wash buttocks due to body habitus. Pt transitioned out of shower with CGA and ambulated to recliner for dressing tasks. PT can use reacher to thread pants, but needed OT assist to don socks without sock-aid. Pt left seated in recliner at end of session with chair alarm pad on, call bell in reach, and needs met.   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 Restrictions Weight Bearing Restrictions: No Pain:  Denies pain   Therapy/Group: Individual Therapy  Valma Cava 06/08/2021, 12:00 PM

## 2021-06-09 LAB — GLUCOSE, CAPILLARY
Glucose-Capillary: 171 mg/dL — ABNORMAL HIGH (ref 70–99)
Glucose-Capillary: 172 mg/dL — ABNORMAL HIGH (ref 70–99)
Glucose-Capillary: 243 mg/dL — ABNORMAL HIGH (ref 70–99)
Glucose-Capillary: 144 mg/dL — ABNORMAL HIGH (ref 70–99)
Glucose-Capillary: 150 mg/dL — ABNORMAL HIGH (ref 70–99)

## 2021-06-09 MED ORDER — INSULIN DETEMIR 100 UNIT/ML ~~LOC~~ SOLN
12.0000 [IU] | Freq: Every day | SUBCUTANEOUS | Status: DC
  Administered 2021-06-09 – 2021-06-18 (×10): 12 [IU] via SUBCUTANEOUS
  Filled 2021-06-09 (×12): qty 0.12

## 2021-06-09 MED ORDER — INSULIN DETEMIR 100 UNIT/ML ~~LOC~~ SOLN
12.0000 [IU] | Freq: Every day | SUBCUTANEOUS | Status: DC
  Administered 2021-06-10 – 2021-06-19 (×10): 12 [IU] via SUBCUTANEOUS
  Filled 2021-06-09 (×10): qty 0.12

## 2021-06-09 NOTE — Patient Care Conference (Signed)
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 Faith Rogue Social Worker Present: Cecile Sheerer, LCSWA Nurse Present: Kennyth Arnold, RN PT Present: Malachi Pro, PT OT Present: Kearney Hard, OT SLP Present: Feliberto Gottron, SLP PPS Coordinator present : Fae Pippin, SLP     Current Status/Progress Goal Weekly Team Focus  Bowel/Bladder   pt can be cont or inc of b and b  decrease inc episodes  assess q shift andoprn, timed toilette   Swallow/Nutrition/ Hydration             ADL's   Min A to wash bottom and toileting, supervision mobility, CGA shower transfer  CGA  dc planning, activity tolerance, balance, safety, self-care retraining   Mobility   supervision all mobility, ambulating up to 500' with RW  Supervision  continued L hemibody NMR, balance, endurance   Communication             Safety/Cognition/ Behavioral Observations            Pain   pt has no c/o pain  remain pain free  assess pain q 4hr and prn   Skin   no skin issues  no new breakdown  assess skin q shift and prn     Discharge Planning:  Pt will d/c to SNF. SNF location pending. Pt declined by insurance for placement even after appeal at federal level. APS referral submitted, however, not accepted. Pt was assigned a placement SW instead. Aging an Adult  [facility] SW working on SNF placement at this time, however, has to wait for LTC Medicaid to be processed first. LTC Medicaid is pending pension income and family waiting for this to arrive in the mail.   Team Discussion: May need IVF before SNF placement. Remains incontinent. Waiting on LTC Medicaid for placement. Keep fluids in front of patient.  Patient on target to meet rehab goals: yes, supervision goals. Patient is at baseline. Patient did agree to shower yesterday. Swallowing returned to normal.   *See Care Plan and progress notes for long and short-term goals.   Revisions to Treatment Plan:  Finalizing discharge plans. SLP discharged patient.   Teaching Needs: Family education completed.   Current Barriers to Discharge: Lack of/limited family support and Insurance for SNF coverage  Possible Resolutions to Barriers: SW pursuing SNF options.      Medical Summary Current Status: no changes,  pain controlled, mild prerenal azotemia  Barriers to Discharge: Medical stability   Possible Resolutions to Barriers/Weekly Focus: iv hydration, improve nutrition   Continued Need for Acute Rehabilitation Level of Care: The patient requires daily medical management by a physician with specialized training in physical medicine and rehabilitation for the following reasons: Direction of a multidisciplinary physical rehabilitation program to maximize functional independence : Yes Medical management of patient stability for increased activity during participation in  an intensive rehabilitation regime.: Yes Analysis of laboratory values and/or radiology reports with any subsequent need for medication adjustment and/or medical intervention. : Yes   I attest that I was present, lead the team conference, and concur with the assessment and plan of the team.   Tennis Must 06/09/2021, 2:42 PM

## 2021-06-09 NOTE — Progress Notes (Addendum)
PROGRESS NOTE   Subjective/Complaints: No  new issues this morning. Denies new pain.   ROS: Patient denies fever, rash, sore throat, blurred vision, dizziness, nausea, vomiting, diarrhea, cough, shortness of breath or chest pain,   headache, or mood change.   Objective:   No results found. No results for input(s): WBC, HGB, HCT, PLT in the last 72 hours.  Recent Labs    06/08/21 0513  NA 139  K 4.8  CL 109  CO2 24  GLUCOSE 138*  BUN 37*  CREATININE 1.02  CALCIUM 8.7*      Intake/Output Summary (Last 24 hours) at 06/09/2021 0953 Last data filed at 06/09/2021 0802 Gross per 24 hour  Intake 420 ml  Output 1100 ml  Net -680 ml         Physical Exam: Vital Signs Blood pressure (!) 159/58, pulse 61, temperature 98.1 F (36.7 C), resp. rate 16, height 5' 7.01" (1.702 m), weight 100.7 kg, SpO2 100 %.   Constitutional: No distress . Vital signs reviewed. HEENT: NCAT, EOMI, oral membranes moist Neck: supple Cardiovascular: RRR without murmur. No JVD    Respiratory/Chest: CTA Bilaterally without wheezes or rales. Normal effort    GI/Abdomen: BS +, non-tender, non-distended Ext: no clubbing, cyanosis, or edema Psych: pleasant and cooperative  Skin: No evidence of breakdown, no evidence of rash  Neurologic: alert, oriented. Follows commands. Fair standing balance. Cranial nerves II through XII intact, motor strength is 5/5 in right deltoid, bicep, tricep, grip,  and bilateral hip flexor, knee extensors, ankle dorsiflexor and plantar flexor. Prox left upper limited by pain--sexam stable Sensory exam normal for light touch and pain in all 4 limbs. No limb ataxia or cerebellar signs. No abnormal tone appreciated.   Musculoskeletal: left shoulder pain at 80 deg abd   Assessment/Plan: 1. Functional deficits which require 3+ hours per day of interdisciplinary therapy in a comprehensive inpatient rehab  setting. Physiatrist is providing close team supervision and 24 hour management of active medical problems listed below. Physiatrist and rehab team continue to assess barriers to discharge/monitor patient progress toward functional and medical goals  Care Tool:  Bathing    Body parts bathed by patient: Left arm, Chest, Abdomen, Front perineal area, Right upper leg, Face, Right arm, Left lower leg, Right lower leg, Left upper leg   Body parts bathed by helper: Right arm, Buttocks, Right lower leg, Left lower leg     Bathing assist Assist Level: Minimal Assistance - Patient > 75%     Upper Body Dressing/Undressing Upper body dressing   What is the patient wearing?: Pull over shirt    Upper body assist Assist Level: Set up assist    Lower Body Dressing/Undressing Lower body dressing      What is the patient wearing?: Pants     Lower body assist Assist for lower body dressing: Minimal Assistance - Patient > 75% Assistive Device Comment: reacher   Toileting Toileting Toileting Activity did not occur Press photographer and hygiene only): Refused  Toileting assist Assist for toileting: Moderate Assistance - Patient 50 - 74%     Transfers Chair/bed transfer  Transfers assist  Chair/bed transfer activity did not occur: Refused  Chair/bed transfer assist level: Contact Guard/Touching assist     Locomotion Ambulation   Ambulation assist      Assist level: Minimal Assistance - Patient > 75% Assistive device: Walker-rolling Max distance: 12'   Walk 10 feet activity   Assist     Assist level: Minimal Assistance - Patient > 75% Assistive device: Walker-rolling   Walk 50 feet activity   Assist Walk 50 feet with 2 turns activity did not occur: Safety/medical concerns  Assist level: Minimal Assistance - Patient > 75% Assistive device: Walker-rolling    Walk 150 feet activity   Assist Walk 150 feet activity did not occur: Safety/medical concerns          Walk 10 feet on uneven surface  activity   Assist Walk 10 feet on uneven surfaces activity did not occur: Safety/medical concerns         Wheelchair     Assist Is the patient using a wheelchair?: Yes Type of Wheelchair: Manual    Wheelchair assist level: Maximal Assistance - Patient 25 - 49% Max wheelchair distance: 20    Wheelchair 50 feet with 2 turns activity    Assist        Assist Level: Maximal Assistance - Patient 25 - 49% (per PT documentation)   Wheelchair 150 feet activity     Assist      Assist Level: Total Assistance - Patient < 25% (per PT documentation)   Blood pressure (!) 159/58, pulse 61, temperature 98.1 F (36.7 C), resp. rate 16, height 5' 7.01" (1.702 m), weight 100.7 kg, SpO2 100 %.  Medical Problem List and Plan: 1. 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 Interdisciplinary team conference today to discuss goals, barriers to discharge, and dc planning.    2.  Antithrombotics: -DVT/anticoagulation:  Pharmaceutical: Heparin             -antiplatelet therapy: Aspirin 81 mg daily and Plavix 75 mg day x3 weeks  -now on Plavix alone    3. Pain Management: Tylenol as needed  -left shoulder improved although rom still limited  -continue aspercreme for left shoulder -can use muscle rub also   -reviewed stretches HE needs to work on while by himself in room    Subacromial left shoulder injection 1/31 with some benefit 4. Mood and sleep/wake:       2/10 dc ritalin. Not sure it's having a dramatic effect              -antipsychotic agents: N/A 5. Neuropsych: This patient is capable of making decisions on his own behalf. 6. Skin/Wound Care: Routine  skin checks 7. Fluids/Electrolytes/Nutrition:    -2/134BUN/Cr increased again while off IVF  -Resumed IVF  -recheck labs Thursday 8.  Diabetes mellitus.  Hemoglobin A1c 9.9.  NovoLog 3 units 3 times daily, Levemir  units  daily.    CBG (last 3)  Recent Labs    06/08/21 1647 06/08/21 2135 06/09/21 0819  GLUCAP 148* 254* 171*    2/14 cbg's  elevated. adjust levemir to 12u qam and 12u qhs  9.  Hypertension.  Lisinopril 40 mg daily, Toprol-XL 50  mg daily, hydralazine 50 mg every 8 hours, Hygroton 25 mg daily.   Vitals:   06/08/21 2050 06/09/21 0530  BP: (!) 169/58 (!) 159/58  Pulse: (!) 56 61  Resp: 16 16  Temp: 97.6 F (36.4 C) 98.1 F (36.7 C)  SpO2: 100% 100%       toprol  100mg  qd,   hydralazine qid  - clonidine 0.1mg  bid-     reduced toprol to 50mg  daily d/t bradycardia  2/14: increased clonidine to 0.2 mg  tid 10.  Hyperlipidemia.  continue Lipitor 11.  History of gout.  continue Allopurinol.  Monitor for any gout flareups   13.  Aortic stenosis- OP f/u with cardiology     LOS: 31 days A FACE TO FACE EVALUATION WAS PERFORMED  06/09/2021, 9:53 AM

## 2021-06-09 NOTE — Progress Notes (Signed)
Patient ID: Manuel Chapman, male   DOB: Nov 01, 1945, 76 y.o.   MRN: [id number]  D/c remains pending on LTC Medicare qualified, and forms from pension to submit to DSS to process LTC application.   Cecile Sheerer, MSW, LCSWA Office: [phone] Cell: [phone] Fax: [phone]

## 2021-06-09 NOTE — Progress Notes (Signed)
Physical Therapy Session Note  Patient Details  Name: Javiel Clift MRN: [id number] Date of Birth: 1946/04/20  Today's Date: 06/09/2021 PT Individual Time: [id number] PT Individual Time Calculation (min): 40 min   Short Term Goals: Week 4:  PT Short Term Goal 1 (Week 4): STGs = LTGs  Skilled Therapeutic Interventions/Progress Updates:     Pt received seated in recliner and agrees to therapy. No complaint of pain. Stand step transfer to [facility] with RW and cues for sequencing. WC transport to gym for time management. Pt performs sit to stand with minA and no AD, and encouraged to attempt ambulation without AD to work on dynamic balance. Pt is very apprehensive but eventually agreeable. Pt ambulates x15' with modA and cues for weight shifting and upright gaze to improve posture and balance. Pt has difficulty adjusting posture and continues to remain forward flexed and with gaze fixed on ground throughout gait trial. Pt requires +2 to bring WC for unplanned seated rest break. Following extended seated rest break, pt perform additional sit to stand with minA and attempt to achieve neutral posture with mirror for visual feedback. Max multimodal cueing to extend hips and trunk but pt remaining relatively forward flexed throughout. WC transport back to room and stand step transfer to recliner with RW. Left with alarm intact and all needs within reach.  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

## 2021-06-09 NOTE — Progress Notes (Signed)
Occupational Therapy Weekly Progress Note  Patient Details  Name: Manuel Chapman MRN: [id number] Date of Birth: 06/11/1945  Beginning of progress report period: May 10, 2021 End of progress report period: June 09, 2021  Today's Date: 06/09/2021 OT Individual Time: 1021-1173 OT Individual Time Calculation (min): 40 min   Patient remains functionally the same. He has been participating well in therapies and is getting up out of bed daily. Patient did agree to shower this week and overall did well with shower transfer at [facility] and tolerated showering from tub bench. Pt will require assistance at dc as he did at baseline for incontinence and cleansing buttocks. Continue current POC for SN[facility] level of care.  Patient continues to demonstrate the following deficits: muscle weakness, decreased cardiorespiratoy endurance, and decreased standing balance and decreased balance strategies and therefore will continue to benefit from skilled OT intervention to enhance overall performance with BADL and Reduce care partner burden.  Patient progressing toward long term goals..  Continue plan of care.  OT Short Term Goals Week 5:  OT Short Term Goal 1 (Week 5): LTG=STG 2/2 ELOS  Skilled Therapeutic Interventions/Progress Updates:    Patient greeted semi-reclined in bed and agreeable to OT treatment session focused on self-care retraining and activity tolerance. Pt completed bed mobility with supervision. UB bathing and dressing at EOB with set-up A. Pt incontinent of smear of BM and unaware requiring OT assist to clean himself. OT mentioned trying to use an elongated toilet aid to assist with peri-care needs, but pt resistive. Pt also very incontinent and at risk of skin breakdown as he does not recognize when he has been incontinent and needs to clean himself up. LB bathing/dressing with use of reacher to thread pants. Pt then ambulated in room to recliner with RW and close supervision. Pt completed 5  sit<>stands with focus on anterior weight shift and powering up. Pt left seated in recliner with  chair alarm on, call bell in reach, and needs met.   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

## 2021-06-10 LAB — GLUCOSE, CAPILLARY
Glucose-Capillary: 95 mg/dL (ref 70–99)
Glucose-Capillary: 125 mg/dL — ABNORMAL HIGH (ref 70–99)
Glucose-Capillary: 155 mg/dL — ABNORMAL HIGH (ref 70–99)
Glucose-Capillary: 225 mg/dL — ABNORMAL HIGH (ref 70–99)

## 2021-06-10 NOTE — Progress Notes (Signed)
Occupational Therapy Session Note  Patient Details  Name: Rachid Hlavaty MRN: [id number] Date of Birth: 02-May-1945  Today's Date: 06/10/2021 OT Individual Time: [id number] OT Individual Time Calculation (min): 30 min    Short Term Goals: Week 5:  OT Short Term Goal 1 (Week 5): LTG=STG 2/2 ELOS  Skilled Therapeutic Interventions/Progress Updates:    Pt sleeping in bed upon arrival but easily aroused. Pt denies painn this morning. OT intervention with focus on bed mobility, sit<>stand, functional transfers with RW, standing balance, and LB dressing. Min A for supine>sit EOB. Sitting balance with supervisoin. Sit<>stand from EOB with CGA. Pt amb with RW to recliner with CGA. Sit<>standX 5 from recliner with CGA/supervisoin. Pt required min A for LB dressing to thread pants over BLE. Pt able to pull pants over hips with CGA for standing balance. Pt remained in relciner with all needs within reach. Seat alarm activated.   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

## 2021-06-10 NOTE — Progress Notes (Signed)
Physical Therapy Session Note  Patient Details  Name: Manuel Chapman MRN: [id number] Date of Birth: December 23, 1945  Today's Date: 06/10/2021 PT Individual Time: 0938-1829 PT Individual Time Calculation (min): 40 min   Short Term Goals: Week 4:  PT Short Term Goal 1 (Week 4): STGs = LTGs  Skilled Therapeutic Interventions/Progress Updates:     Pt received seated in recliner and agrees to therapy. No complaint of pain. Sit to stand and stand step transfer to [facility] with RW and cues for sequencing and positioning. RN arrives to provide insulin for pt. WC transport to gym for time management. Pt performs stand step transfer to Nustep with cues for hand placement and increased eccentric control of stand to sit. Pt completes Nustep for strength and endurance training. PT cues pt to maintain rate >40 steps per minute and pt effectively maintains speed at desired rate without requiring additional cueing. Pt completes x7:00 at workload of 5 prior to requiring rest break. Following extended seated rest break, pt completes x5:00 additional. Cues to attend to L upper extremity to ensure pt maintains grip. Pt does not have any instances of losing grip during Nustep activity.   Stand step to [facility] with no AD and minA required, with cues for hand placement and sequencing. WC transport back to room. Pt ambulates x30' back to recliner. Left seated with alarm intact and all needs within reach.  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

## 2021-06-10 NOTE — Progress Notes (Signed)
PROGRESS NOTE   Subjective/Complaints: Pt awake, in good spirits. No problems reported  ROS: Patient denies fever, rash, sore throat, blurred vision, dizziness, nausea, vomiting, diarrhea, cough, shortness of breath or chest pain, joint or back/neck pain, headache, or mood change.   Objective:   No results found. No results for input(s): WBC, HGB, HCT, PLT in the last 72 hours.  Recent Labs    06/08/21 0513  NA 139  K 4.8  CL 109  CO2 24  GLUCOSE 138*  BUN 37*  CREATININE 1.02  CALCIUM 8.7*      Intake/Output Summary (Last 24 hours) at 06/10/2021 1003 Last data filed at 06/10/2021 0421 Gross per 24 hour  Intake 236 ml  Output 1000 ml  Net -764 ml         Physical Exam: Vital Signs Blood pressure (!) 156/74, pulse 62, temperature 98.3 F (36.8 C), temperature source Oral, resp. rate 18, height 5' 7.01" (1.702 m), weight 100.7 kg, SpO2 98 %.   Constitutional: No distress . Vital signs reviewed. HEENT: NCAT, EOMI, oral membranes moist Neck: supple Cardiovascular: RRR without murmur. No JVD    Respiratory/Chest: CTA Bilaterally without wheezes or rales. Normal effort    GI/Abdomen: BS +, non-tender, non-distended Ext: no clubbing, cyanosis, or edema Psych: pleasant and cooperative  Skin: No evidence of breakdown, no evidence of rash  Neurologic: alert, oriented. Follows commands. Fair standing balance. Cranial nerves II through XII intact, motor strength is 5/5 in right deltoid, bicep, tricep, grip,  and bilateral hip flexor, knee extensors, ankle dorsiflexor and plantar flexor. Exam stable today.  Sensory exam normal for light touch and pain in all 4 limbs. No limb ataxia or cerebellar signs. No abnormal tone appreciated.   Musculoskeletal: improved functional use of left shoulder   Assessment/Plan: 1. Functional deficits which require 3+ hours per day of interdisciplinary therapy in a comprehensive  inpatient rehab setting. Physiatrist is providing close team supervision and 24 hour management of active medical problems listed below. Physiatrist and rehab team continue to assess barriers to discharge/monitor patient progress toward functional and medical goals  Care Tool:  Bathing    Body parts bathed by patient: Left arm, Chest, Abdomen, Front perineal area, Right upper leg, Face, Right arm, Left lower leg, Right lower leg, Left upper leg   Body parts bathed by helper: Right arm, Buttocks, Right lower leg, Left lower leg     Bathing assist Assist Level: Minimal Assistance - Patient > 75%     Upper Body Dressing/Undressing Upper body dressing   What is the patient wearing?: Pull over shirt    Upper body assist Assist Level: Set up assist    Lower Body Dressing/Undressing Lower body dressing      What is the patient wearing?: Pants     Lower body assist Assist for lower body dressing: Minimal Assistance - Patient > 75% Assistive Device Comment: reacher   Toileting Toileting Toileting Activity did not occur Press photographer and hygiene only): Refused  Toileting assist Assist for toileting: Moderate Assistance - Patient 50 - 74%     Transfers Chair/bed transfer  Transfers assist  Chair/bed transfer activity did not occur: Refused  Chair/bed transfer assist level: Contact Guard/Touching assist     Locomotion Ambulation   Ambulation assist      Assist level: Minimal Assistance - Patient > 75% Assistive device: Walker-rolling Max distance: 12'   Walk 10 feet activity   Assist     Assist level: Minimal Assistance - Patient > 75% Assistive device: Walker-rolling   Walk 50 feet activity   Assist Walk 50 feet with 2 turns activity did not occur: Safety/medical concerns  Assist level: Minimal Assistance - Patient > 75% Assistive device: Walker-rolling    Walk 150 feet activity   Assist Walk 150 feet activity did not occur: Safety/medical  concerns         Walk 10 feet on uneven surface  activity   Assist Walk 10 feet on uneven surfaces activity did not occur: Safety/medical concerns         Wheelchair     Assist Is the patient using a wheelchair?: Yes Type of Wheelchair: Manual    Wheelchair assist level: Maximal Assistance - Patient 25 - 49% Max wheelchair distance: 20    Wheelchair 50 feet with 2 turns activity    Assist        Assist Level: Maximal Assistance - Patient 25 - 49% (per PT documentation)   Wheelchair 150 feet activity     Assist      Assist Level: Total Assistance - Patient < 25% (per PT documentation)   Blood pressure (!) 156/74, pulse 62, temperature 98.3 F (36.8 C), temperature source Oral, resp. rate 18, height 5' 7.01" (1.702 m), weight 100.7 kg, SpO2 98 %.  Medical Problem List and Plan: 1. 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  Antithrombotics: -DVT/anticoagulation:  Pharmaceutical: Heparin             -antiplatelet therapy: Aspirin 81 mg daily and Plavix 75 mg day x3 weeks  -now on Plavix alone    3. Pain Management: Tylenol as needed  -left shoulder improved although rom still limited  -continue aspercreme for left shoulder -can use muscle rub also   -reviewed stretches HE needs to work on while by himself in room    Subacromial left shoulder injection 1/31 with some benefit 4. Mood and sleep/wake:      -now off ritalin             -antipsychotic agents: N/A 5. Neuropsych: This patient is capable of making decisions on his own behalf. 6. Skin/Wound Care: Routine skin checks 7. Fluids/Electrolytes/Nutrition:    -BUN/Cr increased again while off IVF  -Resumed IVF  -F/u  labs Thursday 8.  Diabetes mellitus.  Hemoglobin A1c 9.9.  NovoLog 3 units 3 times daily, Levemir  units  daily.    CBG (last 3)  Recent Labs    06/09/21 2042 06/09/21 2318 06/10/21 0604  GLUCAP 144* 150* 95    2/15 cbg's improved so far with adjustment of levemir to 12u qam and 12u qhs  9.  Hypertension.  Lisinopril 40 mg daily, Toprol-XL 50 mg daily, hydralazine 50 mg every 8 hours, Hygroton 25 mg daily.   Vitals:   06/09/21 1939  06/10/21 0424  BP: (!) 144/60 (!) 156/74  Pulse: 65 62  Resp: 18 18  Temp: 97.7 F (36.5 C) 98.3 F (36.8 C)  SpO2: 100% 98%       hydralazine 50mg  qid   reduced toprol to 50mg  daily d/t bradycardia   clonidine to 0.2 mg  tid 10.  Hyperlipidemia.  continue Lipitor 11.  History of gout.  continue Allopurinol.  Monitor for any gout flareups   13.  Aortic stenosis- OP f/u with cardiology     LOS: 32 days A FACE TO FACE EVALUATION WAS PERFORMED  06/10/2021, 10:03 AM

## 2021-06-11 LAB — GLUCOSE, CAPILLARY
Glucose-Capillary: 166 mg/dL — ABNORMAL HIGH (ref 70–99)
Glucose-Capillary: 122 mg/dL — ABNORMAL HIGH (ref 70–99)
Glucose-Capillary: 129 mg/dL — ABNORMAL HIGH (ref 70–99)
Glucose-Capillary: 152 mg/dL — ABNORMAL HIGH (ref 70–99)

## 2021-06-11 LAB — BASIC METABOLIC PANEL
Anion gap: 7 (ref 5–15)
BUN: 31 mg/dL — ABNORMAL HIGH (ref 8–23)
CO2: 23 mmol/L (ref 22–32)
Calcium: 8.5 mg/dL — ABNORMAL LOW (ref 8.9–10.3)
Chloride: 107 mmol/L (ref 98–111)
Creatinine, Ser: 0.9 mg/dL (ref 0.61–1.24)
GFR, Estimated: >60 mL/min (ref >60)
Glucose, Bld: 165 mg/dL — ABNORMAL HIGH (ref 70–99)
Potassium: 4.7 mmol/L (ref 3.5–5.1)
Sodium: 137 mmol/L (ref 135–145)

## 2021-06-11 NOTE — Progress Notes (Signed)
Physical Therapy Session Note  Patient Details  Name: Manuel Chapman MRN: [id number] Date of Birth: Nov 21, 1945  Today's Date: 06/11/2021 PT Missed Time: 45 Minutes Missed Time Reason: Patient unwilling to participate;Patient fatigue  Short Term Goals: Week 4:  PT Short Term Goal 1 (Week 4): STGs = LTGs  Skilled Therapeutic Interventions/Progress Updates:     Pt received supine in bed having IV bag changed. Pt reports not feeling like therapy today. PT will follow up as able.  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

## 2021-06-11 NOTE — Progress Notes (Signed)
PROGRESS NOTE   Subjective/Complaints: Pt in good spirits. No new issues. Discussed his fluid intake with him this morning  ROS: Patient denies fever, rash, sore throat, blurred vision, dizziness, nausea, vomiting, diarrhea, cough, shortness of breath or chest pain,   back/neck pain, headache, or mood change.   Objective:   No results found. No results for input(s): WBC, HGB, HCT, PLT in the last 72 hours.  Recent Labs    06/11/21 0552  NA 137  K 4.7  CL 107  CO2 23  GLUCOSE 165*  BUN 31*  CREATININE 0.90  CALCIUM 8.5*      Intake/Output Summary (Last 24 hours) at 06/11/2021 0959 Last data filed at 06/11/2021 0800 Gross per 24 hour  Intake 4398.89 ml  Output 1950 ml  Net 2448.89 ml         Physical Exam: Vital Signs Blood pressure 127/74, pulse 71, temperature 98.1 F (36.7 C), temperature source Oral, resp. rate 17, height 5' 7.01" (1.702 m), weight 100.7 kg, SpO2 99 %.   Constitutional: No distress . Vital signs reviewed. HEENT: NCAT, EOMI, oral membranes moist Neck: supple Cardiovascular: RRR without murmur. No JVD    Respiratory/Chest: CTA Bilaterally without wheezes or rales. Normal effort    GI/Abdomen: BS +, non-tender, non-distended Ext: no clubbing, cyanosis, or edema Psych: pleasant and cooperative   Skin: No evidence of breakdown, no evidence of rash  Neurologic: alert, oriented. Follows commands. Fair standing balance. Cranial nerves II through XII intact, motor strength is 5/5 in right deltoid, bicep, tricep, grip,  and bilateral hip flexor, knee extensors, ankle dorsiflexor and plantar flexor. Exam remains stable today.  Sensory exam normal for light touch and pain in all 4 limbs. No limb ataxia or cerebellar signs. No abnormal tone appreciated.   Musculoskeletal: improved functional use of left shoulder. Tolerates passive range much better   Assessment/Plan: 1. Functional deficits which  require 3+ hours per day of interdisciplinary therapy in a comprehensive inpatient rehab setting. Physiatrist is providing close team supervision and 24 hour management of active medical problems listed below. Physiatrist and rehab team continue to assess barriers to discharge/monitor patient progress toward functional and medical goals  Care Tool:  Bathing    Body parts bathed by patient: Left arm, Chest, Abdomen, Front perineal area, Right upper leg, Face, Right arm, Left lower leg, Right lower leg, Left upper leg   Body parts bathed by helper: Right arm, Buttocks, Right lower leg, Left lower leg     Bathing assist Assist Level: Minimal Assistance - Patient > 75%     Upper Body Dressing/Undressing Upper body dressing   What is the patient wearing?: Pull over shirt    Upper body assist Assist Level: Set up assist    Lower Body Dressing/Undressing Lower body dressing      What is the patient wearing?: Pants     Lower body assist Assist for lower body dressing: Minimal Assistance - Patient > 75% Assistive Device Comment: reacher   Toileting Toileting Toileting Activity did not occur Landscape architect and hygiene only): Refused  Toileting assist Assist for toileting: Moderate Assistance - Patient 50 - 74%     Transfers Chair/bed  transfer  Transfers assist  Chair/bed transfer activity did not occur: Refused  Chair/bed transfer assist level: Contact Guard/Touching assist     Locomotion Ambulation   Ambulation assist      Assist level: Minimal Assistance - Patient > 75% Assistive device: Walker-rolling Max distance: 12'   Walk 10 feet activity   Assist     Assist level: Minimal Assistance - Patient > 75% Assistive device: Walker-rolling   Walk 50 feet activity   Assist Walk 50 feet with 2 turns activity did not occur: Safety/medical concerns  Assist level: Minimal Assistance - Patient > 75% Assistive device: Walker-rolling    Walk 150 feet  activity   Assist Walk 150 feet activity did not occur: Safety/medical concerns         Walk 10 feet on uneven surface  activity   Assist Walk 10 feet on uneven surfaces activity did not occur: Safety/medical concerns         Wheelchair     Assist Is the patient using a wheelchair?: Yes Type of Wheelchair: Manual    Wheelchair assist level: Maximal Assistance - Patient 25 - 49% Max wheelchair distance: 20    Wheelchair 50 feet with 2 turns activity    Assist        Assist Level: Maximal Assistance - Patient 25 - 49% (per PT documentation)   Wheelchair 150 feet activity     Assist      Assist Level: Total Assistance - Patient < 25% (per PT documentation)   Blood pressure 127/74, pulse 71, temperature 98.1 F (36.7 C), temperature source Oral, resp. rate 17, height 5' 7.01" (1.702 m), weight 100.7 kg, SpO2 99 %.  Medical Problem List and Plan: 1. 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  Antithrombotics: -DVT/anticoagulation:  Pharmaceutical: Heparin             -antiplatelet therapy: Aspirin 81 mg daily and Plavix 75 mg day x3 weeks  -now on Plavix alone    3. Pain Management: Tylenol as needed  -left shoulder improved although rom still limited  -continue aspercreme for left shoulder -can use muscle rub also   -reviewed stretches HE needs to work on while by himself in room    Subacromial left shoulder injection 1/31 with definite benefit 4. Mood and sleep/wake:      -now off ritalin             -antipsychotic agents: N/A 5. Neuropsych: This patient is capable of making decisions on his own behalf. 6. Skin/Wound Care: Routine skin checks 7.  Fluids/Electrolytes/Nutrition:    -BUN/Cr increased again while off IVF  -Resumed IVF  -2/16 labs improving but still not wnl   -continue hs ivf over weekend   -tried to quantify for the pt how much fluid he needs daily 8.  Diabetes mellitus.  Hemoglobin A1c 9.9.  NovoLog 3 units 3 times daily, Levemir  units  daily.    CBG (last 3)  Recent Labs    06/10/21 1635 06/10/21 2136 06/11/21 0554  GLUCAP 155* 225* 166*    2/16 cbg's improved so far with adjustment of  levemir to 12u qam and 12u qhs --no changes 9.  Hypertension.  Lisinopril 40 mg daily, Toprol-XL 50 mg daily, hydralazine 50 mg every 8 hours, Hygroton 25 mg daily.   Vitals:   06/11/21 0811 06/11/21 0818  BP: 127/74 127/74  Pulse: 71   Resp:    Temp:    SpO2:     2/16 controlled     hydralazine 50mg  qid   reduced toprol to 50mg  daily d/t bradycardia   clonidine to 0.2 mg  tid 10.  Hyperlipidemia.  continue Lipitor 11.  History of gout.  continue Allopurinol.  Monitor for any gout flareups   13.  Aortic stenosis- OP f/u with cardiology     LOS: 33 days A FACE TO [place] 06/11/2021, 9:59 AM

## 2021-06-11 NOTE — Progress Notes (Signed)
Recreational Therapy Session Note  Patient Details  Name: Manuel Chapman MRN: [id number] Date of Birth: 1946-01-22 Today's Date: 06/11/2021  Pain: no c/o Skilled Therapeutic Interventions/Progress Updates: Met with pt at bedside today to provide emotional support. Pt declined OOB activity but quickly engaged in conversation with LRT discussing current health status, lack of resources, lack offamily support and its impact on him and his recovery.  Pt acknowledges frustration with discharge planning and lack of family support but states he is taking things day by day.  Pt appreciative of this visit and having someone to listen to him.  Therapy/Group: Individual Therapy Caeleigh Prohaska 06/11/2021, 4:23 PM

## 2021-06-12 DIAGNOSIS — R7989 Other specified abnormal findings of blood chemistry: Secondary | ICD-10-CM

## 2021-06-12 LAB — GLUCOSE, CAPILLARY
Glucose-Capillary: 110 mg/dL — ABNORMAL HIGH (ref 70–99)
Glucose-Capillary: 152 mg/dL — ABNORMAL HIGH (ref 70–99)
Glucose-Capillary: 189 mg/dL — ABNORMAL HIGH (ref 70–99)
Glucose-Capillary: 161 mg/dL — ABNORMAL HIGH (ref 70–99)

## 2021-06-12 NOTE — Progress Notes (Signed)
Physical Therapy Session Note  Patient Details  Name: Manuel Chapman MRN: [id number] Date of Birth: [birth year]  Short Term Goals: Week 4:  PT Short Term Goal 1 (Week 4): STGs = LTGs  Skilled Therapeutic Interventions/Progress Updates:     Pt received seated in recliner and agrees to therapy. No complaint of pain. Pt performs stand step transfer to [facility] with RW and cues for positioning. WC transport to end of 5N walkway for time management and energy conservation. PT provides cueing to pt to ambulate "as fast as possible to back to room", and pt is able to repeat PT cues. Pt ambulates x440' back to room with RW and close supervision, with cues to improve upright gaze to promote posture and balance, and increasing gait speed, as pt does appear to be giving full effort. Pt states that he has been "going fast for the past 40 years and [has] earned the right to go slow". Pt takes 10 minutes to ambulate above distance. Average gait speed .73 feet per second, indicating pt is at risk for falls. Pt left seated in recliner with alarm intact and all needs within reach.  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 Restrictions Weight Bearing Restrictions: No    Therapy/Group: Individual Therapy  [name], PT, DPT 06/12/2021, 10:39 AM

## 2021-06-12 NOTE — Progress Notes (Signed)
Occupational Therapy Session Note  Patient Details  Name: Manuel Chapman MRN: [id number] Date of Birth: Dec 10, 1945  Today's Date: 06/12/2021 OT Individual Time: 0715-0730 OT Individual Time Calculation (min): 15 min    Short Term Goals: Week 1:  OT Short Term Goal 1 (Week 1): Patient will complete 1/3 parts of toileting task with Min A and LRAD. OT Short Term Goal 1 - Progress (Week 1): Progressing toward goal OT Short Term Goal 2 (Week 1): Patient will don UB clothing with set-up assist in sitting. OT Short Term Goal 2 - Progress (Week 1): Met OT Short Term Goal 3 (Week 1): Patient will don LB clothing with Min A and LRAD. OT Short Term Goal 3 - Progress (Week 1): Met OT Short Term Goal 4 (Week 1): Patient will complete toilet transfer with Min A and LRAD. OT Short Term Goal 4 - Progress (Week 1): Met  Skilled Therapeutic Interventions/Progress Updates:     Pt received in bed with no pain and agreeable to get up and move to the recliner. Supervision and max encouragement to complete bed mobiltiy with S using bed rail. Pt requires MOD A to don pants and total A todon socks as no AD present. Pt able to stand with supervision and CGA provided while pt advances pants past hips. Pt completes transfer to recliner with S and A to manage IV pole. Cuing for RW management and backing up to recliner.   Pt left at end of session in recliner with exit alarm on, call light in reach and all needs met   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 Restrictions Weight Bearing Restrictions: No   Therapy/Group: Individual Therapy  Tonny Branch 06/12/2021, 7:00 AM

## 2021-06-12 NOTE — Progress Notes (Signed)
Patient ID: Manuel Chapman, male   DOB: 03/13/1946, 76 y.o.   MRN: [id number]  SW received updates from family they are still waiting on forms to come in the mail. Reports they spoke with pension plan and was informed that the forms will be faxed as well and this can take up to 5 days.  *SW received updates stating form was faxed today. Pt SIL to check fax for information.   Cecile Sheerer, MSW, LCSWA Office: [phone] Cell: [phone] Fax: [phone]

## 2021-06-12 NOTE — Progress Notes (Signed)
PROGRESS NOTE   Subjective/Complaints: Pt without new issues  ROS: Patient denies fever, rash, sore throat, blurred vision, dizziness, nausea, vomiting, diarrhea, cough, shortness of breath or chest pain, joint or back/neck pain, headache, or mood change.   Objective:   No results found. No results for input(s): WBC, HGB, HCT, PLT in the last 72 hours.  Recent Labs    06/11/21 0552  NA 137  K 4.7  CL 107  CO2 23  GLUCOSE 165*  BUN 31*  CREATININE 0.90  CALCIUM 8.5*      Intake/Output Summary (Last 24 hours) at 06/12/2021 1118 Last data filed at 06/12/2021 0300 Gross per 24 hour  Intake 2537.03 ml  Output 1500 ml  Net 1037.03 ml         Physical Exam: Vital Signs Blood pressure (!) 156/71, pulse (!) 55, temperature 98.1 F (36.7 C), resp. rate 16, height 5' 7.01" (1.702 m), weight 100.7 kg, SpO2 100 %.   Constitutional: No distress . Vital signs reviewed. HEENT: NCAT, EOMI, oral membranes moist Neck: supple Cardiovascular: RRR without murmur. No JVD    Respiratory/Chest: CTA Bilaterally without wheezes or rales. Normal effort    GI/Abdomen: BS +, non-tender, non-distended Ext: no clubbing, cyanosis, or edema Psych: pleasant and cooperative  Skin: No evidence of breakdown, no evidence of rash  Neurologic: alert, oriented. Follows commands. Fair standing balance. Cranial nerves II through XII intact, motor strength is 5/5 in right deltoid, bicep, tricep, grip,  and bilateral hip flexor, knee extensors, ankle dorsiflexor and plantar flexor. Exam remains stable today.  Sensory exam normal for light touch and pain in all 4 limbs. No limb ataxia or cerebellar signs. No abnormal tone appreciated.   Musculoskeletal: improved functional use of left shoulder. Tolerates passive range much better--stable   Assessment/Plan: 1. Functional deficits which require 3+ hours per day of interdisciplinary therapy in a  comprehensive inpatient rehab setting. Physiatrist is providing close team supervision and 24 hour management of active medical problems listed below. Physiatrist and rehab team continue to assess barriers to discharge/monitor patient progress toward functional and medical goals  Care Tool:  Bathing    Body parts bathed by patient: Left arm, Chest, Abdomen, Front perineal area, Right upper leg, Face, Right arm, Left lower leg, Right lower leg, Left upper leg   Body parts bathed by helper: Right arm, Buttocks, Right lower leg, Left lower leg     Bathing assist Assist Level: Minimal Assistance - Patient > 75%     Upper Body Dressing/Undressing Upper body dressing   What is the patient wearing?: Pull over shirt    Upper body assist Assist Level: Set up assist    Lower Body Dressing/Undressing Lower body dressing      What is the patient wearing?: Pants     Lower body assist Assist for lower body dressing: Maximal Assistance - Patient 25 - 49% Assistive Device Comment: reacher   Toileting Toileting Toileting Activity did not occur Press photographer and hygiene only): Refused  Toileting assist Assist for toileting: Moderate Assistance - Patient 50 - 74%     Transfers Chair/bed transfer  Transfers assist  Chair/bed transfer activity did not occur: Safety/medical  concerns  Chair/bed transfer assist level: Contact Guard/Touching assist     Locomotion Ambulation   Ambulation assist      Assist level: Minimal Assistance - Patient > 75% Assistive device: Walker-rolling Max distance: 12'   Walk 10 feet activity   Assist     Assist level: Minimal Assistance - Patient > 75% Assistive device: Walker-rolling   Walk 50 feet activity   Assist Walk 50 feet with 2 turns activity did not occur: Safety/medical concerns  Assist level: Minimal Assistance - Patient > 75% Assistive device: Walker-rolling    Walk 150 feet activity   Assist Walk 150 feet  activity did not occur: Safety/medical concerns         Walk 10 feet on uneven surface  activity   Assist Walk 10 feet on uneven surfaces activity did not occur: Safety/medical concerns         Wheelchair     Assist Is the patient using a wheelchair?: Yes Type of Wheelchair: Manual    Wheelchair assist level: Maximal Assistance - Patient 25 - 49% Max wheelchair distance: 20    Wheelchair 50 feet with 2 turns activity    Assist        Assist Level: Maximal Assistance - Patient 25 - 49% (per PT documentation)   Wheelchair 150 feet activity     Assist      Assist Level: Total Assistance - Patient < 25% (per PT documentation)   Blood pressure (!) 156/71, pulse (!) 55, temperature 98.1 F (36.7 C), resp. rate 16, height 5' 7.01" (1.702 m), weight 100.7 kg, SpO2 100 %.  Medical Problem List and Plan: 1. 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  Antithrombotics: -DVT/anticoagulation:  Pharmaceutical: Heparin             -antiplatelet therapy: Aspirin 81 mg daily and Plavix 75 mg day x3 weeks  -now on Plavix alone    3. Pain Management: Tylenol as needed  -left shoulder improved although rom still limited  -continue aspercreme for left shoulder -can use muscle rub also   -reviewed stretches HE needs to work on while by himself in room    Subacromial left shoulder injection 1/31 with definite benefit 4. Mood and sleep/wake:      -now off ritalin             -antipsychotic agents: N/A 5. Neuropsych: This patient is capable of making decisions on his own behalf. 6. Skin/Wound Care: Routine skin checks 7. Fluids/Electrolytes/Nutrition:    -BUN/Cr increased again  while off IVF  -Resumed IVF  -2/17 labs improving but still not wnl   -continue hs ivf over weekend   -have tried to quantify for the pt how much fluid he needs daily   -recheck bmet 2/20 8.  Diabetes mellitus.  Hemoglobin A1c 9.9.  NovoLog 3 units 3 times daily, Levemir  units  daily.    CBG (last 3)  Recent Labs    06/11/21 1637 06/11/21 2130 06/12/21 0611  GLUCAP 129* 152* 110*    2/17 cbg's improved so far with adjustment of levemir to 12u qam and  12u qhs --no changes 9.  Hypertension.  Lisinopril 40 mg daily, Toprol-XL 50 mg daily, hydralazine 50 mg every 8 hours, Hygroton 25 mg daily.   Vitals:   06/11/21 2042 06/12/21 0418  BP: (!) 152/54 (!) 156/71  Pulse: (!) 59 (!) 55  Resp: 18 16  Temp: 98.2 F (36.8 C) 98.1 F (36.7 C)  SpO2: 99% 100%   2/17 borderline controlled     hydralazine 50mg  qid   reduced toprol to 50mg  daily d/t bradycardia   clonidine to 0.2 mg  tid 10.  Hyperlipidemia.  continue Lipitor 11.  History of gout.  continue Allopurinol.  Monitor for any gout flareups   13.  Aortic stenosis- OP f/u with cardiology     LOS: 34 days A FACE TO FACE EVALUATION WAS PERFORMED  Ranelle Oyster 06/12/2021, 11:18 AM

## 2021-06-12 NOTE — Progress Notes (Signed)
Recreational Therapy Session Note  Patient Details  Name: Manuel Chapman MRN: [id number] Date of Birth: Jul 27, 1945 Today's Date: 06/12/2021  Pain: no c/o Skilled Therapeutic Interventions/Progress Updates: Attempted out of the room activity and pt politely declined stating "I'm ok, I just want to sit here and relax.  I'm fine."  Pt reported room phone broken and not working, NT in to replace it.  Provided pt with notebook, pen and word puzzles for in room use.  Pt appreciative.  Manuel Chapman 06/12/2021, 12:45 PM

## 2021-06-13 LAB — GLUCOSE, CAPILLARY
Glucose-Capillary: 148 mg/dL — ABNORMAL HIGH (ref 70–99)
Glucose-Capillary: 100 mg/dL — ABNORMAL HIGH (ref 70–99)
Glucose-Capillary: 209 mg/dL — ABNORMAL HIGH (ref 70–99)
Glucose-Capillary: 323 mg/dL — ABNORMAL HIGH (ref 70–99)

## 2021-06-14 LAB — GLUCOSE, CAPILLARY
Glucose-Capillary: 108 mg/dL — ABNORMAL HIGH (ref 70–99)
Glucose-Capillary: 187 mg/dL — ABNORMAL HIGH (ref 70–99)
Glucose-Capillary: 106 mg/dL — ABNORMAL HIGH (ref 70–99)
Glucose-Capillary: 176 mg/dL — ABNORMAL HIGH (ref 70–99)

## 2021-06-15 LAB — GLUCOSE, CAPILLARY
Glucose-Capillary: 168 mg/dL — ABNORMAL HIGH (ref 70–99)
Glucose-Capillary: 143 mg/dL — ABNORMAL HIGH (ref 70–99)
Glucose-Capillary: 157 mg/dL — ABNORMAL HIGH (ref 70–99)
Glucose-Capillary: 123 mg/dL — ABNORMAL HIGH (ref 70–99)

## 2021-06-15 MED ORDER — METOPROLOL SUCCINATE ER 25 MG PO TB24
25.0000 mg | ORAL_TABLET | Freq: Every day | ORAL | Status: DC
  Administered 2021-06-16 – 2021-07-02 (×17): 25 mg via ORAL
  Filled 2021-06-15 (×17): qty 1

## 2021-06-15 MED ORDER — HYDRALAZINE HCL 50 MG PO TABS
75.0000 mg | ORAL_TABLET | Freq: Four times a day (QID) | ORAL | Status: DC
  Administered 2021-06-15 – 2021-06-30 (×57): 75 mg via ORAL
  Filled 2021-06-15 (×56): qty 1

## 2021-06-15 NOTE — Progress Notes (Signed)
PROGRESS NOTE   Subjective/Complaints: No new complaints this morning Needs to use bathroom and Loraine Leriche is assisting.   ROS: Patient denieafever, rash, sore throat, blurred vision, dizziness, nausea, vomiting, diarrhea, cough, shortness of breath or chest pain, joint or back/neck pain, headache, or mood change.   Objective:   No results found. No results for input(s): WBC, HGB, HCT, PLT in the last 72 hours.  No results for input(s): NA, K, CL, CO2, GLUCOSE, BUN, CREATININE, CALCIUM in the last 72 hours.     Intake/Output Summary (Last 24 hours) at 06/15/2021 1731 Last data filed at 06/15/2021 1300 Gross per 24 hour  Intake 771 ml  Output 1400 ml  Net -629 ml         Physical Exam: Vital Signs Blood pressure (!) 174/64, pulse (!) 58, temperature 98 F (36.7 C), temperature source Oral, resp. rate 18, height 5' 7.01" (1.702 m), weight 100.7 kg, SpO2 100 %.   Constitutional: No distress . Vital signs reviewed. HEENT: NCAT, EOMI, oral membranes moist Neck: supple Cardiovascular: Bradycardia    Respiratory/Chest: CTA Bilaterally without wheezes or rales. Normal effort    GI/Abdomen: BS +, non-tender, non-distended Ext: no clubbing, cyanosis, or edema Psych: pleasant and cooperative  Skin: No evidence of breakdown, no evidence of rash  Neurologic: alert, oriented. Follows commands. Fair standing balance. Cranial nerves II through XII intact, motor strength is 5/5 in right deltoid, bicep, tricep, grip,  and bilateral hip flexor, knee extensors, ankle dorsiflexor and plantar flexor. Exam remains stable today.  Sensory exam normal for light touch and pain in all 4 limbs. No limb ataxia or cerebellar signs. No abnormal tone appreciated.   Musculoskeletal: improved functional use of left shoulder. Tolerates passive range much better--stable   Assessment/Plan: 1. Functional deficits which require 3+ hours per day of  interdisciplinary therapy in a comprehensive inpatient rehab setting. Physiatrist is providing close team supervision and 24 hour management of active medical problems listed below. Physiatrist and rehab team continue to assess barriers to discharge/monitor patient progress toward functional and medical goals  Care Tool:  Bathing    Body parts bathed by patient: Left arm, Chest, Abdomen, Front perineal area, Right upper leg, Face, Right arm, Left lower leg, Right lower leg, Left upper leg   Body parts bathed by helper: Right arm, Buttocks, Right lower leg, Left lower leg     Bathing assist Assist Level: Minimal Assistance - Patient > 75%     Upper Body Dressing/Undressing Upper body dressing   What is the patient wearing?: Pull over shirt    Upper body assist Assist Level: Set up assist    Lower Body Dressing/Undressing Lower body dressing      What is the patient wearing?: Pants     Lower body assist Assist for lower body dressing: Maximal Assistance - Patient 25 - 49% Assistive Device Comment: reacher   Toileting Toileting Toileting Activity did not occur Press photographer and hygiene only): Refused  Toileting assist Assist for toileting: Moderate Assistance - Patient 50 - 74%     Transfers Chair/bed transfer  Transfers assist  Chair/bed transfer activity did not occur: Safety/medical concerns  Chair/bed transfer assist level: Contact  Guard/Touching assist     Locomotion Ambulation   Ambulation assist      Assist level: Minimal Assistance - Patient > 75% Assistive device: Walker-rolling Max distance: 12'   Walk 10 feet activity   Assist     Assist level: Minimal Assistance - Patient > 75% Assistive device: Walker-rolling   Walk 50 feet activity   Assist Walk 50 feet with 2 turns activity did not occur: Safety/medical concerns  Assist level: Minimal Assistance - Patient > 75% Assistive device: Walker-rolling    Walk 150 feet  activity   Assist Walk 150 feet activity did not occur: Safety/medical concerns         Walk 10 feet on uneven surface  activity   Assist Walk 10 feet on uneven surfaces activity did not occur: Safety/medical concerns         Wheelchair     Assist Is the patient using a wheelchair?: Yes Type of Wheelchair: Manual    Wheelchair assist level: Maximal Assistance - Patient 25 - 49% Max wheelchair distance: 20    Wheelchair 50 feet with 2 turns activity    Assist        Assist Level: Maximal Assistance - Patient 25 - 49% (per PT documentation)   Wheelchair 150 feet activity     Assist      Assist Level: Total Assistance - Patient < 25% (per PT documentation)   Blood pressure (!) 174/64, pulse (!) 58, temperature 98 F (36.7 C), temperature source Oral, resp. rate 18, height 5' 7.01" (1.702 m), weight 100.7 kg, SpO2 100 %.  Medical Problem List and Plan: 1. 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  Antithrombotics: -DVT/anticoagulation:  Pharmaceutical: Heparin             -antiplatelet therapy: Aspirin 81 mg daily and Plavix 75 mg day x3 weeks  -now on Plavix alone    3. Pain Management: Tylenol as needed  -left shoulder improved although rom still limited  -continue aspercreme for left shoulder -can use muscle rub also   -reviewed stretches HE needs to work on while by himself in room    Subacromial left shoulder injection 1/31 with definite benefit 4. Mood and sleep/wake:      -now off ritalin             -antipsychotic agents: N/A 5. Neuropsych: This patient is capable of making decisions on his own behalf. 6. Skin/Wound Care: Routine skin checks 7.  Fluids/Electrolytes/Nutrition:    -BUN/Cr increased again while off IVF  -Resumed IVF  -2/17 labs improving but still not wnl   -continue hs ivf over weekend   -have tried to quantify for the pt how much fluid he needs daily   -recheck bmet 2/20 8.  Diabetes mellitus.  Hemoglobin A1c 9.9.  NovoLog 3 units 3 times daily, Levemir  units  daily.    CBG (last 3)  Recent Labs    06/15/21 0641 06/15/21 1127 06/15/21 1718  GLUCAP 168* 143* 157*    2/17 cbg's improved so far with adjustment of levemir to 12u qam and 12u qhs --no changes  9.  Hypertension.  Lisinopril 40 mg daily, Toprol-XL 50 mg daily, hydralazine 50 mg every 8 hours, Hygroton 25 mg daily.   Vitals:   06/15/21 0528 06/15/21 1345  BP: (!) 151/46 (!) 174/64  Pulse: (!) 57 (!) 58  Resp: 16 18  Temp: 98.1 F (36.7 C) 98 F (36.7 C)  SpO2: 100% 100%   Increase hydralazine to 75mg  TID   clonidine to 0.2 mg  tid 10.  Hyperlipidemia.  continue Lipitor 11.  History of gout.  continue Allopurinol.  Monitor for any gout flareups   13.  Aortic stenosis- OP f/u with cardiology  14. Bradycardia: decrease lopressor to 25mg .     LOS: 37 days A FACE TO FACE EVALUATION WAS PERFORMED  06/15/2021, 5:31 PM

## 2021-06-15 NOTE — Progress Notes (Signed)
Occupational Therapy Session Note  Patient Details  Name: Manuel Chapman MRN: [id number] Date of Birth: April 13, 1946  Today's Date: 06/15/2021 OT Individual Time: 8675-1982 OT Individual Time Calculation (min): 25 min    Short Term Goals: Week 1:  OT Short Term Goal 1 (Week 1): Patient will complete 1/3 parts of toileting task with Min A and LRAD. OT Short Term Goal 1 - Progress (Week 1): Progressing toward goal OT Short Term Goal 2 (Week 1): Patient will don UB clothing with set-up assist in sitting. OT Short Term Goal 2 - Progress (Week 1): Met OT Short Term Goal 3 (Week 1): Patient will don LB clothing with Min A and LRAD. OT Short Term Goal 3 - Progress (Week 1): Met OT Short Term Goal 4 (Week 1): Patient will complete toilet transfer with Min A and LRAD. OT Short Term Goal 4 - Progress (Week 1): Met  Skilled Therapeutic Interventions/Progress Updates:     Pt received in bed initially with breakfast tray requesting OT to return when done with breakfast. Returned 20 min later and pt ready to wash up and dress. No pain.  ADL: Pt completes ADL at overall CGA Level. Pt and bed soaked in urine and feces. Pt with cups with straws despite SLP sign stating no straws. Removed straws Skilled interventions include: VC for technique to push up to sitting EOB, VC for anterior weight shift to scoot to EOB, CGA for standing balance while OT thoroughly washes bottom. LHSS used for pt to wash feet. Pt dons pants seated on recliner with reacher and CGA.  Pt left at end of session in recliner with exit alarm on, call light in reach and all needs met   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 Restrictions Weight Bearing Restrictions: No   Therapy/Group: Individual Therapy  Tonny Branch 06/15/2021, 8:48 AM

## 2021-06-15 NOTE — Progress Notes (Signed)
Physical Therapy Weekly Progress Note  Patient Details  Name: Manuel Chapman MRN: [id number] Date of Birth: 16-Jul-1945  Beginning of progress report period: June 05, 2021 End of progress report period: June 15, 2021  Today's Date: 06/15/2021 PT Individual Time: 1305-1330 PT Individual Time Calculation (min): 25 min   Patient has met 0 of 0 short term goals. Pt is maintaining mobility at a consistent level since prior weekly note. Goals not set due to pt LOS being extended. Pt continues to mobilize at a CGA to supervision level, and is minimally receptive to feedback on performance to improve mobility. Pt ambulates >400' with RW and supervision, but ambulates extremely slowly and continues to be a fall risk.  Patient continues to demonstrate the following deficits muscle weakness, decreased cardiorespiratoy endurance, decreased attention to left, and decreased standing balance, decreased postural control, hemiplegia, and decreased balance strategies and therefore will continue to benefit from skilled PT intervention to increase functional independence with mobility.  Patient progressing toward long term goals..  Continue plan of care.  Week 4:  PT Short Term Goal 1 (Week 4): STGs = LTGs Week 5:  PT Short Term Goal 1 (Week 5): STGs = LTGs  Skilled Therapeutic Interventions/Progress Updates:     Pt received supine in bed and agrees to therapy. No complaint of pain. Supine to sit with cues for sequencing and positioning. Pt performs sit to stand from slightly elevated bed after pt is unable to complete from standard bed height without assistance. Once standing, pt noted to be lacking brief and pants are soiled. Pt doffs pants independently and PT assists with donning clean brief and gown. WC transport to [employer] for time [employer]. Pt ambulates x250' with RW and cues for upright gaze to improve posture and balance, and increasing gait speed to decrease risk for falls. WC  transport back to room. Stand step transfer to recliner. Left seated with all needs within reach.  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 Restrictions Weight Bearing Restrictions: No   Therapy/Group: Individual Therapy  Breck Coons, PT, DPT 06/15/2021, 3:12 PM

## 2021-06-16 LAB — GLUCOSE, CAPILLARY
Glucose-Capillary: 104 mg/dL — ABNORMAL HIGH (ref 70–99)
Glucose-Capillary: 97 mg/dL (ref 70–99)
Glucose-Capillary: 124 mg/dL — ABNORMAL HIGH (ref 70–99)
Glucose-Capillary: 174 mg/dL — ABNORMAL HIGH (ref 70–99)

## 2021-06-16 NOTE — Progress Notes (Signed)
PROGRESS NOTE   Subjective/Complaints:    ROS: Patient without CP, SOB, N/V/D  Objective:   No results found. No results for input(s): WBC, HGB, HCT, PLT in the last 72 hours.  No results for input(s): NA, K, CL, CO2, GLUCOSE, BUN, CREATININE, CALCIUM in the last 72 hours.     Intake/Output Summary (Last 24 hours) at 06/16/2021 0856 Last data filed at 06/16/2021 0547 Gross per 24 hour  Intake 411 ml  Output 1475 ml  Net -1064 ml          Physical Exam: Vital Signs Blood pressure (!) 169/56, pulse (!) 55, temperature 97.7 F (36.5 C), temperature source Oral, resp. rate 20, height 5' 7.01" (1.702 m), weight 100.7 kg, SpO2 100 %.    General: No acute distress Mood and affect are appropriate Heart: Regular rate and rhythm no rubs, 3/6 SEM Lungs: Clear to auscultation, breathing unlabored, no rales or wheezes Abdomen: Positive bowel sounds, soft nontender to palpation, nondistended Extremities: No clubbing, cyanosis, or edema Skin: No evidence of breakdown, no evidence of rash   Neurologic: alert, oriented. Follows commands. Fair standing balance. Cranial nerves II through XII intact, motor strength is 5/5 in right deltoid, bicep, tricep, grip,  and bilateral hip flexor, knee extensors, ankle dorsiflexor and plantar flexor. Exam remains stable today.  Sensory exam normal for light touch and pain in all 4 limbs. No limb ataxia or cerebellar signs. No abnormal tone appreciated.   Musculoskeletal: improved functional use of left shoulder. Tolerates passive range much better--stable   Assessment/Plan: 1. Functional deficits which require 3+ hours per day of interdisciplinary therapy in a comprehensive inpatient rehab setting. Physiatrist is providing close team supervision and 24 hour management of active medical problems listed below. Physiatrist and rehab team continue to assess barriers to discharge/monitor  patient progress toward functional and medical goals  Care Tool:  Bathing    Body parts bathed by patient: Left arm, Chest, Abdomen, Front perineal area, Right upper leg, Face, Right arm, Left lower leg, Right lower leg, Left upper leg   Body parts bathed by helper: Right arm, Buttocks, Right lower leg, Left lower leg     Bathing assist Assist Level: Minimal Assistance - Patient > 75%     Upper Body Dressing/Undressing Upper body dressing   What is the patient wearing?: Pull over shirt    Upper body assist Assist Level: Set up assist    Lower Body Dressing/Undressing Lower body dressing      What is the patient wearing?: Pants     Lower body assist Assist for lower body dressing: Maximal Assistance - Patient 25 - 49% Assistive Device Comment: reacher   Toileting Toileting Toileting Activity did not occur Press photographer and hygiene only): Refused  Toileting assist Assist for toileting: Moderate Assistance - Patient 50 - 74%     Transfers Chair/bed transfer  Transfers assist  Chair/bed transfer activity did not occur: Safety/medical concerns  Chair/bed transfer assist level: Contact Guard/Touching assist     Locomotion Ambulation   Ambulation assist      Assist level: Minimal Assistance - Patient > 75% Assistive device: Walker-rolling Max distance: 12'   Walk 10 feet  activity   Assist     Assist level: Minimal Assistance - Patient > 75% Assistive device: Walker-rolling   Walk 50 feet activity   Assist Walk 50 feet with 2 turns activity did not occur: Safety/medical concerns  Assist level: Minimal Assistance - Patient > 75% Assistive device: Walker-rolling    Walk 150 feet activity   Assist Walk 150 feet activity did not occur: Safety/medical concerns         Walk 10 feet on uneven surface  activity   Assist Walk 10 feet on uneven surfaces activity did not occur: Safety/medical concerns         Wheelchair     Assist  Is the patient using a wheelchair?: Yes Type of Wheelchair: Manual    Wheelchair assist level: Maximal Assistance - Patient 25 - 49% Max wheelchair distance: 20    Wheelchair 50 feet with 2 turns activity    Assist        Assist Level: Maximal Assistance - Patient 25 - 49% (per PT documentation)   Wheelchair 150 feet activity     Assist      Assist Level: Total Assistance - Patient < 25% (per PT documentation)   Blood pressure (!) 169/56, pulse (!) 55, temperature 97.7 F (36.5 C), temperature source Oral, resp. rate 20, height 5' 7.01" (1.702 m), weight 100.7 kg, SpO2 100 %.  Medical Problem List and Plan: 1. 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  Antithrombotics: -DVT/anticoagulation:  Pharmaceutical: Heparin             -antiplatelet therapy: Aspirin 81 mg daily and Plavix 75 mg day x3 weeks  -now on Plavix alone    3. Pain Management: Tylenol as needed  -left shoulder improved although rom still limited  -continue aspercreme for left shoulder -can use muscle rub also   -reviewed stretches HE needs to work on while by himself in room    Subacromial left shoulder injection 1/31 with definite benefit 4. Mood and sleep/wake:      -now off ritalin             -antipsychotic agents: N/A 5. Neuropsych: This patient is capable of making decisions on his own behalf. 6. Skin/Wound Care: Routine skin checks 7. Fluids/Electrolytes/Nutrition:    -BUN/Cr increased again while off IVF  -Resumed IVF      BMP Latest Ref Rng & Units 06/11/2021 06/08/2021 06/05/2021  Glucose 70 - 99 mg/dL 811(S) 315(X) 458(P)  BUN 8 - 23 mg/dL 92(T) 24(M) 62(M)  Creatinine 0.61 - 1.24 mg/dL 6.38  1.77 1.16  Sodium 135 - 145 mmol/L 137 139 136  Potassium 3.5 - 5.1 mmol/L 4.7 4.8 5.0  Chloride 98 - 111 mmol/L 107 109 106  CO2 22 - 32 mmol/L 23 24 24   Calcium 8.9 - 10.3 mg/dL 5.7(X) 0.3(Y) 3.3(X)  Repeat BMET in am   8.  Diabetes mellitus.  Hemoglobin A1c 9.9.  NovoLog 3 units 3 times daily, Levemir  units  daily.    CBG (last 3)  Recent Labs    06/15/21 1718 06/15/21 2135 06/16/21 0713  GLUCAP 157*  123* 104*     2/21 controlled  9.  Hypertension.  Lisinopril 40 mg daily, Toprol-XL 50 mg daily, hydralazine 50 mg every 8 hours, Hygroton 25 mg daily.   Vitals:   06/15/21 2103 06/16/21 0609  BP: (!) 146/54 (!) 169/56  Pulse: (!) 57 (!) 55  Resp: 16 20  Temp: 98.5 F (36.9 C) 97.7 F (36.5 C)  SpO2: 100% 100%   Increase hydralazine to 75mg  TID   clonidine to 0.2 mg  tid 10.  Hyperlipidemia.  continue Lipitor 11.  History of gout.  continue Allopurinol.  Monitor for any gout flareups   13.  Aortic stenosis- OP f/u with cardiology  14. Bradycardia: decrease lopressor to 25mg .     LOS: 38 days A FACE TO FACE EVALUATION WAS PERFORMED  06/16/2021, 8:56 AM

## 2021-06-16 NOTE — Progress Notes (Addendum)
Patient ID: Manuel Chapman, male   DOB: 08-03-1945, 76 y.o.   MRN: [id number]  06/12/21- Pt SIL received pension documents and will provide to Medicaid CM.   06/16/21- SW left message for SW with [facility]- Manuel Chapman (Aging and Adult services; [phone]) to inform on above, and asking if the form was received. SW waiting on follow-up.  *SW spoke with [place] who reports pension documentation received. States Medicaid SW needs FL2 with SNF name on it. SW explained barrier as no SNF for an FL2. Planned meeting tomorrow at 9:30am to discuss with her and Medicaid SW to explain the complexity of his case. SW will send out referral again to see if any SNF would be willing to accept with LTC Medicaid since insurance denied SNF placement. SW also shared [name] has [facility] on their wait list since there are no male LTC beds as they are unable to accept until he has LTC Medicaid.   Cecile Sheerer, MSW, LCSWA Office: [phone] Cell: [phone] Fax: [phone]

## 2021-06-16 NOTE — Progress Notes (Signed)
Recreational Therapy Session Note  Patient Details  Name: Karson Reede MRN: [id number] Date of Birth: 09/07/1945 Today's Date: 06/16/2021  Pain: no c/o Skilled Therapeutic Interventions/Progress Updates: Pt with c/o of little sleep last night due to nursing issues and declined to participate in therapy session.  Pt requesting to go to bed.  Pt ambulated from recliner to bed with contact guard assist.   Valerian Jewel 06/16/2021, 8:58 AM

## 2021-06-16 NOTE — Progress Notes (Signed)
Occupational Therapy Session Note  Patient Details  Name: Manuel Chapman MRN: [id number] Date of Birth: [birth year]  Today's Date: 06/16/2021 OT Individual Time: 1040-1104 OT Individual Time Calculation (min): 24 min    Short Term Goals: Week 5:  OT Short Term Goal 1 (Week 5): LTG=STG 2/2 ELOS   Skilled Therapeutic Interventions/Progress Updates:   Pt supine upon entry to room with no c/o pain. He was agreeable to in-room UE ADLs only initially. OT left room to gather items for session and upon reentry pt reported need to use the bathroom. He used the urinal supine first and voided 250 cc urine. He completed bed mobility with use of bed rails with (S). 2x attempts sit > stand but pt able to do so with (S) overall. CGA fading to supervision overall for ambulatory transfer into the bathroom with the RW. He required assist to pull down brief and then was able to lower slowly to low toilet. He voided large BM. Upon standing again pt reported he required total A for peri hygiene. Encouraged pt to try and wipe with his RUE and he barely even tried to reach and stated "I just can't". Asked if he feels like his RUE is different from PTA and he said no. He stated he could wipe before this CVA. Unwilling to attempt. Provided edu on barriers to d/c when he refuses to attempt self care. He was provided total A. Pt returned to the recliner and was left sitting up with all needs met, chair alarm set.   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 Restrictions Weight Bearing Restrictions: No  Therapy/Group: Individual Therapy  Curtis Sites 06/16/2021, 6:20 AM

## 2021-06-16 NOTE — Progress Notes (Signed)
Physical Therapy Session Note  Patient Details  Name: Manuel Chapman MRN: [id number] Date of Birth: 1945/12/04  Today's Date: 06/16/2021 PT Missed Time: 57 Minutes Missed Time Reason: Patient unwilling to participate;Patient fatigue  Short Term Goals: Week 5:  PT Short Term Goal 1 (Week 5): STGs = LTGs  Skilled Therapeutic Interventions/Progress Updates:     Pt refuses therapy due to fatigue, saying that "walking is out of the question today." PT will follow up as able.  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 Restrictions Weight Bearing Restrictions: No General: PT Amount of Missed Time (min): 30 Minutes PT Missed Treatment Reason: Patient unwilling to participate;Patient fatigue   Therapy/Group: Individual Therapy  Breck Coons, PT, DPT 06/16/2021, 4:00 PM

## 2021-06-16 NOTE — Patient Care Conference (Signed)
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 Sula Soda Social Worker Present: Cecile Sheerer, LCSWA Nurse Present: Kennyth Arnold, RN PT Present: Malachi Pro, PT OT Present: Blanch Media, OT SLP Present: Feliberto Gottron, SLP PPS Coordinator present : Fae Pippin, SLP     Current Status/Progress Goal Weekly Team Focus  Bowel/Bladder   Continent of both B/B can be incontinent. LBM 1/20  decrease incont. episodes  toilet q 2hr   Swallow/Nutrition/ Hydration             ADL's   Min A to wash buttocks, CGA/supervision for mobility, extremely inontinent and unaware  CGA  dc plannig, activity tolerance, self-care retraining   Mobility   supervision all mobility, ambulating up to 500' very slowly with RW  Supervision  L hemibody NMR, dynamic standing balance, strengthening   Communication             Safety/Cognition/ Behavioral Observations            Pain   no pain  remain pain free  assess pain q 4hr and prn   Skin   CDI  no new breakdown  assess skin q shift and prn     Discharge Planning:  Pt will d/c to SNF. SNF location pending. Pt declined by insurance for placement even after appeal at federal level. APS referral submitted, however, not accepted. Pt was assigned a placement SW instead. [facility]  SW working on SNF placement at this time, however, has to wait for LTC Medicaid to be processed first. LTC Medicaid is pending pension income and family waiting for this to arrive in the mail.   Team Discussion: Decreased Lopressor, continue insulin. Incontinent and unaware. No pain reported. Family received Pension form. No family will agree to take him. Q-day therapy.  Patient on target to meet rehab goals: yes, supervision goals. Contact guard/min assist mobility currently. Doesn't respond to cueing. At baseline.   *See Care Plan and progress notes for long and short-term goals.   Revisions to Treatment Plan:  Adjusting medications   Teaching Needs: Family education, bowel/bladder training, transfer/gait training, etc.  Current Barriers to Discharge: Lack of/limited family support and Insurance for SNF coverage  Possible Resolutions to Barriers: SNF placement     Medical Summary               I attest that I was present, lead the team conference, and concur with the assessment and plan of the team.   Tennis Must 06/16/2021, 1:39 PM

## 2021-06-17 LAB — BASIC METABOLIC PANEL
Anion gap: 5 (ref 5–15)
BUN: 32 mg/dL — ABNORMAL HIGH (ref 8–23)
CO2: 25 mmol/L (ref 22–32)
Calcium: 8.3 mg/dL — ABNORMAL LOW (ref 8.9–10.3)
Chloride: 107 mmol/L (ref 98–111)
Creatinine, Ser: 0.89 mg/dL (ref 0.61–1.24)
GFR, Estimated: >60 mL/min (ref >60)
Glucose, Bld: 145 mg/dL — ABNORMAL HIGH (ref 70–99)
Potassium: 4.5 mmol/L (ref 3.5–5.1)
Sodium: 137 mmol/L (ref 135–145)

## 2021-06-17 LAB — GLUCOSE, CAPILLARY
Glucose-Capillary: 131 mg/dL — ABNORMAL HIGH (ref 70–99)
Glucose-Capillary: 107 mg/dL — ABNORMAL HIGH (ref 70–99)
Glucose-Capillary: 125 mg/dL — ABNORMAL HIGH (ref 70–99)
Glucose-Capillary: 173 mg/dL — ABNORMAL HIGH (ref 70–99)

## 2021-06-17 NOTE — Progress Notes (Signed)
PROGRESS NOTE   Subjective/Complaints:  Patient states he is drinking better.  Reviewed basic metabolic package  ROS: Patient without CP, SOB, N/V/D  Objective:   No results found. No results for input(s): WBC, HGB, HCT, PLT in the last 72 hours.  Recent Labs    06/17/21 0607  NA 137  K 4.5  CL 107  CO2 25  GLUCOSE 145*  BUN 32*  CREATININE 0.89  CALCIUM 8.3*       Intake/Output Summary (Last 24 hours) at 06/17/2021 0950 Last data filed at 06/17/2021 0900 Gross per 24 hour  Intake 894 ml  Output 1300 ml  Net -406 ml          Physical Exam: Vital Signs Blood pressure (!) 175/64, pulse (!) 56, temperature 97.7 F (36.5 C), temperature source Oral, resp. rate 16, height 5' 7.01" (1.702 m), weight 100.7 kg, SpO2 100 %.    General: No acute distress Mood and affect are appropriate Heart: Regular rate and rhythm no rubs, 3/6 SEM Lungs: Clear to auscultation, breathing unlabored, no rales or wheezes Abdomen: Positive bowel sounds, soft nontender to palpation, nondistended Extremities: No clubbing, cyanosis, or edema Skin: No evidence of breakdown, no evidence of rash   Neurologic: alert, oriented. Follows commands. Fair standing balance. Cranial nerves II through XII intact, motor strength is 5/5 in right deltoid, bicep, tricep, grip,  and bilateral hip flexor, knee extensors, ankle dorsiflexor and plantar flexor. Exam remains stable today.  Sensory exam normal for light touch and pain in all 4 limbs. No limb ataxia or cerebellar signs. No abnormal tone appreciated.   Musculoskeletal: improved functional use of left shoulder. Tolerates passive range much better--stable   Assessment/Plan: 1. Functional deficits which require 3+ hours per day of interdisciplinary therapy in a comprehensive inpatient rehab setting. Physiatrist is providing close team supervision and 24 hour management of active medical  problems listed below. Physiatrist and rehab team continue to assess barriers to discharge/monitor patient progress toward functional and medical goals  Care Tool:  Bathing    Body parts bathed by patient: Left arm, Chest, Abdomen, Front perineal area, Right upper leg, Face, Right arm, Left lower leg, Right lower leg, Left upper leg   Body parts bathed by helper: Right arm, Buttocks, Right lower leg, Left lower leg     Bathing assist Assist Level: Minimal Assistance - Patient > 75%     Upper Body Dressing/Undressing Upper body dressing   What is the patient wearing?: Pull over shirt    Upper body assist Assist Level: Set up assist    Lower Body Dressing/Undressing Lower body dressing      What is the patient wearing?: Pants     Lower body assist Assist for lower body dressing: Maximal Assistance - Patient 25 - 49% Assistive Device Comment: reacher   Toileting Toileting Toileting Activity did not occur Landscape architect and hygiene only): Refused  Toileting assist Assist for toileting: Moderate Assistance - Patient 50 - 74%     Transfers Chair/bed transfer  Transfers assist  Chair/bed transfer activity did not occur: Safety/medical concerns  Chair/bed transfer assist level: Contact Guard/Touching assist     Locomotion Ambulation  Ambulation assist      Assist level: Minimal Assistance - Patient > 75% Assistive device: Walker-rolling Max distance: 12'   Walk 10 feet activity   Assist     Assist level: Minimal Assistance - Patient > 75% Assistive device: Walker-rolling   Walk 50 feet activity   Assist Walk 50 feet with 2 turns activity did not occur: Safety/medical concerns  Assist level: Minimal Assistance - Patient > 75% Assistive device: Walker-rolling    Walk 150 feet activity   Assist Walk 150 feet activity did not occur: Safety/medical concerns         Walk 10 feet on uneven surface  activity   Assist Walk 10 feet on  uneven surfaces activity did not occur: Safety/medical concerns         Wheelchair     Assist Is the patient using a wheelchair?: Yes Type of Wheelchair: Manual    Wheelchair assist level: Maximal Assistance - Patient 25 - 49% Max wheelchair distance: 20    Wheelchair 50 feet with 2 turns activity    Assist        Assist Level: Maximal Assistance - Patient 25 - 49% (per PT documentation)   Wheelchair 150 feet activity     Assist      Assist Level: Total Assistance - Patient < 25% (per PT documentation)   Blood pressure (!) 175/64, pulse (!) 56, temperature 97.7 F (36.5 C), temperature source Oral, resp. rate 16, height 5' 7.01" (1.702 m), weight 100.7 kg, SpO2 100 %.  Medical Problem List and Plan: 1. 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  Antithrombotics: -DVT/anticoagulation:  Pharmaceutical: Heparin             -antiplatelet therapy: Aspirin 81 mg daily and Plavix 75 mg day x3 weeks  -now on Plavix alone    3. Pain Management: Tylenol as needed  -left shoulder improved although rom still limited  -continue aspercreme for left shoulder -can use muscle rub also   -reviewed stretches HE needs to work on while by himself in room    Subacromial left shoulder injection 1/31 with definite benefit 4. Mood and sleep/wake:      -now off ritalin             -antipsychotic agents: N/A 5. Neuropsych: This patient is capable of making decisions on his own behalf. 6. Skin/Wound Care: Routine skin checks 7. Fluids/Electrolytes/Nutrition:    -BUN/Cr increased again while off IVF  Patient looks like he is at his baseline for renal function, urine output  increasing      BMP Latest Ref Rng & Units 06/17/2021 06/11/2021 06/08/2021  Glucose 70 - 99 mg/dL 145(H) 165(H) 138(H)  BUN 8 - 23 mg/dL 32(H) 31(H) 37(H)  Creatinine 0.61 - 1.24 mg/dL 0.89 0.90 1.02  Sodium 135 - 145 mmol/L 137 137 139  Potassium 3.5 - 5.1 mmol/L 4.5 4.7 4.8  Chloride 98 - 111 mmol/L 107 107 109  CO2 22 - 32 mmol/L 25 23 24   Calcium 8.9 - 10.3 mg/dL 8.3(L) 8.5(L) 8.7(L)  Repeat BMET in am   8.  Diabetes  mellitus.  Hemoglobin A1c 9.9.  NovoLog 3 units 3 times daily, Levemir  units  daily.    CBG (last 3)  Recent Labs    06/16/21 1631 06/16/21 2056 06/17/21 0604  GLUCAP 124* 174* 131*   2/22 controlled  9.  Hypertension.  Lisinopril 40 mg daily, Toprol-XL 50 mg daily, hydralazine 50 mg every 8 hours, Hygroton 25 mg daily.   Vitals:   06/16/21 2050 06/17/21 0332  BP: (!) 135/55 (!) 175/64  Pulse: 60 (!) 56  Resp:  16  Temp:  97.7 F (36.5 C)  SpO2:  100%   Increase hydralazine to 75mg  TID   clonidine to 0.2 mg  tid, will DC IV fluids renal status at baseline 10.  Hyperlipidemia.  continue Lipitor 11.  History of gout.  continue Allopurinol.  Monitor for any gout flareups   13.  Aortic stenosis- OP f/u with cardiology  14. Bradycardia: decrease lopressor to 25mg .     LOS: 39 days A FACE TO FACE EVALUATION WAS PERFORMED  Charlett Blake 06/17/2021, 9:50 AM

## 2021-06-17 NOTE — Progress Notes (Signed)
Occupational Therapy Session Note  Patient Details  Name: Khoury Siemon MRN: [id number] Date of Birth: 1945-07-08  Today's Date: 06/17/2021 OT Individual Time: 1010-1050 OT Individual Time Calculation (min): 40 min    Short Term Goals: Week 5:  OT Short Term Goal 1 (Week 5): LTG=STG 2/2 ELOS   Skilled Therapeutic Interventions/Progress Updates:    Pt received sitting in the recliner with no c/o pain. Much improved spirits/motivation today. He completed ambulatory transfer with (S) to the bathroom and completed toileting tasks in standing with (S). He returned to the w/c and was taken to the therapy gym. He completed UE endurance/strengthening activity- holding a 2lb dowel and dynamically hitting a ball thrown at him to challenge core stability as well. 2x20 repetitions. He then completed several core stability based exercises- trunk rotations with a 2lb ball and trunk flex/ext, 2x15 repetitions. Two sets. He returned to his w/c and was returned to his room. He transferred back to the recliner with (S). He was left sitting up with all needs met, chair alarm set.   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 Restrictions Weight Bearing Restrictions: No    Therapy/Group: Individual Therapy  Curtis Sites 06/17/2021, 6:47 AM

## 2021-06-17 NOTE — Progress Notes (Addendum)
Recreational Therapy Session Note  Patient Details  Name: Manuel Chapman MRN: [id number] Date of Birth: Aug 18, 1945 Today's Date: 06/17/2021  Pain: no c/o Skilled Therapeutic Interventions/Progress Updates:  Pt up in recliner this morning reporting a better nights sleep.  Up at 4.  Pt states "I knew you'd be here today.  I appreciate you checking on me."  Pt somewhat emotional stating that nursing staff ask repetitively day to day about when he is going home.  Pt reports this is upsetting for him as he would like to be home now.  Pt also reports being upset with family/friends due to their lack of support and the sale of his home.  Emotional support & encouragement provided.  Encouraged pt to participate in OT and PT today as he hasn't been as participatory the last couple days.    LRT communicated pt concerns to bedside RN who will share this information in shift reporting.  Pt acknowledges staff do not mean to upset him but he prefers they not bring up how long he's been here or asking him about when he is leaving.  Pt appreciative of the visit & support provided.  Therapy/Group: Individual Therapy  Hester Joslin 06/17/2021, 9:23 AM

## 2021-06-17 NOTE — Progress Notes (Signed)
Physical Therapy Session Note  Patient Details  Name: Marquie Aderhold MRN: [id number] Date of Birth: [birth year]  Today's Date: 06/17/2021 PT Individual Time: 1305-1330 PT Individual Time Calculation (min): 25 min   Short Term Goals: Week 5:  PT Short Term Goal 1 (Week 5): STGs = LTGs  Skilled Therapeutic Interventions/Progress Updates:     Pt received seated in recliner and agrees to therapy. No complaint of pain. Stand step transfer to [facility] with RW and setup assistance. WC transport to gym for time management. Stand step transfer to mat with RW. Pt participates in NMR for trunk control as well as L hemibody motor control. Platform placed between pt's legs and pt cues to reach forward to retrieve weighted balls with alternating hands. When reaching forward with R hand, PT cues pt to WB through L upper extremity for approximation and Nm feedback. Pt then alternates placing balls outside of base of support on either side of mat, leaning L and R and returning to midline with verbal cues for body mechanics.  Pt performs toss and catch activity in standing with trampoline to challenge dynamic balance without upper extremity support. Pt completes x30 with CGA and cues for posture.  Pt left seated with alarm intact and all needs within reach.  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 Restrictions Weight Bearing Restrictions: No   Therapy/Group: Individual Therapy  [name], PT, DPT 06/17/2021, 3:36 PM

## 2021-06-17 NOTE — Progress Notes (Signed)
Patient ID: Manuel Chapman, male   DOB: Aug 07, 1945, 76 y.o.   MRN: [id number]  SW spoke with DSS Placement SW Chastiny with [facility], and Medicaid SW Melissa to discuss patient current barriers. Melissa intends to explore if being here in the hospital is considered acute placement since he has been here greater than 30 days. Reports she can only follow-up with his family to provide updates.   SW spoke with pt dtr [name] and her husband on separate occassions to inform on above.   Cecile Sheerer, MSW, LCSWA Office: [phone] Cell: [phone] Fax: [phone]

## 2021-06-18 LAB — GLUCOSE, CAPILLARY
Glucose-Capillary: 121 mg/dL — ABNORMAL HIGH (ref 70–99)
Glucose-Capillary: 106 mg/dL — ABNORMAL HIGH (ref 70–99)
Glucose-Capillary: 157 mg/dL — ABNORMAL HIGH (ref 70–99)
Glucose-Capillary: 116 mg/dL — ABNORMAL HIGH (ref 70–99)

## 2021-06-18 NOTE — Progress Notes (Addendum)
Patient ID: Rutvik Reuter, male   DOB: May 04, 1945, 76 y.o.   MRN: [id number]  SW received updates LTC Medicaid SW Melissa Gentle indicating he was approved. Patient new CM will be- Ms. Octavio Graves [phone]; should be notified if there are any new changes with his case.   SW received same updates from pt SIL Vincent. He would like SW to try [facility] to see if there is bed available. SW spoke with Michelle/Admissions with [facility] who indicated they do not have any LTC male/male beds available. SW informed will send out referral again and see which SNFs are willing to extend bed offer.  SW sent out SNF referral. SW updated medical team.   *SW spoke with pt dtr [name] to discuss above.   Loralee Pacas, MSW, [place] Office: [phone] Cell: [phone] Fax: [phone]

## 2021-06-18 NOTE — Progress Notes (Signed)
Occupational Therapy Weekly Progress Note  Patient Details  Name: Manuel Chapman MRN: [id number] Date of Birth: Dec 14, 1945  Beginning of progress report period: May 10, 2021 End of progress report period: June 18, 2021  Today's Date: 06/18/2021 OT Individual Time: 1005-1045 OT Individual Time Calculation (min): 40 min   Patient has stayed the same functionally this week at a CGA/supervision level for sit<>stands and ambulatory transfers. Patient ambulates at a very slow pace, although he can walk distances, he is still a high fall risk due to poor safety awareness.  Pt requires also requires assistance for BADL tasks, which he was needing PTA. Pt needs assistance to wash buttocks due to body habitus which he needed at baseline. Pt continues to be extremely incontinent and unaware, making him a high risk for skin breakdown and overall poor hygiene. Recommendation remains for SNF level of care due to  functional deficits.   Patient continues to demonstrate the following deficits: muscle weakness, decreased cardiorespiratoy endurance, decreased safety awareness and decreased memory, and decreased standing balance and decreased balance strategies and therefore will continue to benefit from skilled OT intervention to enhance overall performance with BADL, iADL, and Reduce care partner burden.  Patient  Continue plan of care.  OT Short Term Goals Week 6:  OT Short Term Goal 1 (Week 6): LTG=STG 2.2 ELOS  Skilled Therapeutic Interventions/Progress Updates:    Pt greeted seated in recliner and agreeable to OT treatment session. PT reported already washing with nursing and declined to shower. Pt incontinent of urine and unaware, swearing up and down that his brief was clean. Pt is at a high risk for skin breakdown as he has no awareness to when he has used the bathroom and needs someone to make him change out of wet brief. OT issued [place] reacher and he was able to thread pant legs with  supervision. Attempted to work on standing balace/endurance when standing to wash peri-area, but he refused to stay standing and became agitated with OT for encouraging him to stay standing. Pt washed peri-area in sitting, but needed OT to wash buttocks as pt is resistant to try modified strategies to wash buttocks. Pt unable to wash buttocks at baseline due to body habitus. Pt took rest break, then ambulated in hallway with RW and close supervision at extremely slow pace w/ RW. Pt returned to room and left seated in recliner with alarm pad on and needs met.   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 Restrictions Weight Bearing Restrictions: No Pain: Denies pain  Therapy/Group: Individual Therapy  Valma Cava 06/18/2021, 3:54 PM

## 2021-06-19 LAB — GLUCOSE, CAPILLARY
Glucose-Capillary: 73 mg/dL (ref 70–99)
Glucose-Capillary: 82 mg/dL (ref 70–99)
Glucose-Capillary: 143 mg/dL — ABNORMAL HIGH (ref 70–99)
Glucose-Capillary: 201 mg/dL — ABNORMAL HIGH (ref 70–99)

## 2021-06-19 MED ORDER — INSULIN DETEMIR 100 UNIT/ML ~~LOC~~ SOLN
10.0000 [IU] | Freq: Every day | SUBCUTANEOUS | Status: DC
  Administered 2021-06-19 – 2021-07-01 (×12): 10 [IU] via SUBCUTANEOUS
  Filled 2021-06-19 (×17): qty 0.1

## 2021-06-19 MED ORDER — INSULIN DETEMIR 100 UNIT/ML ~~LOC~~ SOLN
10.0000 [IU] | Freq: Every day | SUBCUTANEOUS | Status: DC
  Administered 2021-06-20 – 2021-06-25 (×6): 10 [IU] via SUBCUTANEOUS
  Filled 2021-06-19 (×6): qty 0.1

## 2021-06-19 NOTE — Progress Notes (Signed)
Occupational Therapy Session Note  Patient Details  Name: Manuel Chapman MRN: [id number] Date of Birth: Aug 19, 1945  Today's Date: 06/19/2021 OT Missed Time: 40 Minutes Missed Time Reason: Patient unwilling/refused to participate without medical reason  Attempted to see pt for 45 min skilled OT, however pt sleeping and declined participating in any tx without medical reason. Will follow up per POC.  [name] 06/19/2021, 6:46 AM

## 2021-06-19 NOTE — Progress Notes (Addendum)
Physical Therapy Session Note  Patient Details  Name: Martine Trageser MRN: [id number] Date of Birth: [birth year]  Today's Date: 06/19/2021 PT Individual Time: 1401-1430 PT Individual Time Calculation (min): 29 min   Short Term Goals: Week 5:  PT Short Term Goal 1 (Week 5): STGs = LTGs  Skilled Therapeutic Interventions/Progress Updates: Pt presents supine in bed w/ nursing care.  Pt agrees to participate w/ therapy.  Pt transfers sup to sit w/ supervision, but slowly.  Pt transfers sit to stand w/ CGA to close supervision from bed and requires no verbal cues for sequencing.  Pt performed standing w/ eyes closed and head turns dide to side w/o LOB.  Pt amb x 2 w/ RW and supervision w/ occasional verbal cues for posture and walker management.  Pt amb 90' per trial including turn to return to seat.  Pt c/o fatigue today.  Pt remained sitting in recliner w/ seat alarm on and all needs in reach.     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 Restrictions Weight Bearing Restrictions: No General:   Vital Signs: Therapy Vitals Temp: 97.7 F (36.5 C) Pulse Rate: (!) 55 Resp: 17 BP: (!) 154/59 Patient Position (if appropriate): Lying Oxygen Therapy SpO2: 100 % O2 Device: Room Air Pain:0/10      Therapy/Group: Individual Therapy  Lucio Edward 06/19/2021, 2:31 PM

## 2021-06-19 NOTE — Progress Notes (Signed)
PROGRESS NOTE   Subjective/Complaints:  No issues overnite   ROS: Patient without CP, SOB, N/V/D  Objective:   No results found. No results for input(s): WBC, HGB, HCT, PLT in the last 72 hours.  Recent Labs    06/17/21 0607  NA 137  K 4.5  CL 107  CO2 25  GLUCOSE 145*  BUN 32*  CREATININE 0.89  CALCIUM 8.3*        Intake/Output Summary (Last 24 hours) at 06/19/2021 0840 Last data filed at 06/19/2021 0350 Gross per 24 hour  Intake 236 ml  Output 1475 ml  Net -1239 ml          Physical Exam: Vital Signs Blood pressure (!) 153/65, pulse (!) 57, temperature 97.8 F (36.6 C), resp. rate 17, height 5' 7.01" (1.702 m), weight 100.7 kg, SpO2 100 %.    General: No acute distress Mood and affect are appropriate Heart: Regular rate and rhythm no rubs, 3/6 SEM Lungs: Clear to auscultation, breathing unlabored, no rales or wheezes Abdomen: Positive bowel sounds, soft nontender to palpation, nondistended Extremities: No clubbing, cyanosis, or edema Skin: No evidence of breakdown, no evidence of rash   Neurologic: alert, oriented. Follows commands. Fair standing balance. Cranial nerves II through XII intact, motor strength is 5/5 in right deltoid, bicep, tricep, grip,  and bilateral hip flexor, knee extensors, ankle dorsiflexor and plantar flexor. Exam remains stable today.  Sensory exam normal for light touch and pain in all 4 limbs. No limb ataxia or cerebellar signs. No abnormal tone appreciated.   Musculoskeletal: improved functional use of left shoulder. Tolerates passive range much better--stable   Assessment/Plan: 1. Functional deficits which require 3+ hours per day of interdisciplinary therapy in a comprehensive inpatient rehab setting. Physiatrist is providing close team supervision and 24 hour management of active medical problems listed below. Physiatrist and rehab team continue to assess barriers  to discharge/monitor patient progress toward functional and medical goals  Care Tool:  Bathing    Body parts bathed by patient: Left arm, Chest, Abdomen, Front perineal area, Right upper leg, Face, Right arm, Left lower leg, Right lower leg, Left upper leg   Body parts bathed by helper: Right arm, Buttocks, Right lower leg, Left lower leg     Bathing assist Assist Level: Minimal Assistance - Patient > 75%     Upper Body Dressing/Undressing Upper body dressing   What is the patient wearing?: Pull over shirt    Upper body assist Assist Level: Set up assist    Lower Body Dressing/Undressing Lower body dressing      What is the patient wearing?: Pants     Lower body assist Assist for lower body dressing: Maximal Assistance - Patient 25 - 49% Assistive Device Comment: reacher   Toileting Toileting Toileting Activity did not occur Press photographer and hygiene only): Refused  Toileting assist Assist for toileting: Moderate Assistance - Patient 50 - 74%     Transfers Chair/bed transfer  Transfers assist  Chair/bed transfer activity did not occur: Safety/medical concerns  Chair/bed transfer assist level: Contact Guard/Touching assist     Locomotion Ambulation   Ambulation assist      Assist level:  Minimal Assistance - Patient > 75% Assistive device: Walker-rolling Max distance: 12'   Walk 10 feet activity   Assist     Assist level: Minimal Assistance - Patient > 75% Assistive device: Walker-rolling   Walk 50 feet activity   Assist Walk 50 feet with 2 turns activity did not occur: Safety/medical concerns  Assist level: Minimal Assistance - Patient > 75% Assistive device: Walker-rolling    Walk 150 feet activity   Assist Walk 150 feet activity did not occur: Safety/medical concerns         Walk 10 feet on uneven surface  activity   Assist Walk 10 feet on uneven surfaces activity did not occur: Safety/medical concerns          Wheelchair     Assist Is the patient using a wheelchair?: Yes Type of Wheelchair: Manual    Wheelchair assist level: Maximal Assistance - Patient 25 - 49% Max wheelchair distance: 20    Wheelchair 50 feet with 2 turns activity    Assist        Assist Level: Maximal Assistance - Patient 25 - 49% (per PT documentation)   Wheelchair 150 feet activity     Assist      Assist Level: Total Assistance - Patient < 25% (per PT documentation)   Blood pressure (!) 153/65, pulse (!) 57, temperature 97.8 F (36.6 C), resp. rate 17, height 5' 7.01" (1.702 m), weight 100.7 kg, SpO2 100 %.  Medical Problem List and Plan: 1. 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  Antithrombotics: -DVT/anticoagulation:  Pharmaceutical: Heparin             -antiplatelet therapy: Aspirin 81 mg daily and Plavix 75 mg day x3 weeks  -now on Plavix alone    3. Pain Management: Tylenol as needed  -left shoulder improved although rom still limited  -continue aspercreme for left shoulder -can use muscle rub also   -reviewed stretches HE needs to work on while by himself in room    Subacromial left shoulder injection 1/31 with definite benefit 4. Mood and sleep/wake:      -now off ritalin             -antipsychotic agents: N/A 5. Neuropsych: This patient is capable of making decisions on his own behalf. 6. Skin/Wound Care: Routine skin checks 7. Fluids/Electrolytes/Nutrition:      Patient looks like he is at his baseline for renal function, urine output increasing      BMP Latest Ref Rng & Units 06/17/2021 06/11/2021 06/08/2021  Glucose 70 - 99 mg/dL 021(J) 155(M) 080(E)  BUN 8 - 23 mg/dL 23(V) 61(Q) 24(S)   Creatinine 0.61 - 1.24 mg/dL 9.75 3.00 5.11  Sodium 135 - 145 mmol/L 137 137 139  Potassium 3.5 - 5.1 mmol/L 4.5 4.7 4.8  Chloride 98 - 111 mmol/L 107 107 109  CO2 22 - 32 mmol/L 25 23 24   Calcium 8.9 - 10.3 mg/dL 8.3(L) 8.5(L) 8.7(L)    8.  Diabetes mellitus.  Hemoglobin A1c 9.9.  NovoLog 3 units 3 times daily, Levemir  units  daily.    CBG (  last 3)  Recent Labs    06/18/21 1612 06/18/21 2102 06/19/21 0620  GLUCAP 157* 116* 73   2/22 controlled but am value low reduce levimir to 10U BID 9.  Hypertension.  Lisinopril 40 mg daily, Toprol-XL 50 mg daily, hydralazine 50 mg every 8 hours, Hygroton 25 mg daily.   Vitals:   06/18/21 2015 06/19/21 0326  BP: (!) 119/54 (!) 153/65  Pulse: (!) 58 (!) 57  Resp: 18 17  Temp: 98 F (36.7 C) 97.8 F (36.6 C)  SpO2: 100% 100%   Increase hydralazine to 75mg  TID   clonidine to 0.2 mg  tid, will DC IV fluids renal status at baseline 10.  Hyperlipidemia.  continue Lipitor 11.  History of gout.  continue Allopurinol.  Monitor for any gout flareups   13.  Aortic stenosis- OP f/u with cardiology  14. Bradycardia: decrease lopressor to 25mg .     LOS: 41 days A FACE TO FACE EVALUATION WAS PERFORMED  06/19/2021, 8:40 AM

## 2021-06-20 LAB — GLUCOSE, CAPILLARY
Glucose-Capillary: 126 mg/dL — ABNORMAL HIGH (ref 70–99)
Glucose-Capillary: 112 mg/dL — ABNORMAL HIGH (ref 70–99)
Glucose-Capillary: 234 mg/dL — ABNORMAL HIGH (ref 70–99)
Glucose-Capillary: 256 mg/dL — ABNORMAL HIGH (ref 70–99)

## 2021-06-21 DIAGNOSIS — I10 Essential (primary) hypertension: Secondary | ICD-10-CM

## 2021-06-21 DIAGNOSIS — E1165 Type 2 diabetes mellitus with hyperglycemia: Secondary | ICD-10-CM

## 2021-06-21 DIAGNOSIS — R7309 Other abnormal glucose: Secondary | ICD-10-CM

## 2021-06-21 LAB — GLUCOSE, CAPILLARY
Glucose-Capillary: 137 mg/dL — ABNORMAL HIGH (ref 70–99)
Glucose-Capillary: 123 mg/dL — ABNORMAL HIGH (ref 70–99)
Glucose-Capillary: 144 mg/dL — ABNORMAL HIGH (ref 70–99)
Glucose-Capillary: 124 mg/dL — ABNORMAL HIGH (ref 70–99)

## 2021-06-21 MED ORDER — INSULIN ASPART 100 UNIT/ML IJ SOLN
4.0000 [IU] | Freq: Three times a day (TID) | INTRAMUSCULAR | Status: DC
  Administered 2021-06-21 – 2021-07-02 (×33): 4 [IU] via SUBCUTANEOUS

## 2021-06-21 NOTE — Progress Notes (Signed)
PROGRESS NOTE   Subjective/Complaints: Patient seen sitting up in bed this morning.  He states he slept well overnight.  He denies complaints.  ROS: Patient without CP, SOB, N/V/D  Objective:   No results found. No results for input(s): WBC, HGB, HCT, PLT in the last 72 hours.  No results for input(s): NA, K, CL, CO2, GLUCOSE, BUN, CREATININE, CALCIUM in the last 72 hours.      Intake/Output Summary (Last 24 hours) at 06/21/2021 0848 Last data filed at 06/21/2021 [id number] Gross per 24 hour  Intake 840 ml  Output 1100 ml  Net -260 ml          Physical Exam: Vital Signs Blood pressure (!) 148/95, pulse (!) 55, temperature 98 F (36.7 C), temperature source Oral, resp. rate 18, height 5' 7.01" (1.702 m), weight 101.4 kg, SpO2 100 %. General: NAD. Mood and affect are appropriate Heart: Regular rate and rhythm no rubs, 3/6 SEM Lungs: Clear to auscultation, breathing unlabored, no rales or wheezes Abdomen: Positive bowel sounds, soft nontender to palpation, nondistended Extremities: No clubbing, cyanosis Skin: No evidence of breakdown, no evidence of rash Neurologic: Alert and oriented Follows commands.  Motor: Grossly intact   Assessment/Plan: 1. Functional deficits which require 3+ hours per day of interdisciplinary therapy in a comprehensive inpatient rehab setting. Physiatrist is providing close team supervision and 24 hour management of active medical problems listed below. Physiatrist and rehab team continue to assess barriers to discharge/monitor patient progress toward functional and medical goals  Care Tool:  Bathing    Body parts bathed by patient: Left arm, Chest, Abdomen, Front perineal area, Right upper leg, Face, Right arm, Left lower leg, Right lower leg, Left upper leg   Body parts bathed by helper: Right arm, Buttocks, Right lower leg, Left lower leg     Bathing assist Assist Level: Minimal  Assistance - Patient > 75%     Upper Body Dressing/Undressing Upper body dressing   What is the patient wearing?: Pull over shirt    Upper body assist Assist Level: Set up assist    Lower Body Dressing/Undressing Lower body dressing      What is the patient wearing?: Pants     Lower body assist Assist for lower body dressing: Maximal Assistance - Patient 25 - 49% Assistive Device Comment: reacher   Toileting Toileting Toileting Activity did not occur Landscape architect and hygiene only): Refused  Toileting assist Assist for toileting: Moderate Assistance - Patient 50 - 74%     Transfers Chair/bed transfer  Transfers assist  Chair/bed transfer activity did not occur: Safety/medical concerns  Chair/bed transfer assist level: Contact Guard/Touching assist     Locomotion Ambulation   Ambulation assist      Assist level: Minimal Assistance - Patient > 75% Assistive device: Walker-rolling Max distance: 12'   Walk 10 feet activity   Assist     Assist level: Minimal Assistance - Patient > 75% Assistive device: Walker-rolling   Walk 50 feet activity   Assist Walk 50 feet with 2 turns activity did not occur: Safety/medical concerns  Assist level: Minimal Assistance - Patient > 75% Assistive device: Walker-rolling    Walk 150 feet  activity   Assist Walk 150 feet activity did not occur: Safety/medical concerns         Walk 10 feet on uneven surface  activity   Assist Walk 10 feet on uneven surfaces activity did not occur: Safety/medical concerns         Wheelchair     Assist Is the patient using a wheelchair?: Yes Type of Wheelchair: Manual    Wheelchair assist level: Maximal Assistance - Patient 25 - 49% Max wheelchair distance: 20    Wheelchair 50 feet with 2 turns activity    Assist        Assist Level: Maximal Assistance - Patient 25 - 49% (per PT documentation)   Wheelchair 150 feet activity     Assist       Assist Level: Total Assistance - Patient < 25% (per PT documentation)   Blood pressure (!) 148/95, pulse (!) 55, temperature 98 F (36.7 C), temperature source Oral, resp. rate 18, height 5' 7.01" (1.702 m), weight 101.4 kg, SpO2 100 %.  Medical Problem List and Plan: 1. 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  Antithrombotics: -DVT/anticoagulation:  Pharmaceutical: Heparin  CBC ordered for tomorrow             -antiplatelet therapy: Aspirin 81 mg daily and Plavix 75 mg day x3 weeks  -now on Plavix alone    3. Pain Management: Tylenol as needed -continue aspercreme for left shoulder -can use muscle rub also   -reviewed stretches HE needs to work on while by himself in room    Subacromial left shoulder injection 1/31 with benefit 4. Mood and sleep/wake:      -now off ritalin             -antipsychotic agents: N/A 5. Neuropsych: This patient is capable of making decisions on his own behalf. 6. Skin/Wound Care: Routine skin checks 7. Fluids/Electrolytes/Nutrition:      Patient looks like he is at his baseline for renal function, urine output increasing      BMP Latest Ref Rng & Units 06/17/2021 06/11/2021 06/08/2021  Glucose 70 - 99 mg/dL 145(H) 165(H) 138(H)  BUN 8 - 23 mg/dL 32(H) 31(H) 37(H)  Creatinine 0.61 - 1.24 mg/dL 0.89 0.90 1.02  Sodium 135 - 145 mmol/L 137 137 139  Potassium 3.5 - 5.1 mmol/L 4.5 4.7 4.8  Chloride 98 - 111 mmol/L 107 107 109  CO2 22 - 32 mmol/L 25 23 24   Calcium 8.9 - 10.3 mg/dL 8.3(L) 8.5(L) 8.7(L)     8.  Uncontrolled diabetes mellitus with hyperglycemia.  Hemoglobin A1c 9.9.   NovoLog 3 units 3 times daily, increased to 4 3 times daily on 2/26 CBG (last 3)  Recent Labs    06/20/21 1642 06/20/21 2028 06/21/21 0600  GLUCAP 234*  256* 137*   2/22 controlled but am value low reduce levimir to 10U BID [place] on 2/26 9.  Hypertension.  Lisinopril 40 mg daily, Toprol-XL 50 mg daily, hydralazine 50 mg every 8 hours, Hygroton 25 mg daily.   Vitals:   06/20/21 1935 06/21/21 0523  BP: (!) 139/53 (!) 148/95  Pulse: 63 (!) 55  Resp: 18 18  Temp: 98.4 F (36.9 C) 98 F (36.7 C)  SpO2:  100% 100%   Increase hydralazine to 75mg  TID   clonidine to 0.2 mg  tid, will DC IV fluids renal status at baseline  Controlled for the most part on 2/26 10.  Hyperlipidemia.  continue Lipitor 11.  History of gout.  continue Allopurinol.  Monitor for any gout flareups   13.  Aortic stenosis- OP f/u with cardiology  14. Bradycardia: decrease lopressor to 25mg .     LOS: 43 days A FACE TO FACE EVALUATION WAS PERFORMED  Manuel Chapman Lorie Phenix 06/21/2021, 8:48 AM

## 2021-06-22 DIAGNOSIS — E1165 Type 2 diabetes mellitus with hyperglycemia: Secondary | ICD-10-CM

## 2021-06-22 LAB — CBC WITH DIFFERENTIAL/PLATELET
Abs Immature Granulocytes: 0.01 10*3/uL (ref 0.00–0.07)
Basophils Absolute: 0 10*3/uL (ref 0.0–0.1)
Basophils Relative: 0 %
Eosinophils Absolute: 0.1 10*3/uL (ref 0.0–0.5)
Eosinophils Relative: 2 %
HCT: 30.4 % — ABNORMAL LOW (ref 39.0–52.0)
Hemoglobin: 10.1 g/dL — ABNORMAL LOW (ref 13.0–17.0)
Immature Granulocytes: 0 %
Lymphocytes Relative: 27 %
Lymphs Abs: 1.6 10*3/uL (ref 0.7–4.0)
MCH: 29.7 pg (ref 26.0–34.0)
MCHC: 33.2 g/dL (ref 30.0–36.0)
MCV: 89.4 fL (ref 80.0–100.0)
Monocytes Absolute: 0.4 10*3/uL (ref 0.1–1.0)
Monocytes Relative: 6 %
Neutro Abs: 3.7 10*3/uL (ref 1.7–7.7)
Neutrophils Relative %: 65 %
Platelets: 182 10*3/uL (ref 150–400)
RBC: 3.4 MIL/uL — ABNORMAL LOW (ref 4.22–5.81)
RDW: 16.3 % — ABNORMAL HIGH (ref 11.5–15.5)
WBC: 5.8 10*3/uL (ref 4.0–10.5)
nRBC: 0 % (ref 0.0–0.2)

## 2021-06-22 LAB — GLUCOSE, CAPILLARY
Glucose-Capillary: 150 mg/dL — ABNORMAL HIGH (ref 70–99)
Glucose-Capillary: 161 mg/dL — ABNORMAL HIGH (ref 70–99)
Glucose-Capillary: 88 mg/dL (ref 70–99)
Glucose-Capillary: 137 mg/dL — ABNORMAL HIGH (ref 70–99)

## 2021-06-22 MED ORDER — INSULIN DETEMIR 100 UNIT/ML ~~LOC~~ SOLN: 10.0000 [IU] | Freq: Every day | SUBCUTANEOUS | 11 refills | Status: DC

## 2021-06-22 MED ORDER — CLONIDINE HCL 0.2 MG PO TABS: 0.2000 mg | ORAL_TABLET | Freq: Three times a day (TID) | ORAL | Status: DC

## 2021-06-22 MED ORDER — HYDRALAZINE HCL 25 MG PO TABS: 75.0000 mg | ORAL_TABLET | Freq: Four times a day (QID) | ORAL | Status: DC

## 2021-06-22 MED ORDER — METOPROLOL SUCCINATE ER 25 MG PO TB24: 25.0000 mg | ORAL_TABLET | Freq: Every day | ORAL | Status: DC

## 2021-06-22 MED ORDER — ATORVASTATIN CALCIUM 80 MG PO TABS: 80.0000 mg | ORAL_TABLET | Freq: Every day | ORAL | Status: AC

## 2021-06-22 NOTE — Progress Notes (Signed)
Occupational Therapy Session Note  Patient Details  Name: Manuel Chapman MRN: [id number] Date of Birth: 06/27/1945  Today's Date: 06/22/2021 OT Individual Time: 0830-0900 OT Individual Time Calculation (min): 30 min    Short Term Goals: Week 5:  OT Short Term Goal 1 (Week 5): LTG=STG 2/2 ELOS  Skilled Therapeutic Interventions/Progress Updates:    Pt received supine with no c/o pain, but reporting fatigue from early morning vitals and ADLs. Discussed possibility of reducing vitals checks but pt stating "they're just doing their job" and declined. Pt completed bed mobility with mod I to EOB. He completed 150 ft of functional mobility with (S)- mod I with the RW. He required encouragement for increasing pace/distance. Pt required an extended rest break and then completed an additional 50 ft of functional mobility. Pt emotional talking about d/c planning and his daughters death, emotional support provided. Pt returned to his room and was left sitting up in the recliner with all needs met, chair alarm set.   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

## 2021-06-22 NOTE — Progress Notes (Signed)
PROGRESS NOTE   Subjective/Complaints: No new issues. Left shoulder feels ok. Sleeping well  ROS: Patient denies fever, rash, sore throat, blurred vision, dizziness, nausea, vomiting, diarrhea, cough, shortness of breath or chest pain, joint or back/neck pain, headache, or mood change.   Objective:   No results found. Recent Labs    06/22/21 0716  WBC 5.8  HGB 10.1*  HCT 30.4*  PLT 182    No results for input(s): NA, K, CL, CO2, GLUCOSE, BUN, CREATININE, CALCIUM in the last 72 hours.      Intake/Output Summary (Last 24 hours) at 06/22/2021 1053 Last data filed at 06/22/2021 0830 Gross per 24 hour  Intake 960 ml  Output 1850 ml  Net -890 ml         Physical Exam: Vital Signs Blood pressure (!) 144/56, pulse 60, temperature 97.8 F (36.6 C), resp. rate 18, height 5' 7.01" (1.702 m), weight 101.4 kg, SpO2 100 %. Constitutional: No distress . Vital signs reviewed. HEENT: NCAT, EOMI, oral membranes moist Neck: supple Cardiovascular: RRR with murmur. No JVD    Respiratory/Chest: CTA Bilaterally without wheezes or rales. Normal effort    GI/Abdomen: BS +, non-tender, non-distended Ext: no clubbing, cyanosis, or edema Psych: pleasant and cooperative  Skin: No evidence of breakdown, no evidence of rash Neurologic: Alert and oriented Follows commands.  Motor: Grossly intact   Assessment/Plan: 1. Functional deficits which require 3+ hours per day of interdisciplinary therapy in a comprehensive inpatient rehab setting. Physiatrist is providing close team supervision and 24 hour management of active medical problems listed below. Physiatrist and rehab team continue to assess barriers to discharge/monitor patient progress toward functional and medical goals  Care Tool:  Bathing    Body parts bathed by patient: Left arm, Chest, Abdomen, Front perineal area, Right upper leg, Face, Right arm, Left lower leg, Right  lower leg, Left upper leg   Body parts bathed by helper: Right arm, Buttocks, Right lower leg, Left lower leg     Bathing assist Assist Level: Minimal Assistance - Patient > 75%     Upper Body Dressing/Undressing Upper body dressing   What is the patient wearing?: Pull over shirt    Upper body assist Assist Level: Set up assist    Lower Body Dressing/Undressing Lower body dressing      What is the patient wearing?: Pants     Lower body assist Assist for lower body dressing: Maximal Assistance - Patient 25 - 49% Assistive Device Comment: reacher   Toileting Toileting Toileting Activity did not occur Press photographer and hygiene only): Refused  Toileting assist Assist for toileting: Moderate Assistance - Patient 50 - 74%     Transfers Chair/bed transfer  Transfers assist  Chair/bed transfer activity did not occur: Safety/medical concerns  Chair/bed transfer assist level: Contact Guard/Touching assist     Locomotion Ambulation   Ambulation assist      Assist level: Minimal Assistance - Patient > 75% Assistive device: Walker-rolling Max distance: 12'   Walk 10 feet activity   Assist     Assist level: Minimal Assistance - Patient > 75% Assistive device: Walker-rolling   Walk 50 feet activity   Assist Walk  50 feet with 2 turns activity did not occur: Safety/medical concerns  Assist level: Minimal Assistance - Patient > 75% Assistive device: Walker-rolling    Walk 150 feet activity   Assist Walk 150 feet activity did not occur: Safety/medical concerns         Walk 10 feet on uneven surface  activity   Assist Walk 10 feet on uneven surfaces activity did not occur: Safety/medical concerns         Wheelchair     Assist Is the patient using a wheelchair?: Yes Type of Wheelchair: Manual    Wheelchair assist level: Maximal Assistance - Patient 25 - 49% Max wheelchair distance: 20    Wheelchair 50 feet with 2 turns  activity    Assist        Assist Level: Maximal Assistance - Patient 25 - 49% (per PT documentation)   Wheelchair 150 feet activity     Assist      Assist Level: Total Assistance - Patient < 25% (per PT documentation)   Blood pressure (!) 144/56, pulse 60, temperature 97.8 F (36.6 C), resp. rate 18, height 5' 7.01" (1.702 m), weight 101.4 kg, SpO2 100 %.  Medical Problem List and Plan: 1. 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   2.  Antithrombotics: -DVT/anticoagulation:  Pharmaceutical: Heparin  Cbc table in appearance 2/27 although platelets a little lower             -antiplatelet therapy: Aspirin 81 mg daily and Plavix 75 mg day x3 weeks  -now on Plavix alone    3. Pain Management: Tylenol as needed -continue aspercreme for left shoulder -can use muscle rub also   -reviewed stretches HE needs to work on while by himself in room    Subacromial left shoulder injection 1/31 with benefit 4. Mood and sleep/wake:      -now off ritalin             -antipsychotic agents: N/A 5. Neuropsych: This patient is capable of making decisions on his own behalf. 6. Skin/Wound Care: Routine skin checks 7. Fluids/Electrolytes/Nutrition:      Patient looks like he is at his baseline for renal function, urine output increasing      BMP Latest Ref Rng & Units 06/17/2021 06/11/2021 06/08/2021  Glucose 70 - 99 mg/dL 829(H) 371(I) 967(E)  BUN 8 - 23 mg/dL 93(Y) 10(F) 75(Z)  Creatinine 0.61 - 1.24 mg/dL 0.25 8.52 7.78  Sodium 135 - 145 mmol/L 137 137 139  Potassium 3.5 - 5.1 mmol/L 4.5 4.7 4.8  Chloride 98 - 111 mmol/L 107 107 109  CO2 22 - 32 mmol/L 25 23 24   Calcium 8.9 - 10.3 mg/dL 8.3(L) 8.5(L) 8.7(L)     8.  Uncontrolled diabetes mellitus with hyperglycemia.  Hemoglobin A1c 9.9.   NovoLog  3 units 3 times daily, increased to 4 3 times daily on 2/26 CBG (last 3)  Recent Labs    06/21/21 1627 06/21/21 2121 06/22/21 0635  GLUCAP 144* 124* 150*  2/27 improved control overall Labile on 2/26 9.  Hypertension.  Lisinopril 40 mg daily, Toprol-XL 50 mg daily, hydralazine 50 mg every 8 hours, Hygroton 25 mg daily.   Vitals:   06/22/21 0425 06/22/21 0943  BP: (!) 143/55 (!) 144/56  Pulse: (!) 56 60  Resp: 18   Temp: 97.8 F (36.6 C)   SpO2: 100%    Increase hydralazine to 75mg  TID   clonidine to 0.2 mg  tid, will DC IV fluids renal status at baseline  Controlled reasonably 2/27 10.  Hyperlipidemia.  continue Lipitor 11.  History of gout.  continue Allopurinol.  Monitor for any gout flareups   13.  Aortic stenosis- OP f/u with cardiology  14. Bradycardia: decreased lopressor to 25mg .     LOS: 44 days A FACE TO FACE EVALUATION WAS PERFORMED  3/27 06/22/2021, 10:53 AM

## 2021-06-22 NOTE — Progress Notes (Addendum)
Patient ID: Manuel Chapman, male   DOB: June 17, 1945, 76 y.o.   MRN: [id number]  SW spoke with pt dtr [name] and pt SIL Oswaldo Done to discuss considering SNF options. Family to view facility in [facility] and will follow-up with SW.   SW waiting on return phone call from Chryl Heck MCD CM [phone]) to discuss issues with pt Medicaid ID#.  *SW received updates from family that they would like to move forward with [facility]. SW left message for Marcia Brash with [facility] facilities [phone]) informing on above, and waiting on follow-up.   Cecile Sheerer, MSW, LCSWA Office: [phone] Cell: [phone] Fax: [phone]

## 2021-06-22 NOTE — Progress Notes (Signed)
Physical Therapy Session Note  Patient Details  Name: Manuel Chapman MRN: [id number] Date of Birth: [birth year]  Today's Date: 06/22/2021 PT Individual Time: [id number] PT Individual Time Calculation (min): 39 min   Short Term Goals: Week 5:  PT Short Term Goal 1 (Week 5): STGs = LTGs  Skilled Therapeutic Interventions/Progress Updates:     Pt received seated in [facility] and agrees to therapy. No complaint of pain. WC transport to gym for time management. Sit to stand with RW and setup assistance. Pt ambulates x200' with RW and cues for upright gaze to improve posture and balance, and increasing gait speed to decrease risk for falls. Pt makes notable change in effort following PT cues, demonstrating improvement from previous sessions. PT cuts pt's scrub pants during seated rest break so that fabric dos not hand down below feet. Pt ambulates x250' with same cueing and improving gait pattern. Following extended seated rest break, pt ambulates final bout of 100' with RW with cues to "walk as fast as possible". Pt demonstrates notable increase in effort relative to previous sessions with corresponding slight increase in gait speed. WC transport back to room. Left seated in recliner with alarm intact and all needs within reach.   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 Restrictions Weight Bearing Restrictions: No    Therapy/Group: Individual Therapy  Breck Coons, PT, DPT 06/22/2021, 3:52 PM

## 2021-06-23 LAB — GLUCOSE, CAPILLARY
Glucose-Capillary: 70 mg/dL (ref 70–99)
Glucose-Capillary: 108 mg/dL — ABNORMAL HIGH (ref 70–99)
Glucose-Capillary: 172 mg/dL — ABNORMAL HIGH (ref 70–99)
Glucose-Capillary: 174 mg/dL — ABNORMAL HIGH (ref 70–99)

## 2021-06-23 NOTE — Progress Notes (Signed)
Occupational Therapy Session Note  Patient Details  Name: Manuel Chapman MRN: [id number] Date of Birth: 1945-12-23  Today's Date: 06/23/2021 OT Individual Time: 2257-5051 OT Individual Time Calculation (min): 14 min    Short Term Goals: Week 1:  OT Short Term Goal 1 (Week 1): Patient will complete 1/3 parts of toileting task with Min A and LRAD. OT Short Term Goal 1 - Progress (Week 1): Progressing toward goal OT Short Term Goal 2 (Week 1): Patient will don UB clothing with set-up assist in sitting. OT Short Term Goal 2 - Progress (Week 1): Met OT Short Term Goal 3 (Week 1): Patient will don LB clothing with Min A and LRAD. OT Short Term Goal 3 - Progress (Week 1): Met OT Short Term Goal 4 (Week 1): Patient will complete toilet transfer with Min A and LRAD. OT Short Term Goal 4 - Progress (Week 1): Met  Skilled Therapeutic Interventions/Progress Updates:     Pt received in recliner with no pain.  ADL: Educated pt on DC from caseload tomorrow and pt took well. Pt educated on use of toilet tongs to work on posterior hygeine and pt receptive to trying tomorrow. Pt simulates toileting and would be unable to reach back with either arm to completes buttock hygiene. Pt reproting having A with this prior to CVA. Pt incontinent of urine in brief and unaware. Pt requires set up for peri care and MOD A to don brief in standing.   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

## 2021-06-23 NOTE — Progress Notes (Signed)
Physical Therapy Session Note  Patient Details  Name: Manuel Chapman MRN: [id number] Date of Birth: 1945/10/13  Today's Date: 06/23/2021 PT Individual Time: 0805-0845 PT Individual Time Calculation (min): 40 min   Short Term Goals: Week 5:  PT Short Term Goal 1 (Week 5): STGs = LTGs  Skilled Therapeutic Interventions/Progress Updates:     Pt received supine in bed and agrees to therapy. No complaint of pain. Supine to sit with bed features at mod(I). Pt performs sit to stand with RW and cues for anterior weight transition and sequencing. Ambulatory transfer to toilet and pt is partially continent of bladder in toilet. Pt ambulates back to bed with RW and doffs soiled pants and brief with minA. PT provides modA to don clean brief and pants. Pt ambulates x120' to gym with RW and cues to increase upright gaze to improve posture and balance and increase gait speed to decrease risk for falls. Pt does not noticeably alter gait pattern, saying "mornings are not good for me." Pt performs nustep for strength and endurance training. Pt completes x11:00 at workload of 4 with average steps per minute ~50. Pt ambulates x120' back to room with RW. Left seated with all needs within reach.  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

## 2021-06-23 NOTE — Progress Notes (Signed)
Patient ID: Manuel Chapman, male   DOB: Jan 05, 1946, 76 y.o.   MRN: [id number]  SW met with pt in room to inform patient on how he will be discharged from therapies due to gains made, and will continue to work on getting him a nursing home. He does not remember SW, but acknowledges his dtr [name] and [employer]  working on this as well. He is aware SW will follow-up once there is more information.  [place] spoke with pt dtr [name] to provide above updates. SW shared will follow-up with Medicaid worker again tomorrow if there is no response since we are waiting to hear the reason there is an issue with the Medicaid number. She is aware SW will follow-up once there is more information.   Loralee Pacas, MSW, [place] Office: [phone] Cell: [phone] Fax: [phone]

## 2021-06-23 NOTE — Patient Care Conference (Signed)
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) Active Problems:   Tendonitis of left rotator cuff   Labile blood glucose   Uncontrolled type 2 diabetes mellitus with hyperglycemia [facility])   Essential hypertension    Expected Discharge Date: Expected Discharge Date:  (pending LTC medicaid)  Team Members Present: Physician leading conference: Dr. Faith Rogue Social Worker Present: Cecile Sheerer, LCSWA Nurse Present: Kennyth Arnold, RN PT Present: Malachi Pro, PT OT Present: Roney Mans, OT SLP Present: Feliberto Gottron, SLP PPS Coordinator present : Fae Pippin, SLP     Current Status/Progress Goal Weekly Team Focus  Bowel/Bladder   Continent B/B with some episodes of incontinence at times. LBM 06/20/21  Pt will maintain regular B/B pattern  Toilet pt. q2hrs and PRN   Swallow/Nutrition/ Hydration             ADL's             Mobility   supervision  Supervision  DC   Communication             Safety/Cognition/ Behavioral Observations            Pain   No pain verbalized by pt.  Pt will be pain free  Assess pt for pain q shift and PRN   Skin   No new skin breakdown  Pt will maintain intact skin  Assess pt. for skin breakdown q shift and PRN     Discharge Planning:  Pt will d/c to SNF. Pt approved for LTC Medicaid. SNF location pending a facility that is willing to accept with LTC MCD since his current insurance plan denied SNF placement. Pt declined by insurance for placement even after  appeal at federal level.   Team Discussion: Medically stable for discharge. Incontinent. No pain reported. Waiting on Medicaid, issues with case number. Approved for LTC Medicaid.  Patient on target to meet rehab goals: yes, discharged from therapy.  *See Care Plan and progress notes for long and short-term goals.   Revisions to Treatment Plan:  Discharged from therapy.   Teaching Needs: Education complete, waiting on discharge location.   Current Barriers to Discharge: Placement to LTC facility  Possible Resolutions to Barriers: SW working with Medicaid to approve discharge location.     Medical Summary Current Status: bp better controlled. po intake of liquids improved. off ivf. left shoulder pain better  Barriers to Discharge: Medical stability   Possible Resolutions to Barriers/Weekly Focus: daily assessment of labs and pt data   Continued Need for Acute Rehabilitation Level of Care: The patient requires daily medical management by a physician with specialized training in physical medicine and rehabilitation for the following reasons: Direction of a multidisciplinary physical rehabilitation program to maximize functional independence : Yes Medical management of patient stability for increased activity during participation in an intensive rehabilitation regime.: Yes Analysis of laboratory values and/or radiology reports with any subsequent need for medication adjustment and/or medical intervention. : Yes   I attest that I was  present, lead the team conference, and concur with the assessment and plan of the team.   Tennis Must 06/23/2021, 3:43 PM

## 2021-06-23 NOTE — Progress Notes (Signed)
PROGRESS NOTE   Subjective/Complaints: Up walking in walker with PT. No complaints.   ROS: Patient denies fever, rash, sore throat, blurred vision, dizziness, nausea, vomiting, diarrhea, cough, shortness of breath or chest pain, joint or back/neck pain, headache, or mood change.   Objective:   No results found. Recent Labs    06/22/21 0716  WBC 5.8  HGB 10.1*  HCT 30.4*  PLT 182    No results for input(s): NA, K, CL, CO2, GLUCOSE, BUN, CREATININE, CALCIUM in the last 72 hours.      Intake/Output Summary (Last 24 hours) at 06/23/2021 0924 Last data filed at 06/23/2021 0900 Gross per 24 hour  Intake 1560 ml  Output 1800 ml  Net -240 ml         Physical Exam: Vital Signs Blood pressure (!) 134/54, pulse 60, temperature 97.6 F (36.4 C), resp. rate 20, height 5' 7.01" (1.702 m), weight 101.4 kg, SpO2 100 %. Constitutional: No distress . Vital signs reviewed. HEENT: NCAT, EOMI, oral membranes moist Neck: supple Cardiovascular: RRR without murmur. No JVD    Respiratory/Chest: CTA Bilaterally without wheezes or rales. Normal effort    GI/Abdomen: BS +, non-tender, non-distended Ext: no clubbing, cyanosis, or edema Psych: pleasant and cooperative  Skin: No evidence of breakdown, no evidence of rash Neurologic: Alert and oriented Follows commands.  Motor: Grossly intact  Musc: left knee hyperextension in stance without pain  Assessment/Plan: 1. Functional deficits which require 3+ hours per day of interdisciplinary therapy in a comprehensive inpatient rehab setting. Physiatrist is providing close team supervision and 24 hour management of active medical problems listed below. Physiatrist and rehab team continue to assess barriers to discharge/monitor patient progress toward functional and medical goals  Care Tool:  Bathing    Body parts bathed by patient: Left arm, Chest, Abdomen, Front perineal area, Right  upper leg, Face, Right arm, Left lower leg, Right lower leg, Left upper leg   Body parts bathed by helper: Right arm, Buttocks, Right lower leg, Left lower leg     Bathing assist Assist Level: Minimal Assistance - Patient > 75%     Upper Body Dressing/Undressing Upper body dressing   What is the patient wearing?: Pull over shirt    Upper body assist Assist Level: Set up assist    Lower Body Dressing/Undressing Lower body dressing      What is the patient wearing?: Pants     Lower body assist Assist for lower body dressing: Maximal Assistance - Patient 25 - 49% Assistive Device Comment: reacher   Toileting Toileting Toileting Activity did not occur Press photographer and hygiene only): Refused  Toileting assist Assist for toileting: Moderate Assistance - Patient 50 - 74%     Transfers Chair/bed transfer  Transfers assist  Chair/bed transfer activity did not occur: Safety/medical concerns  Chair/bed transfer assist level: Contact Guard/Touching assist     Locomotion Ambulation   Ambulation assist      Assist level: Minimal Assistance - Patient > 75% Assistive device: Walker-rolling Max distance: 12'   Walk 10 feet activity   Assist     Assist level: Minimal Assistance - Patient > 75% Assistive device: Walker-rolling  Walk 50 feet activity   Assist Walk 50 feet with 2 turns activity did not occur: Safety/medical concerns  Assist level: Minimal Assistance - Patient > 75% Assistive device: Walker-rolling    Walk 150 feet activity   Assist Walk 150 feet activity did not occur: Safety/medical concerns         Walk 10 feet on uneven surface  activity   Assist Walk 10 feet on uneven surfaces activity did not occur: Safety/medical concerns         Wheelchair     Assist Is the patient using a wheelchair?: Yes Type of Wheelchair: Manual    Wheelchair assist level: Maximal Assistance - Patient 25 - 49% Max wheelchair distance: 20     Wheelchair 50 feet with 2 turns activity    Assist        Assist Level: Maximal Assistance - Patient 25 - 49% (per PT documentation)   Wheelchair 150 feet activity     Assist      Assist Level: Total Assistance - Patient < 25% (per PT documentation)   Blood pressure (!) 134/54, pulse 60, temperature 97.6 F (36.4 C), resp. rate 20, height 5' 7.01" (1.702 m), weight 101.4 kg, SpO2 100 %.  Medical Problem List and Plan: 1. 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 Interdisciplinary team conference today to discuss goals, barriers to discharge, and dc planning.   SNF pending this week.   2.  Antithrombotics: -DVT/anticoagulation:  Pharmaceutical: Heparin  Cbc table in appearance 2/27 although platelets a little lower             -antiplatelet therapy: Aspirin 81 mg daily and Plavix 75 mg day x3 weeks  -now on Plavix alone    3. Pain Management: Tylenol as needed -continue aspercreme for left shoulder -can use muscle rub also   -reviewed stretches HE needs to work on while by himself in room    Subacromial left shoulder injection 1/31 with benefit   -minimal left knee pain despite recurvatum 4. Mood and sleep/wake:      -now off ritalin             -antipsychotic agents: N/A 5. Neuropsych: This patient is capable of making decisions on his own behalf. 6. Skin/Wound Care: Routine skin checks 7. Fluids/Electrolytes/Nutrition:      Patient looks like he is at his baseline for renal function, urine output increasing      BMP Latest Ref Rng & Units 06/17/2021 06/11/2021 06/08/2021  Glucose 70 - 99 mg/dL 863(O) 177(N) 165(B)  BUN 8 - 23 mg/dL 90(X) 83(F) 38(V)  Creatinine 0.61 - 1.24 mg/dL 2.91 9.16 6.06  Sodium 135 - 145 mmol/L 137 137 139  Potassium 3.5 - 5.1 mmol/L 4.5 4.7  4.8  Chloride 98 - 111 mmol/L 107 107 109  CO2 22 - 32 mmol/L 25 23 24   Calcium 8.9 - 10.3 mg/dL 8.3(L) 8.5(L) 8.7(L)     8.  Uncontrolled diabetes mellitus with hyperglycemia.  Hemoglobin A1c 9.9.   NovoLog 3 units 3 times daily, increased to 4 3 times daily on 2/26 CBG (last 3)  Recent Labs    06/22/21 1640 06/22/21 2116 06/23/21 0633  GLUCAP 88 137* 70  2/28 improved control overall  9.  Hypertension.  Lisinopril 40 mg daily, Toprol-XL 50 mg daily, hydralazine 50 mg every 8 hours, Hygroton 25 mg daily.   Vitals:   06/23/21 0356 06/23/21 0915  BP: (!) 132/48 (!) 134/54  Pulse: (!) 55 60  Resp: 20   Temp: 97.6 F (36.4 C)   SpO2: 100%    Increase hydralazine to 75mg  TID   clonidine to 0.2 mg  tid, will DC IV fluids renal status at baseline  Controlled 2/28 10.  Hyperlipidemia.  continue Lipitor 11.  History of gout.  continue Allopurinol.  Monitor for any gout flareups   13.  Aortic stenosis- OP f/u with cardiology  14. Bradycardia:  lopressor to 25mg . HR in 60's    LOS: 45 days A FACE TO FACE EVALUATION WAS PERFORMED  3/28 06/23/2021, 9:24 AM

## 2021-06-24 LAB — GLUCOSE, CAPILLARY
Glucose-Capillary: 86 mg/dL (ref 70–99)
Glucose-Capillary: 158 mg/dL — ABNORMAL HIGH (ref 70–99)
Glucose-Capillary: 155 mg/dL — ABNORMAL HIGH (ref 70–99)
Glucose-Capillary: 174 mg/dL — ABNORMAL HIGH (ref 70–99)

## 2021-06-24 NOTE — Progress Notes (Signed)
Physical Therapy Discharge Summary ? ?Patient Details  ?Name: Manuel Chapman ?MRN: [id number] ?Date of Birth: 02/28/1946 ? ?Today's Date: 06/24/2021 ?PT Individual Time: 7371-0626 ?PT Individual Time Calculation (min): 28 min  ? ? ?Patient has met 6 of 6 long term goals due to improved activity tolerance, improved balance, improved postural control, increased strength, improved attention, improved awareness, and improved coordination.  Patient to discharge at an ambulatory level Supervision.  Patient will be discharging to SNF so did not family education session. ? ?Reasons goals not met: NA ? ?Recommendation:  ?Patient has reached supervision level with PT, and continues to have deficits in balance and strength, but is near baseline with mobility and is not receptive to cueing from PT to improve balance. Pt will continue to benefit from mobility, but is safe for RN staff to mobilize until discharge to SNF. ? ?Equipment: ?No equipment provided ? ?Reasons for discharge: treatment goals met ? ?Patient/family agrees with progress made and goals achieved: Yes ? ?Skilled Therapeutic Interventions: ? ?Pt received seated in recliner and agrees to therapy. No complaint of pain. Stand step transfer to [name] with RW and cues for positioning. WC transport to gym for time management. Pt completes x12 6" steps with bilateral hand rails and cues for sequencing and increased hip extension. Following seated rest break, pt completes car transfer and ramp navigation with RW and cues for sequencing. Pt then performs 6 minute walk test and achieve score of 240'. PT cues for upright gaze to improve posture and balance, and increasing gait speed to decrease risk for falls. Pt says he is not interested in altering gait pattern, despite education. WC transport back to room. Stand step transfer to recliner. Left seated with alarm intact and all needs within reach. ? 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 Rarely or not at all ?Pain Interference with Therapy Activities: 1. Rarely or not at all ?Pain Interference with Day-to-Day Activities: 1. Rarely or not at all ?Vision/Perception  ?Vision - History ?Ability to See in Adequate Light: 0 Adequate ?Perception ?Perception: Impaired ?Praxis ?Praxis: Intact  ?Cognition ?Overall Cognitive Status: History of cognitive impairments - at baseline ?Arousal/Alertness: Awake/alert ?Orientation Level: Oriented to person;Oriented to place;Oriented to situation ?Memory: Impaired ?Awareness: Impaired ?Safety/Judgment: Appears intact ?Comments: Decreased knowledge of current deficits; decreased safety awareness ?Sensation ?Sensation ?Light Touch: Appears Intact ?Coordination ?Gross Motor Movements are Fluid and Coordinated: Yes ?Fine Motor Movements are Fluid and Coordinated: No ?Motor  ?Motor ?Motor: Hemiplegia ?Motor - Skilled Clinical Observations: L hemi-mild  ?Mobility ?Bed Mobility ?Bed Mobility: Supine to Sit;Sit to Supine ?Supine to Sit: Supervision/Verbal cueing ?Sit to Supine: Supervision/Verbal cueing ?Transfers ?Sit to Stand: Supervision/Verbal cueing ?Stand to Sit: Supervision/Verbal cueing ?Transfer (Assistive device): Rolling walker ?Locomotion  ?Gait ?Ambulation: Yes ?Gait Assistance: Supervision/Verbal cueing ?Gait Distance (Feet): 240 Feet ?Assistive device: Rolling walker ?Gait Assistance Details: Verbal cues for safe use of DME/AE;Verbal cues for precautions/safety ?Gait ?Gait: Yes ?Gait Pattern: Impaired ?Gait Pattern: Decreased stride length;Wide base of support ?Stairs / Additional Locomotion ?Stairs: Yes ?Stairs Assistance: Supervision/Verbal cueing ?Stair Management Technique: Two rails ?Number of Stairs: 12 ?Height of Stairs: 6 ?Ramp: Supervision/Verbal cueing ?Curb:  Supervision/Verbal cueing ?Wheelchair Mobility ?Wheelchair Mobility: No  ?Trunk/Postural Assessment  ?Cervical Assessment ?Cervical Assessment: Within Functional Limits ?Thoracic Assessment ?Thoracic Assessment: Within Functional Limits ?Lumbar Assessment ?Lumbar Assessment: Within Functional Limits ?Postural Control ?Postural Control: Deficits  on evaluation (improved)  ?Balance ?Balance ?Balance Assessed: Yes ?Static Sitting Balance ?Static Sitting - Balance Support: Feet supported ?Static Sitting - Level of Assistance: 6: Modified independent (Device/Increase time) ?Dynamic Sitting Balance ?Dynamic Sitting - Balance Support: No upper extremity supported;Feet supported ?Dynamic Sitting - Level of Assistance: 5: Stand by assistance ?Static Standing Balance ?Static Standing - Balance Support: Bilateral upper extremity supported ?Static Standing - Level of Assistance: 5: Stand by assistance ?Dynamic Standing Balance ?Dynamic Standing - Balance Support: Bilateral upper extremity supported ?Dynamic Standing - Level of Assistance: 5: Stand by assistance ?Extremity Assessment  ?RLE Assessment ?RLE Assessment: Exceptions to [facility] ?General Strength Comments: Grossly 4/5 ?LLE Assessment ?LLE Assessment: Exceptions to [facility] ?General Strength Comments: Grossly 4/5 ? ? ?Breck Coons, PT, DPT ?06/24/2021, 4:20 PM ?

## 2021-06-24 NOTE — Progress Notes (Signed)
Patient ID: Manuel Chapman, male   DOB: 1945-05-28, 76 y.o.   MRN: [id number] ? ?SW left message for SW with [place] (Aging and Adult services; [phone]) and Vallarie Mare MCD CM [phone]) to discuss pt Medicaid ID# issues. SW waiting on follow-up.  ? ?Loralee Pacas, MSW, LCSWA ?Office: [phone] ?Cell: [phone] ?Fax: [phone]  ?

## 2021-06-24 NOTE — Progress Notes (Signed)
Recreational Therapy Session Note ? ?Patient Details  ?Name: Manuel Chapman ?MRN: [id number] ?Date of Birth: Mar 10, 1946 ?Today's Date: 06/24/2021 ?Time:  1300-1420 ?Pain: no c/o ?Skilled Therapeutic Interventions/Progress Updates: Met with pt in the room.  Pt sitting quietly in recliner stating he had a lot to think about.  Pt still unhappy about prolonged hospitalization and no where to go from here.  Pt states he thinks he may be leaving soon, just not sure where he'll be going.  Pt shared that he just learned about the death of a close friend which was upsetting.  Pt then discussing recent loss of multiple friends and its affects on him. Pt uses humor in conversation when he becomes emotional/tearful.  Emotional support provided, Allowing pt space to share/explore his thoughts and feelings.  Pt appreciative. ? ?Therapy/Group: Individual Therapy ? ?Rajni Holsworth ?06/24/2021, 3:58 PM  ?

## 2021-06-24 NOTE — Progress Notes (Signed)
Occupational Therapy Discharge Summary ? ?Patient Details  ?Name: Manuel Chapman ?MRN: [id number] ?Date of Birth: [birth year] ? ?Today's Date: 06/24/2021 ?OT Individual Time: 1655-3748 ?OT Individual Time Calculation (min): 30 min  ? ? ?Pt received in bed with no pain. Pt able to recall this is last day of tx as pt has reached goals and is being discharged from caseload while awaiting SNF placement  ?ADL: ?Pt completes ADL at overall Supervision-CGA Level. Skilled interventions include: education and demo of use of toilet tongs, hwoever do not have pair to issue for pt. Pt states that he would  likely still ask for help even if they worked. Pt able to bathe and dress with S except to wash buttocks. Pt requires VC for anterior weight shift to stand with RW and VC for hand placement. Pt uses AE with no cuing to dress LB. Pt transfer to recliner with supervision and RW ? ?Pt left at end of session in bed with exit alarm on, call light in reach and all needs met ? ? ? ?Patient has met 11 of 11 long term goals due to improved activity tolerance, improved balance, postural control, ability to compensate for deficits, functional use of  LEFT upper and LEFT lower extremity, improved attention, improved awareness, and improved coordination ?Marland Kitchen  Patient to discharge at overall Supervision level with A only for buttock hygeine after toileting and bathing. Pt has had an extended stay d/t issues with discharge location and no family being willing ot take pt. Pt has been minimally receptive to improving mobility and ADL with use of adaptive or corrective strategies and has maximized his potential at this level of care.  ?Reasons goals not met: n/a ? ?Recommendation:  ?Patient will benefit from ongoing skilled OT services in skilled nursing facility setting to continue to advance functional skills in the area of BADL and Reduce care partner burden. ? ?Equipment: ?No equipment provided ? ?Reasons for discharge: treatment goals met  and discharge from hospital ? ?Patient/family agrees with progress made and goals achieved: Yes ? 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  ?Vital Signs ?Therapy Vitals ?Temp: (!) 97.5 ?F (36.4 ?C) ?Temp Source: Oral ?Pulse Rate: (!) 52 ?Resp: 15 ?BP: (!) 137/58 ?Patient Position (if appropriate): Lying ?Oxygen Therapy ?SpO2: 100 % ?O2 Device: Room Air ?Pain ?Pain Assessment ?Pain Score: 0-No pain ?ADL ?ADL ?Eating: Not assessed ?Grooming: Supervision/safety ?Where Assessed-Grooming: Edge of bed ?Upper Body Bathing: Supervision/safety ?Where Assessed-Upper Body Bathing: Edge of bed ?Lower Body Bathing: Contact guard ?Where Assessed-Lower Body Bathing: Edge of bed ?Upper Body Dressing: Supervision/safety ?Where Assessed-Upper Body Dressing: Edge of bed ?Lower Body Dressing: Supervision/safety ?Where Assessed-Lower Body Dressing: Edge of bed ?Toileting: Minimal assistance ?Where Assessed-Toileting: Bedside Commode ?Toilet Transfer: Close supervision ?Toilet Transfer Method: Ambulating ?Tub/Shower Transfer: Close supervison ?Tub/Shower Transfer Method: Transfer board ?Vision ?Baseline Vision/History: 1 Wears glasses ?Patient Visual Report: No change from baseline ?Vision Assessment?: No apparent visual deficits ?Perception  ?Perception: Impaired ?Praxis ?Praxis: Intact ?Cognition ?Overall Cognitive Status: History of cognitive impairments - at baseline ?Arousal/Alertness: Awake/alert ?Orientation Level: Oriented to place;Oriented to situation;Oriented to person;Disoriented to time ?Year: Other (Comment) ?Month: February ?Day of Week: Incorrect ?Attention: Sustained ?Sustained Attention: Appears intact ?Memory: Impaired ?Memory Impairment: Storage deficit;Decreased short term memory ?Immediate Memory Recall: Sock;Blue;Bed ?Memory Recall Sock: Not able to  recall ?Memory Recall Blue: With Cue ?Memory Recall Bed: Not able to recall ?Awareness: Impaired ?  Awareness Impairment: Intellectual impairment ?Problem Solving: Impaired ?Reasoning: Impaired ?Organizing: Impaired ?Comments: Decreased knowledge of current deficits; decreased safety awareness ?Sensation ?Sensation ?Light Touch: Appears Intact ?Coordination ?Gross Motor Movements are Fluid and Coordinated: Yes ?Fine Motor Movements are Fluid and Coordinated: No ?Motor  ?Motor ?Motor: Hemiplegia ?Motor - Skilled Clinical Observations: L hemi-mild ?Mobility  ?Transfers ?Sit to Stand: Supervision/Verbal cueing ?Stand to Sit: Supervision/Verbal cueing  ?Trunk/Postural Assessment  ?Cervical Assessment ?Cervical Assessment: Within Functional Limits ?Thoracic Assessment ?Thoracic Assessment: Within Functional Limits ?Lumbar Assessment ?Lumbar Assessment: Within Functional Limits ?Postural Control ?Postural Control: Deficits on evaluation (improved)  ?Balance ?Static Sitting Balance ?Static Sitting - Level of Assistance: 6: Modified independent (Device/Increase time) ?Dynamic Sitting Balance ?Dynamic Sitting - Level of Assistance: 5: Stand by assistance ?Static Standing Balance ?Static Standing - Level of Assistance: 5: Stand by assistance ?Dynamic Standing Balance ?Dynamic Standing - Level of Assistance: 5: Stand by assistance ?Extremity/Trunk Assessment ?RUE Assessment ?RUE Assessment: Within Functional Limits ?LUE Assessment ?LUE Assessment: Exceptions to [facility] ?Brunstrum level for arm: Stage V Relative Independence from Synergy ?Brunstrum level for hand: Stage V Independence from basic synergies ? ? ?Lowella Dell Briane Birden ?06/24/2021, 8:42 AM ?

## 2021-06-24 NOTE — Progress Notes (Signed)
?                                                       PROGRESS NOTE ? ? ?Subjective/Complaints: ?No problems overnight. Feels well ? ?ROS: Patient denies fever, rash, sore throat, blurred vision, dizziness, nausea, vomiting, diarrhea, cough, shortness of breath or chest pain, joint or back/neck pain, headache, or mood change.  ? ?Objective: ?  ?No results found. ?Recent Labs  ?  06/22/21 ?0716  ?WBC 5.8  ?HGB 10.1*  ?HCT 30.4*  ?PLT 182  ? ? ?No results for input(s): NA, K, CL, CO2, GLUCOSE, BUN, CREATININE, CALCIUM in the last 72 hours. ? ? ? ? ? ?Intake/Output Summary (Last 24 hours) at 06/24/2021 0845 ?Last data filed at 06/24/2021 0700 ?Gross per 24 hour  ?Intake 1680 ml  ?Output 300 ml  ?Net 1380 ml  ?  ? ?  ? ? ?Physical Exam: ?Vital Signs ?Blood pressure (!) 137/58, pulse (!) 52, temperature (!) 97.5 ?F (36.4 ?C), temperature source Oral, resp. rate 15, height 5' 7.01" (1.702 m), weight 101.4 kg, SpO2 100 %. ?Constitutional: No distress . Vital signs reviewed. ?HEENT: NCAT, EOMI, oral membranes moist ?Neck: supple ?Cardiovascular: RRR without murmur. No JVD    ?Respiratory/Chest: CTA Bilaterally without wheezes or rales. Normal effort    ?GI/Abdomen: BS +, non-tender, non-distended ?Ext: no clubbing, cyanosis, or edema ?Psych: pleasant and cooperative  ?Skin: No evidence of breakdown, no evidence of rash ?Neurologic: Alert and oriented ?Follows commands.  ?Motor: Grossly intact  ?Musc: left knee hyperextension in stance without pain, moving left shoulder better ? ?Assessment/Plan: ?1. Functional deficits which require 3+ hours per day of interdisciplinary therapy in a comprehensive inpatient rehab setting. ?Physiatrist is providing close team supervision and 24 hour management of active medical problems listed below. ?Physiatrist and rehab team continue to assess barriers to discharge/monitor patient progress toward functional and medical goals ? ?Care Tool: ? ?Bathing ?   ?Body parts bathed by patient: Left  arm, Chest, Abdomen, Front perineal area, Right upper leg, Face, Right arm, Left lower leg, Right lower leg, Left upper leg  ? Body parts bathed by helper: Buttocks ?  ?  ?Bathing assist Assist Level: Contact Guard/Touching assist ?  ?  ?Upper Body Dressing/Undressing ?Upper body dressing   ?What is the patient wearing?: Pull over shirt ?   ?Upper body assist Assist Level: Set up assist ?   ?Lower Body Dressing/Undressing ?Lower body dressing ? ? ?   ?What is the patient wearing?: Pants ? ?  ? ?Lower body assist Assist for lower body dressing: Set up assist ?Assistive Device Comment: reacher  ? ?Toileting ?Toileting Toileting Activity did not occur Press photographer and hygiene only): Refused  ?Toileting assist Assist for toileting: Minimal Assistance - Patient > 75% ?  ?  ?Transfers ?Chair/bed transfer ? ?Transfers assist ? Chair/bed transfer activity did not occur: Safety/medical concerns ? ?Chair/bed transfer assist level: Supervision/Verbal cueing ?  ?  ?Locomotion ?Ambulation ? ? ?Ambulation assist ? ?   ? ?Assist level: Minimal Assistance - Patient > 75% ?Assistive device: Walker-rolling ?Max distance: 64'  ? ?Walk 10 feet activity ? ? ?Assist ?   ? ?Assist level: Minimal Assistance - Patient > 75% ?Assistive device: Walker-rolling  ? ?Walk 50 feet activity ? ? ?Assist Walk 50 feet with 2  turns activity did not occur: Safety/medical concerns ? ?Assist level: Minimal Assistance - Patient > 75% ?Assistive device: Walker-rolling  ? ? ?Walk 150 feet activity ? ? ?Assist Walk 150 feet activity did not occur: Safety/medical concerns ? ?  ?  ?  ? ?Walk 10 feet on uneven surface  ?activity ? ? ?Assist Walk 10 feet on uneven surfaces activity did not occur: Safety/medical concerns ? ? ?  ?   ? ?Wheelchair ? ? ? ? ?Assist Is the patient using a wheelchair?: Yes ?Type of Wheelchair: Manual ?  ? ?Wheelchair assist level: Maximal Assistance - Patient 25 - 49% ?Max wheelchair distance: 20  ? ? ?Wheelchair 50 feet with 2  turns activity ? ? ? ?Assist ? ?  ?  ? ? ?Assist Level: Maximal Assistance - Patient 25 - 49% (per PT documentation)  ? ?Wheelchair 150 feet activity  ? ? ? ?Assist ?   ? ? ?Assist Level: Total Assistance - Patient < 25% (per PT documentation)  ? ?Blood pressure (!) 137/58, pulse (!) 52, temperature (!) 97.5 ?F (36.4 ?C), temperature source Oral, resp. rate 15, height 5' 7.01" (1.702 m), weight 101.4 kg, SpO2 100 %. ? ?Medical Problem List and Plan: ?1. 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           grad day from therapy. Pt at goal level of supervision with mobility.  ? NH pending this week.   ?2.  Antithrombotics: ?-DVT/anticoagulation:  Pharmaceutical: Heparin ? Cbc table in appearance 2/27 although platelets a little lower ?            -antiplatelet therapy: Aspirin 81 mg daily and Plavix 75 mg day x3 weeks  ?-now on Plavix alone    ?3. Pain Management: Tylenol as needed ?-continue aspercreme for left shoulder ?-can use muscle rub also ?  -reviewed stretches HE needs to work on while by himself in room ?   Subacromial left shoulder injection 1/31 with benefit ?  -minimal left knee pain  ?4. Mood and sleep/wake:   ?   -now off ritalin ?            -antipsychotic agents: N/A ?5. Neuropsych: This patient is capable of making decisions on his own behalf. ?6. Skin/Wound Care: Routine skin checks ?7. Fluids/Electrolytes/Nutrition:    ? ? Patient looks like he is at his baseline for renal function, urine output increasing ?  ?   ?BMP Latest Ref Rng & Units 06/17/2021 06/11/2021 06/08/2021  ?Glucose 70 - 99 mg/dL 937(J) 696(V) 893(Y)  ?BUN 8 - 23 mg/dL 10(F) 75(Z) 02(H)  ?Creatinine 0.61 - 1.24 mg/dL 8.52 7.78 2.42  ?Sodium 135 - 145 mmol/L 137 137 139  ?Potassium 3.5 - 5.1 mmol/L 4.5 4.7 4.8  ?Chloride 98 - 111 mmol/L 107 107 109  ?CO2 22 - 32 mmol/L 25 23 24   ?Calcium 8.9 -  10.3 mg/dL 8.3(L) 8.5(L) 8.7(L)  ?  ? ?8.  Uncontrolled diabetes mellitus with hyperglycemia.  Hemoglobin A1c 9.9.   ?NovoLog 3 units 3 times daily, increased to 4 3 times daily on 2/26 ?CBG (last 3)  ?Recent Labs  ?  06/23/21 ?1646 06/23/21 ?2117 06/24/21 ?0630  ?GLUCAP 172* 174* 86  ?3/1 recent elevation. No changes today ? ?9.  Hypertension.  Lisinopril 40 mg daily, Toprol-XL 50 mg daily, hydralazine 50 mg every 8 hours, Hygroton  25 mg daily.   ?Vitals:  ? 06/23/21 2013 06/24/21 0457  ?BP: (!) 156/59 (!) 137/58  ?Pulse: 61 (!) 52  ?Resp: 20 15  ?Temp: 97.7 ?F (36.5 ?C) (!) 97.5 ?F (36.4 ?C)  ?SpO2: 100% 100%  ? Increase hydralazine to 75mg  TID ?  clonidine to 0.2 mg  tid, will DC IV fluids renal status at baseline ? Controlled 3/1 ?10.  Hyperlipidemia.  continue Lipitor ?11.  History of gout.  continue Allopurinol.  Monitor for any gout flareups  ? 13.  Aortic stenosis- OP f/u with cardiology  ?14. Bradycardia:  lopressor to 25mg . HR in 60's ?  ? ?LOS: ?46 days ?A FACE TO FACE EVALUATION WAS PERFORMED ? ?Ranelle Oyster ?06/24/2021, 8:45 AM  ? ? ? ?

## 2021-06-25 LAB — GLUCOSE, CAPILLARY
Glucose-Capillary: 111 mg/dL — ABNORMAL HIGH (ref 70–99)
Glucose-Capillary: 160 mg/dL — ABNORMAL HIGH (ref 70–99)
Glucose-Capillary: 117 mg/dL — ABNORMAL HIGH (ref 70–99)
Glucose-Capillary: 83 mg/dL (ref 70–99)

## 2021-06-25 MED ORDER — INSULIN DETEMIR 100 UNIT/ML ~~LOC~~ SOLN
12.0000 [IU] | Freq: Every day | SUBCUTANEOUS | Status: DC
  Administered 2021-06-26 – 2021-07-02 (×7): 12 [IU] via SUBCUTANEOUS
  Filled 2021-06-25 (×8): qty 0.12

## 2021-06-25 NOTE — Progress Notes (Signed)
Patient ID: Treshaun Carrico, male   DOB: 11/13/1945, 76 y.o.   MRN: [id number] ? ?SW left message for Chryl Heck MCD CM [phone]) to discuss Medicaid ID # and waiting on follow-up.  ? ?SW spoke with Marcia Brash with [facility] [phone]) to discuss if a bed remains at [facility]. She will check his Medicaid ID# again to see if active and will follow-up.  ?*SW received message from [place] indicating that MID # still comes up as failed.  ? ?SW emailed Melissa Gentle to discuss further about MID# [id number] O. Continues to provide this as accurate MID#. ? ?*SW spoke with Loreta Ave to discuss further. Reports she will look into this further, and placed SW on hold. SW hung up and will wait for follow-up.  ? ?SW spoke with Franki Monte (Aging and Adult services; [phone])  to inform on above. SW will follow-up once there is more information.  ? ?Cecile Sheerer, MSW, LCSWA ?Office: [phone] ?Cell: [phone] ?Fax: [phone]  ?

## 2021-06-25 NOTE — Progress Notes (Addendum)
?                                                       PROGRESS NOTE ? ? ?Subjective/Complaints: ?No new issues. Sleeping well. Pain controlled ? ?ROS: Patient denies fever, rash, sore throat, blurred vision, dizziness, nausea, vomiting, diarrhea, cough, shortness of breath or chest pain, joint or back/neck pain, headache, or mood change.  ? ?Objective: ?  ?No results found. ?No results for input(s): WBC, HGB, HCT, PLT in the last 72 hours. ? ? ?No results for input(s): NA, K, CL, CO2, GLUCOSE, BUN, CREATININE, CALCIUM in the last 72 hours. ? ? ? ? ? ?Intake/Output Summary (Last 24 hours) at 06/25/2021 1129 ?Last data filed at 06/25/2021 [phone] ?Gross per 24 hour  ?Intake 1320 ml  ?Output 1125 ml  ?Net 195 ml  ?  ? ?  ? ? ?Physical Exam: ?Vital Signs ?Blood pressure (!) 129/56, pulse (!) 58, temperature 97.8 ?F (36.6 ?C), temperature source Oral, resp. rate 16, height 5' 7.01" (1.702 m), weight 101.4 kg, SpO2 100 %. ?Constitutional: No distress . Vital signs reviewed. ?HEENT: NCAT, EOMI, oral membranes moist ?Neck: supple ?Cardiovascular: RRR without murmur. No JVD    ?Respiratory/Chest: CTA Bilaterally without wheezes or rales. Normal effort    ?GI/Abdomen: BS +, non-tender, non-distended ?Ext: no clubbing, cyanosis, or edema ?Psych: pleasant and cooperative  ?Skin: No evidence of breakdown, no evidence of rash ?Neurologic: Alert and oriented ?Follows commands.  ?Motor: Grossly intact  ?Musc: left shoulder rom improving ? ?Assessment/Plan: ?1. Functional deficits which require 3+ hours per day of interdisciplinary therapy in a comprehensive inpatient rehab setting. ?Physiatrist is providing close team supervision and 24 hour management of active medical problems listed below. ?Physiatrist and rehab team continue to assess barriers to discharge/monitor patient progress toward functional and medical goals ? ?Care Tool: ? ?Bathing ?   ?Body parts bathed by patient: Left arm, Chest, Abdomen, Front perineal area, Right  upper leg, Face, Right arm, Left lower leg, Right lower leg, Left upper leg  ? Body parts bathed by helper: Buttocks ?  ?  ?Bathing assist Assist Level: Contact Guard/Touching assist ?  ?  ?Upper Body Dressing/Undressing ?Upper body dressing   ?What is the patient wearing?: Pull over shirt ?   ?Upper body assist Assist Level: Set up assist ?   ?Lower Body Dressing/Undressing ?Lower body dressing ? ? ?   ?What is the patient wearing?: Pants ? ?  ? ?Lower body assist Assist for lower body dressing: Set up assist ?Assistive Device Comment: reacher  ? ?Toileting ?Toileting Toileting Activity did not occur Press photographer and hygiene only): Refused  ?Toileting assist Assist for toileting: Minimal Assistance - Patient > 75% ?  ?  ?Transfers ?Chair/bed transfer ? ?Transfers assist ? Chair/bed transfer activity did not occur: Safety/medical concerns ? ?Chair/bed transfer assist level: Supervision/Verbal cueing ?  ?  ?Locomotion ?Ambulation ? ? ?Ambulation assist ? ?   ? ?Assist level: Supervision/Verbal cueing ?Assistive device: Walker-rolling ?Max distance: 86'  ? ?Walk 10 feet activity ? ? ?Assist ?   ? ?Assist level: Supervision/Verbal cueing ?Assistive device: Walker-rolling  ? ?Walk 50 feet activity ? ? ?Assist Walk 50 feet with 2 turns activity did not occur: Safety/medical concerns ? ?Assist level: Supervision/Verbal cueing ?Assistive device: Walker-rolling  ? ? ?Walk 150 feet  activity ? ? ?Assist Walk 150 feet activity did not occur: Safety/medical concerns ? ?Assist level: Supervision/Verbal cueing ?Assistive device: Walker-rolling ?  ? ?Walk 10 feet on uneven surface  ?activity ? ? ?Assist Walk 10 feet on uneven surfaces activity did not occur: Safety/medical concerns ? ? ?Assist level: Supervision/Verbal cueing ?Assistive device: Walker-rolling  ? ?Wheelchair ? ? ? ? ?Assist Is the patient using a wheelchair?: No ?Type of Wheelchair: Manual ?  ? ?Wheelchair assist level: Maximal Assistance - Patient 25 -  49% ?Max wheelchair distance: 20  ? ? ?Wheelchair 50 feet with 2 turns activity ? ? ? ?Assist ? ?  ?  ? ? ?Assist Level: Maximal Assistance - Patient 25 - 49% (per PT documentation)  ? ?Wheelchair 150 feet activity  ? ? ? ?Assist ?   ? ? ?Assist Level: Total Assistance - Patient < 25% (per PT documentation)  ? ?Blood pressure (!) 129/56, pulse (!) 58, temperature 97.8 ?F (36.6 ?C), temperature source Oral, resp. rate 16, height 5' 7.01" (1.702 m), weight 101.4 kg, SpO2 100 %. ? ?Medical Problem List and Plan: ?1. 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           pt d/ced from therapy ? NH pending this week.   ?2.  Antithrombotics: ?-DVT/anticoagulation:  Pharmaceutical: Heparin ?   ?            -antiplatelet therapy: -now on Plavix alone    ?3. Pain Management: Tylenol as needed ?-continue aspercreme for left shoulder ?-can use muscle rub also ?  -reviewed stretches HE needs to work on while by himself in room ?   Subacromial left shoulder injection 1/31 with benefit ?  -minimal left knee pain  ?4. Mood and sleep/wake:   ?   -now off ritalin ?            -antipsychotic agents: N/A ?5. Neuropsych: This patient is capable of making decisions on his own behalf. ?6. Skin/Wound Care: Routine skin checks ?7. Fluids/Electrolytes/Nutrition:    ? ? Patient looks like he is at his baseline for renal function, urine output increasing ?  ?   ?BMP Latest Ref Rng & Units 06/17/2021 06/11/2021 06/08/2021  ?Glucose 70 - 99 mg/dL 474(Q) 595(G) 387(F)  ?BUN 8 - 23 mg/dL 64(P) 32(R) 51(O)  ?Creatinine 0.61 - 1.24 mg/dL 8.41 6.60 6.30  ?Sodium 135 - 145 mmol/L 137 137 139  ?Potassium 3.5 - 5.1 mmol/L 4.5 4.7 4.8  ?Chloride 98 - 111 mmol/L 107 107 109  ?CO2 22 - 32 mmol/L 25 23 24   ?Calcium 8.9 - 10.3 mg/dL 8.3(L) 8.5(L) 8.7(L)  ?  ? ?8.  Uncontrolled diabetes mellitus with hyperglycemia.   Hemoglobin A1c 9.9.   ?NovoLog 3 units 3 times daily, increased to 4 3 times daily on 2/26 ?CBG (last 3)  ?Recent Labs  ?  06/24/21 ?1632 06/24/21 ?2151 06/25/21 ?0625  ?GLUCAP 155* 174* 111*  ?3/2 fair control. Will bump am levemir to 12u, continue 10u qpm ? ?9.  Hypertension.  Lisinopril 40 mg daily, Toprol-XL 50 mg daily, hydralazine 50 mg every 8 hours, Hygroton 25 mg daily.   ?Vitals:  ? 06/24/21 1938 06/25/21 0430  ?BP: 138/60 (!) 129/56  ?Pulse: 67 (!) 58  ?Resp: 18 16  ?Temp: 97.9 ?F (36.6 ?C) 97.8 ?F (36.6 ?C)  ?SpO2: 100% 100%  ? Increased  hydralazine to 75mg  TID ?  clonidine to 0.2 mg  tid ? Controlled 3/2 ?10.  Hyperlipidemia.  continue Lipitor ?11.  History of gout.  continue Allopurinol.  Monitor for any gout flareups  ? 13.  Aortic stenosis- OP f/u with cardiology  ?14. Bradycardia:  lopressor to 25mg . HR in 60's ?  ? ?LOS: ?47 days ?A FACE TO FACE EVALUATION WAS PERFORMED ? ? ?06/25/2021, 11:29 AM  ? ? ? ?

## 2021-06-26 LAB — GLUCOSE, CAPILLARY
Glucose-Capillary: 121 mg/dL — ABNORMAL HIGH (ref 70–99)
Glucose-Capillary: 100 mg/dL — ABNORMAL HIGH (ref 70–99)
Glucose-Capillary: 209 mg/dL — ABNORMAL HIGH (ref 70–99)
Glucose-Capillary: 137 mg/dL — ABNORMAL HIGH (ref 70–99)

## 2021-06-26 NOTE — Progress Notes (Signed)
?                                                       PROGRESS NOTE ? ? ?Subjective/Complaints: ?Pt up. No problems this morning. ? ?ROS: Patient denies fever, rash, sore throat, blurred vision, dizziness, nausea, vomiting, diarrhea, cough, shortness of breath or chest pain, joint or back/neck pain, headache, or mood change.  ? ?Objective: ?  ?No results found. ?No results for input(s): WBC, HGB, HCT, PLT in the last 72 hours. ? ? ?No results for input(s): NA, K, CL, CO2, GLUCOSE, BUN, CREATININE, CALCIUM in the last 72 hours. ? ? ? ? ? ?Intake/Output Summary (Last 24 hours) at 06/26/2021 1120 ?Last data filed at 06/26/2021 7014 ?Gross per 24 hour  ?Intake 1080 ml  ?Output 1325 ml  ?Net -245 ml  ?  ? ?  ? ? ?Physical Exam: ?Vital Signs ?Blood pressure (!) 163/64, pulse (!) 57, temperature (!) 97.5 ?F (36.4 ?C), resp. rate 18, height 5' 7.01" (1.702 m), weight 101.4 kg, SpO2 100 %. ?Constitutional: No distress . Vital signs reviewed. obese ?HEENT: NCAT, EOMI, oral membranes moist ?Neck: supple ?Cardiovascular: RRR without murmur. No JVD    ?Respiratory/Chest: CTA Bilaterally without wheezes or rales. Normal effort    ?GI/Abdomen: BS +, non-tender, non-distended ?Ext: no clubbing, cyanosis, or edema ?Psych: pleasant and cooperative  ?Skin: No evidence of breakdown, no evidence of rash ?Neurologic: Alert and oriented ?Follows commands.  ?Motor: Grossly intact  ?Musc: left shoulder rom stable ? ?Assessment/Plan: ?1. Functional deficits which require 3+ hours per day of interdisciplinary therapy in a comprehensive inpatient rehab setting. ?Physiatrist is providing close team supervision and 24 hour management of active medical problems listed below. ?Physiatrist and rehab team continue to assess barriers to discharge/monitor patient progress toward functional and medical goals ? ?Care Tool: ? ?Bathing ?   ?Body parts bathed by patient: Left arm, Chest, Abdomen, Front perineal area, Right upper leg, Face, Right arm, Left  lower leg, Right lower leg, Left upper leg  ? Body parts bathed by helper: Buttocks ?  ?  ?Bathing assist Assist Level: Contact Guard/Touching assist ?  ?  ?Upper Body Dressing/Undressing ?Upper body dressing   ?What is the patient wearing?: Pull over shirt ?   ?Upper body assist Assist Level: Set up assist ?   ?Lower Body Dressing/Undressing ?Lower body dressing ? ? ?   ?What is the patient wearing?: Pants ? ?  ? ?Lower body assist Assist for lower body dressing: Set up assist ?Assistive Device Comment: reacher  ? ?Toileting ?Toileting Toileting Activity did not occur Press photographer and hygiene only): Refused  ?Toileting assist Assist for toileting: Minimal Assistance - Patient > 75% ?  ?  ?Transfers ?Chair/bed transfer ? ?Transfers assist ? Chair/bed transfer activity did not occur: Safety/medical concerns ? ?Chair/bed transfer assist level: Supervision/Verbal cueing ?  ?  ?Locomotion ?Ambulation ? ? ?Ambulation assist ? ?   ? ?Assist level: Supervision/Verbal cueing ?Assistive device: Walker-rolling ?Max distance: 36'  ? ?Walk 10 feet activity ? ? ?Assist ?   ? ?Assist level: Supervision/Verbal cueing ?Assistive device: Walker-rolling  ? ?Walk 50 feet activity ? ? ?Assist Walk 50 feet with 2 turns activity did not occur: Safety/medical concerns ? ?Assist level: Supervision/Verbal cueing ?Assistive device: Walker-rolling  ? ? ?Walk 150 feet activity ? ? ?  Assist Walk 150 feet activity did not occur: Safety/medical concerns ? ?Assist level: Supervision/Verbal cueing ?Assistive device: Walker-rolling ?  ? ?Walk 10 feet on uneven surface  ?activity ? ? ?Assist Walk 10 feet on uneven surfaces activity did not occur: Safety/medical concerns ? ? ?Assist level: Supervision/Verbal cueing ?Assistive device: Walker-rolling  ? ?Wheelchair ? ? ? ? ?Assist Is the patient using a wheelchair?: No ?Type of Wheelchair: Manual ?  ? ?Wheelchair assist level: Maximal Assistance - Patient 25 - 49% ?Max wheelchair distance: 20   ? ? ?Wheelchair 50 feet with 2 turns activity ? ? ? ?Assist ? ?  ?  ? ? ?Assist Level: Maximal Assistance - Patient 25 - 49% (per PT documentation)  ? ?Wheelchair 150 feet activity  ? ? ? ?Assist ?   ? ? ?Assist Level: Total Assistance - Patient < 25% (per PT documentation)  ? ?Blood pressure (!) 163/64, pulse (!) 57, temperature (!) 97.5 ?F (36.4 ?C), resp. rate 18, height 5' 7.01" (1.702 m), weight 101.4 kg, SpO2 100 %. ? ?Medical Problem List and Plan: ?1. 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           pt d/ced from therapy ? NH pending   ?2.  Antithrombotics: ?-DVT/anticoagulation:  Pharmaceutical: Heparin ?   ?            -antiplatelet therapy: -now on Plavix alone    ?3. Pain Management: Tylenol as needed ?-continue aspercreme for left shoulder ?-can use muscle rub also ?  -reviewed stretches HE needs to work on while by himself in room ?   Subacromial left shoulder injection 1/31 with benefit ?  -minimal left knee pain  ?4. Mood and sleep/wake:   ?   -now off ritalin ?            -antipsychotic agents: N/A ?5. Neuropsych: This patient is capable of making decisions on his own behalf. ?6. Skin/Wound Care: Routine skin checks ?7. Fluids/Electrolytes/Nutrition:    ? ? Patient looks like he is at his baseline for renal function, urine output increasing ?  ?   ?BMP Latest Ref Rng & Units 06/17/2021 06/11/2021 06/08/2021  ?Glucose 70 - 99 mg/dL 962(I) 297(L) 892(J)  ?BUN 8 - 23 mg/dL 19(E) 17(E) 08(X)  ?Creatinine 0.61 - 1.24 mg/dL 4.48 1.85 6.31  ?Sodium 135 - 145 mmol/L 137 137 139  ?Potassium 3.5 - 5.1 mmol/L 4.5 4.7 4.8  ?Chloride 98 - 111 mmol/L 107 107 109  ?CO2 22 - 32 mmol/L 25 23 24   ?Calcium 8.9 - 10.3 mg/dL 8.3(L) 8.5(L) 8.7(L)  ?  ? ?8.  Uncontrolled diabetes mellitus with hyperglycemia.  Hemoglobin A1c 9.9.   ?NovoLog 3 units 3 times daily, increased to 4 3  times daily on 2/26 ?CBG (last 3)  ?Recent Labs  ?  06/25/21 ?1631 06/25/21 ?2113 06/26/21 ?0619  ?GLUCAP 117* 83 121*  ?3/3--monitor. Have adjusted am levemir to 12u, continue 10u qpm ? ?9.  Hypertension.  Lisinopril 40 mg daily, Toprol-XL 50 mg daily, hydralazine 50 mg every 8 hours, Hygroton 25 mg daily.   ?Vitals:  ? 06/25/21 1937 06/26/21 0449  ?BP: (!) 129/53 (!) 163/64  ?Pulse: (!) 59 (!) 57  ?Resp: 16 18  ?Temp: 97.8 ?F (36.6 ?C) (!) 97.5 ?F (36.4 ?C)  ?SpO2: 100% 100%  ? Increased hydralazine to 75mg  TID ?  clonidine to 0.2 mg  tid ? Generally Controlled 3/3 ?10.  Hyperlipidemia.  continue Lipitor ?11.  History of gout.  continue Allopurinol.  Monitor for any gout flareups  ? 13.  Aortic stenosis- OP f/u with cardiology  ?14. Bradycardia:  lopressor to 25mg . HR in 60's ?  ? ?LOS: ?48 days ?A FACE TO FACE EVALUATION WAS PERFORMED ? ? ?06/26/2021, 11:20 AM  ? ? ? ?

## 2021-06-26 NOTE — Progress Notes (Signed)
Patient ID: Manuel Chapman, male   DOB: 1945-10-15, 76 y.o.   MRN: [id number] ? ?SW spoke with Franki Monte (Aging and Adult services; [phone])  to discuss message she left yesterday in which she indicates pt MID is active in [employer]. SW informed will continue to send out referrals and will see if able to get a LTC bed. Reports she will continue to explore options as well.  ? ?SW left message for Marcia Brash with [facility] [phone]) to discuss if a bed remains at [facility] and explained that current MID# is accurate in [facility]. SW waiting on follow-up.       ? ?SW left message got Sarina Ill and Reyne Dumas to discuss assistance with SNF options. SW waiting on follow-up to see if they are able to assist.  ? ?*SW spoke with [name] to inform on above, and indicate efforts will be made for him to stay locally, however, pt may have to go out of local area.  ? ?Cecile Sheerer, MSW, LCSWA ?Office: [phone] ?Cell: [phone] ?Fax: [phone]  ?

## 2021-06-27 LAB — GLUCOSE, CAPILLARY
Glucose-Capillary: 87 mg/dL (ref 70–99)
Glucose-Capillary: 114 mg/dL — ABNORMAL HIGH (ref 70–99)
Glucose-Capillary: 137 mg/dL — ABNORMAL HIGH (ref 70–99)
Glucose-Capillary: 143 mg/dL — ABNORMAL HIGH (ref 70–99)

## 2021-06-28 LAB — GLUCOSE, CAPILLARY
Glucose-Capillary: 95 mg/dL (ref 70–99)
Glucose-Capillary: 137 mg/dL — ABNORMAL HIGH (ref 70–99)
Glucose-Capillary: 159 mg/dL — ABNORMAL HIGH (ref 70–99)
Glucose-Capillary: 115 mg/dL — ABNORMAL HIGH (ref 70–99)

## 2021-06-29 LAB — GLUCOSE, CAPILLARY
Glucose-Capillary: 81 mg/dL (ref 70–99)
Glucose-Capillary: 139 mg/dL — ABNORMAL HIGH (ref 70–99)
Glucose-Capillary: 114 mg/dL — ABNORMAL HIGH (ref 70–99)
Glucose-Capillary: 150 mg/dL — ABNORMAL HIGH (ref 70–99)

## 2021-06-29 NOTE — Progress Notes (Signed)
?                                                       PROGRESS NOTE ? ? ?Subjective/Complaints: ?Pt finished 100% tray.  ?Asking when leaving rehab.  ? ?ROS:  ?Pt denies SOB, abd pain, CP, N/V/C/D, and vision changes ? ? ?Objective: ?  ?No results found. ?No results for input(s): WBC, HGB, HCT, PLT in the last 72 hours. ? ? ?No results for input(s): NA, K, CL, CO2, GLUCOSE, BUN, CREATININE, CALCIUM in the last 72 hours. ? ? ? ? ? ?Intake/Output Summary (Last 24 hours) at 06/29/2021 1312 ?Last data filed at 06/29/2021 1304 ?Gross per 24 hour  ?Intake 1067 ml  ?Output 1175 ml  ?Net -108 ml  ?  ? ?  ? ? ?Physical Exam: ?Vital Signs ?Blood pressure (!) 135/50, pulse 75, temperature 97.7 ?F (36.5 ?C), temperature source Oral, resp. rate 14, height 5' 7.01" (1.702 m), weight 101.4 kg, SpO2 100 %. ? ? ? ?General: awake, alert, appropriate, sitting up in bed- finished tray 100%; watching TV, NAD ?HENT: conjugate gaze; oropharynx moist ?CV: regular rate; no JVD ?Pulmonary: CTA B/L; no W/R/R- good air movement ?GI: soft, NT, ND, (+)BS ?Psychiatric: appropriate; flat ?Neurological: alert ?Ext: no clubbing, cyanosis, or edema ?Psych: pleasant and cooperative  ?Skin: No evidence of breakdown, no evidence of rash ?Neurologic: Alert and oriented ?Follows commands.  ?Motor: Grossly intact  ?Musc: left shoulder rom stable ? ?Assessment/Plan: ?1. Functional deficits which require 3+ hours per day of interdisciplinary therapy in a comprehensive inpatient rehab setting. ?Physiatrist is providing close team supervision and 24 hour management of active medical problems listed below. ?Physiatrist and rehab team continue to assess barriers to discharge/monitor patient progress toward functional and medical goals ? ?Care Tool: ? ?Bathing ?   ?Body parts bathed by patient: Left arm, Chest, Abdomen, Front perineal area, Right upper leg, Face, Right arm, Left lower leg, Right lower leg, Left upper leg  ? Body parts bathed by helper: Buttocks ?   ?  ?Bathing assist Assist Level: Contact Guard/Touching assist ?  ?  ?Upper Body Dressing/Undressing ?Upper body dressing   ?What is the patient wearing?: Pull over shirt ?   ?Upper body assist Assist Level: Set up assist ?   ?Lower Body Dressing/Undressing ?Lower body dressing ? ? ?   ?What is the patient wearing?: Pants ? ?  ? ?Lower body assist Assist for lower body dressing: Set up assist ?Assistive Device Comment: reacher  ? ?Toileting ?Toileting Toileting Activity did not occur Landscape architect and hygiene only): Refused  ?Toileting assist Assist for toileting: Minimal Assistance - Patient > 75% ?  ?  ?Transfers ?Chair/bed transfer ? ?Transfers assist ? Chair/bed transfer activity did not occur: Safety/medical concerns ? ?Chair/bed transfer assist level: Supervision/Verbal cueing ?  ?  ?Locomotion ?Ambulation ? ? ?Ambulation assist ? ?   ? ?Assist level: Supervision/Verbal cueing ?Assistive device: Walker-rolling ?Max distance: 83'  ? ?Walk 10 feet activity ? ? ?Assist ?   ? ?Assist level: Supervision/Verbal cueing ?Assistive device: Walker-rolling  ? ?Walk 50 feet activity ? ? ?Assist Walk 50 feet with 2 turns activity did not occur: Safety/medical concerns ? ?Assist level: Supervision/Verbal cueing ?Assistive device: Walker-rolling  ? ? ?Walk 150 feet activity ? ? ?Assist Walk 150 feet activity did not  occur: Safety/medical concerns ? ?Assist level: Supervision/Verbal cueing ?Assistive device: Walker-rolling ?  ? ?Walk 10 feet on uneven surface  ?activity ? ? ?Assist Walk 10 feet on uneven surfaces activity did not occur: Safety/medical concerns ? ? ?Assist level: Supervision/Verbal cueing ?Assistive device: Walker-rolling  ? ?Wheelchair ? ? ? ? ?Assist Is the patient using a wheelchair?: No ?Type of Wheelchair: Manual ?  ? ?Wheelchair assist level: Maximal Assistance - Patient 25 - 49% ?Max wheelchair distance: 20  ? ? ?Wheelchair 50 feet with 2 turns activity ? ? ? ?Assist ? ?  ?  ? ? ?Assist Level:  Maximal Assistance - Patient 25 - 49% (per PT documentation)  ? ?Wheelchair 150 feet activity  ? ? ? ?Assist ?   ? ? ?Assist Level: Total Assistance - Patient < 25% (per PT documentation)  ? ?Blood pressure (!) 135/50, pulse 75, temperature 97.7 ?F (36.5 ?C), temperature source Oral, resp. rate 14, height 5' 7.01" (1.702 m), weight 101.4 kg, SpO2 100 %. ? ?Medical Problem List and Plan: ?1. 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           pt d/ced from therapy ? NH pending  ?Waiting to hear when leaving-   ?2.  Antithrombotics: ?-DVT/anticoagulation:  Pharmaceutical: Heparin ?   ?            -antiplatelet therapy: -now on Plavix alone    ?3. Pain Management: Tylenol as needed ?-continue aspercreme for left shoulder ?-can use muscle rub also ?  -reviewed stretches HE needs to work on while by himself in room ?   Subacromial left shoulder injection 1/31 with benefit ?  -minimal left knee pain ?3/6 denies pain- con't regimen  ?4. Mood and sleep/wake:   ?   -now off ritalin ?            -antipsychotic agents: N/A ?5. Neuropsych: This patient is capable of making decisions on his own behalf. ?6. Skin/Wound Care: Routine skin checks ?7. Fluids/Electrolytes/Nutrition:    ? ? Patient looks like he is at his baseline for renal function, urine output increasing ?  ?   ?BMP Latest Ref Rng & Units 06/17/2021 06/11/2021 06/08/2021  ?Glucose 70 - 99 mg/dL 145(H) 165(H) 138(H)  ?BUN 8 - 23 mg/dL 32(H) 31(H) 37(H)  ?Creatinine 0.61 - 1.24 mg/dL 0.89 0.90 1.02  ?Sodium 135 - 145 mmol/L 137 137 139  ?Potassium 3.5 - 5.1 mmol/L 4.5 4.7 4.8  ?Chloride 98 - 111 mmol/L 107 107 109  ?CO2 22 - 32 mmol/L 25 23 24   ?Calcium 8.9 - 10.3 mg/dL 8.3(L) 8.5(L) 8.7(L)  ?  ? ?8.  Uncontrolled diabetes mellitus with hyperglycemia.  Hemoglobin A1c 9.9.   ?NovoLog 3 units 3 times daily, increased to 4 3 times  daily on 2/26 ?CBG (last 3)  ?Recent Labs  ?  06/28/21 ?2111 06/29/21 ?0609 06/29/21 ?1144  ?GLUCAP 115* 81 139*  ?3/3--monitor. Have adjusted am levemir to 12u, continue 10u qpm ?3/6- CBGs controlled- con't regimen ?9.  Hypertension.  Lisinopril 40 mg daily, Toprol-XL 50 mg daily, hydralazine 50 mg every 8 hours, Hygroton 25 mg daily.   ?Vitals:  ? 06/29/21 0534 06/29/21 1305  ?BP: (!) 162/72 (!) 135/50  ?Pulse: (!) 59 75  ?Resp: 14 14  ?Temp: 98.7 ?F (37.1 ?C) 97.7 ?F (36.5 ?C)  ?SpO2: 100% 100%  ?  Increased hydralazine to 75mg  TID ?  clonidine to 0.2 mg  tid ? 3/6- BP controlled most of time- con't regimen ?10.  Hyperlipidemia.  continue Lipitor ?11.  History of gout.  continue Allopurinol.  Monitor for any gout flareups  ? 13.  Aortic stenosis- OP f/u with cardiology  ?14. Bradycardia:  lopressor to 25mg . HR in 60's ?  ? ?LOS: ?51 days ?A FACE TO FACE EVALUATION WAS PERFORMED ? ?Cort Dragoo ?06/29/2021, 1:12 PM  ? ? ? ?

## 2021-06-30 LAB — GLUCOSE, CAPILLARY
Glucose-Capillary: 169 mg/dL — ABNORMAL HIGH (ref 70–99)
Glucose-Capillary: 137 mg/dL — ABNORMAL HIGH (ref 70–99)
Glucose-Capillary: 98 mg/dL (ref 70–99)
Glucose-Capillary: 198 mg/dL — ABNORMAL HIGH (ref 70–99)

## 2021-06-30 MED ORDER — HYDRALAZINE HCL 50 MG PO TABS
100.0000 mg | ORAL_TABLET | Freq: Four times a day (QID) | ORAL | Status: DC
  Administered 2021-06-30 – 2021-07-02 (×9): 100 mg via ORAL
  Filled 2021-06-30 (×10): qty 2

## 2021-06-30 NOTE — Progress Notes (Addendum)
Patient ID: Manuel Chapman, male   DOB: 1945/05/14, 76 y.o.   MRN: [id number] ? ?SW received phone call from Michelle/Admissions with [facility] informing able to accept pt. States family would need to make payment of [phone] (patient liability) before being admitted. SW called pt SIL Vincent and left message. SW called pt dtr [name] but no answer and unable to leave VM as voicemail is not set up.  ? ?*SW spoke with Oswaldo Done and he is in agreement with making payment. SW left message for Michelle/Admissions with [name] on family in agreement, left contact information for pt SIL Oswaldo Done, and  will arrange ambulance transport.  ? ?SW scheduled ambulance transportation with [facility] for pick up on Thursday (3/9) at 12:30pm. SW informed medical team, and left message for Michelle/Admissions with [facility].  ? ?SW received phone call from Michelle/[facility] stating she spoke to pt SIL Oswaldo Done and they will be in tomorrow morning at 930am.  ? ?Cecile Sheerer, MSW, LCSWA ?Office: [phone] ?Cell: [phone] ?Fax: [phone]  ?

## 2021-06-30 NOTE — Progress Notes (Signed)
?                                                       PROGRESS NOTE ? ? ?Subjective/Complaints: ?No new problems. Minor aches and pain ? ?ROS: Patient denies fever, rash, sore throat, blurred vision, dizziness, nausea, vomiting, diarrhea, cough, shortness of breath or chest pain, joint or back/neck pain, headache, or mood change.  ? ? ?Objective: ?  ?No results found. ?No results for input(s): WBC, HGB, HCT, PLT in the last 72 hours. ? ? ?No results for input(s): NA, K, CL, CO2, GLUCOSE, BUN, CREATININE, CALCIUM in the last 72 hours. ? ? ? ? ? ?Intake/Output Summary (Last 24 hours) at 06/30/2021 1149 ?Last data filed at 06/30/2021 0940 ?Gross per 24 hour  ?Intake 537 ml  ?Output 825 ml  ?Net -288 ml  ?  ? ?  ? ? ?Physical Exam: ?Vital Signs ?Blood pressure (!) 169/65, pulse (!) 58, temperature 98.5 ?F (36.9 ?C), temperature source Oral, resp. rate 16, height 5' 7.01" (1.702 m), weight 101.4 kg, SpO2 97 %. ?Constitutional: No distress . Vital signs reviewed. obese ?HEENT: NCAT, EOMI, oral membranes moist ?Neck: supple ?Cardiovascular: RRR without murmur. No JVD    ?Respiratory/Chest: CTA Bilaterally without wheezes or rales. Normal effort    ?GI/Abdomen: BS +, non-tender, non-distended ?Ext: no clubbing, cyanosis, or edema ?Psych: pleasant and cooperative  ?Skin: No evidence of breakdown, no evidence of rash ?Neurologic: Alert and oriented ?Follows commands.  ?Motor: Grossly intact  ?Musc: left shoulder rom stable ? ?Assessment/Plan: ?1. Functional deficits which require 3+ hours per day of interdisciplinary therapy in a comprehensive inpatient rehab setting. ?Physiatrist is providing close team supervision and 24 hour management of active medical problems listed below. ?Physiatrist and rehab team continue to assess barriers to discharge/monitor patient progress toward functional and medical goals ? ?Care Tool: ? ?Bathing ?   ?Body parts bathed by patient: Left arm, Chest, Abdomen, Front perineal area, Right upper  leg, Face, Right arm, Left lower leg, Right lower leg, Left upper leg  ? Body parts bathed by helper: Buttocks ?  ?  ?Bathing assist Assist Level: Contact Guard/Touching assist ?  ?  ?Upper Body Dressing/Undressing ?Upper body dressing   ?What is the patient wearing?: Pull over shirt ?   ?Upper body assist Assist Level: Set up assist ?   ?Lower Body Dressing/Undressing ?Lower body dressing ? ? ?   ?What is the patient wearing?: Pants ? ?  ? ?Lower body assist Assist for lower body dressing: Set up assist ?Assistive Device Comment: reacher  ? ?Toileting ?Toileting Toileting Activity did not occur Press photographer and hygiene only): Refused  ?Toileting assist Assist for toileting: Minimal Assistance - Patient > 75% ?  ?  ?Transfers ?Chair/bed transfer ? ?Transfers assist ? Chair/bed transfer activity did not occur: Safety/medical concerns ? ?Chair/bed transfer assist level: Supervision/Verbal cueing ?  ?  ?Locomotion ?Ambulation ? ? ?Ambulation assist ? ?   ? ?Assist level: Supervision/Verbal cueing ?Assistive device: Walker-rolling ?Max distance: 34'  ? ?Walk 10 feet activity ? ? ?Assist ?   ? ?Assist level: Supervision/Verbal cueing ?Assistive device: Walker-rolling  ? ?Walk 50 feet activity ? ? ?Assist Walk 50 feet with 2 turns activity did not occur: Safety/medical concerns ? ?Assist level: Supervision/Verbal cueing ?Assistive device: Walker-rolling  ? ? ?Walk  150 feet activity ? ? ?Assist Walk 150 feet activity did not occur: Safety/medical concerns ? ?Assist level: Supervision/Verbal cueing ?Assistive device: Walker-rolling ?  ? ?Walk 10 feet on uneven surface  ?activity ? ? ?Assist Walk 10 feet on uneven surfaces activity did not occur: Safety/medical concerns ? ? ?Assist level: Supervision/Verbal cueing ?Assistive device: Walker-rolling  ? ?Wheelchair ? ? ? ? ?Assist Is the patient using a wheelchair?: No ?Type of Wheelchair: Manual ?  ? ?Wheelchair assist level: Maximal Assistance - Patient 25 - 49% ?Max  wheelchair distance: 20  ? ? ?Wheelchair 50 feet with 2 turns activity ? ? ? ?Assist ? ?  ?  ? ? ?Assist Level: Maximal Assistance - Patient 25 - 49% (per PT documentation)  ? ?Wheelchair 150 feet activity  ? ? ? ?Assist ?   ? ? ?Assist Level: Total Assistance - Patient < 25% (per PT documentation)  ? ?Blood pressure (!) 169/65, pulse (!) 58, temperature 98.5 ?F (36.9 ?C), temperature source Oral, resp. rate 16, height 5' 7.01" (1.702 m), weight 101.4 kg, SpO2 97 %. ? ?Medical Problem List and Plan: ?1. 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           pt d/ced from therapy ? NH pending   ?2.  Antithrombotics: ?-DVT/anticoagulation:  Pharmaceutical: Heparin ?   ?            -antiplatelet therapy: -now on Plavix alone    ?3. Pain Management: Tylenol as needed ?-continue aspercreme for left shoulder ?-can use muscle rub also ?  -reviewed stretches HE needs to work on while by himself in room ?   Subacromial left shoulder injection 1/31 with benefit ?  -minimal left knee pain at this time ?4. Mood and sleep/wake:   ?   -now off ritalin ?            -antipsychotic agents: N/A ?5. Neuropsych: This patient is capable of making decisions on his own behalf. ?6. Skin/Wound Care: Routine skin checks ?7. Fluids/Electrolytes/Nutrition:    ? ? Patient looks like he is at his baseline for renal function, urine output increasing ?  ?   ?BMP Latest Ref Rng & Units 06/17/2021 06/11/2021 06/08/2021  ?Glucose 70 - 99 mg/dL 824(M) 353(I) 144(R)  ?BUN 8 - 23 mg/dL 15(Q) 00(Q) 67(Y)  ?Creatinine 0.61 - 1.24 mg/dL 1.95 0.93 2.67  ?Sodium 135 - 145 mmol/L 137 137 139  ?Potassium 3.5 - 5.1 mmol/L 4.5 4.7 4.8  ?Chloride 98 - 111 mmol/L 107 107 109  ?CO2 22 - 32 mmol/L 25 23 24   ?Calcium 8.9 - 10.3 mg/dL 8.3(L) 8.5(L) 8.7(L)  ?  ? ?8.  Uncontrolled diabetes mellitus with hyperglycemia.  Hemoglobin A1c  9.9.   ?NovoLog 3 units 3 times daily, increased to 4 3 times daily on 2/26 ?CBG (last 3)  ?Recent Labs  ?  06/29/21 ?2108 06/30/21 ?0628 06/30/21 ?1143  ?GLUCAP 150* 169* 137*  ?3/7 fair control.  Have adjusted am levemir to 12u, continue 10u qpm ? ?9.  Hypertension.  Lisinopril 40 mg daily, Toprol-XL 50 mg daily, hydralazine 50 mg every 8 hours, Hygroton 25 mg daily.   ?Vitals:  ? 06/29/21 2031 06/30/21 0533  ?BP: (!) 164/60 (!) 169/65  ?Pulse: (!) 57 (!) 58  ?Resp: 20 16  ?Temp: 97.8 ?F (36.6 ?C) 98.5 ?F (36.9 ?C)  ?SpO2:  100% 97%  ?  hydralazine to 75mg  TID ?  clonidine to 0.2 mg  tid ? 3/7 increase hydralazine to 100mg  tid ?10.  Hyperlipidemia.  continue Lipitor ?11.  History of gout.  continue Allopurinol.  Monitor for any gout flareups  ? 13.  Aortic stenosis- OP f/u with cardiology  ?14. Bradycardia:  lopressor to 25mg . HR in 60's ?  ? ?LOS: ?52 days ?A FACE TO FACE EVALUATION WAS PERFORMED ? ?5/7 ?06/30/2021, 11:49 AM  ? ? ? ?

## 2021-06-30 NOTE — Progress Notes (Signed)
Inpatient Rehabilitation Discharge Medication Review by a Pharmacist ? ?A complete drug regimen review was completed for this patient to identify any potential clinically significant medication issues. ? ?High Risk Drug Classes Is patient taking? Indication by Medication  ?Antipsychotic No   ?Anticoagulant No   ?Antibiotic No   ?Opioid No   ?Antiplatelet Yes Plavix for CVA ppx  ?Hypoglycemics/insulin Yes Novolog, Levemir for DM  ?Vasoactive Medication Yes Hygroton, clonidine, hydralazine, lisinopril, Toprol for BP  ?Chemotherapy No   ?Other Yes Allopurinol for gout ?Lipitor for HLD  ? ? ? ?Type of Medication Issue Identified Description of Issue Recommendation(s)  ?Drug Interaction(s) (clinically significant) ?    ?Duplicate Therapy ?    ?Allergy ?    ?No Medication Administration End Date ?    ?Incorrect Dose ?    ?Additional Drug Therapy Needed ?    ?Significant med changes from prior encounter (inform family/care partners about these prior to discharge).    ?Other ?    ? ? ?Clinically significant medication issues were identified that warrant physician communication and completion of prescribed/recommended actions by midnight of the next day:  No ? ?Pharmacist comments: None ? ?Time spent performing this drug regimen review (minutes):  20 minutes ? ? ?Tad Moore ?06/30/2021 3:19 PM ?

## 2021-06-30 NOTE — Patient Care Conference (Signed)
Inpatient RehabilitationTeam Conference and Plan of Care Update ?Date: 06/30/2021   Time: 10:39 AM  ? ? ?Patient Name: Manuel Chapman      ?Medical Record Number: [id number]  ?Date of Birth: 1946-02-13 ?Sex: Male         ?Room/Bed: 5C04C/5C04C-01 ?Payor Info: Payor: [employer] MEDICARE / Plan: [employer] MEDICARE HMO / Product Type: *No Product type* /   ? ?Admit Date/Time:  05/09/2021  3:10 PM ? 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 ?Hospital Problems: Principal Problem: ?  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  Tendonitis of left rotator cuff ?  Labile blood glucose ?  Uncontrolled type 2 diabetes mellitus with hyperglycemia (HCC) ?  Essential hypertension ? ? ? ?Expected Discharge Date: Expected Discharge Date:  (SNF) ? ?Team Members Present: ?Physician leading conference: Dr. Faith Rogue ?Social Worker Present: Cecile Sheerer, LCSWA ?Nurse Present: Kennyth Arnold, RN ?PT Present: Malachi Pro, PT ?OT Present: Roney Mans, OT ?PPS Coordinator present : Fae Pippin, SLP ? ?   Current Status/Progress Goal Weekly Team Focus  ?Bowel/Bladder ? ? Cont. of B/B with episodes of incont. LBM 06/28/21.  Remain cont,. of B.B  Assess toileting needs qhshift and PRN   ?Swallow/Nutrition/ Hydration ? ?           ?ADL's ? ?           ?Mobility ? ?           ?Communication ? ?           ?Safety/Cognition/ Behavioral Observations ?           ?Pain ? ? Denies and pain or discomfort  Remain free from pain  Assess pain qshift and PRN   ?Skin ? ? No new skin impairments  Remain free from skin impairments  Assess skin qshift and PRN   ? ? ?Discharge Planning:  ?Pt will d/c to SNF. Pt approved for LTC Medicaid. SNF location pending a facility that is willing to accept with LTC MCD since his current insurance plan denied SNF placement. Pt declined by insurance for placement even after appeal at federal level.   ?Team Discussion: ?Medically stable. Remains incontinent. Still working on placement.  Discharged from therapy. ? ?Patient on target to meet rehab goals: ?Discharge from therapy. ? ?*See Care Plan and progress notes for long and short-term goals.  ? ?Revisions to Treatment Plan:  ?None at this time. ?  ?Teaching Needs: ?Education completed. ?  ?Current Barriers to Discharge: ?Placement to LTC facility ? ?Possible Resolutions to Barriers: ?SW working to place patient. ?  ? ? Medical Summary ?  ?  ?  ?  ? ? ?  ? ? ?I attest that I was present, lead the team conference, and concur with the assessment and plan of the team. ? ? ?Kennyth Arnold G ?06/30/2021, 2:27 PM  ? ? ? ? ? ? ?

## 2021-07-01 LAB — SARS CORONAVIRUS 2 (TAT 6-24 HRS): SARS Coronavirus 2: NEGATIVE

## 2021-07-01 LAB — GLUCOSE, CAPILLARY
Glucose-Capillary: 113 mg/dL — ABNORMAL HIGH (ref 70–99)
Glucose-Capillary: 137 mg/dL — ABNORMAL HIGH (ref 70–99)
Glucose-Capillary: 112 mg/dL — ABNORMAL HIGH (ref 70–99)
Glucose-Capillary: 162 mg/dL — ABNORMAL HIGH (ref 70–99)

## 2021-07-01 MED ORDER — INSULIN DETEMIR 100 UNIT/ML ~~LOC~~ SOLN: 12.0000 [IU] | Freq: Every day | SUBCUTANEOUS | 11 refills | Status: DC

## 2021-07-01 MED ORDER — HYDRALAZINE HCL 100 MG PO TABS: 100.0000 mg | ORAL_TABLET | Freq: Four times a day (QID) | ORAL | Status: DC

## 2021-07-01 NOTE — Progress Notes (Signed)
Recreational Therapy Session Note ? ?Patient Details  ?Name: Manuel Chapman ?MRN: [id number] ?Date of Birth: [birth year] ?Today's Date: 07/01/2021 ? ?Pain: no c/o ?Skilled Therapeutic Interventions/Progress Updates: Pt in bed resting, uninterested in getting OOB.  Pt stated that he would be leaving tomorrow to a facility but could not remember the name or location.  Pt continued to express frustration with discharge planning and lack of family support.  Discussed importance of staying active at SNF, encouraged participation in the Activities program and encouraged self advocacy.  Pt stated understanding but will need encouragement for continued motivation.   ?Daria Mcmeekin ?07/01/2021, 4:02 PM  ?

## 2021-07-02 LAB — GLUCOSE, CAPILLARY
Glucose-Capillary: 87 mg/dL (ref 70–99)
Glucose-Capillary: 118 mg/dL — ABNORMAL HIGH (ref 70–99)
Glucose-Capillary: 139 mg/dL — ABNORMAL HIGH (ref 70–99)

## 2021-07-02 NOTE — Progress Notes (Signed)
INPATIENT REHABILITATION DISCHARGE NOTE ? ? ?Discharge instructions by: Deatra Ina, PA ? ?Verbalized understanding: Instructions sent via PTAR ? ?Skin care/Wound care healing? None present ? ?Pain: none ? ?IV's: none ? ?Tubes/Drains: none ? ?O2: none ? ?Safety instructions: reviewed ? ?Patient belongings: sent with pt ? ?Discharged to: [facility] ? ?Discharged via: PTAR ? ?Notes: Done. ? ?Marylu Lund, RN ? ? ? ? ? ? ? ?  ?

## 2021-07-02 NOTE — Progress Notes (Signed)
Patient ID: Manuel Chapman, male   DOB: Jun 02, 1945, 76 y.o.   MRN: [id number] ? ?SW confirmed with Michelle/Admissions with [facility] pt is cleared for admission as payment made. SW informed medical team. D/c packet left at front desk for assigned RN.  ? ?Cecile Sheerer, MSW, LCSWA ?Office: [phone] ?Cell: [phone] ?Fax: [phone]  ?

## 2021-07-02 NOTE — Progress Notes (Signed)
Recreational Therapy Discharge Summary ?Patient Details  ?Name: Elchanan Bob ?MRN: [id number] ?Date of Birth: 1945-07-11 ?Today's Date: 07/02/2021 ? ?Long term goals set: 1 ? ?Long term goals met: 1 ? ?Comments on progress toward goals: Pt is discharging to SNF today for 24 hour supervision/assistance.  TR sessions focused on activity tolerance, dynamic balance, UE use/strengthening/coordination, coping skills and providing emotional support.  Pt requires set up assistance for simple tasks seated and requires supervision-contact guard assist for standing tasks.  Pt does require verbal cueing for encouragement & motivation at times.   ? ?Reasons goals not met: n/a ? ?Equipment acquired: n/a ? ?Reasons for discharge: discharge from hospital ? ?Follow-up:  Activities programming at SNF ? ?Patient/family agrees with progress made and goals achieved: Yes ? ?Deannah Rossi ?07/02/2021, 3:22 PM ? ? ?

## 2021-07-02 NOTE — Progress Notes (Signed)
?                                                       PROGRESS NOTE ? ? ?Subjective/Complaints: ?Had a good night. Up beat about going to SNF today, changing locale ? ?ROS: Patient denies fever, rash, sore throat, blurred vision, dizziness, nausea, vomiting, diarrhea, cough, shortness of breath or chest pain, joint or back/neck pain, headache, or mood change.  ? ? ?Objective: ?  ?No results found. ?No results for input(s): WBC, HGB, HCT, PLT in the last 72 hours. ? ? ?No results for input(s): NA, K, CL, CO2, GLUCOSE, BUN, CREATININE, CALCIUM in the last 72 hours. ? ? ? ? ? ?Intake/Output Summary (Last 24 hours) at 07/02/2021 0926 ?Last data filed at 07/02/2021 0700 ?Gross per 24 hour  ?Intake 900 ml  ?Output 800 ml  ?Net 100 ml  ?  ? ?  ? ? ?Physical Exam: ?Vital Signs ?Blood pressure (!) 150/70, pulse (!) 58, temperature 97.6 ?F (36.4 ?C), resp. rate 17, height 5' 7.01" (1.702 m), weight 101.4 kg, SpO2 100 %. ?Constitutional: No distress . Vital signs reviewed. obese ?HEENT: NCAT, EOMI, oral membranes moist ?Neck: supple ?Cardiovascular: RRR without murmur. No JVD    ?Respiratory/Chest: CTA Bilaterally without wheezes or rales. Normal effort    ?GI/Abdomen: BS +, non-tender, non-distended ?Ext: no clubbing, cyanosis, or edema ?Psych: pleasant and cooperative  ?   ?Skin: No evidence of breakdown, no evidence of rash ?Neurologic: Alert and oriented ?Follows commands.  ?Motor: Grossly intact  ?Musc: left shoulder rom stable ? ?Assessment/Plan: ?1. Functional deficits which require 3+ hours per day of interdisciplinary therapy in a comprehensive inpatient rehab setting. ?Physiatrist is providing close team supervision and 24 hour management of active medical problems listed below. ?Physiatrist and rehab team continue to assess barriers to discharge/monitor patient progress toward functional and medical goals ? ?Care Tool: ? ?Bathing ?   ?Body parts bathed by patient: Left arm, Chest, Abdomen, Front perineal area, Right  upper leg, Face, Right arm, Left lower leg, Right lower leg, Left upper leg  ? Body parts bathed by helper: Buttocks ?  ?  ?Bathing assist Assist Level: Contact Guard/Touching assist ?  ?  ?Upper Body Dressing/Undressing ?Upper body dressing   ?What is the patient wearing?: Pull over shirt ?   ?Upper body assist Assist Level: Set up assist ?   ?Lower Body Dressing/Undressing ?Lower body dressing ? ? ?   ?What is the patient wearing?: Pants ? ?  ? ?Lower body assist Assist for lower body dressing: Set up assist ?Assistive Device Comment: reacher  ? ?Toileting ?Toileting Toileting Activity did not occur Press photographer and hygiene only): Refused  ?Toileting assist Assist for toileting: Minimal Assistance - Patient > 75% ?  ?  ?Transfers ?Chair/bed transfer ? ?Transfers assist ? Chair/bed transfer activity did not occur: Safety/medical concerns ? ?Chair/bed transfer assist level: Supervision/Verbal cueing ?  ?  ?Locomotion ?Ambulation ? ? ?Ambulation assist ? ?   ? ?Assist level: Supervision/Verbal cueing ?Assistive device: Walker-rolling ?Max distance: 55'  ? ?Walk 10 feet activity ? ? ?Assist ?   ? ?Assist level: Supervision/Verbal cueing ?Assistive device: Walker-rolling  ? ?Walk 50 feet activity ? ? ?Assist Walk 50 feet with 2 turns activity did not occur: Safety/medical concerns ? ?Assist level: Supervision/Verbal cueing ?Assistive  device: Walker-rolling  ? ? ?Walk 150 feet activity ? ? ?Assist Walk 150 feet activity did not occur: Safety/medical concerns ? ?Assist level: Supervision/Verbal cueing ?Assistive device: Walker-rolling ?  ? ?Walk 10 feet on uneven surface  ?activity ? ? ?Assist Walk 10 feet on uneven surfaces activity did not occur: Safety/medical concerns ? ? ?Assist level: Supervision/Verbal cueing ?Assistive device: Walker-rolling  ? ?Wheelchair ? ? ? ? ?Assist Is the patient using a wheelchair?: No ?Type of Wheelchair: Manual ?  ? ?Wheelchair assist level: Maximal Assistance - Patient 25 -  49% ?Max wheelchair distance: 20  ? ? ?Wheelchair 50 feet with 2 turns activity ? ? ? ?Assist ? ?  ?  ? ? ?Assist Level: Maximal Assistance - Patient 25 - 49% (per PT documentation)  ? ?Wheelchair 150 feet activity  ? ? ? ?Assist ?   ? ? ?Assist Level: Total Assistance - Patient < 25% (per PT documentation)  ? ?Blood pressure (!) 150/70, pulse (!) 58, temperature 97.6 ?F (36.4 ?C), resp. rate 17, height 5' 7.01" (1.702 m), weight 101.4 kg, SpO2 100 %. ? ?Medical Problem List and Plan: ?1. 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          dc to SNF today.  ? -f/u with primary. No need to see me in office. ?2.  Antithrombotics: ?-DVT/anticoagulation:  Pharmaceutical: Heparin ?   ?            -antiplatelet therapy: -now on Plavix alone    ?3. Pain Management: Tylenol as needed ?-continue aspercreme for left shoulder ?-can use muscle rub also ?  -reviewed stretches HE needs to work on while by himself in room ?   Subacromial left shoulder injection 1/31 with benefit ?  -minimal pain right now ?4. Mood and sleep/wake:   ?   -now off ritalin ?            -antipsychotic agents: N/A ?5. Neuropsych: This patient is capable of making decisions on his own behalf. ?6. Skin/Wound Care: Routine skin checks ?7. Fluids/Electrolytes/Nutrition:    ? ? Patient looks like he is at his baseline for renal function, urine output increasing ?  ?   ?BMP Latest Ref Rng & Units 06/17/2021 06/11/2021 06/08/2021  ?Glucose 70 - 99 mg/dL 081(K) 481(E) 563(J)  ?BUN 8 - 23 mg/dL 49(F) 02(O) 37(C)  ?Creatinine 0.61 - 1.24 mg/dL 5.88 5.02 7.74  ?Sodium 135 - 145 mmol/L 137 137 139  ?Potassium 3.5 - 5.1 mmol/L 4.5 4.7 4.8  ?Chloride 98 - 111 mmol/L 107 107 109  ?CO2 22 - 32 mmol/L 25 23 24   ?Calcium 8.9 - 10.3 mg/dL 8.3(L) 8.5(L) 8.7(L)  ?  ? ?8.  Uncontrolled diabetes mellitus with hyperglycemia.  Hemoglobin A1c  9.9.   ?NovoLog 3 units 3 times daily, increased to 4 3 times daily on 2/26 ?CBG (last 3)  ?Recent Labs  ?  07/01/21 ?1643 07/01/21 ?2100 07/02/21 ?0627  ?GLUCAP 112* 162* 87  ?3/9 reasonable control.  Have adjusted am levemir to 12u, continue 10u qpm ? ?9.  Hypertension.  Lisinopril 40 mg daily, Toprol-XL 50 mg daily, hydralazine 50 mg every 8 hours, Hygroton 25 mg daily.   ?Vitals:  ? 07/01/21 2236 07/02/21 0525  ?BP: (!) 157/44 (!) 150/70  ?Pulse: (!) 55 (!) 58  ?Resp:  17  ?Temp:  97.6 ?F (36.4 ?  C)  ?SpO2:  100%  ?  hydralazine to 75mg  TID ?  clonidine to 0.2 mg  tid ? 3/7 increased hydralazine to 100mg  tid with improvement ?10.  Hyperlipidemia.  continue Lipitor ?11.  History of gout.  continue Allopurinol.  Monitor for any gout flareups  ? 13.  Aortic stenosis- OP f/u with cardiology  ?14. Bradycardia:  lopressor to 25mg . HR in 60's ?  ? ?LOS: ?54 days ?A FACE TO FACE EVALUATION WAS PERFORMED ? ?Ranelle OysterZachary T Shamirah Ivan ?07/02/2021, 9:26 AM  ? ? ? ?

## 2021-07-03 NOTE — Progress Notes (Signed)
Inpatient Rehabilitation Care Coordinator ?Discharge Note  ? ?Patient Details  ?Name: Manuel Chapman ?MRN: [id number] ?Date of Birth: Jun 06, 1945 ? ? ?Discharge location: D/c to SNF- [facility] ? ?Length of Stay: 54 days ? ?Discharge activity level: Supervision ? ?Home/community participation: Limited ? ?Patient response [place]:[id number] Literacy - How often do you need to have someone help you when you read instructions, pamphlets, or other written material from your doctor or pharmacy?: Often ? ?Patient response [place]:[id number] Isolation - How often do you feel lonely or isolated from those around you?: Patient unable to respond ? ?Services provided included: MD, RD, PT, OT, SLP, CM, Pharmacy, Neuropsych, SW, TR, RN ? ?Financial Services:  ?Field seismologist Utilized: Other (Comment) (LTC Medicaid) ?[employer] Medicare ? ?Choices offered to/list presented to: Yes ? ?Follow-up services arranged: N/A ? ? ?Patient response to transportation need: ?Is the patient able to respond to transportation needs?: Yes ?In the past 12 months, has lack of transportation kept you from medical appointments or from getting medications?: No ?In the past 12 months, has lack of transportation kept you from meetings, work, or from getting things needed for daily living?: No ? ? ?Comments (or additional information): ? ?Patient/Family verbalized understanding of follow-up arrangements:  Yes ? ?Individual responsible for coordination of the follow-up plan: contact pt dtr [name] or SIL Alecia Lemming ? ?Confirmed correct DME delivered: Gretchen Short 07/03/2021   ? ?Gretchen Short ?

## 2021-08-30 NOTE — Progress Notes (Signed)
? ? ?PATIENT: Manuel Chapman ?DOB: 04/11/1946 ? ?REASON FOR VISIT: follow up ?HISTORY FROM: patient ?PRIMARY NEUROLOGIST: Dr. Pearlean Brownie ? ?Chief Complaint  ?Patient presents with  ? Follow-up  ?  Pt in 4 with son  pt is here for stroke follow up pt states that he is having pain on left side of body .   ? ? ? ?HISTORY OF PRESENT ILLNESS: ?Today 08/30/21: ? ?Manuel Chapman is a 76 year old male with a history of right PICA infarct.  He returns today for follow-up. ? ?Patient reports he is currently at [facility]. He is unsure if its just for rehab or if its permenant.  ? ?Discharged on ASA and Plavix. Now he is just on Plavix. LDL was169 and lipitor 80 mg. Agreed to 30 day cardiac monitor but this has not been done.  ? ?Denies any stroke like symptoms. He feels that he has no residual symptoms. Reports pain in the left arm and left leg when it moves. Only for seconds. He is doing PT/OT/ST at [facility] place.  ? ?Came to the visit today alone.  ? 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 fib. ?LDL 169 ?HgbA1c 9.9 ?VTE prophylaxis - SCDs ?aspirin 81 mg daily prior to admission, now on aspirin 81 mg daily and clopidogrel 75 mg daily DAPT for 3 weeks and  then Plavix alone.  ? ?HISTORY (copied from Dr. Roda Shutters) 76 year old male with history of diabetes, hypertension, gout, stroke admitted for dizziness, nausea vomiting. ?  ?In 11/2019 patient was admitted for ICH and IVH, however further MRI showed had stroke at left frontal parietal, left CR and right temporal punctate infarcts with likely hemorrhagic transformation instead of ICH.  CTA head and neck unremarkable.  EF 55 to 60%.  LDL 90, A1c 13.3.  Put on aspirin 81 and Lipitor 20, discharge to SNF. ?  ?On current admission CT showed right cerebellar infarct, repeat CT stable.  MRI showed right PICA infarct with patient ventricles.  Carotid Doppler negative.  EF 40 to 45%.  CTA head and neck showed right ICA siphon mild to moderate stenosis.  UDS negative.  LDL 169, A1c 9.9.  Patient admitted not compliant with statin medication at home.  Creatinine 0.79 ?  ?On exam, patient sitting in chair, no family at bedside.  Awake alert, orientated x3.  No aphasia, follows simple commands, able to name and repeat, however moderate dysarthria due to poor denture.  Moving all extremities symmetrically, sensation symmetrical, finger-to-nose and heel-to-shin grossly intact, no ataxia. ?  ?Etiology for patient stroke likely embolic pattern given no severe vascular abnormality at right vertebral artery.  However, patient does have multiple uncontrolled risk factors including diabetes and hyperlipidemia.  Patient declined loop recorder but okay with 30-day cardiac event monitoring as outpatient.  Recommend aspirin 81 and Plavix 75 DAPT for 3 weeks  and then Plavix alone.  Continue Lipitor 80.  Aggressive stroke risk factor modification.  PT/OT recommend CIR ? ?REVIEW OF SYSTEMS: Out of a complete 14 system review of symptoms, the patient complains only of the following symptoms, and all other reviewed systems are negative. ? ?ALLERGIES: ?No Known Allergies ? ?HOME MEDICATIONS: ?Outpatient Medications Prior to Visit  ?Medication Sig  Dispense Refill  ? acetaminophen (TYLENOL) 325 MG tablet Take 2 tablets (650 mg total) by mouth every 4 (four) hours as needed for mild pain (or temp > 37.5 C (99.5 F)).    ? allopurinol (ZYLOPRIM) 300 MG tablet Take 300 mg by mouth daily.    ? atorvastatin (LIPITOR) 80 MG tablet Take 1 tablet (80 mg total) by mouth daily.    ? chlorthalidone (HYGROTON) 25 MG tablet Take 25 mg by mouth daily.    ? cloNIDine (CATAPRES) 0.2 MG tablet Take 1 tablet (0.2 mg total) by mouth 3 (three) times daily.    ? clopidogrel (PLAVIX) 75 MG tablet Take 1 tablet (75 mg total) by mouth daily.    ? hydrALAZINE (APRESOLINE) 100 MG tablet Take 1 tablet (100 mg total) by mouth 4 (four) times daily.    ? insulin detemir (LEVEMIR) 100 UNIT/ML injection Inject 0.1 mLs (10 Units total) into the skin daily. 10 mL 11  ? insulin detemir (LEVEMIR) 100 UNIT/ML injection Inject 0.12 mLs (12 Units total) into the skin daily. 10 mL 11  ? lisinopril (ZESTRIL) 40 MG tablet Take 1 tablet (40 mg total) by mouth daily. 30 tablet 1  ? metoprolol succinate (TOPROL-XL) 25 MG 24 hr tablet Take 1 tablet (25 mg total) by mouth daily.    ? Multiple Vitamin (MULTIVITAMIN WITH MINERALS) TABS tablet Take 1 tablet by mouth daily.    ? ?No facility-administered medications prior to visit.  ? ? ?PAST MEDICAL HISTORY: ?Past Medical History:  ?Diagnosis Date  ? CVA (cerebral vascular accident) (HCC) 2020  ? Diabetes mellitus   ? Hypertension   ? ICH (intracerebral hemorrhage) (HCC) 11/2019  ? ? ?PAST SURGICAL HISTORY: ?No past surgical history on file. ? ?FAMILY HISTORY: ?Family History  ?Problem Relation Age of Onset  ? Diabetes Neg Hx   ? Stroke Neg Hx   ? ? ?SOCIAL HISTORY: ?Social History  ? ?Socioeconomic History  ? Marital status: Single  ?  Spouse name: Not on file  ? Number of children: Not on file  ? Years of education: Not on file  ? Highest education level: Not on file  ?Occupational History  ? Occupation: retired  ?Tobacco Use  ? Smoking status: Former  ?   Packs/day: 1.00  ?  Years: 10.00  ?  Pack years: 10.00  ?  Types: Cigarettes  ?  Quit date: 1998  ?  Years since quitting: 25.3  ? Smokeless tobacco: Never  ?Substance and Sexual Activity  ? Alcohol use: Yes  ?  Comment: not much, h/o heavy beer use  ? Drug use: No  ? Sexual activity: Not on file  ?Other Topics Concern  ? Not on file  ?Social History Narrative  ? Not on file  ? ?Social Determinants of Health  ? ?Financial Resource Strain: Not on file  ?Food Insecurity: Not on file  ?Transportation Needs: Not on file  ?Physical Activity: Not on file  ?Stress: Not on file  ?Social Connections: Not on file  ?Intimate Partner Violence: Not on file  ? ? ? ? ?PHYSICAL EXAM ? ?Vitals:  ? 08/31/21 0850  ?  BP: (!) 109/56  ?Pulse: 68  ?Weight: 209 lb (94.8 kg)  ?Height: 5\' 6"  (1.676 m)  ? ?Body mass index is 33.73 kg/m?. ? ?Generalized: Well developed, in no acute distress  ? ?Neurological examination  ?Mentation: Alert oriented to time, place, history taking. Follows all commands speech mildly dysarthric ?Cranial nerve II-XII: Pupils were equal round reactive to light. Extraocular movements were full, visual field were full on confrontational test. Facial sensation and strength were normal. Uvula tongue midline. Head turning and shoulder shrug  were normal and symmetric. ?Motor: The motor testing reveals 5 over 5 strength of all 4 extremities. Good symmetric motor tone is noted throughout.  ?Sensory: Sensory testing is intact to soft touch on all 4 extremities. No evidence of extinction is noted.  ?Coordination: Cerebellar testing reveals good finger-nose-finger and heel-to-shin bilaterally.  ?Gait and station patient in a wheelchair today.  He did not bring his walker with him therefore we did not attempt to ambulate. ? ? ?DIAGNOSTIC DATA (LABS, IMAGING, TESTING) ?- I reviewed patient records, labs, notes, testing and imaging myself where available. ? ?Lab Results  ?Component Value Date  ? WBC 5.8 06/22/2021  ? HGB 10.1 (L)  06/22/2021  ? HCT 30.4 (L) 06/22/2021  ? MCV 89.4 06/22/2021  ? PLT 182 06/22/2021  ? ?   ?Component Value Date/Time  ? NA 137 06/17/2021 0607  ? K 4.5 06/17/2021 0607  ? CL 107 06/17/2021 0607  ? CO2 25 06/17/18

## 2021-08-31 ENCOUNTER — Encounter: Payer: Self-pay | Admitting: Adult Health

## 2021-08-31 ENCOUNTER — Ambulatory Visit (INDEPENDENT_AMBULATORY_CARE_PROVIDER_SITE_OTHER): Payer: Medicare HMO | Admitting: Adult Health

## 2021-08-31 ENCOUNTER — Other Ambulatory Visit: Payer: Self-pay | Admitting: Adult Health

## 2021-08-31 ENCOUNTER — Encounter: Payer: Self-pay | Admitting: *Deleted

## 2021-08-31 VITALS — BP 109/56 | HR 68 | Ht 66.0 in | Wt 209.0 lb

## 2021-08-31 DIAGNOSIS — E785 Hyperlipidemia, unspecified: Secondary | ICD-10-CM | POA: Diagnosis not present

## 2021-08-31 DIAGNOSIS — E1159 Type 2 diabetes mellitus with other circulatory complications: Secondary | ICD-10-CM | POA: Diagnosis not present

## 2021-08-31 DIAGNOSIS — Z794 Long term (current) use of insulin: Secondary | ICD-10-CM

## 2021-08-31 DIAGNOSIS — I639 Cerebral infarction, unspecified: Secondary | ICD-10-CM | POA: Diagnosis not present

## 2021-08-31 DIAGNOSIS — I4891 Unspecified atrial fibrillation: Secondary | ICD-10-CM

## 2021-08-31 NOTE — Progress Notes (Signed)
Patient ID: Manuel Chapman, male   DOB: [birth year], 76 y.o.   MRN: [id number] ?Patient enrolled for [employer] to ship a 30 day cardiac event monitor to his address on file. ?

## 2021-08-31 NOTE — Progress Notes (Signed)
I agree with the above plan 

## 2021-09-09 ENCOUNTER — Encounter: Payer: Self-pay | Admitting: Physical Medicine & Rehabilitation

## 2021-09-09 ENCOUNTER — Encounter: Payer: Medicare HMO | Attending: Physical Medicine & Rehabilitation | Admitting: Physical Medicine & Rehabilitation

## 2021-09-09 VITALS — BP 120/56 | HR 61 | Ht 66.0 in | Wt 212.8 lb

## 2021-09-09 DIAGNOSIS — I6330 Cerebral infarction due to thrombosis of unspecified cerebral artery: Secondary | ICD-10-CM | POA: Insufficient documentation

## 2021-09-09 DIAGNOSIS — M7582 Other shoulder lesions, left shoulder: Secondary | ICD-10-CM | POA: Insufficient documentation

## 2021-09-09 NOTE — Patient Instructions (Addendum)
PLEASE FEEL FREE TO CALL OUR OFFICE WITH ANY PROBLEMS OR QUESTIONS [phone]) ? ? ?I WANT YOU TO STAY ACTIVE!  WALK EVERY DAY! ? ? ? ?                ?

## 2021-09-09 NOTE — Progress Notes (Signed)
? ?Subjective:  ? ? Patient ID: Manuel Chapman, male    DOB: 06-Sep-1945, 76 y.o.   MRN: [id number] ? ?HPI ? ?[name] is here in follow up of his CVA. He is at [facility], converting to rest home level care currently.  He states that he is doing things for himself at home such as his dressing and basic bathing and toileting.  He tells me he uses his wheelchair to get around within his room and for his personal tasks.  He may get up on his walker and walk with staff around the facility a few times a week.  His son is with him today and it sounds as if he is often willing to let others help him when he could perform a task on his own. ? ?He brought his medical records from the facility today and his CBGs appear a bit labile.  He is occasionally having some sweets as a reward.  He tells me they are working on better control of his sugars.  He denies any issues with his bowels or bladder.  His appetite has been good.  He is sleeping well.  He feels that his left shoulder is improved from a pain standpoint although it still is a bit tight at end range of motion. ? ?Pain Inventory ?Average Pain 0 ?Pain Right Now 0 ?My pain is  no pain ? ?LOCATION OF PAIN  no pain ? ?BOWEL ?Number of stools per week: 7 ?Oral laxative use No  ?Type of laxative . ?Enema or suppository use No  ?History of colostomy No  ?Incontinent No  ? ?BLADDER ?Pads ?In and out cath, frequency . ?Able to self cath  . ?Bladder incontinence No  ?Frequent urination Yes  ?Leakage with coughing No  ?Difficulty starting stream No  ?Incomplete bladder emptying No  ? ? ?Mobility ?use a walker ?how many minutes can you walk? 5-10 ?ability to climb steps?  yes ?do you drive?  no ?use a wheelchair ?needs help with transfers ? ?Function ?disabled: date disabled . ?I need assistance with the following:  dressing, bathing, toileting, meal prep, household duties, and shopping ? ?Neuro/Psych ?bladder control problems ?trouble walking ? ?Prior Studies ?Hospital  f/u ? ?Physicians involved in your care ?Hospital f/u ? ? ?Family History  ?Problem Relation Age of Onset  ? Diabetes Neg Hx   ? Stroke Neg Hx   ? ?Social History  ? ?Socioeconomic History  ? Marital status: Single  ?  Spouse name: Not on file  ? Number of children: Not on file  ? Years of education: Not on file  ? Highest education level: Not on file  ?Occupational History  ? Occupation: retired  ?Tobacco Use  ? Smoking status: Former  ?  Packs/day: 1.00  ?  Years: 10.00  ?  Pack years: 10.00  ?  Types: Cigarettes  ?  Quit date: 1998  ?  Years since quitting: 25.3  ? Smokeless tobacco: Never  ?Substance and Sexual Activity  ? Alcohol use: Yes  ?  Comment: not much, h/o heavy beer use  ? Drug use: No  ? Sexual activity: Not on file  ?Other Topics Concern  ? Not on file  ?Social History Narrative  ? Not on file  ? ?Social Determinants of Health  ? ?Financial Resource Strain: Not on file  ?Food Insecurity: Not on file  ?Transportation Needs: Not on file  ?Physical Activity: Not on file  ?Stress: Not on file  ?Social Connections:  Not on file  ? ?No past surgical history on file. ?Past Medical History:  ?Diagnosis Date  ? CVA (cerebral vascular accident) ([place]) 2020  ? Diabetes mellitus   ? Hypertension   ? ICH (intracerebral hemorrhage) ([place]) 11/2019  ? ?BP (!) 120/56   Pulse 61   Ht 5\' 6"  (1.676 m)   Wt 212 lb 12.8 oz (96.5 kg)   SpO2 98%   BMI 34.35 kg/m?  ? ?Opioid Risk Score:   ?Fall Risk Score:  `1 ? ?Depression screen PHQ 2/9 ? ? ?  09/09/2021  ? 10:50 AM  ?Depression screen PHQ 2/9  ?Decreased Interest 2  ?Down, Depressed, Hopeless 0  ?PHQ - 2 Score 2  ?Altered sleeping 0  ?Tired, decreased energy 2  ?Change in appetite 0  ?Feeling bad or failure about yourself  0  ?Trouble concentrating 0  ?Moving slowly or fidgety/restless 0  ?Suicidal thoughts 0  ?PHQ-9 Score 4  ?Difficult doing work/chores Not difficult at all  ?  ?Review of Systems  ?Genitourinary:  Positive for frequency.  ?Musculoskeletal:  Positive  for gait problem.  ?All other systems reviewed and are negative. ? ?   ?Objective:  ? Physical Exam ? ?General: Alert and oriented x 3, No apparent distress. OBESE ?HEENT: Head is normocephalic, atraumatic, PERRLA, EOMI, sclera anicteric, oral mucosa pink and moist, dentition intact, ext ear canals clear,  ?Neck: Supple without JVD or lymphadenopathy ?Heart: Reg rate and rhythm. No murmurs rubs or gallops ?Chest: CTA bilaterally without wheezes, rales, or rhonchi; no distress ?Abdomen: Soft, non-tender, non-distended, bowel sounds positive. ?Extremities: No clubbing, cyanosis, or edema. Pulses are 2+ ?Psych: Pt's affect is appropriate. Pt is cooperative ?Skin: Clean and intact without signs of breakdown ?Neuro:  Alert and oriented x 3. Normal insight and awareness. Intact Memory. Normal language and speech. Cranial nerve exam unremarkable.  I stood him today and he needs extra time to move from sit to stand position.  He tends to stand with a leaning head forward posture.  With some cueing he is able to extend his back and head a bit.  Required min assist to contact-guard for transfer and for balance with standing.  Knees were locked in extension.  Overall his motor function is 4 - to 4 out of 5 in all 4 limbs.  Sensory exam is grossly intact. ?Musculoskeletal: Patient's left shoulder demonstrates improved range of motion.  Is able to passively abduct him to 90 degrees.  He improved internal and external rotation of at least 30 to 40 degrees with minimal pain.  Once we got past 90 degrees or so his left shoulder began to bother him a bit. ? ? ? ? ? ?Medical Problem List and Plan: ?1. 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         -has converted to [facility] level care. . ?        -w/c for mobility within room ?      -walker for therapeutic movement. ?-Discussed with the patient  that he needs to continue doing more for himself.  He is easily willing to let others help him.  In order to maintain level of independence he needs to stay active.  Encouraged regular walking on his walker.  He should continue with his independence with transfers and personal care tasks. ?2.  A . Pain Management: Tylenol as  needed ?-improved rom left shoulder, still some limitations in abduction/er/ir ?-better after injection in hospital ?-Discussed the importance of maintaining range of motion and continue with a home exercise program ?3.  Diabetes: Sugars still appear a bit labile.  I will leave the management up to the facility he is at.  Discussed the importance of maintaining appropriate diet and avoid excessive sugars and carbohydrates.   ?4.  Hypertension.  Blood pressure appears controlled on current regimen: Continue ? ? ? ? ?Fifteen minutes of face to face patient care time were spent during this visit. All questions were encouraged and answered.  At this point he has general medical issues that need following.  He can be seen by his primary or the facility physician for those.  Follow up with me PRN. .  ?   ? ?

## 2021-09-15 ENCOUNTER — Other Ambulatory Visit: Payer: Self-pay

## 2021-09-15 ENCOUNTER — Inpatient Hospital Stay (HOSPITAL_COMMUNITY)
Admission: EM | Admit: 2021-09-15 | Discharge: 2021-09-18 | DRG: 309 | Disposition: A | Discharge status: Skilled Nursing Facility | Payer: Medicare HMO | Attending: Family Medicine | Admitting: Family Medicine

## 2021-09-15 ENCOUNTER — Inpatient Hospital Stay (HOSPITAL_COMMUNITY): Payer: Medicare HMO

## 2021-09-15 ENCOUNTER — Encounter (HOSPITAL_COMMUNITY): Payer: Self-pay | Admitting: Internal Medicine

## 2021-09-15 DIAGNOSIS — Z79899 Other long term (current) drug therapy: Secondary | ICD-10-CM | POA: Diagnosis not present

## 2021-09-15 DIAGNOSIS — R4189 Other symptoms and signs involving cognitive functions and awareness: Secondary | ICD-10-CM | POA: Diagnosis present

## 2021-09-15 DIAGNOSIS — I69319 Unspecified symptoms and signs involving cognitive functions following cerebral infarction: Secondary | ICD-10-CM

## 2021-09-15 DIAGNOSIS — I110 Hypertensive heart disease with heart failure: Secondary | ICD-10-CM | POA: Diagnosis present

## 2021-09-15 DIAGNOSIS — R748 Abnormal levels of other serum enzymes: Secondary | ICD-10-CM

## 2021-09-15 DIAGNOSIS — R531 Weakness: Secondary | ICD-10-CM

## 2021-09-15 DIAGNOSIS — E1142 Type 2 diabetes mellitus with diabetic polyneuropathy: Secondary | ICD-10-CM | POA: Diagnosis present

## 2021-09-15 DIAGNOSIS — R001 Bradycardia, unspecified: Principal | ICD-10-CM | POA: Diagnosis present

## 2021-09-15 DIAGNOSIS — M109 Gout, unspecified: Secondary | ICD-10-CM | POA: Diagnosis present

## 2021-09-15 DIAGNOSIS — D509 Iron deficiency anemia, unspecified: Secondary | ICD-10-CM | POA: Diagnosis present

## 2021-09-15 DIAGNOSIS — E785 Hyperlipidemia, unspecified: Secondary | ICD-10-CM | POA: Diagnosis present

## 2021-09-15 DIAGNOSIS — E872 Acidosis, unspecified: Secondary | ICD-10-CM | POA: Diagnosis present

## 2021-09-15 DIAGNOSIS — R011 Cardiac murmur, unspecified: Secondary | ICD-10-CM | POA: Diagnosis not present

## 2021-09-15 DIAGNOSIS — Z794 Long term (current) use of insulin: Secondary | ICD-10-CM

## 2021-09-15 DIAGNOSIS — I472 Ventricular tachycardia, unspecified: Secondary | ICD-10-CM | POA: Diagnosis not present

## 2021-09-15 DIAGNOSIS — I441 Atrioventricular block, second degree: Secondary | ICD-10-CM

## 2021-09-15 DIAGNOSIS — T447X5D Adverse effect of beta-adrenoreceptor antagonists, subsequent encounter: Secondary | ICD-10-CM | POA: Diagnosis not present

## 2021-09-15 DIAGNOSIS — D500 Iron deficiency anemia secondary to blood loss (chronic): Secondary | ICD-10-CM

## 2021-09-15 DIAGNOSIS — Z87891 Personal history of nicotine dependence: Secondary | ICD-10-CM

## 2021-09-15 DIAGNOSIS — I350 Nonrheumatic aortic (valve) stenosis: Secondary | ICD-10-CM | POA: Diagnosis present

## 2021-09-15 DIAGNOSIS — Z8673 Personal history of transient ischemic attack (TIA), and cerebral infarction without residual deficits: Secondary | ICD-10-CM | POA: Diagnosis not present

## 2021-09-15 DIAGNOSIS — T447X5A Adverse effect of beta-adrenoreceptor antagonists, initial encounter: Secondary | ICD-10-CM | POA: Diagnosis present

## 2021-09-15 DIAGNOSIS — Z7902 Long term (current) use of antithrombotics/antiplatelets: Secondary | ICD-10-CM

## 2021-09-15 DIAGNOSIS — I5022 Chronic systolic (congestive) heart failure: Secondary | ICD-10-CM | POA: Diagnosis present

## 2021-09-15 DIAGNOSIS — R627 Adult failure to thrive: Secondary | ICD-10-CM | POA: Diagnosis present

## 2021-09-15 LAB — MRSA NEXT GEN BY PCR, NASAL: MRSA by PCR Next Gen: NOT DETECTED

## 2021-09-15 LAB — GLUCOSE, CAPILLARY: Glucose-Capillary: 108 mg/dL — ABNORMAL HIGH (ref 70–99)

## 2021-09-15 LAB — CBC WITH DIFFERENTIAL/PLATELET
Abs Immature Granulocytes: 0.02 10*3/uL (ref 0.00–0.07)
Basophils Absolute: 0 10*3/uL (ref 0.0–0.1)
Basophils Relative: 0 %
Eosinophils Absolute: 0.1 10*3/uL (ref 0.0–0.5)
Eosinophils Relative: 2 %
HCT: 29.1 % — ABNORMAL LOW (ref 39.0–52.0)
Hemoglobin: 9.4 g/dL — ABNORMAL LOW (ref 13.0–17.0)
Immature Granulocytes: 0 %
Lymphocytes Relative: 20 %
Lymphs Abs: 1.5 10*3/uL (ref 0.7–4.0)
MCH: 29.2 pg (ref 26.0–34.0)
MCHC: 32.3 g/dL (ref 30.0–36.0)
MCV: 90.4 fL (ref 80.0–100.0)
Monocytes Absolute: 0.4 10*3/uL (ref 0.1–1.0)
Monocytes Relative: 6 %
Neutro Abs: 5.1 10*3/uL (ref 1.7–7.7)
Neutrophils Relative %: 72 %
Platelets: 266 10*3/uL (ref 150–400)
RBC: 3.22 MIL/uL — ABNORMAL LOW (ref 4.22–5.81)
RDW: 16.3 % — ABNORMAL HIGH (ref 11.5–15.5)
WBC: 7.1 10*3/uL (ref 4.0–10.5)
nRBC: 0 % (ref 0.0–0.2)

## 2021-09-15 LAB — BASIC METABOLIC PANEL
Anion gap: 4 — ABNORMAL LOW (ref 5–15)
BUN: 24 mg/dL — ABNORMAL HIGH (ref 8–23)
CO2: 20 mmol/L — ABNORMAL LOW (ref 22–32)
Calcium: 7.8 mg/dL — ABNORMAL LOW (ref 8.9–10.3)
Chloride: 112 mmol/L — ABNORMAL HIGH (ref 98–111)
Creatinine, Ser: 1.21 mg/dL (ref 0.61–1.24)
GFR, Estimated: >60 mL/min (ref >60)
Glucose, Bld: 113 mg/dL — ABNORMAL HIGH (ref 70–99)
Potassium: 4.2 mmol/L (ref 3.5–5.1)
Sodium: 136 mmol/L (ref 135–145)

## 2021-09-15 LAB — TROPONIN I (HIGH SENSITIVITY)
Troponin I (High Sensitivity): 24 ng/L — ABNORMAL HIGH (ref <18)
Troponin I (High Sensitivity): 69 ng/L — ABNORMAL HIGH (ref <18)

## 2021-09-15 LAB — COMPREHENSIVE METABOLIC PANEL
ALT: 24 U/L (ref 0–44)
AST: 30 U/L (ref 15–41)
Albumin: 2.7 g/dL — ABNORMAL LOW (ref 3.5–5.0)
Alkaline Phosphatase: 132 U/L — ABNORMAL HIGH (ref 38–126)
Anion gap: 7 (ref 5–15)
BUN: 24 mg/dL — ABNORMAL HIGH (ref 8–23)
CO2: 20 mmol/L — ABNORMAL LOW (ref 22–32)
Calcium: 8.5 mg/dL — ABNORMAL LOW (ref 8.9–10.3)
Chloride: 108 mmol/L (ref 98–111)
Creatinine, Ser: 1.16 mg/dL (ref 0.61–1.24)
GFR, Estimated: >60 mL/min (ref >60)
Glucose, Bld: 119 mg/dL — ABNORMAL HIGH (ref 70–99)
Potassium: 3.7 mmol/L (ref 3.5–5.1)
Sodium: 135 mmol/L (ref 135–145)
Total Bilirubin: 0.7 mg/dL (ref 0.3–1.2)
Total Protein: 6.8 g/dL (ref 6.5–8.1)

## 2021-09-15 LAB — IRON AND TIBC
Iron: 36 ug/dL — ABNORMAL LOW (ref 45–182)
Saturation Ratios: 13 % — ABNORMAL LOW (ref 17.9–39.5)
TIBC: 281 ug/dL (ref 250–450)
UIBC: 245 ug/dL

## 2021-09-15 LAB — PROTIME-INR
INR: 0.9 (ref 0.8–1.2)
Prothrombin Time: 12.5 seconds (ref 11.4–15.2)

## 2021-09-15 LAB — ABO/RH: ABO/RH(D): O POS

## 2021-09-15 LAB — FERRITIN: Ferritin: 71 ng/mL (ref 24–336)

## 2021-09-15 LAB — TSH: TSH: 2.085 u[IU]/mL (ref 0.350–4.500)

## 2021-09-15 LAB — MAGNESIUM: Magnesium: 2 mg/dL (ref 1.7–2.4)

## 2021-09-15 MED ORDER — SODIUM CHLORIDE 0.9% FLUSH: 3.0000 mL | Freq: Two times a day (BID) | INTRAVENOUS | Status: DC

## 2021-09-15 MED ORDER — SODIUM CHLORIDE 0.9 % IV SOLN: 250.0000 mL | INTRAVENOUS | Status: DC | PRN

## 2021-09-15 MED ORDER — ALBUTEROL SULFATE (2.5 MG/3ML) 0.083% IN NEBU
2.5000 mg | INHALATION_SOLUTION | Freq: Once | RESPIRATORY_TRACT | Status: AC
  Administered 2021-09-15: 2.5 mg via RESPIRATORY_TRACT
  Filled 2021-09-15: qty 3

## 2021-09-15 MED ORDER — ACETAMINOPHEN 325 MG PO TABS: 650.0000 mg | ORAL_TABLET | ORAL | Status: DC | PRN

## 2021-09-15 MED ORDER — SODIUM CHLORIDE 0.9 % IV SOLN: INTRAVENOUS | Status: AC

## 2021-09-15 MED ORDER — INSULIN ASPART 100 UNIT/ML IJ SOLN
0.0000 [IU] | Freq: Three times a day (TID) | INTRAMUSCULAR | Status: DC
  Administered 2021-09-16 – 2021-09-17 (×2): 2 [IU] via SUBCUTANEOUS
  Administered 2021-09-17: 3 [IU] via SUBCUTANEOUS

## 2021-09-15 MED ORDER — SODIUM CHLORIDE 0.9% FLUSH: 3.0000 mL | INTRAVENOUS | Status: DC | PRN

## 2021-09-15 MED ORDER — ENOXAPARIN SODIUM 40 MG/0.4ML IJ SOSY
40.0000 mg | PREFILLED_SYRINGE | INTRAMUSCULAR | Status: DC
  Administered 2021-09-15 – 2021-09-17 (×3): 40 mg via SUBCUTANEOUS
  Filled 2021-09-15 (×3): qty 0.4

## 2021-09-15 MED ORDER — PAMIDRONATE DISODIUM 30 MG/10ML IV SOLN
60.0000 mg | Freq: Once | INTRAVENOUS | Status: DC
Start: 2021-09-15 — End: 2021-09-15

## 2021-09-15 MED ORDER — CLOPIDOGREL BISULFATE 75 MG PO TABS
75.0000 mg | ORAL_TABLET | Freq: Every day | ORAL | Status: DC
  Administered 2021-09-16 – 2021-09-18 (×3): 75 mg via ORAL
  Filled 2021-09-15 (×3): qty 1

## 2021-09-15 MED ORDER — ALLOPURINOL 300 MG PO TABS
300.0000 mg | ORAL_TABLET | Freq: Every day | ORAL | Status: DC
  Administered 2021-09-16 – 2021-09-18 (×3): 300 mg via ORAL
  Filled 2021-09-15 (×3): qty 1

## 2021-09-15 MED ORDER — ATORVASTATIN CALCIUM 40 MG PO TABS
80.0000 mg | ORAL_TABLET | Freq: Every day | ORAL | Status: DC
  Administered 2021-09-15 – 2021-09-17 (×3): 80 mg via ORAL
  Filled 2021-09-15 (×3): qty 2

## 2021-09-15 MED ORDER — HYDRALAZINE HCL 25 MG PO TABS
25.0000 mg | ORAL_TABLET | Freq: Four times a day (QID) | ORAL | Status: DC | PRN
  Administered 2021-09-16 (×2): 25 mg via ORAL
  Filled 2021-09-15 (×2): qty 1

## 2021-09-15 MED ORDER — HYDRALAZINE HCL 100 MG PO TABS: 50.0000 mg | ORAL_TABLET | Freq: Four times a day (QID) | ORAL | Status: DC

## 2021-09-15 MED ORDER — ATROPINE SULFATE 0.4 MG/ML IV SOLN: 0.4000 mg | INTRAVENOUS | Status: DC | PRN

## 2021-09-15 MED ORDER — INSULIN DETEMIR 100 UNIT/ML ~~LOC~~ SOLN
12.0000 [IU] | Freq: Every day | SUBCUTANEOUS | Status: DC
  Administered 2021-09-16 – 2021-09-18 (×3): 12 [IU] via SUBCUTANEOUS
  Filled 2021-09-15 (×3): qty 0.12

## 2021-09-15 MED ORDER — ONDANSETRON HCL 4 MG/2ML IJ SOLN: 4.0000 mg | Freq: Four times a day (QID) | INTRAMUSCULAR | Status: DC | PRN

## 2021-09-15 MED ORDER — SODIUM CHLORIDE 0.9 % IV SOLN: INTRAVENOUS | Status: DC

## 2021-09-15 MED ORDER — ONDANSETRON HCL 4 MG PO TABS: 4.0000 mg | ORAL_TABLET | Freq: Three times a day (TID) | ORAL | Status: DC | PRN

## 2021-09-15 MED ORDER — SODIUM CHLORIDE 0.9 % IV BOLUS
500.0000 mL | Freq: Once | INTRAVENOUS | Status: AC
  Administered 2021-09-15: 500 mL via INTRAVENOUS

## 2021-09-15 MED ORDER — POTASSIUM CHLORIDE CRYS ER 20 MEQ PO TBCR
40.0000 meq | EXTENDED_RELEASE_TABLET | Freq: Once | ORAL | Status: AC
  Administered 2021-09-15: 40 meq via ORAL
  Filled 2021-09-15: qty 2

## 2021-09-15 NOTE — ED Notes (Signed)
RN unable to obtain IV access, second RN to attempt

## 2021-09-15 NOTE — Progress Notes (Signed)
Admission from the ED by bed awake and alert. Made comfortable on bed.

## 2021-09-15 NOTE — ED Triage Notes (Signed)
Pt arrives via [facility] from [facility] for possible GI bleed. PT has had diarrhea x3 days, when staff was changing him this morning they noted a "reddish tint" to the sheet and were concerned for possible blood in stool. Pt denies pain, has no complaints. Aox4, hx stroke w/ L side weakness

## 2021-09-15 NOTE — ED Notes (Signed)
Had a bowel movement, no bloody or black tarry stool noted. Vanita Panda MD aware

## 2021-09-15 NOTE — ED Notes (Signed)
ED TO INPATIENT HANDOFF REPORT  ED Nurse Name and Phone #: Fredricka Bonine RN  [phone]  S Name/Age/Gender Manuel Chapman 76 y.o. male Room/Bed: 003C/003C  Code Status   Code Status: Full Code  Home/SNF/Other Rehab Patient oriented to: self, place, time, and situation Is this baseline? Yes   Triage Complete: Triage complete  Chief Complaint Bradycardia [R00.1]  Triage Note Pt arrives via [facility] from [facility] for possible GI bleed. PT has had diarrhea x3 days, when staff was changing him this morning they noted a "reddish tint" to the sheet and were concerned for possible blood in stool. Pt denies pain, has no complaints. Aox4, hx stroke w/ L side weakness   Allergies No Known Allergies  Level of Care/Admitting Diagnosis ED Disposition     ED Disposition  Admit   Condition  --   Comment  Hospital [facility]: [facility] [[id number]]  Level of Care: Progressive [102]  Admit to Progressive based on following criteria: CARDIOVASCULAR & THORACIC of moderate stability with acute coronary syndrome symptoms/low risk myocardial infarction/hypertensive urgency/arrhythmias/heart failure potentially compromising stability and stable post cardiovascular intervention patients.  May admit patient to [facility] or [facility] if equivalent level of care is available:: No  Covid Evaluation: Asymptomatic - no recent exposure (last 10 days) testing not required  Diagnosis: Bradycardia [191850]  Admitting Physician: Emeline General [[id number]]  Attending Physician: Emeline General [[id number]]  Estimated length of stay: past midnight tomorrow  Certification:: I certify this patient will need inpatient services for at least 2 midnights          B Medical/Surgery History Past Medical History:  Diagnosis Date   CVA (cerebral vascular accident) (HCC) 2020   Diabetes mellitus    Hypertension    ICH (intracerebral hemorrhage) (HCC) 11/2019   No past surgical  history on file.   A IV Location/Drains/Wounds Patient Lines/Drains/Airways Status     Active Line/Drains/Airways     Name Placement date Placement time Site Days   Peripheral IV 09/15/21 20 G 1" Anterior;Right Forearm 09/15/21  1211  Forearm  less than 1            Intake/Output Last 24 hours  Intake/Output Summary (Last 24 hours) at 09/15/2021 1719 Last data filed at 09/15/2021 1302 Gross per 24 hour  Intake 500 ml  Output --  Net 500 ml    Labs/Imaging Results for orders placed or performed during the hospital encounter of 09/15/21 (from the past 48 hour(s))  Comprehensive metabolic panel     Status: Abnormal   Collection Time: 09/15/21 12:04 PM  Result Value Ref Range   Sodium 135 135 - 145 mmol/L   Potassium 3.7 3.5 - 5.1 mmol/L   Chloride 108 98 - 111 mmol/L   CO2 20 (L) 22 - 32 mmol/L   Glucose, Bld 119 (H) 70 - 99 mg/dL    Comment: Glucose reference range applies only to samples taken after fasting for at least 8 hours.   BUN 24 (H) 8 - 23 mg/dL   Creatinine, Ser 9.62 0.61 - 1.24 mg/dL   Calcium 8.5 (L) 8.9 - 10.3 mg/dL   Total Protein 6.8 6.5 - 8.1 g/dL   Albumin 2.7 (L) 3.5 - 5.0 g/dL   AST 30 15 - 41 U/L   ALT 24 0 - 44 U/L   Alkaline Phosphatase 132 (H) 38 - 126 U/L   Total Bilirubin 0.7 0.3 - 1.2 mg/dL   GFR, Estimated >83 >  60 mL/min    Comment: (NOTE) Calculated using the CKD-EPI Creatinine Equation (2021)    Anion gap 7 5 - 15    Comment: Performed at [facility], [address] [place], [place] [address]  CBC with Differential     Status: Abnormal   Collection Time: 09/15/21 12:04 PM  Result Value Ref Range   WBC 7.1 4.0 - 10.5 K/uL   RBC 3.22 (L) 4.22 - 5.81 MIL/uL   Hemoglobin 9.4 (L) 13.0 - 17.0 g/dL   HCT 60.429.1 (L) 54.039.0 - 98.152.0 %   MCV 90.4 80.0 - 100.0 fL   MCH 29.2 26.0 - 34.0 pg   MCHC 32.3 30.0 - 36.0 g/dL   RDW 19.116.3 (H) 47.811.5 - 29.515.5 %   Platelets 266 150 - 400 K/uL   nRBC 0.0 0.0 - 0.2 %   Neutrophils Relative % 72 %    Neutro Abs 5.1 1.7 - 7.7 K/uL   Lymphocytes Relative 20 %   Lymphs Abs 1.5 0.7 - 4.0 K/uL   Monocytes Relative 6 %   Monocytes Absolute 0.4 0.1 - 1.0 K/uL   Eosinophils Relative 2 %   Eosinophils Absolute 0.1 0.0 - 0.5 K/uL   Basophils Relative 0 %   Basophils Absolute 0.0 0.0 - 0.1 K/uL   Immature Granulocytes 0 %   Abs Immature Granulocytes 0.02 0.00 - 0.07 K/uL    Comment: Performed at [facility], [address] [place], [place] [address]  Protime-INR     Status: None   Collection Time: 09/15/21 12:04 PM  Result Value Ref Range   Prothrombin Time 12.5 11.4 - 15.2 seconds   INR 0.9 0.8 - 1.2    Comment: (NOTE) INR goal varies based on device and disease states. Performed at [facility], [address] [place], [place] [address]   Type and screen [facility]     Status: None   Collection Time: 09/15/21 12:04 PM  Result Value Ref Range   ABO/RH(D) O POS    Antibody Screen NEG    Sample Expiration      09/18/2021,2359 Performed at [facility], [address] [place], [place] [address]   Magnesium     Status: None   Collection Time: 09/15/21 12:04 PM  Result Value Ref Range   Magnesium 2.0 1.7 - 2.4 mg/dL    Comment: Performed at [facility], [address] [place], [place] [address]  ABO/Rh     Status: None   Collection Time: 09/15/21  3:05 PM  Result Value Ref Range   ABO/RH(D)      O POS Performed at [facility], [address] [place], [place] [address]    No results found.  Pending Labs Unresulted Labs (From admission, onward)     Start     Ordered   09/16/21 0500  Basic metabolic panel  Daily,   R       Comments: As Scheduled for 5 days    09/15/21 1609   09/15/21 1445  Urinalysis, Routine w reflex microscopic  Once,   URGENT        09/15/21 1444   09/15/21 1444  TSH  Add-on,   AD        09/15/21 1443            Vitals/Pain Today's Vitals   09/15/21 1400 09/15/21 1430 09/15/21 1500  09/15/21 1515  BP: 101/80 (!) 103/41 Marland Kitchen(!)  104/40 99/62  Pulse: 60 (!) 31 (!) 32 (!) 34  Resp: 18 15 13 17   Temp:      TempSrc:      SpO2: 92% 100% 100% 100%  PainSc:        Isolation Precautions No active isolations  Medications Medications  0.9 %  sodium chloride infusion ( Intravenous New Bag/Given 09/15/21 1620)  acetaminophen (TYLENOL) tablet 650 mg (has no administration in time range)  allopurinol (ZYLOPRIM) tablet 300 mg (has no administration in time range)  atorvastatin (LIPITOR) tablet 80 mg (has no administration in time range)  hydrALAZINE (APRESOLINE) tablet 25 mg (has no administration in time range)  insulin detemir (LEVEMIR) injection 12 Units (has no administration in time range)  ondansetron (ZOFRAN) tablet 4 mg (has no administration in time range)  clopidogrel (PLAVIX) tablet 75 mg (has no administration in time range)  sodium chloride flush (NS) 0.9 % injection 3 mL (has no administration in time range)  sodium chloride flush (NS) 0.9 % injection 3 mL (has no administration in time range)  0.9 %  sodium chloride infusion (has no administration in time range)  acetaminophen (TYLENOL) tablet 650 mg (has no administration in time range)  ondansetron (ZOFRAN) injection 4 mg (has no administration in time range)  enoxaparin (LOVENOX) injection 40 mg (has no administration in time range)  atropine injection 0.4 mg (has no administration in time range)  insulin aspart (novoLOG) injection 0-15 Units (has no administration in time range)  sodium chloride 0.9 % bolus 500 mL (0 mLs Intravenous Stopped 09/15/21 1302)  potassium chloride SA (KLOR-CON M) CR tablet 40 mEq (40 mEq Oral Given 09/15/21 1615)  albuterol (PROVENTIL) (2.5 MG/3ML) 0.083% nebulizer solution 2.5 mg (2.5 mg Nebulization Given 09/15/21 1617)    Mobility walks with device Low fall risk   Focused Assessments Cardiac Assessment Handoff:  Cardiac Rhythm: Heart block No results found for: CKTOTAL, CKMB,  CKMBINDEX, TROPONINI No results found for: DDIMER Does the Patient currently have chest pain? No    R Recommendations: See Admitting Provider Note  Report given to:   Additional Notes:

## 2021-09-15 NOTE — ED Provider Notes (Signed)
[facility] EMERGENCY DEPARTMENT Provider Note   CSN: [id number] Arrival date & time: 09/15/21  1133     History  Chief Complaint  Patient presents with   Diarrhea   Weakness    Manuel Chapman is a 76 y.o. male.  HPI Adult male with multiple medical issues including prior CVA, now a nursing home resident presents with staff concern for possible GI bleed.  History is obtained by the patient, EMS, largely.  Patient himself denies any complaints, dizziness, lightheadedness, dyspnea, abdominal pain.  He does acknowledge having loose stool for the past few days.  EMS reports that after the patient had several days of loose stool, staff reportedly saw a reddish tint and he was sent here for evaluation.  Patient has seemingly been compliant with all medications including Plavix, given his history of heart disease and stroke.    Home Medications Prior to Admission medications   Medication Sig Start Date End Date Taking? Authorizing Provider  acetaminophen (TYLENOL) 325 MG tablet Take 2 tablets (650 mg total) by mouth every 4 (four) hours as needed for mild pain (or temp > 37.5 C (99.5 F)). 05/09/21   Elgergawy, Silver Huguenin, MD  allopurinol (ZYLOPRIM) 300 MG tablet Take 300 mg by mouth daily.    [provider]  atorvastatin (LIPITOR) 80 MG tablet Take 1 tablet (80 mg total) by mouth daily. 06/22/21   Angiulli, Lavon Paganini, PA-C  chlorthalidone (HYGROTON) 25 MG tablet Take 25 mg by mouth daily. 12/24/20   [provider]  cloNIDine (CATAPRES) 0.2 MG tablet Take 1 tablet (0.2 mg total) by mouth 3 (three) times daily. 06/22/21   Angiulli, Lavon Paganini, PA-C  clopidogrel (PLAVIX) 75 MG tablet Take 1 tablet (75 mg total) by mouth daily. 05/10/21   Elgergawy, Silver Huguenin, MD  dexamethasone (DECADRON) 6 MG tablet Take 6 mg by mouth daily. 08/24/21   [provider]  doxycycline (VIBRAMYCIN) 100 MG capsule Take 100 mg by mouth 2 (two) times daily. 08/25/21   [provider]  GUAIFENESIN PO Take by mouth.    [provider]  hydrALAZINE (APRESOLINE) 100 MG tablet Take 1 tablet (100 mg total) by mouth 4 (four) times daily. 07/01/21   Angiulli, Lavon Paganini, PA-C  insulin detemir (LEVEMIR) 100 UNIT/ML injection Inject 0.1 mLs (10 Units total) into the skin daily. 06/22/21   Angiulli, Lavon Paganini, PA-C  insulin detemir (LEVEMIR) 100 UNIT/ML injection Inject 0.12 mLs (12 Units total) into the skin daily. 07/02/21   Angiulli, Lavon Paganini, PA-C  LAGEVRIO 200 MG CAPS capsule Take by mouth. 08/17/21   [provider]  lisinopril (ZESTRIL) 40 MG tablet Take 1 tablet (40 mg total) by mouth daily. 12/05/19   [name], NP  Menthol, Topical Analgesic, (BIOFREEZE) 4 % GEL Apply topically.    [provider]  metoprolol succinate (TOPROL-XL) 25 MG 24 hr tablet Take 1 tablet (25 mg total) by mouth daily. 06/22/21   Angiulli, Lavon Paganini, PA-C  Multiple Vitamin (MULTIVITAMIN WITH MINERALS) TABS tablet Take 1 tablet by mouth daily. 05/10/21   Elgergawy, Silver Huguenin, MD  ondansetron (ZOFRAN) 4 MG tablet Take 4 mg by mouth every 8 (eight) hours as needed. 08/10/21   [provider]      Allergies    Patient has no known allergies.    Review of Systems   Review of Systems  All other systems reviewed and are negative.  Physical Exam Updated Vital Signs BP 101/80  Pulse 60   Temp 97.9 F (36.6 C) (Oral)   Resp 18   SpO2 92%  Physical Exam Vitals and nursing note reviewed.  Constitutional:      General: He is not in acute distress.    Appearance: He is well-developed. He is ill-appearing. He is not toxic-appearing or diaphoretic.  HENT:     Head: Normocephalic and atraumatic.  Eyes:     Conjunctiva/sclera: Conjunctivae normal.  Cardiovascular:     Rate and Rhythm: Normal rate and regular rhythm.  Pulmonary:     Effort: Pulmonary effort is normal. No respiratory distress.     Breath sounds: No stridor.  Abdominal:     General: There  is no distension.     Tenderness: There is no abdominal tenderness. There is no guarding or rebound.  Skin:    General: Skin is warm and dry.  Neurological:     Mental Status: He is alert.     Motor: Atrophy present. No tremor.     Comments: Face is symmetric, speech is brief, but clear, patient moves all extremities spontaneously to command.  Psychiatric:        Cognition and Memory: Cognition is impaired. Memory is impaired.    ED Results / Procedures / Treatments   Labs (all labs ordered are listed, but only abnormal results are displayed) Labs Reviewed  COMPREHENSIVE METABOLIC PANEL - Abnormal; Notable for the following components:      Result Value   CO2 20 (*)    Glucose, Bld 119 (*)    BUN 24 (*)    Calcium 8.5 (*)    Albumin 2.7 (*)    Alkaline Phosphatase 132 (*)    All other components within normal limits  CBC WITH DIFFERENTIAL/PLATELET - Abnormal; Notable for the following components:   RBC 3.22 (*)    Hemoglobin 9.4 (*)    HCT 29.1 (*)    RDW 16.3 (*)    All other components within normal limits  PROTIME-INR  MAGNESIUM  TYPE AND SCREEN    EKG EKG Interpretation  Date/Time:  Tuesday Sep 15 2021 12:56:40 EDT Ventricular Rate:  31 PR Interval:  334 QRS Duration: 117 QT Interval:  560 QTC Calculation: 403 R Axis:   -57 Text Interpretation: Sinus bradycardia Second deg AVB, Mobitz I (Wenckebach) Left anterior fascicular block Nonspecific T abnormalities, inferior leads Abnormal ECG Confirmed by Carmin Muskrat [id number]) on 09/15/2021 1:30:43 PM  Radiology No results found.  Procedures Procedures    Medications Ordered in ED Medications  sodium chloride 0.9 % bolus 500 mL (0 mLs Intravenous Stopped 09/15/21 1302)    ED Course/ Medical Decision Making/ A&P   This patient with a Hx of stroke, heart disease, cognitive decline presents to the ED for concern of loose stool, possible GI bleed, this involves an extensive number of treatment options, and is a  complaint that carries with it a high risk of complications and morbidity.    The differential diagnosis includes diverticulosis, medication related bleeding, diarrheal illness, sepsis   Social Determinants of Health:  Age, obesity, multiple medical comorbidities  Additional history obtained:  Additional history and/or information obtained from EMS, notable for details of HPI as above   After the initial evaluation, orders, including: Labs continuous monitoring, type and screen, fluids were initiated.   Patient placed on Cardiac and Pulse-Oximetry Monitors. The patient was maintained on a cardiac monitor.  The cardiac monitored showed an rhythm of 60 sinus normal with conduction block The patient was  also maintained on pulse oximetry. The readings were typically 100 room air normal   On repeat evaluation of the patient stayed the same clinically, but whereas his initial heart rhythm in the 60s, with tach to 1 degree block, roughly 3-1, he proceeded to have decrease in heart rate, and was persistently bradycardic, heart rate in the 30s.  Lab Tests:  I personally interpreted labs.  The pertinent results include: Hemoglobin essentially the same as baseline   Consultations Obtained:  I requested consultation with the cardiology,  and discussed lab and imaging findings as well as pertinent plan - they recommend: Cessation of beta-blocker, monitoring.  Dispostion / Final MDM:  After consideration of the diagnostic results and the patient's response to treatment, patient will be admitted.  This adult male with history of stroke, multiple other medical issues presents from nursing facility after staff had concern about weakness, possible loose stool.  Patient is awake, alert, has substantial atrophy, but no evidence for new neurologic dysfunction.  He is not hypotensive or febrile, there is low suspicion for infection, with a soft nonperitoneal abdomen as well.  Patient's bowel movement  here was soft, but not diarrheal, with no blood.  Patient's labs generally unremarkable.  However, the patient was found to have persistent bradycardia, with type II block.  This was discussed with our cardiology colleagues who can follow as a consulting service, and recommend cessation of beta-blocker therapy during admission.    Final Clinical Impression(s) / ED Diagnoses Final diagnoses:  Bradycardia  Weakness     Carmin Muskrat, MD 09/15/21 1434

## 2021-09-15 NOTE — H&P (Signed)
History and Physical    Manuel Chapman [id number] DOB: 05-17-1945 DOA: 09/15/2021  PCP: Lorene Dy, MD (Confirm with patient/family/NH records and if not entered, this has to be entered at [facility] point of entry) Patient coming from: Rehab center  I have personally briefly reviewed patient's old medical records in [place]  Chief Complaint: Feeling weak and tired  HPI: Manuel Chapman is a 76 y.o. male with medical history significant of HTN, IDDM, chronic systolic CHF, [facility] August [id number], recent cerebellar infarct, sent from rehab center for evaluation of worsening of generalized weakness and lethargy.  Patient has had loose bowel movement for last 3 days but no abdominal pain.  Patient denied any chest pain shortness of breath abdominal pain nauseous vomiting or diarrhea.  But does complain about " low energy" and feeling sleepy all the time.  ED Course: Patient was found to be very bradycardia heart rate in the 40s, blood pressure maintains no hypoxia.  Cardiology aware.  Review of Systems: As per HPI otherwise 14 point review of systems negative.    Past Medical History:  Diagnosis Date   CVA (cerebral vascular accident) ([place]) 2020   Diabetes mellitus    Hypertension    ICH (intracerebral hemorrhage) ([place]) 11/2019    No past surgical history on file.   reports that he quit smoking about 25 years ago. His smoking use included cigarettes. He has a 10.00 pack-year smoking history. He has never used smokeless tobacco. He reports current alcohol use. He reports that he does not use drugs.  No Known Allergies  Family History  Problem Relation Age of Onset   Diabetes Neg Hx    Stroke Neg Hx      Prior to Admission medications   Medication Sig Start Date End Date Taking? Authorizing Provider  acetaminophen (TYLENOL) 325 MG tablet Take 2 tablets (650 mg total) by mouth every 4 (four) hours as needed for mild pain (or temp > 37.5 C (99.5 F)). 05/09/21  Yes  Elgergawy, Silver Huguenin, MD  allopurinol (ZYLOPRIM) 300 MG tablet Take 300 mg by mouth daily.   Yes [provider]  atorvastatin (LIPITOR) 80 MG tablet Take 1 tablet (80 mg total) by mouth daily. 06/22/21  Yes Angiulli, Lavon Paganini, PA-C  chlorthalidone (HYGROTON) 25 MG tablet Take 50 mg by mouth daily. 12/24/20  Yes [provider]  cloNIDine (CATAPRES) 0.2 MG tablet Take 1 tablet (0.2 mg total) by mouth 3 (three) times daily. 06/22/21  Yes Angiulli, Lavon Paganini, PA-C  clopidogrel (PLAVIX) 75 MG tablet Take 1 tablet (75 mg total) by mouth daily. 05/10/21  Yes Elgergawy, Silver Huguenin, MD  hydrALAZINE (APRESOLINE) 100 MG tablet Take 1 tablet (100 mg total) by mouth 4 (four) times daily. 07/01/21  Yes Angiulli, Lavon Paganini, PA-C  insulin detemir (LEVEMIR) 100 UNIT/ML injection Inject 0.12 mLs (12 Units total) into the skin daily. Patient taking differently: Inject 10-22 Units into the skin See admin instructions. Inject 22 units subcutaneous at 0800, then inject 10 units subcutaneous at 2000 per [facility] 07/02/21  Yes Angiulli, Lavon Paganini, PA-C  lisinopril (ZESTRIL) 40 MG tablet Take 1 tablet (40 mg total) by mouth daily. 12/05/19  Yes [name], NP  Menthol, Topical Analgesic, (BIOFREEZE) 4 % GEL Apply 1 application. topically in the morning, at noon, and at bedtime.   Yes [provider]  metoprolol succinate (TOPROL-XL) 25 MG 24 hr tablet Take 1 tablet (25 mg total) by mouth daily. 06/22/21  Yes [place],  Lavon Paganini, PA-C  Multiple Vitamin (MULTIVITAMIN WITH MINERALS) TABS tablet Take 1 tablet by mouth daily. 05/10/21  Yes Elgergawy, Silver Huguenin, MD  ondansetron (ZOFRAN) 4 MG tablet Take 4 mg by mouth every 8 (eight) hours as needed. 08/10/21  Yes [provider]  insulin detemir (LEVEMIR) 100 UNIT/ML injection Inject 0.1 mLs (10 Units total) into the skin daily. Patient not taking: Reported on 09/15/2021 06/22/21   Cathlyn Parsons, PA-C    Physical Exam: Vitals:   09/15/21 1500 09/15/21  1515 09/15/21 1630 09/15/21 1700  BP: (!) 104/40 99/62 (!) 133/58 137/65  Pulse: (!) 32 (!) 34 (!) 26 (!) 149  Resp: 13 17 16 14   Temp:      TempSrc:      SpO2: 100% 100% 100% 98%    Constitutional: NAD, calm, comfortable Vitals:   09/15/21 1500 09/15/21 1515 09/15/21 1630 09/15/21 1700  BP: (!) 104/40 99/62 (!) 133/58 137/65  Pulse: (!) 32 (!) 34 (!) 26 (!) 149  Resp: 13 17 16 14   Temp:      TempSrc:      SpO2: 100% 100% 100% 98%   Eyes: PERRL, lids and conjunctivae normal ENMT: Mucous membranes are dry. Posterior pharynx clear of any exudate or lesions.Normal dentition.  Neck: normal, supple, no masses, no thyromegaly Respiratory: clear to auscultation bilaterally, no wheezing, no crackles. Normal respiratory effort. No accessory muscle use.  Cardiovascular: Regular rate and rhythm, loud systolic murmur on heart base.  No extremity edema. 2+ pedal pulses. No carotid bruits.  Abdomen: no tenderness, no masses palpated. No hepatosplenomegaly. Bowel sounds positive.  Musculoskeletal: no clubbing / cyanosis. No joint deformity upper and lower extremities. Good ROM, no contractures. Normal muscle tone.  Skin: no rashes, lesions, ulcers. No induration Neurologic: CN 2-12 grossly intact. Sensation intact, DTR normal. Strength 5/5 in all 4.  Psychiatric: Normal judgment and insight. Alert and oriented x 3. Normal mood.     Labs on Admission: I have personally reviewed following labs and imaging studies  CBC: Recent Labs  Lab 09/15/21 1204  WBC 7.1  NEUTROABS 5.1  HGB 9.4*  HCT 29.1*  MCV 90.4  PLT 123456   Basic Metabolic Panel: Recent Labs  Lab 09/15/21 1204  NA 135  K 3.7  CL 108  CO2 20*  GLUCOSE 119*  BUN 24*  CREATININE 1.16  CALCIUM 8.5*  MG 2.0   GFR: Estimated Creatinine Clearance: 57.1 mL/min (by C-G formula based on SCr of 1.16 mg/dL). Liver Function Tests: Recent Labs  Lab 09/15/21 1204  AST 30  ALT 24  ALKPHOS 132*  BILITOT 0.7  PROT 6.8   ALBUMIN 2.7*   No results for input(s): LIPASE, AMYLASE in the last 168 hours. No results for input(s): AMMONIA in the last 168 hours. Coagulation Profile: Recent Labs  Lab 09/15/21 1204  INR 0.9   Cardiac Enzymes: No results for input(s): CKTOTAL, CKMB, CKMBINDEX, TROPONINI in the last 168 hours. BNP (last 3 results) No results for input(s): PROBNP in the last 8760 hours. HbA1C: No results for input(s): HGBA1C in the last 72 hours. CBG: No results for input(s): GLUCAP in the last 168 hours. Lipid Profile: No results for input(s): CHOL, HDL, LDLCALC, TRIG, CHOLHDL, LDLDIRECT in the last 72 hours. Thyroid Function Tests: No results for input(s): TSH, T4TOTAL, FREET4, T3FREE, THYROIDAB in the last 72 hours. Anemia Panel: No results for input(s): VITAMINB12, FOLATE, FERRITIN, TIBC, IRON, RETICCTPCT in the last 72 hours. Urine analysis:    Component  Value Date/Time   COLORURINE YELLOW 05/08/2021 [place] 05/08/2021 1633   LABSPEC 1.015 05/08/2021 1633   PHURINE 7.0 05/08/2021 1633   GLUCOSEU NEGATIVE 05/08/2021 1633   HGBUR TRACE (A) 05/08/2021 1633   BILIRUBINUR NEGATIVE 05/08/2021 1633   KETONESUR NEGATIVE 05/08/2021 1633   PROTEINUR 100 (A) 05/08/2021 1633   UROBILINOGEN 0.2 05/03/2011 1754   NITRITE NEGATIVE 05/08/2021 1633   LEUKOCYTESUR TRACE (A) 05/08/2021 1633    Radiological Exams on Admission: No results found.  EKG: Independently reviewed.  Morbid type 1 AV block  Assessment/Plan Principal Problem:   Bradycardia Active Problems:   Second degree AV block  (please populate well all problems here in Problem List. (For example, if patient is on BP meds at home and you resume or decide to hold them, it is a problem that needs to be her. Same for CAD, COPD, HLD and so on)   Symptomatic bradycardia -Likely secondary to AV blocking agent, discontinue clonidine and metoprolol as per recommended by cardiology. -Check TSH -As of now, heart rate  maintained above 35, ordered as needed atropine for heart rate<35.  Admitted to PCU for close monitoring, may consider temporary pacing, defer to cardiology. -1 dose potassium to make K> 4.0, magnesium level normal -Albuterol treatment x1 -Will check Trop level  History of chronic HFrEF -Clinically appears to be volume contracted probably secondary to GI loss from diarrhea -Patient received a total of 500 mL IV bolus in the ED, and will continue normal saline at 100 mm/h x 12 hours with caution  Chronic normocytic anemia -H&H slightly decreased compared to baseline, check iron study and FOBT.  History of severe AS -Given patient also has a history of systolic heart failure, there is indication for TAVR.  Will discuss with cardiology.  HTN -Blood pressure borderline low, hold off or BP meds.  IDDM -Sliding scale for now  DVT prophylaxis: Lovenox Code Status: Full code Family Communication: Daughter Santiago Glad (Other daughters not picking up, spouse not picking up) Disposition Plan: Patient sick with significant bradycardia may need PPM evaluation Consults called: Cardiology Admission status: PCU   Lequita Halt MD [facility] Pager [phone]  09/15/2021, 5:33 PM

## 2021-09-16 ENCOUNTER — Encounter (HOSPITAL_COMMUNITY): Payer: Self-pay | Admitting: Internal Medicine

## 2021-09-16 ENCOUNTER — Other Ambulatory Visit: Payer: Self-pay | Admitting: Physician Assistant

## 2021-09-16 ENCOUNTER — Inpatient Hospital Stay (HOSPITAL_COMMUNITY): Payer: Medicare HMO

## 2021-09-16 DIAGNOSIS — I350 Nonrheumatic aortic (valve) stenosis: Secondary | ICD-10-CM

## 2021-09-16 DIAGNOSIS — R001 Bradycardia, unspecified: Secondary | ICD-10-CM | POA: Diagnosis not present

## 2021-09-16 DIAGNOSIS — I69319 Unspecified symptoms and signs involving cognitive functions following cerebral infarction: Secondary | ICD-10-CM

## 2021-09-16 DIAGNOSIS — D509 Iron deficiency anemia, unspecified: Secondary | ICD-10-CM

## 2021-09-16 DIAGNOSIS — R011 Cardiac murmur, unspecified: Secondary | ICD-10-CM

## 2021-09-16 DIAGNOSIS — R748 Abnormal levels of other serum enzymes: Secondary | ICD-10-CM

## 2021-09-16 DIAGNOSIS — E872 Acidosis, unspecified: Secondary | ICD-10-CM

## 2021-09-16 DIAGNOSIS — D500 Iron deficiency anemia secondary to blood loss (chronic): Secondary | ICD-10-CM

## 2021-09-16 HISTORY — DX: Iron deficiency anemia, unspecified: D50.9

## 2021-09-16 HISTORY — DX: Nonrheumatic aortic (valve) stenosis: I35.0

## 2021-09-16 HISTORY — DX: Acidosis, unspecified: E87.20

## 2021-09-16 HISTORY — DX: Unspecified symptoms and signs involving cognitive functions following cerebral infarction: I69.319

## 2021-09-16 LAB — ECHOCARDIOGRAM COMPLETE
AR max vel: 1.22 cm2
AV Area VTI: 1.31 cm2
AV Area mean vel: 1.22 cm2
AV Mean grad: 39 mmHg
AV Peak grad: 47.4 mmHg
Ao pk vel: 3.44 m/s
Area-P 1/2: 4.6 cm2
Calc EF: 61 %
Height: 66 in
MV VTI: 2.1 cm2
S' Lateral: 4.1 cm
Single Plane A2C EF: 70.5 %
Single Plane A4C EF: 45.7 %
Weight: 3396.85 oz

## 2021-09-16 LAB — URINALYSIS, ROUTINE W REFLEX MICROSCOPIC
Bacteria, UA: NONE SEEN
Bilirubin Urine: NEGATIVE
Glucose, UA: NEGATIVE mg/dL
Hgb urine dipstick: NEGATIVE
Ketones, ur: NEGATIVE mg/dL
Leukocytes,Ua: NEGATIVE
Nitrite: NEGATIVE
Protein, ur: 100 mg/dL — AB
Specific Gravity, Urine: 1.011 (ref 1.005–1.030)
pH: 5 (ref 5.0–8.0)

## 2021-09-16 LAB — GLUCOSE, CAPILLARY
Glucose-Capillary: 101 mg/dL — ABNORMAL HIGH (ref 70–99)
Glucose-Capillary: 73 mg/dL (ref 70–99)
Glucose-Capillary: 103 mg/dL — ABNORMAL HIGH (ref 70–99)
Glucose-Capillary: 146 mg/dL — ABNORMAL HIGH (ref 70–99)
Glucose-Capillary: 143 mg/dL — ABNORMAL HIGH (ref 70–99)

## 2021-09-16 LAB — TYPE AND SCREEN
ABO/RH(D): O POS
Antibody Screen: NEGATIVE

## 2021-09-16 MED ORDER — POLYETHYLENE GLYCOL 3350 17 G PO PACK
17.0000 g | PACK | Freq: Every day | ORAL | Status: DC
  Administered 2021-09-18: 17 g via ORAL
  Filled 2021-09-16: qty 1

## 2021-09-16 MED ORDER — LISINOPRIL 20 MG PO TABS
20.0000 mg | ORAL_TABLET | Freq: Every day | ORAL | Status: DC
  Administered 2021-09-16: 20 mg via ORAL
  Filled 2021-09-16: qty 1

## 2021-09-16 MED ORDER — MUSCLE RUB 10-15 % EX CREA
TOPICAL_CREAM | Freq: Two times a day (BID) | CUTANEOUS | Status: DC
  Administered 2021-09-16 – 2021-09-17 (×2): 1 via TOPICAL
  Filled 2021-09-16: qty 85

## 2021-09-16 MED ORDER — LISINOPRIL 20 MG PO TABS
40.0000 mg | ORAL_TABLET | Freq: Every day | ORAL | Status: DC
  Administered 2021-09-17 – 2021-09-18 (×2): 40 mg via ORAL
  Filled 2021-09-16 (×2): qty 2

## 2021-09-16 MED ORDER — HYDRALAZINE HCL 50 MG PO TABS
100.0000 mg | ORAL_TABLET | Freq: Three times a day (TID) | ORAL | Status: DC
  Filled 2021-09-16: qty 2

## 2021-09-16 MED ORDER — HYDROCHLOROTHIAZIDE 25 MG PO TABS
25.0000 mg | ORAL_TABLET | Freq: Every day | ORAL | Status: DC
  Administered 2021-09-16 – 2021-09-18 (×3): 25 mg via ORAL
  Filled 2021-09-16 (×3): qty 1

## 2021-09-16 MED ORDER — CLONIDINE HCL 0.1 MG PO TABS
0.1000 mg | ORAL_TABLET | Freq: Two times a day (BID) | ORAL | Status: DC
  Administered 2021-09-16 – 2021-09-17 (×2): 0.1 mg via ORAL
  Filled 2021-09-16 (×2): qty 1

## 2021-09-16 MED ORDER — HYDRALAZINE HCL 25 MG PO TABS
25.0000 mg | ORAL_TABLET | Freq: Three times a day (TID) | ORAL | Status: DC
  Administered 2021-09-16: 25 mg via ORAL
  Filled 2021-09-16: qty 1

## 2021-09-16 MED ORDER — SODIUM CHLORIDE 0.9 % IV SOLN: INTRAVENOUS | Status: DC

## 2021-09-16 MED ORDER — HYDRALAZINE HCL 20 MG/ML IJ SOLN
5.0000 mg | Freq: Once | INTRAMUSCULAR | Status: AC
  Administered 2021-09-16: 5 mg via INTRAVENOUS
  Filled 2021-09-16: qty 1

## 2021-09-16 MED ORDER — FERROUS SULFATE 325 (65 FE) MG PO TABS
325.0000 mg | ORAL_TABLET | Freq: Two times a day (BID) | ORAL | Status: DC
  Administered 2021-09-16 – 2021-09-18 (×4): 325 mg via ORAL
  Filled 2021-09-16 (×4): qty 1

## 2021-09-16 MED ORDER — HYDRALAZINE HCL 20 MG/ML IJ SOLN: 2.0000 mg | Freq: Once | INTRAMUSCULAR | Status: DC

## 2021-09-16 MED ORDER — MENTHOL (TOPICAL ANALGESIC) 4 % EX GEL: 1.0000 "application " | Freq: Two times a day (BID) | CUTANEOUS | Status: DC

## 2021-09-16 MED ORDER — HYDRALAZINE HCL 50 MG PO TABS
100.0000 mg | ORAL_TABLET | Freq: Three times a day (TID) | ORAL | Status: DC
  Administered 2021-09-16 – 2021-09-17 (×3): 100 mg via ORAL
  Filled 2021-09-16 (×2): qty 2

## 2021-09-16 NOTE — Consult Note (Cosign Needed)
[place] VALVE TEAM  Cardiology Consultation:   Chapman ID: Manuel Chapman MRN: [id number]; DOB: Jun 11, 1945  Admit date: 09/15/2021 Date of Consult: 09/16/2021  Primary Care Provider: Lorene Dy, MD [facility] Cardiologist: None   Chapman Profile:   Manuel Chapman is a 76 y.o. 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 Verlon Au.  History of Present Illness:   Manuel Chapman currently lives at an assisted living facility after presenting to [facility] 05/05/2021 with dizziness and vomiting x 3 days. CT/MRI showed large region of acute early subacute infarct affecting Manuel inferior cerebellum on Manuel right consistent with right PICA territory infarction. 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 CT angiogram head and neck moderate narrowing of Manuel intracranial right internal carotid artery with no large vessel occlusion or stenosis. He was followed closely by neurology and was ultimately admitted to inpatient rehab from 05/09/21-07/01/21 at which time he was discharged to [facility] due to his deficits. During his hospital course he underwent an echocardiogram that showed an LVEF at 45-50% with WMA, G2DD with severe calcifications of Manuel AV, mean gradient at 30.13mmHg, peak at 65.47mmHg and an AVA by VTI at 0.58cm2. 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 After his most  recent stroke, he was maintained on DAPT with ASA and Plavix for 3 weeks then transitioned to Plavix monotherapy. At that time, he declined loop recorder implantation and opted for 30 day OP cardiac monitor after rehab discharge. He saw OP neurology for follow up 08/31/21 at which time Manuel long term monitor was ordered however was never placed. He states this is waiting for him at [facility].   He then presented to [facility] 09/15/21 with possible GI bleeding, worsening weakness and lethargy. On presentation, Hb at 9.4 with a baseline around 11.0. Cr at 1.21, HsT at 24>>69. Albumin at 2.7. CXR with no overt pulmonary edema or focal airspace disease. EKG with HR 57bpm and Mobitz Type II/II. Per chart review, telemetry reported with HR's in Manuel 30's. Cardiology was consulted for recommendations and AV nodal blocking agents were discontinued. His HR has since improved to Manuel 80's on my exam.   He is divorced and has two daughters who live close by and are involved with his care. He is alert and oriented and is able to do most ADLs. He reports that he is relatively active at his facility, as he continues to participate with PT/OT and speech therapy. He walks with a walker. He denies symptoms such as exertional SOB, orthopnea, LE edema, chest pain, dizziness. He does report some fatigue however is unclear if this is from his stroke. He wears dentures.   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 No pertinent surgical history.   Home Medications:  Prior to Admission medications   Medication Sig Start Date End Date Taking? Authorizing Provider  acetaminophen (TYLENOL) 325  MG tablet Take 2 tablets (650 mg total) by mouth every 4 (four) hours as needed for mild pain (or temp > 37.5 C (99.5 F)). 05/09/21  Yes Elgergawy, Silver Huguenin, MD  allopurinol (ZYLOPRIM) 300 MG tablet Take 300 mg by mouth daily.   Yes [provider]  atorvastatin (LIPITOR) 80 MG tablet Take 1 tablet (80 mg total) by mouth daily. 06/22/21  Yes Angiulli, Lavon Paganini, PA-C  chlorthalidone (HYGROTON) 25 MG tablet Take 50 mg by mouth daily. 12/24/20  Yes [provider]  cloNIDine (CATAPRES) 0.2 MG tablet Take 1 tablet (0.2 mg total) by mouth 3 (three) times daily. 06/22/21  Yes Angiulli, Lavon Paganini, PA-C  clopidogrel (PLAVIX) 75 MG tablet Take 1 tablet (75 mg total) by mouth daily. 05/10/21  Yes Elgergawy, Silver Huguenin, MD  hydrALAZINE (APRESOLINE) 100 MG tablet Take 1 tablet (100 mg total) by mouth 4 (four) times daily. 07/01/21  Yes Angiulli, Lavon Paganini, PA-C  insulin detemir (LEVEMIR) 100 UNIT/ML injection Inject 0.12 mLs (12 Units total) into Manuel skin daily. Chapman taking differently: Inject 10-22 Units into Manuel skin See admin instructions. Inject 22 units subcutaneous at 0800, then inject 10 units subcutaneous at 2000 per [facility] 07/02/21  Yes Angiulli, Lavon Paganini, PA-C  lisinopril (ZESTRIL) 40 MG tablet Take 1 tablet (40 mg total) by mouth daily. 12/05/19  Yes [name], NP  Menthol, Topical Analgesic, (BIOFREEZE) 4 % GEL Apply 1 application. topically in Manuel morning, at noon, and at bedtime.   Yes [provider]  metoprolol succinate (TOPROL-XL) 25 MG 24 hr tablet Take 1 tablet (25 mg total) by mouth daily. 06/22/21  Yes Angiulli, Lavon Paganini, PA-C  Multiple Vitamin (MULTIVITAMIN WITH MINERALS) TABS tablet Take 1 tablet by mouth daily. 05/10/21  Yes Elgergawy, Silver Huguenin, MD  ondansetron (ZOFRAN) 4 MG tablet Take 4 mg by mouth every 8 (eight) hours as needed. 08/10/21  Yes [provider]  insulin detemir (LEVEMIR) 100 UNIT/ML injection Inject 0.1 mLs (10 Units total) into Manuel skin  daily. Chapman not taking: Reported on 09/15/2021 06/22/21   Cathlyn Parsons, PA-C    Inpatient Medications: Scheduled Meds:  allopurinol  300 mg Oral Daily   atorvastatin  80 mg Oral QHS   clopidogrel  75 mg Oral Daily   enoxaparin (LOVENOX) injection  40 mg Subcutaneous Q24H   ferrous sulfate  325 mg Oral BID WC   hydrALAZINE  25 mg Oral Q8H   insulin aspart  0-15 Units Subcutaneous TID WC   insulin detemir  12 Units Subcutaneous Daily   lisinopril  20 mg Oral Daily   polyethylene glycol  17 g Oral Daily   sodium chloride flush  3 mL Intravenous Q12H   Continuous Infusions:  sodium chloride     sodium chloride 100 mL/hr at 09/16/21 0848   PRN Meds: sodium chloride, acetaminophen, acetaminophen, atropine, hydrALAZINE, ondansetron (ZOFRAN) IV, ondansetron, sodium chloride flush  Allergies:   No Known Allergies  Social History:   Social History   Socioeconomic History   Marital status: Single    Spouse name: Not on file   Number of children: Not on file   Years of education: Not on file   Highest education  level: Not on file  Occupational History   Occupation: retired  Tobacco Use   Smoking status: Former    Packs/day: 1.00    Years: 10.00    Pack years: 10.00    Types: Cigarettes    Quit date: 1998    Years since quitting: 25.4   Smokeless tobacco: Never  Substance and Sexual Activity   Alcohol use: Yes    Comment: not much, h/o heavy beer use   Drug use: No   Sexual activity: Not on file  Other Topics Concern   Not on file  Social History Narrative   Not on file   Social Determinants of Health   Financial Resource Strain: Not on file  Food Insecurity: Not on file  Transportation Needs: Not on file  Physical Activity: Not on file  Stress: Not on file  Social Connections: Not on file  Intimate Partner Violence: Not on file    Family History:   Family History  Problem Relation Age of Onset   Diabetes Neg Hx    Stroke Neg Hx     ROS:  Please see  Manuel history of present illness.   All other ROS reviewed and negative.     Physical Exam/Data:   Vitals:   09/16/21 0321 09/16/21 0400 09/16/21 0736 09/16/21 1104  BP: (!) 174/72  (!) 182/66 (!) 200/85  Pulse:  (!) 55 60 70  Resp:  13 14 13   Temp:   97.8 F (36.6 C) 97.8 F (36.6 C)  TempSrc:   Oral Oral  SpO2:  98% 98% 97%  Weight:      Height:        Intake/Output Summary (Last 24 hours) at 09/16/2021 1226 Last data filed at 09/16/2021 [id number] Gross per 24 hour  Intake 1723.02 ml  Output 800 ml  Net 923.02 ml      09/16/2021    3:11 AM 09/15/2021    6:51 PM 09/09/2021   10:44 AM  Last 3 Weights  Weight (lbs) 212 lb 4.9 oz 212 lb 11.9 oz 212 lb 12.8 oz  Weight (kg) 96.3 kg 96.5 kg 96.525 kg     Body mass index is 34.27 kg/m.   General: Well developed, well nourished, NAD Lungs:Clear to ausculation bilaterally. No wheezes. Breathing is unlabored. Cardiovascular: RRR with S1 S2. Harsh, loud systolic murmur Abdomen: Soft, non-tender, non-distended. No obvious abdominal masses. Extremities: No edema. Neuro: Alert and oriented.  Psych: Responds to questions appropriately with normal affect.    EKG:  Manuel EKG was personally reviewed and demonstrates: 09/16/21 NSR with 1st degree AV block with HR 81bpm  Telemetry:  Telemetry was personally reviewed and demonstrates: NSR with 1st degree AV block   Relevant CV Studies:  Echocardiogram 05/05/2021:   1. Left ventricular ejection fraction, by estimation, is 45 to 50%. Manuel  left ventricle has mildly decreased function. Manuel left ventricle  demonstrates regional wall motion abnormalities (see scoring  diagram/findings for description). There is moderate  concentric left ventricular hypertrophy. Left ventricular diastolic  parameters are consistent with Grade II diastolic dysfunction  (pseudonormalization). Elevated left atrial pressure. There is akinesis of  Manuel left ventricular, basal-mid inferolateral  wall.   2. Right  ventricular systolic function is mildly reduced. Manuel right  ventricular size is mildly enlarged. Tricuspid regurgitation signal is  inadequate for assessing PA pressure.   3. Left atrial size was mildly dilated.   4. Right atrial size was mildly dilated.   5. Manuel mitral valve is normal  in structure. Mild mitral valve  regurgitation.   6. Manuel aortic valve is tricuspid. There is severe calcifcation of Manuel  aortic valve. There is severe thickening of Manuel aortic valve. Aortic valve  regurgitation is trivial. Severe aortic valve stenosis. Aortic valve mean  gradient measures 30.9 mmHg.  Aortic valve Vmax measures 4.04 m/s.   Comparison(s): Prior images reviewed side by side. Manuel left ventricular  function is worsened. Manuel right ventricular systolic function is worse.  Aortic stenosis has worsened and is now severe.   Echocardiogram 11/29/2019:   1. Hypokinesis of Manuel basal inferolateral wall; overall normal LV  function; grade 1 diastolic dysfunction; moderate AS (mean gradient 24  mmHg); mild LAE.   2. Left ventricular ejection fraction, by estimation, is 55 to 60%. Manuel  left ventricle has normal function. Manuel left ventricle demonstrates  regional wall motion abnormalities (see scoring diagram/findings for  description). Left ventricular diastolic  parameters are consistent with Grade I diastolic dysfunction (impaired  relaxation).   3. Right ventricular systolic function is normal. Manuel right ventricular  size is normal.   4. Left atrial size was mildly dilated.   5. Manuel mitral valve is normal in structure. Trivial mitral valve  regurgitation. No evidence of mitral stenosis.   6. Manuel aortic valve has an indeterminant number of cusps. Aortic valve  regurgitation is not visualized. Moderate aortic valve stenosis.   Laboratory Data:  High Sensitivity Troponin:   Recent Labs  Lab 09/15/21 1204 09/15/21 2221  TROPONINIHS 24* 69*     Chemistry Recent Labs  Lab 09/15/21 1204  09/15/21 2221  NA 135 136  K 3.7 4.2  CL 108 112*  CO2 20* 20*  GLUCOSE 119* 113*  BUN 24* 24*  CREATININE 1.16 1.21  CALCIUM 8.5* 7.8*  GFRNONAA >60 >60  ANIONGAP 7 4*    Recent Labs  Lab 09/15/21 1204  PROT 6.8  ALBUMIN 2.7*  AST 30  ALT 24  ALKPHOS 132*  BILITOT 0.7   Hematology Recent Labs  Lab 09/15/21 1204  WBC 7.1  RBC 3.22*  HGB 9.4*  HCT 29.1*  MCV 90.4  MCH 29.2  MCHC 32.3  RDW 16.3*  PLT 266   BNPNo results for input(s): BNP, PROBNP in Manuel last 168 hours.  DDimer No results for input(s): DDIMER in Manuel last 168 hours.   Radiology/Studies:  DG Chest 1 View  Result Date: 09/15/2021 CLINICAL DATA:  CHF, bradycardia, weakness and diarrhea. EXAM: CHEST  1 VIEW COMPARISON:  Chest radiograph May 05, 2021 FINDINGS: Cardiomegaly and central vascular prominence. Aortic atherosclerosis. No overt pulmonary edema or focal airspace consolidation. No visible pleural effusion or pneumothorax. No acute osseous abnormality. IMPRESSION: Cardiomegaly and central vascular prominence. No overt pulmonary edema or focal airspace consolidation. Electronically Signed   By: Dahlia Bailiff M.D.   On: 09/15/2021 18:19     STS Risk Calculator: Procedure: AV Replacement   Risk of Mortality: 2.427% Renal Failure: 8.820% Permanent Stroke: 1.955% Prolonged Ventilation: 12.005% DSW Infection: 0.286% Reoperation: 3.715% Morbidity or Mortality: 18.385% Short Length of Stay: 18.033% Long Length of Stay: 10.931%  [facility] Cardiomyopathy Questionnaire     09/16/2021    2:07 PM  KCCQ-12  1 a. Ability to shower/bathe Moderately limited  1 b. Ability to walk 1 block Moderately limited  1 c. Ability to hurry/jog Other, Did not do  2. Edema feet/ankles/legs Less than once a week  3. Limited by fatigue 3+ times per week, not every day  4.  Limited by dyspnea Never over Manuel past 2 weeks  5. Sitting up / on 3+ pillows Never over Manuel past 2 weeks  6. Limited enjoyment of  life Moderately limited  7. Rest of life w/ symptoms Mostly satisfied  8 a. Participation in hobbies Moderately limited  8 b. Participation in chores N/A, did not do for other reasons  8 c. Visiting family/friends N/A, did not do for other reasons    Assessment and Plan:   Manuel Chapman is a 75 y.o. male with symptoms of severe, stage D1/D2 aortic stenosis with NYHA Class II symptoms. I have reviewed Manuel Chapman's recent echocardiogram which is notable for low normal LV systolic function at [id number] and severe aortic stenosis with peak gradient of 65.7mmHg and mean transvalvular gradient of 30.20mmHg. Manuel Chapman's dimensionless index is 0.21 and calculated aortic valve area is 0.58cm per echocardiogram dated 05/05/2021. Echocardiogram this admission is pending at this time.    I have reviewed Manuel natural history of aortic stenosis with Manuel Chapman. We have discussed Manuel limitations of medical therapy and Manuel poor prognosis associated with symptomatic aortic stenosis. We have reviewed potential treatment options, including palliative medical therapy, conventional surgical aortic valve replacement, and transcatheter aortic valve replacement. We discussed treatment options in Manuel context of this Chapman's specific comorbid medical conditions.    Manuel Chapman's predicted risk of mortality with conventional aortic valve replacement is 2.4% primarily based on multiple prior strokes with residual, insulin dependent DM2, and relatively poor functional quality. Other significant comorbid conditions include uncontrolled HTN and recent 2nd degree AV block on telemetry. Unclear if TAVR would be a reasonable treatment option for this Chapman given that he is relatively asymptomatic with multiple physical deficits from CVAs. He underwent a repeat echocardiogram this admission which is pending formal read. He would also require a gated cardiac CTA and a CTA of Manuel chest/abdomen/pelvis and R/LHC to evaluate both  his cardiac anatomy and peripheral vasculature prior to moving forward. Manuel Chapman will additionally require formal consultation from our cardiac surgeon for candidacy. Will discuss his case with MD who will follow with final recommendations.    For questions or updates, please contact [place] Please consult [email or web address] for contact info under    Signed, Kathyrn Drown, NP  09/16/2021 12:26 PM   ATTENDING ATTESTATION:  After conducting a review of all available clinical information with Manuel care team, interviewing Manuel Chapman, and performing a physical exam, I agree with Manuel findings and plan described in this note.   GEN: No acute distress, AO x 2   HEENT:  MMM, no JVD, no scleral icterus Cardiac: RRR, 3/6 SEM.  Respiratory: Clear to auscultation bilaterally. GI: Soft, nontender, non-distended  MS: No edema; No deformity. Neuro:  Nonfocal  Vasc:  +2 radial pulses; +2 femoral pulses  Manuel Chapman is a 76 year old African-American male with a complicated past medical history including subarachnoid hemorrhage in 2021 and most recently a right PCA infarction leading to poor functional capacity and requiring assisted living.  He also has a history of hypertension, insulin-dependent diabetes, and mild systolic dysfunction as well as moderate aortic stenosis on a previous echocardiogram.  He was admitted with failure to thrive and lethargy and was found to be significantly bradycardic.  His beta-blocker was discontinued with resolution of bradycardia.  On my exam today Manuel Chapman is fairly alert and oriented but did not know Manuel year.  He is limited physically.  His physical activity is comprised of transferring from his  bed to his wheelchair.  He tells me he can walk somewhat with a walker but again this is only for transferring.  He denies any shortness of breath with exertion or with rest, presyncope, or exertional angina.  During this admission Manuel Chapman did have a echocardiogram  which I reviewed.  This demonstrated a severely calcified aortic valve with echocardiographic indices consistent with severe aortic stenosis with a preserved ejection fraction and no significant other valvular abnormalities.  At this point in time I do not think Manuel Chapman requires an evaluation for an aortic valve intervention.  I think he is a marginal candidate for intervention.  I think if he became more symptomatic we could consider such but again he has such a poor functional capacity and this would need to be weighed against Manuel risks of Manuel procedure and overall futility.  I think this would require more in-depth discussion with Manuel Chapman in Manuel future.  We will arrange to see him back in our clinic in 3 months time to assess how he is doing.  Thank you for allowing [place] to meet and participate in his care.  Lenna Sciara, MD Pager [phone]

## 2021-09-16 NOTE — Evaluation (Signed)
Physical Therapy Evaluation Patient Details Name: Xavior Lorah MRN: [id number] DOB: May 16, 1945 Today's Date: 09/16/2021  History of Present Illness   Patient is a 76 y/o male who presents on 5/23 with weakness and lethargy with concern for GI bleed from staff at [facility]. Found to have symptomatic bradycardia. 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  Clinical Impression  Patient presents with generalized weakness, impaired balance, decreased endurance and impaired mobility s/p above. Pt is from [facility] and requires assist for ADLs at baseline; uses RW for ambulation and has been working with therapy daily. Today, pt requires Min A for bed mobility, transfers and gait training with use of RW for support. Requires seated rest break due to fatigue/weakness. VSS onR A except elevated BP. Would benefit from return to SNF to maximize independence and mobility in order to decrease fall risk and improve overall strength/function.     Recommendations for follow up therapy are one component of a multi-disciplinary discharge planning process, led by the attending physician.  Recommendations may be updated based on patient status, additional functional criteria and insurance authorization.  Follow Up Recommendations Skilled nursing-short term rehab (<3 hours/day) (with continued PT)    Assistance Recommended at Discharge Frequent or constant Supervision/Assistance  Patient can return home with the following  A little help with bathing/dressing/bathroom;A little help with walking and/or transfers;Assistance with cooking/housework;Help with stairs or ramp for entrance    Equipment Recommendations None recommended by PT  Recommendations for Other Services       Functional Status Assessment Patient has had a recent decline in their functional status and demonstrates the ability to make significant improvements in function in a reasonable and  predictable amount of time.     Precautions / Restrictions Precautions Precautions: Other (comment);Fall Precaution Comments: watch bp. Restrictions Weight Bearing Restrictions: No      Mobility  Bed Mobility                    Transfers                        Ambulation/Gait                  Stairs            Wheelchair Mobility    Modified Rankin (Stroke Patients Only)       Balance                                             Pertinent Vitals/Pain      Home Living Family/patient expects to be discharged to:: Skilled nursing facility                   Additional Comments: [facility] long-term resident.    Prior Function Prior Level of Function : Needs assist       Physical Assist : ADLs (physical);Mobility (physical) Mobility (physical): Gait;Stairs (only 2 stairs into building with rail)   Mobility Comments: pt used RW for mobility with supervision in skilled facility ADLs Comments: skilled facility helps with dressing and supervision when walking with RW. currently working wtih PT/OT.     Hand Dominance   Dominant Hand: Right    Extremity/Trunk Assessment   Upper Extremity Assessment Upper Extremity Assessment: Defer to OT  evaluation    Lower Extremity Assessment Lower Extremity Assessment: RLE deficits/detail;LLE deficits/detail;Generalized weakness RLE Deficits / Details: grossly 4/5 for hip, knee, ankle MMT. WFL for ROM. RLE Sensation: WNL LLE Deficits / Details: grossly 4/5 for hip, knee, ankle MMT. WFL for ROM. LLE Sensation: WNL    Cervical / Trunk Assessment Cervical / Trunk Assessment:  (forward head, rounded shoulders)  Communication   Communication: No difficulties  Cognition                                                General Comments General comments (skin integrity, edema, etc.): Took bp before getting out of bed seated 133/108. Took bp after  getting out of bed seated 191/75.    Exercises     Assessment/Plan    PT Assessment Patient needs continued PT services  PT Problem List Decreased strength;Decreased mobility;Decreased balance;Decreased activity tolerance;Cardiopulmonary status limiting activity       PT Treatment Interventions Therapeutic activities;Gait training;DME instruction;Balance training;Therapeutic exercise;Patient/family education;Functional mobility training    PT Goals (Current goals can be found in the Care Plan section)  Acute Rehab PT Goals Patient Stated Goal: return to PLOF PT Goal Formulation: With patient Time For Goal Achievement: 09/30/21 Potential to Achieve Goals: Good    Frequency Min 3X/week     Co-evaluation               AM-PAC PT "6 Clicks" Mobility  Outcome Measure Help needed turning from your back to your side while in a flat bed without using bedrails?: A Little Help needed moving from lying on your back to sitting on the side of a flat bed without using bedrails?: A Little Help needed moving to and from a bed to a chair (including a wheelchair)?: A Little Help needed standing up from a chair using your arms (e.g., wheelchair or bedside chair)?: A Little Help needed to walk in hospital room?: Total Help needed climbing 3-5 steps with a railing? : Total 6 Click Score: 14    End of Session Equipment Utilized During Treatment: Gait belt Activity Tolerance: Patient tolerated treatment well Patient left: in chair;with call bell/phone within reach;with chair alarm set Nurse Communication: Mobility status PT Visit Diagnosis: Muscle weakness (generalized) (M62.81);Unsteadiness on feet (R26.81);Difficulty in walking, not elsewhere classified (R26.2)    Time: [id number] PT Time Calculation (min) (ACUTE ONLY): 34 min   Charges:   PT Evaluation $PT Eval Moderate Complexity: 1 Mod PT Treatments $Gait Training: 8-22 mins        Zettie Cooley, DPT Acute Rehabilitation  Services Secure chat preferred Office [place] 09/16/2021, 4:31 PM

## 2021-09-16 NOTE — Progress Notes (Signed)
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/02/2021 (discharge to CIR)--- patient declined loop recorder --on 5/8, ?pick up a 30-day event monitor subsequently---- because his blood pressure was uncontrolled--- on that admission he was started and initiated on higher doses of hydralazine HTN IDDM Prior heavy EtOH use, gout, arthritis, HLD  Present from [facility] 5/23 c 3/7 h/o diarrhea-reddish tint to sheet when passing stools While being triaged heart rate dropped from 60 with first-degree block to possible type II Mobitz block in the 97s Cardiology was consulted by EDP who recommended cessation of all beta-blockade No further dark stools noted in the ED  Patient admitted to progressive care for aggressive heart rate monitoring-potassium replaced magnesium checked troponin checked-bolus IV fluid 500 cc given, saline rate 100 cc/H given Noted severe AoS on prior echo    Hospital-Problem based course  Symptomatic bradycardia probably secondary to beta-blocker All beta-blockade has been discontinued and his heart rate seems to be responding well and is now in the 50-60 range Over read of his telemetry shows only first-degree AV block with PVCs We will have to use alternative agents for blood pressure control Unclear if he ever picked up event monitor?-Forward to neurologist and ensure that this is coordinated postdischarge with [facility] Uncontrolled hypertension Previous blood pressure regimen included Toprol-XL 25 clonidine 0.2 3 times daily which may have caused bradycardia Will resume cautiously lisinopril at a lower dose of 20 daily-hold Hygroton on 50 for now Severe aortic stenosis Last echo showed  severe stenosis with a gradient of 30 We will ask not emergently cardiology to provide an opinion on how to manage this in the outpatient setting Mild troponin elevation on admission Has absolutely no chest pain-monitor clinically at this time Subarachnoid hemorrhage 11/2019 + 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 Anemia of iron deficiency with iron level 36 saturations 13 Start p.o. FeSO4 twice daily Gout--quiescent Mildly elevated alk phos-etiology unclear-monitor-outpatient work-up-could have some fatty liver?  DVT prophylaxis: Lovenox Code Status: Full Family Communication:  Disposition:  Status is: Inpatient Remains inpatient appropriate because:   Bradycardic somewhat--- needs further monitoring to ensure does not drop heart rate again-could go back to facility as early as a.m. 5/25 if resolved and several metabolic abnormalities are resolved also   Consultants:    Procedures:   Antimicrobials:     Subjective: Somewhat confused-thinks that he is in a lounge somewhere?  Asking to go back to his room?-He is asking for breakfast in addition He has no chest pain no fever It does not appear that his heart rate has dropped below 50 He is playing with his leads on his monitors and does not seem to be completely oriented to time place person I am not sure how reliable his ROS is  Objective: Vitals:   09/15/21 2300 09/16/21 0311 09/16/21 0321 09/16/21 0400  BP: (!) 138/59 (!) 174/68 (!) 174/72  Pulse: (!) 59 (!) 56  (!) 55  Resp: '13 14   13  ' Temp: 97.8 F (36.6 C) (!) 97.5 F (36.4 C)    TempSrc: Oral Oral    SpO2: 97% 99%  98%  Weight:  96.3 kg    Height:        Intake/Output Summary (Last 24 hours) at 09/16/2021 0647 Last data filed at 09/16/2021 0315 Gross per 24 hour  Intake 898.33 ml  Output 800 ml  Net 98.33 ml   Filed Weights   09/15/21 1851 09/16/21 0311  Weight: 96.5 kg 96.3 kg    Examination:  Pleasant black male no distress EOMI NCAT thick neck Mallampati 4 mild, elevated JVD noted He has a harsh holosystolic murmur in the L USC in addition to the LLSB with radiation to the neck and axilla Abdomen is obese nontender no rebound ROM is intact with no focal deficit Neurologically he is intact  Data Reviewed: personally reviewed   CBC    Component Value Date/Time   WBC 7.1 09/15/2021 1204   RBC 3.22 (L) 09/15/2021 1204   HGB 9.4 (L) 09/15/2021 1204   HGB 14.3 04/24/2008 1531   HCT 29.1 (L) 09/15/2021 1204   HCT 42.9 04/24/2008 1531   PLT 266 09/15/2021 1204   PLT 219 04/24/2008 1531   MCV 90.4 09/15/2021 1204   MCV 93.4 04/24/2008 1531   MCH 29.2 09/15/2021 1204   MCHC 32.3 09/15/2021 1204   RDW 16.3 (H) 09/15/2021 1204   RDW 17.0 (H) 04/24/2008 1531   LYMPHSABS 1.5 09/15/2021 1204   LYMPHSABS 5.6 (H) 04/24/2008 1531   MONOABS 0.4 09/15/2021 1204   MONOABS 0.5 04/24/2008 1531   EOSABS 0.1 09/15/2021 1204   EOSABS 0.0 04/24/2008 1531   BASOSABS 0.0 09/15/2021 1204   BASOSABS 0.0 04/24/2008 1531      Latest Ref Rng & Units 09/15/2021   10:21 PM 09/15/2021   12:04 PM 06/17/2021    6:07 AM  CMP  Glucose 70 - 99 mg/dL 113   119   145    BUN 8 - 23 mg/dL 24   24   32    Creatinine 0.61 - 1.24 mg/dL 1.21   1.16   0.89    Sodium 135 - 145 mmol/L 136   135   137    Potassium 3.5 - 5.1 mmol/L 4.2   3.7   4.5    Chloride 98 - 111 mmol/L 112   108   107    CO2 22 - 32 mmol/L '20   20   25    ' Calcium 8.9 - 10.3 mg/dL 7.8   8.5   8.3    Total Protein 6.5 - 8.1 g/dL  6.8     Total Bilirubin  0.3 - 1.2 mg/dL  0.7     Alkaline Phos 38 - 126 U/L  132     AST 15 - 41 U/L  30     ALT 0 - 44 U/L  24        Radiology Studies: DG Chest 1 View  Result Date: 09/15/2021 CLINICAL DATA:  CHF, bradycardia, weakness and diarrhea. EXAM: CHEST  1 VIEW COMPARISON:  Chest radiograph May 05, 2021 FINDINGS: Cardiomegaly and central vascular prominence. Aortic atherosclerosis. No overt pulmonary edema or focal airspace consolidation. No visible pleural effusion or pneumothorax. No acute osseous abnormality. IMPRESSION: Cardiomegaly and central vascular prominence. No overt pulmonary edema or focal airspace consolidation. Electronically Signed   By: Dellis Filbert  Nance Pew M.D.   On: 09/15/2021 18:19     Scheduled Meds:  allopurinol  300 mg Oral Daily   atorvastatin  80 mg Oral QHS   clopidogrel  75 mg Oral Daily   enoxaparin (LOVENOX) injection  40 mg Subcutaneous Q24H   insulin aspart  0-15 Units Subcutaneous TID WC   insulin detemir  12 Units Subcutaneous Daily   sodium chloride flush  3 mL Intravenous Q12H   Continuous Infusions:  sodium chloride       LOS: 1 day   Time spent: 26  Nita Sells, MD [facility] To contact the attending provider between 7A-7P or the covering provider during after hours 7P-7A, please log into the web site [email or web address] and access using universal [place] password for that web site. If you do not have the password, please call the hospital operator.  09/16/2021, 6:47 AM

## 2021-09-16 NOTE — Progress Notes (Signed)
   09/16/21 2000  Assess: MEWS Score  Temp 98.6 F (37 C)  BP (!) 209/82  Pulse Rate 91  ECG Heart Rate 91  Resp 20  Level of Consciousness Alert  SpO2 96 %  O2 Device Room Air  Assess: MEWS Score  MEWS Temp 0  MEWS Systolic 2  MEWS Pulse 0  MEWS RR 0  MEWS LOC 0  MEWS Score 2  MEWS Score Color Yellow  Assess: if the MEWS score is Yellow or Red  Were vital signs taken at a resting state? Yes  Focused Assessment No change from prior assessment  Early Detection of Sepsis Score *See Row Information* Low  MEWS guidelines implemented *See Row Information* Yes  Treat  MEWS Interventions Administered scheduled meds/treatments;Escalated (See documentation below)  Pain Scale 0-10  Pain Score 0  Take Vital Signs  Increase Vital Sign Frequency  Yellow: Q 2hr X 2 then Q 4hr X 2, if remains yellow, continue Q 4hrs  Escalate  MEWS: Escalate Yellow: discuss with charge nurse/RN and consider discussing with provider and RRT  Notify: Charge Nurse/RN  Name of Charge Nurse/RN Notified Lyda Kalata, RN  Date Charge Nurse/RN Notified 09/16/21  Time Charge Nurse/RN Notified 2000  Document  Patient Outcome Stabilized after interventions (BP lowered, MEWS now green at 2033)  Progress note created (see row info) Yes

## 2021-09-16 NOTE — Progress Notes (Signed)
Secured chatted MD Mahala Menghini and NP Georgie Chard at 1700 related to high blood pressure of 195/96 with HR in the upper 90's to low 100's. I had previously given 100mg  of Hydralazine at 1500 and had no other available PRN meds for high blood pressure. 0.1mg  of clonidine ordered and given, will continue to monitor.

## 2021-09-17 ENCOUNTER — Encounter (HOSPITAL_COMMUNITY): Payer: Self-pay | Admitting: Internal Medicine

## 2021-09-17 DIAGNOSIS — R001 Bradycardia, unspecified: Secondary | ICD-10-CM | POA: Diagnosis not present

## 2021-09-17 LAB — COMPREHENSIVE METABOLIC PANEL
ALT: 19 U/L (ref 0–44)
AST: 25 U/L (ref 15–41)
Albumin: 2.7 g/dL — ABNORMAL LOW (ref 3.5–5.0)
Alkaline Phosphatase: 127 U/L — ABNORMAL HIGH (ref 38–126)
Anion gap: 8 (ref 5–15)
BUN: 11 mg/dL (ref 8–23)
CO2: 17 mmol/L — ABNORMAL LOW (ref 22–32)
Calcium: 8 mg/dL — ABNORMAL LOW (ref 8.9–10.3)
Chloride: 113 mmol/L — ABNORMAL HIGH (ref 98–111)
Creatinine, Ser: 0.76 mg/dL (ref 0.61–1.24)
GFR, Estimated: >60 mL/min (ref >60)
Glucose, Bld: 166 mg/dL — ABNORMAL HIGH (ref 70–99)
Potassium: 3.8 mmol/L (ref 3.5–5.1)
Sodium: 138 mmol/L (ref 135–145)
Total Bilirubin: 1 mg/dL (ref 0.3–1.2)
Total Protein: 6.5 g/dL (ref 6.5–8.1)

## 2021-09-17 LAB — GLUCOSE, CAPILLARY
Glucose-Capillary: 108 mg/dL — ABNORMAL HIGH (ref 70–99)
Glucose-Capillary: 137 mg/dL — ABNORMAL HIGH (ref 70–99)
Glucose-Capillary: 168 mg/dL — ABNORMAL HIGH (ref 70–99)
Glucose-Capillary: 108 mg/dL — ABNORMAL HIGH (ref 70–99)

## 2021-09-17 LAB — MAGNESIUM: Magnesium: 1.7 mg/dL (ref 1.7–2.4)

## 2021-09-17 MED ORDER — HYDRALAZINE HCL 50 MG PO TABS
150.0000 mg | ORAL_TABLET | Freq: Three times a day (TID) | ORAL | Status: DC
  Administered 2021-09-17 – 2021-09-18 (×3): 150 mg via ORAL
  Filled 2021-09-17 (×3): qty 3

## 2021-09-17 MED ORDER — HYDRALAZINE HCL 20 MG/ML IJ SOLN
5.0000 mg | Freq: Once | INTRAMUSCULAR | Status: AC
Start: 2021-09-17 — End: 2021-09-17
  Administered 2021-09-17: 5 mg via INTRAVENOUS
  Filled 2021-09-17: qty 1

## 2021-09-17 MED ORDER — HYDRALAZINE HCL 20 MG/ML IJ SOLN
10.0000 mg | Freq: Once | INTRAMUSCULAR | Status: AC
  Administered 2021-09-17: 10 mg via INTRAVENOUS
  Filled 2021-09-17: qty 1

## 2021-09-17 MED ORDER — CLONIDINE HCL 0.1 MG PO TABS
0.2000 mg | ORAL_TABLET | Freq: Three times a day (TID) | ORAL | Status: DC
  Administered 2021-09-17 – 2021-09-18 (×2): 0.2 mg via ORAL
  Filled 2021-09-17 (×2): qty 2

## 2021-09-17 NOTE — TOC Initial Note (Signed)
Transition of Care [facility]) - Initial/Assessment Note    Patient Details  Name: Manuel Chapman MRN: [id number] Date of Birth: [birth year]  Transition of [facility] CM/SW Contact:    Lynett Grimes Phone Number: 09/17/2021, 4:21 PM  Clinical Narrative:                 CSW spoke with pt about DC plan. CSW introduced self and explained role at the hospital. Pt reports that PTA he stays long term at [facility]. PT reports pt needs assistance with ADLs. CSW reviewed PT/OT recommendations for SNF. Pt reports being ready to DC.  CSW spoke with admissions to confirm pt LTC and pt possible return tomorrow.  CSW will continue to follow.          Patient Goals and CMS Choice        Expected Discharge Plan and Services                                                Prior Living Arrangements/Services                       Activities of Daily Living Home Assistive Devices/Equipment: Wheelchair ADL Screening (condition at time of admission) Patient's cognitive ability adequate to safely complete daily activities?: Yes Is the patient deaf or have difficulty hearing?: No Does the patient have difficulty seeing, even when wearing glasses/contacts?: No Does the patient have difficulty concentrating, remembering, or making decisions?: No Patient able to express need for assistance with ADLs?: Yes Does the patient have difficulty dressing or bathing?: Yes Independently performs ADLs?: No Communication: Independent Dressing (OT): Needs assistance Is this a change from baseline?: Pre-admission baseline Grooming: Needs assistance Is this a change from baseline?: Pre-admission baseline Feeding: Independent Bathing: Needs assistance Is this a change from baseline?: Pre-admission baseline Toileting: Needs assistance Is this a change from baseline?: Pre-admission baseline In/Out Bed: Needs assistance Is this a change from baseline?: Pre-admission  baseline Walks in Home: Needs assistance Is this a change from baseline?: Pre-admission baseline Does the patient have difficulty walking or climbing stairs?: Yes Weakness of Legs: Both Weakness of Arms/Hands: Left  Permission Sought/Granted                  Emotional Assessment              Admission diagnosis:  Bradycardia [R00.1] Weakness [R53.1] Patient Active Problem List   Diagnosis Date Noted   Elevated alkaline phosphatase level 09/16/2021   Iron deficiency anemia 09/16/2021   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) 09/16/2021   Aortic stenosis 09/16/2021   Second degree AV block 09/15/2021   Bradycardia 09/15/2021   Labile blood glucose    Uncontrolled type 2 diabetes mellitus with hyperglycemia (HCC)    Essential hypertension    Tendonitis of left rotator cuff    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   Acute CVA (cerebrovascular accident) (HCC) 05/05/2021   Pressure injury of skin 11/30/2019   Hyperglycemia    Hypokalemia    Acute blood loss anemia    ICH (intracerebral hemorrhage) (HCC) 11/29/2019   Gout 05/10/2015   HTN (hypertension) 05/10/2015   Dyslipidemia 05/10/2015   Diabetes (HCC) 04/10/2015   PCP:  Burton Apley, MD Pharmacy:   [facility] [phone] [place], [place] -  [address] [place] [address] Phone: [phone] Fax: [phone]     Social Determinants of Health (SDOH) Interventions Food Insecurity Interventions: Intervention Not Indicated Financial Strain Interventions: Intervention Not Indicated Housing Interventions: Intervention Not Indicated Transportation Interventions: Intervention Not Indicated  Readmission Risk Interventions     View : No data to display.

## 2021-09-17 NOTE — Progress Notes (Signed)
  [facility]   MULTIDISCIPLINARY HEART VALVE TEAM   Patient is scheduled with Dr. Lynnette Caffey for structural heart follow up to further discuss TAVR workup on 12/18/21 at 1:20pm. This was placed in the patients AVS.   Georgie Chard NP-C Structural Heart Team  Pager: [phone] Phone: [phone]

## 2021-09-17 NOTE — Progress Notes (Addendum)
PROGRESS NOTE     Manuel Chapman  JXB:[id number] DOB: May 10, 1945 DOA: 09/15/2021 PCP: Lorene Dy, MD  Brief Narrative:  76 year old [place] dwelling black male HFrEF-last echocardiogram = EF 45-50%, akinesis L basal and mid lateral wall + grade 2 DD Subarachnoid hemorrhage 11/2019 with recent R PCA infarct 05/02/2021 (discharge to CIR)--- patient declined loop recorder --on 5/8, ?pick up a 30-day event monitor subsequently---- because his blood pressure was uncontrolled--- on that admission he was started and initiated on higher doses of hydralazine HTN IDDM Prior heavy EtOH use, gout, arthritis, HLD  Present from [facility] 5/23 c 3/7 h/o diarrhea-reddish tint to sheet when passing stools While being triaged heart rate dropped from 60 with first-degree block to possible type II Mobitz block in the 70s Cardiology was consulted by EDP who recommended cessation of all beta-blockade No further dark stools noted in the ED  Patient admitted to progressive care for aggressive heart rate monitoring-potassium replaced magnesium checked troponin checked-bolus IV fluid 500 cc given, saline rate 100 cc/H given Noted severe AoS on prior echo    Hospital-Problem based course  Bradycardia secondary to beta-blocker [resolving] BB discontinued-- now actually somewhat tachycardic Monitor trends over the next day Uncontrolled hypertension Previous blood pressure regimen included Toprol-XL 25 clonidine 0.2 3 times daily which may have caused bradycardia Hygroton on on hold- started HCTZ 25, lisinopril 40, increased p.o. hydralazine to 150 3 times daily-- increase clonidine back to 0.2 x 3 times daily Goal blood pressure about 150 eventually and may need to be titrated in the outpatient setting-I discussed this with Dr. 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 Anemia of iron deficiency with iron level 36 saturations 13 Start p.o. FeSO4 twice daily Gout--quiescent Mildly elevated alk phos-etiology unclear-monitor-outpatient work-up-could have some fatty liver?  DVT prophylaxis: Lovenox Code Status: Full Family Communication:  Disposition:  Status is: Inpatient Remains inpatient appropriate because:   Bradycardic somewhat--- needs further monitoring to ensure does not drop heart rate again-could go back to facility as early as a.m. 5/25 if resolved and several metabolic abnormalities are resolved also   Consultants:    Procedures:   Antimicrobials:     Subjective:  Wants to go back to his nursing facility-no distress No chest pain or fever looks like we were battling blood pressures over 160 most of the night but we made adjustments yesterday Evening  Objective: Vitals:   09/17/21 0520 09/17/21 0630 09/17/21 0700 09/17/21 0752  BP: (!) 202/82 (!) 178/78 (!) 163/128 (!) 176/79  Pulse: 94 99 (!) 101 (!) 104  Resp: '18 19 19 19  ' Temp: 98.3 F (36.8 C)  98.6 F (37 C)   TempSrc: Oral  Oral   SpO2: 96% 98% 96% 96%  Weight:      Height:        Intake/Output Summary (Last 24 hours) at 09/17/2021  [place] filed at 09/17/2021 0630 Gross per 24 hour  Intake 2873.77 ml  Output 1350 ml  Net 1523.77 ml    Filed  Weights   09/15/21 1851 09/16/21 0311 09/17/21 0440  Weight: 96.5 kg 96.3 kg 98.7 kg    Examination:  Coherent awake no distress Thick neck poor dentition Mallampati 4 Holosystolic murmur Abdomen obese nontender no rebound no guarding Trace lower extremity edema No JVD Neurologically intact   Data Reviewed: personally reviewed   CBC    Component Value Date/Time   WBC 7.1 09/15/2021 1204   RBC 3.22 (L) 09/15/2021 1204   HGB 9.4 (L) 09/15/2021 1204   HGB 14.3 04/24/2008 1531   HCT 29.1 (L) 09/15/2021 1204   HCT 42.9 04/24/2008 1531   PLT 266 09/15/2021 1204   PLT 219 04/24/2008 1531   MCV 90.4 09/15/2021 1204   MCV 93.4 04/24/2008 1531   MCH 29.2 09/15/2021 1204   MCHC 32.3 09/15/2021 1204   RDW 16.3 (H) 09/15/2021 1204   RDW 17.0 (H) 04/24/2008 1531   LYMPHSABS 1.5 09/15/2021 1204   LYMPHSABS 5.6 (H) 04/24/2008 1531   MONOABS 0.4 09/15/2021 1204   MONOABS 0.5 04/24/2008 1531   EOSABS 0.1 09/15/2021 1204   EOSABS 0.0 04/24/2008 1531   BASOSABS 0.0 09/15/2021 1204   BASOSABS 0.0 04/24/2008 1531      Latest Ref Rng & Units 09/15/2021   10:21 PM 09/15/2021   12:04 PM 06/17/2021    6:07 AM  CMP  Glucose 70 - 99 mg/dL 113   119   145    BUN 8 - 23 mg/dL 24   24   32    Creatinine 0.61 - 1.24 mg/dL 1.21   1.16   0.89    Sodium 135 - 145 mmol/L 136   135   137    Potassium 3.5 - 5.1 mmol/L 4.2   3.7   4.5    Chloride 98 - 111 mmol/L 112   108   107    CO2 22 - 32 mmol/L '20   20   25    ' Calcium 8.9 - 10.3 mg/dL 7.8   8.5   8.3    Total Protein 6.5 - 8.1 g/dL  6.8     Total Bilirubin 0.3 - 1.2 mg/dL  0.7     Alkaline Phos 38 - 126 U/L  132     AST 15 - 41 U/L  30     ALT 0 - 44 U/L  24        Radiology Studies: DG Chest 1 View  Result Date: 09/15/2021 CLINICAL DATA:  CHF, bradycardia, weakness and diarrhea. EXAM: CHEST  1 VIEW COMPARISON:  Chest radiograph May 05, 2021 FINDINGS: Cardiomegaly and central vascular prominence. Aortic atherosclerosis. No overt  pulmonary edema or focal airspace consolidation. No visible pleural effusion or pneumothorax. No acute osseous abnormality. IMPRESSION: Cardiomegaly and central vascular prominence. No overt pulmonary edema or focal airspace consolidation. Electronically Signed   By: Dahlia Bailiff M.D.   On: 09/15/2021 18:19   ECHOCARDIOGRAM COMPLETE  Result Date: 09/16/2021    ECHOCARDIOGRAM REPORT   Patient Name:   Manuel Chapman Date of Exam: 09/16/2021 Medical Rec #:  [id number]             Height:       66.0 in Accession #:    [id number]            Weight:  212.3 lb Date of Birth:  05/15/1945             BSA:          2.051 m Patient Age:    26 years              BP:           166/79 mmHg Patient Gender: M                     HR:           82 bpm. Exam Location:  Inpatient Procedure: 2D Echo, Cardiac Doppler, Color Doppler and Strain Analysis Indications:    Bradycardia; murmur  History:        Patient has prior history of Echocardiogram examinations, most                 recent 05/05/2021. Stroke, Signs/Symptoms:Murmur; Risk                 Factors:Diabetes and Hypertension.  Sonographer:    [place] Referring Phys: [id number] [place]  1. Left ventricular ejection fraction, by estimation, is 55 to 60%. The left ventricle has normal function. The left ventricle has no regional wall motion abnormalities. There is mild left ventricular hypertrophy. Left ventricular diastolic parameters are consistent with Grade II diastolic dysfunction (pseudonormalization).  2. Right ventricular systolic function is normal. The right ventricular size is normal. There is normal pulmonary artery systolic pressure. The estimated right ventricular systolic pressure is 94.5 mmHg.  3. Left atrial size was mildly dilated.  4. The mitral valve is normal in structure. No evidence of mitral valve regurgitation. No evidence of mitral stenosis.  5. The aortic valve is tricuspid. There is severe calcifcation of the aortic  valve. Aortic valve regurgitation is not visualized. Severe aortic valve stenosis. Aortic valve mean gradient measures 39.0 mmHg, AVA 0.88 cm^2.  6. The inferior vena cava is normal in size with <50% respiratory variability, suggesting right atrial pressure of 8 mmHg. FINDINGS  Left Ventricle: Left ventricular ejection fraction, by estimation, is 55 to 60%. The left ventricle has normal function. The left ventricle has no regional wall motion abnormalities. The left ventricular internal cavity size was normal in size. There is  mild left ventricular hypertrophy. Left ventricular diastolic parameters are consistent with Grade II diastolic dysfunction (pseudonormalization). Right Ventricle: The right ventricular size is normal. No increase in right ventricular wall thickness. Right ventricular systolic function is normal. There is normal pulmonary artery systolic pressure. The tricuspid regurgitant velocity is 2.56 m/s, and  with an assumed right atrial pressure of 8 mmHg, the estimated right ventricular systolic pressure is 85.9 mmHg. Left Atrium: Left atrial size was mildly dilated. Right Atrium: Right atrial size was normal in size. Pericardium: There is no evidence of pericardial effusion. Mitral Valve: The mitral valve is normal in structure. No evidence of mitral valve regurgitation. No evidence of mitral valve stenosis. MV peak gradient, 11.4 mmHg. The mean mitral valve gradient is 5.0 mmHg. Tricuspid Valve: The tricuspid valve is normal in structure. Tricuspid valve regurgitation is trivial. Aortic Valve: The aortic valve is tricuspid. There is severe calcifcation of the aortic valve. Aortic valve regurgitation is not visualized. Severe aortic stenosis is present. Aortic valve mean gradient measures 39.0 mmHg. Aortic valve peak gradient measures 47.4 mmHg. Aortic valve area, by VTI measures 1.31 cm. Pulmonic Valve: The pulmonic valve was normal in structure. Pulmonic valve regurgitation is not  visualized.  Aorta: The aortic root is normal in size and structure. Venous: The inferior vena cava is normal in size with less than 50% respiratory variability, suggesting right atrial pressure of 8 mmHg. IAS/Shunts: No atrial level shunt detected by color flow Doppler.  LEFT VENTRICLE PLAX 2D LVIDd:         5.00 cm     Diastology LVIDs:         4.10 cm     LV e' medial:    6.31 cm/s LV PW:         1.10 cm     LV E/e' medial:  20.9 LV IVS:        1.40 cm     LV e' lateral:   5.68 cm/s LVOT diam:     2.10 cm     LV E/e' lateral: 23.2 LV SV:         86 LV SV Index:   42 LVOT Area:     3.46 cm  LV Volumes (MOD) LV vol d, MOD A2C: 98.5 ml LV vol d, MOD A4C: 87.8 ml LV vol s, MOD A2C: 29.1 ml LV vol s, MOD A4C: 47.7 ml LV SV MOD A2C:     69.4 ml LV SV MOD A4C:     87.8 ml LV SV MOD BP:      57.5 ml RIGHT VENTRICLE RV Basal diam:  3.60 cm RV Mid diam:    2.30 cm RV S prime:     11.50 cm/s TAPSE (M-mode): 1.9 cm LEFT ATRIUM             Index        RIGHT ATRIUM           Index LA diam:        4.10 cm 2.00 cm/m   RA Area:     17.50 cm LA Vol (A2C):   63.5 ml 30.96 ml/m  RA Volume:   52.00 ml  25.35 ml/m LA Vol (A4C):   62.8 ml 30.62 ml/m LA Biplane Vol: 67.0 ml 32.66 ml/m  AORTIC VALVE                     PULMONIC VALVE AV Area (Vmax):    1.22 cm      PV Vmax:       1.16 m/s AV Area (Vmean):   1.22 cm      PV Vmean:      79.250 cm/s AV Area (VTI):     1.31 cm      PV VTI:        0.216 m AV Vmax:           344.20 cm/s   PV Peak grad:  5.3 mmHg AV Vmean:          229.800 cm/s  PV Mean grad:  3.0 mmHg AV VTI:            0.658 m AV Peak Grad:      47.4 mmHg AV Mean Grad:      39.0 mmHg LVOT Vmax:         121.00 cm/s LVOT Vmean:        80.700 cm/s LVOT VTI:          0.248 m LVOT/AV VTI ratio: 0.38  AORTA Ao Root diam: 3.10 cm Ao Asc diam:  3.10 cm MITRAL VALVE                TRICUSPID VALVE  MV Area (PHT): 4.60 cm     TR Peak grad:   26.2 mmHg MV Area VTI:   2.10 cm     TR Vmax:        256.00 cm/s MV Peak grad:  11.4 mmHg MV Mean  grad:  5.0 mmHg     SHUNTS MV Vmax:       1.69 m/s     Systemic VTI:  0.25 m MV Vmean:      106.0 cm/s   Systemic Diam: 2.10 cm MV Decel Time: 165 msec MV E velocity: 132.00 cm/s MV A velocity: 126.00 cm/s MV E/A ratio:  1.05 Dalton McleanMD Electronically signed by Franki Monte Signature Date/Time: 09/16/2021/5:06:36 PM    Final      Scheduled Meds:  allopurinol  300 mg Oral Daily   atorvastatin  80 mg Oral QHS   cloNIDine  0.1 mg Oral BID   clopidogrel  75 mg Oral Daily   enoxaparin (LOVENOX) injection  40 mg Subcutaneous Q24H   ferrous sulfate  325 mg Oral BID WC   hydrALAZINE  150 mg Oral Q8H   hydrochlorothiazide  25 mg Oral Daily   insulin aspart  0-15 Units Subcutaneous TID WC   insulin detemir  12 Units Subcutaneous Daily   lisinopril  40 mg Oral Daily   Muscle Rub   Topical BID   polyethylene glycol  17 g Oral Daily   sodium chloride flush  3 mL Intravenous Q12H   Continuous Infusions:  sodium chloride     sodium chloride 100 mL/hr at 09/17/21 0329     LOS: 2 days   Time spent: [place], MD [facility] To contact the attending provider between 7A-7P or the covering provider during after hours 7P-7A, please log into the web site [email or web address] and access using universal [place] password for that web site. If you do not have the password, please call the hospital operator.  09/17/2021, 9:44 AM

## 2021-09-17 NOTE — NC FL2 (Signed)
[place] LEVEL OF CARE SCREENING TOOL     IDENTIFICATION  Patient Name: Manuel Chapman Birthdate: December 26, 1945 Sex: male Admission Date (Current Location): 09/15/2021  [facility] and [place] Number:  Herbalist and Address:  [facility] 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) 09/16/2021   Aortic stenosis 09/16/2021   Second degree AV block 09/15/2021   Bradycardia 09/15/2021   Labile blood glucose    Uncontrolled type 2 diabetes mellitus with hyperglycemia (HCC)    Essential hypertension    Tendonitis of left rotator cuff    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   Acute CVA (cerebrovascular accident) ([place]) 05/05/2021   Pressure injury of skin 11/30/2019   Hyperglycemia    Hypokalemia    Acute blood loss anemia    ICH (intracerebral hemorrhage) ([place]) 11/29/2019   Gout 05/10/2015   HTN (hypertension) 05/10/2015   Dyslipidemia 05/10/2015   Diabetes ([place]) 04/10/2015    Orientation RESPIRATION BLADDER Height & Weight     Self, Time, Situation, Place  Normal Incontinent, External catheter Weight: 217 lb 9.5 oz (98.7 kg) Height:  5\' 6"  (167.6 cm)  BEHAVIORAL SYMPTOMS/MOOD NEUROLOGICAL BOWEL NUTRITION STATUS      Incontinent Diet (See DC Summary)  AMBULATORY STATUS COMMUNICATION OF NEEDS Skin    Limited Assist Verbally Normal                       Personal Care Assistance Level of Assistance  Bathing, Feeding, Dressing Bathing Assistance: Limited assistance Feeding assistance: Independent Dressing Assistance: Limited assistance     Functional Limitations Info  Sight, Hearing, Speech Sight Info: Adequate Hearing Info: Adequate Speech Info: Adequate    SPECIAL CARE FACTORS FREQUENCY  PT (By licensed PT), OT (By licensed OT)     PT Frequency: 5x a Week OT Frequency: 5x a Week            Contractures Contractures Info: Not present    Additional Factors Info  Code Status, Allergies Code Status Info: Full Allergies Info: NKA           Current Medications (09/17/2021):  This is the current hospital active medication list Current Facility-Administered Medications  Medication Dose Route Frequency Provider Last Rate Last Admin   0.9 %  sodium chloride infusion  250 mL Intravenous PRN Wynetta Fines T, MD       0.9 %  sodium chloride infusion   Intravenous Continuous Nita Sells, MD 100 mL/hr at 09/17/21 1347 New Bag at 09/17/21 1347   acetaminophen (TYLENOL) tablet 650 mg  650 mg Oral Q4H PRN Lequita Halt, MD       acetaminophen (TYLENOL) tablet 650 mg  650 mg Oral Q4H PRN Wynetta Fines T, MD       allopurinol (ZYLOPRIM) tablet 300 mg  300 mg Oral Daily Lequita Halt, MD  300 mg at 09/17/21 0945   atorvastatin (LIPITOR) tablet 80 mg  80 mg Oral QHS Wynetta Fines T, MD   80 mg at 09/16/21 2108   atropine injection 0.4 mg  0.4 mg Intravenous Q2H PRN Wynetta Fines T, MD       cloNIDine (CATAPRES) tablet 0.1 mg  0.1 mg Oral BID Nita Sells, MD   0.1 mg at 09/17/21 0945   clopidogrel (PLAVIX) tablet 75 mg  75 mg Oral Daily Wynetta Fines T, MD   75 mg at 09/17/21 0945   enoxaparin (LOVENOX) injection 40 mg  40 mg Subcutaneous Q24H Wynetta Fines T, MD   40 mg at 09/16/21 2107   ferrous sulfate tablet 325 mg  325 mg Oral BID WC Nita Sells, MD   325 mg  at 09/17/21 0945   hydrALAZINE (APRESOLINE) tablet 150 mg  150 mg Oral Q8H Samtani, Darylene Price, MD   150 mg at 09/17/21 1347   hydrochlorothiazide (HYDRODIURIL) tablet 25 mg  25 mg Oral Daily Nita Sells, MD   25 mg at 09/17/21 0945   insulin aspart (novoLOG) injection 0-15 Units  0-15 Units Subcutaneous TID WC Lequita Halt, MD   2 Units at 09/17/21 1212   insulin detemir (LEVEMIR) injection 12 Units  12 Units Subcutaneous Daily Wynetta Fines T, MD   12 Units at 09/17/21 0946   lisinopril (ZESTRIL) tablet 40 mg  40 mg Oral Daily Kathyrn Drown D, NP   40 mg at 09/17/21 0945   Muscle Rub CREA   Topical BID Nita Sells, MD   1 application. at 09/16/21 2108   ondansetron (ZOFRAN) injection 4 mg  4 mg Intravenous Q6H PRN Lequita Halt, MD       ondansetron [facility]) tablet 4 mg  4 mg Oral Q8H PRN Wynetta Fines T, MD       polyethylene glycol (MIRALAX / GLYCOLAX) packet 17 g  17 g Oral Daily Nita Sells, MD       sodium chloride flush (NS) 0.9 % injection 3 mL  3 mL Intravenous Q12H Wynetta Fines T, MD       sodium chloride flush (NS) 0.9 % injection 3 mL  3 mL Intravenous PRN Lequita Halt, MD         Discharge Medications: Please see discharge summary for a list of discharge medications.  Relevant Imaging Results:  Relevant Lab Results:   Additional Information SS# [id number]  Reece Agar, LCSWA

## 2021-09-17 NOTE — Progress Notes (Signed)
Physical Therapy Treatment Patient Details Name: Manuel Chapman MRN: [id number] DOB: [birth year] Today's Date: 09/17/2021   History of Present Illness Patient is a 76 y/o male who presents on 5/23 with weakness and lethargy with concern for GI bleed from staff at [facility] place. Found to have symptomatic bradycardia. 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    PT Comments    Pt received in supine, A&O to self only and delirious, needing frequent reorientation to task/situation. Pt frustrated with hospital stay and perseverating on discomfort of BP cuff and wanting to return to SNF. Pt needing increased assist, up to modA for bed mobility and sit<>stand and limited to pivotal transfers from bed>chair. Pt tachy to 135 bpm while pivoting to chair and pt more hypertensive after transfer, see comments. Chair alarm on for safety, NT/RN aware pt up in recliner. Pt continues to benefit from PT services to progress toward functional mobility goals.   Recommendations for follow up therapy are one component of a multi-disciplinary discharge planning process, led by the attending physician.  Recommendations may be updated based on patient status, additional functional criteria and insurance authorization.  Follow Up Recommendations  Skilled nursing-short term rehab (<3 hours/day) (with continued PT)     Assistance Recommended at Discharge Frequent or constant Supervision/Assistance  Patient can return home with the following A little help with bathing/dressing/bathroom;Assistance with cooking/housework;Help with stairs or ramp for entrance;A lot of help with walking and/or transfers   Equipment Recommendations  None recommended by PT    Recommendations for Other Services       Precautions / Restrictions Precautions Precautions: Fall Precaution Comments: watch BP, Afib/bradycardia Restrictions Weight Bearing Restrictions: No     Mobility  Bed  Mobility Overal bed mobility: Needs Assistance Bed Mobility: Rolling, Sidelying to Sit Rolling: Min assist Sidelying to sit: Mod assist       General bed mobility comments: Step by step cues for sequenicng/self-assist when sitting up to L EOB; via log roll with heavy reliance on bed rail, although pt able to prop up on his L elbow; minA to roll to L and modA for hip translation to EOB and trunk raising.    Transfers Overall transfer level: Needs assistance Equipment used: Rolling walker (2 wheels) Transfers: Sit to/from Stand Sit to Stand: Min assist, Mod assist           General transfer comment: cues for hand placement. Stood from [employer] with modA and use of momentum (pt unable to stand with minA on initial attempt) and needs minA for stand>sit to recliner.    Ambulation/Gait Ambulation/Gait assistance: Min assist Gait Distance (Feet): 5 Feet Assistive device: Rolling walker (2 wheels) Gait Pattern/deviations: Step-to pattern, Decreased stride length, Shuffle, Trunk flexed, Step-through pattern       General Gait Details: R knee hyperextension during stance phase to prevent buckling. Pt with limited distance due to confusion/fatigue and tachycardia. Of note, BP elevated to 184/93 (121) after pivotal steps to recliner, NT/RN notified and charted in flowsheet.   Stairs             Wheelchair Mobility    Modified Rankin (Stroke Patients Only)       Balance Overall balance assessment: Needs assistance Sitting-balance support: Feet supported, No upper extremity supported Sitting balance-Leahy Scale: Fair     Standing balance support: Bilateral upper extremity supported, Reliant on assistive device for balance Standing balance-Leahy Scale: Poor Standing balance comment: reliant on UE  support of RW                            Cognition Arousal/Alertness: Awake/alert Behavior During Therapy: WFL for tasks assessed/performed Overall Cognitive Status:  Within Functional Limits for tasks assessed                                 General Comments: Pt confused, stating that Elyn Peers was across the hall and kept asking if someone was standing behind his bed/in corner of his room. No one else in room. Pt perseverating on wanting to go back to SNF and that he was tired of "having my arm squeezed".        Exercises Other Exercises Other Exercises: seated BLE AROM: ankle pumps, LAQ x10 reps ea    General Comments General comments (skin integrity, edema, etc.): BP 175/80 supine prior to mobility and BP 184/93 (121) seated in recliner post-exertion; HR tachy to 135 bpm with pivotal transfer      Pertinent Vitals/Pain Pain Assessment Pain Assessment: Faces Faces Pain Scale: Hurts little more Pain Location: arm when BP cuff used (either arm) Pain Descriptors / Indicators: Discomfort, Grimacing     PT Goals (current goals can now be found in the care plan section) Acute Rehab PT Goals Patient Stated Goal: return to PLOF PT Goal Formulation: With patient Time For Goal Achievement: 09/30/21 Progress towards PT goals: Progressing toward goals    Frequency    Min 3X/week      PT Plan Current plan remains appropriate       AM-PAC PT "6 Clicks" Mobility   Outcome Measure  Help needed turning from your back to your side while in a flat bed without using bedrails?: A Little Help needed moving from lying on your back to sitting on the side of a flat bed without using bedrails?: A Lot Help needed moving to and from a bed to a chair (including a wheelchair)?: A Lot Help needed standing up from a chair using your arms (e.g., wheelchair or bedside chair)?: A Lot Help needed to walk in hospital room?: Total Help needed climbing 3-5 steps with a railing? : Total 6 Click Score: 11    End of Session Equipment Utilized During Treatment: Gait belt Activity Tolerance: Patient tolerated treatment well;Treatment limited  secondary to medical complications (Comment) (hypertension with minimal exertion, HR elevated to mid-130's with minimal exertion) Patient left: in chair;with call bell/phone within reach;with chair alarm set Nurse Communication: Mobility status;Other (comment) (unstable vitals, possible delirium) PT Visit Diagnosis: Muscle weakness (generalized) (M62.81);Unsteadiness on feet (R26.81);Difficulty in walking, not elsewhere classified (R26.2)     Time: [id number] PT Time Calculation (min) (ACUTE ONLY): 24 min  Charges:  $Therapeutic Activity: 23-37 mins                     Jakob Kimberlin P., PTA Acute Rehabilitation Services Secure Chat Preferred 9a-5:30pm Office: [place] 09/17/2021, 4:48 PM

## 2021-09-17 NOTE — Plan of Care (Signed)
  Problem: Activity: Goal: Risk for activity intolerance will decrease Outcome: Not Progressing   Problem: Elimination: Goal: Will not experience complications related to bowel motility Outcome: Not Progressing   Problem: Skin Integrity: Goal: Risk for impaired skin integrity will decrease Outcome: Not Progressing   Problem: Education: Goal: Knowledge of General Education information will improve Description: Including pain rating scale, medication(s)/side effects and non-pharmacologic comfort measures Outcome: Progressing   Problem: Health Behavior/Discharge Planning: Goal: Ability to manage health-related needs will improve Outcome: Progressing   Problem: Clinical Measurements: Goal: Ability to maintain clinical measurements within normal limits will improve Outcome: Progressing Goal: Will remain free from infection Outcome: Progressing Goal: Diagnostic test results will improve Outcome: Progressing Goal: Respiratory complications will improve Outcome: Progressing Goal: Cardiovascular complication will be avoided Outcome: Progressing   Problem: Nutrition: Goal: Adequate nutrition will be maintained Outcome: Progressing   Problem: Coping: Goal: Level of anxiety will decrease Outcome: Progressing   Problem: Elimination: Goal: Will not experience complications related to urinary retention Outcome: Progressing   Problem: Pain Managment: Goal: General experience of comfort will improve Outcome: Progressing   Problem: Safety: Goal: Ability to remain free from injury will improve Outcome: Progressing

## 2021-09-17 NOTE — Progress Notes (Signed)
Heart Failure Nurse Navigator Progress Note  PCP: Burton Apley, MD PCP-Cardiologist: Jonette Eva Admission Diagnosis: Bradycardia and weakness Admitted from: [name]  Presentation:   Manuel Chapman presented with diarrhea and fatigue, bradycardia heart rate in the 40's. CXR in ED showed Cardiomegaly, Echo improved to 55-60%. BP 104/40, Heart rate 32, IV fluid bolus 500 cc given.   Patient was educated on the sign and symptoms of heart failure, importance od daily weights (states they weigh him everyday at [facility]) diet/ fluid restrictions, (patients states , he likes to eat what he wants to eat, and what they feed him there). Educated on the importance of taking all his medications and attending all his medical appointments. Patient is scheduled for a hospital follow up on 09/25/21 @ 2 pm.   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) 09/16/2021   CVA (cerebral vascular accident) (HCC) 2020   Diabetes mellitus    Hypertension    ICH (intracerebral hemorrhage) (HCC) 11/2019   Iron deficiency anemia 09/16/2021   Metabolic acidosis 09/16/2021     Social History   Socioeconomic History   Marital status: Married    Spouse name: Gaylyn Rong   Number of children: 4   Years of education: Not on file   Highest education level: High school graduate  Occupational History   Occupation: retired  Tobacco Use   Smoking status: Former    Packs/day: 1.00    Years: 10.00    Pack years: 10.00    Types: Cigarettes    Quit date: 1998    Years since quitting: 25.4   Smokeless tobacco: Never  Vaping Use   Vaping Use: Never used  Substance and Sexual Activity   Alcohol use: Not Currently    Comment: not much, h/o heavy beer use   Drug use: No   Sexual activity: Not on file  Other Topics Concern   Not on  file  Social History Narrative   Not on file   Social Determinants of Health   Financial Resource Strain: Low Risk    Difficulty of Paying Living Expenses: Not hard at all  Food Insecurity: No Food Insecurity   Worried About Programme researcher, broadcasting/film/video in the Last Year: Never true   Ran Out of Food in the Last Year: Never true  Transportation Needs: No Transportation Needs   Lack of Transportation (Medical): No   Lack of Transportation (Non-Medical): No  Physical Activity: Not on file  Stress: Not on file  Social Connections: Not on file   Education Assessment and Provision:  Detailed education and instructions provided on heart failure disease management including the following:  Signs and symptoms of Heart Failure When to call the physician Importance of daily weights Low sodium diet Fluid restriction Medication management Anticipated future follow-up appointments  Patient education given on each of the above topics.  Patient acknowledges understanding via teach back method and acceptance of all instructions.  Education Materials:  "Living Better With Heart Failure" Booklet, HF zone tool, & Daily Weight Tracker Tool.  Patient has scale at home: yes, they have one at facility. Patient has pill box at home: NA    High Risk Criteria for Readmission and/or Poor Patient Outcomes: Heart failure hospital admissions (last 6 months): 0  No Show rate: $ % Difficult social situation: No Demonstrates medication adherence: Yes  Primary Language: English Literacy level: Reading and writing  Barriers of Care:   Diet/fluids- non compliant at times per patient Activity level  Considerations/Referrals:   Referral made to Heart Failure Pharmacist Stewardship: yes Referral made to Heart Failure CSW/NCM TOC: No Referral made to Heart & Vascular TOC clinic: yes, 09/24/21  Items for Follow-up on DC/TOC: Diet/ fluids - per patient likes to eat what he wants.  Medication changes   Rhae Hammock, BSN, Scientist, clinical (histocompatibility and immunogenetics) Only

## 2021-09-17 NOTE — Progress Notes (Signed)
Heart Failure Stewardship Pharmacist Progress Note   PCP: Lorene Dy, MD PCP-Cardiologist: None    HPI:  76 yo male with PMH of HTN, IDDM, CHF, CVA in Jan 2023, [place] left subcortical and right temporal embolic infarct in [id number].  Hospitalized 01/14 through 03/08 for cerebral infarction with notable asymptomatic bradycardia in 50-60s.  He presented to [facility] ED 05/23 from [facility] with chief complaint of diarrhea and fatigue, found to have significant bradycardia with HR in 40s.  PTA clonidine 0.2 mg TID and Toprol XL 25 mg daily stopped, PTA lisinopril dose cut in half to 20 mg daily.  CXR in the ED showed Cardiomegaly and central vascular prominence with no overt pulmonary edema.  Echo 05/23 showed LVEF of 55-60% (improved from 45-50% in 04/2021), normal RV which improved from mildly reduced function in Jan 2023, and persistent G2DD and severe AV stenosis since 04/2021.  Cardiology was consulted and patient was deemed a marginal candidate for AV intervention in the absence of symptoms.    Notably, patient was taking chlorthalidone 50 mg daily, hydralazine 100 mg QID, lisinopril 40mg  daily, clonidine 0.2 mg TID and Toprol XL 25 daily prior to admission.   Current HF Medications: ACE/ARB/ARNI: lisinopril 20 mg daily Other: hydralazine 150 mg q8h  Prior to admission HF Medications: Beta blocker: metoprolol succinate 25 mg daily ACE/ARB/ARNI: lisinopril 40 mg daily Other: hydralazine 100 mg QID  Pertinent Lab Values: Serum creatinine 1.21, BUN 24, Potassium , Sodium 136, Magnesium 2, A1c 9.9% 09/15/2021 Iron Studies: Iron 36, Ferritin 71, Tsat 13%  Vital Signs: Weight: 217 lbs - 5 lb weight gain (admission weight: 212 lbs) Blood pressure: labile - 150-90/80-90s (goal BP 150) Heart rate: labile - 50-90s  I/O: -1.85 L yesterday; net + 1.746 L   Medication Assistance / Insurance Benefits Check: Does the patient have prescription insurance?  Yes Type of insurance plan: [place]  Medicare  Outpatient Pharmacy:  Prior to admission outpatient pharmacy: [facility] Is the patient willing to use [place] pharmacy at discharge? Pending Is the patient willing to transition their outpatient pharmacy to utilize a [facility] outpatient pharmacy?   Pending    Assessment: 1. Acute on chronic diastolic CHF with [id number] (LVEF 55-60%) due to severe AV stenosis. NYHA class II symptoms. - New iron deficient anemia in setting of CHF evidenced by ferritin <100 and Tsat <20%.  - No diuretic as patient appears euvolemic - Currently taking lisinopril 20 mg daily - SBP is above goal of 150s  - Continue hydralazine 100 mg q8h - Eventually add SGLT2i to optimize GDMT - no h/o UTI, CBGs <180 despite A1c of 9.9% (improved from 13.3% in 11/2019) - Ideally start spironolactone before discharge.     Plan: 1) Medication changes recommended at this time: - Hold off on IV iron until imaging plans solidified as IV Feraheme can interfere with results for up to 3 months - Stop lisinopril 20 mg daily and switch to losartan 50 mg daily.  Eventually switch to [facility] after 36 hour lisinopril washout to optimize HFpEF GDMT.  This will also help him stay off clonidine and beta-blocker which precipitated his bradycardia on admission.   2) Patient assistance: - none pending  3)  Education  - To be completed prior to discharge  Laurey Arrow, PharmD PGY1 Pharmacy Resident 09/17/2021  7:36 AM

## 2021-09-18 DIAGNOSIS — R001 Bradycardia, unspecified: Secondary | ICD-10-CM | POA: Diagnosis not present

## 2021-09-18 LAB — MAGNESIUM: Magnesium: 1.7 mg/dL (ref 1.7–2.4)

## 2021-09-18 LAB — BASIC METABOLIC PANEL
Anion gap: 5 (ref 5–15)
BUN: 10 mg/dL (ref 8–23)
CO2: 19 mmol/L — ABNORMAL LOW (ref 22–32)
Calcium: 7.6 mg/dL — ABNORMAL LOW (ref 8.9–10.3)
Chloride: 115 mmol/L — ABNORMAL HIGH (ref 98–111)
Creatinine, Ser: 0.82 mg/dL (ref 0.61–1.24)
GFR, Estimated: >60 mL/min (ref >60)
Glucose, Bld: 106 mg/dL — ABNORMAL HIGH (ref 70–99)
Potassium: 3.9 mmol/L (ref 3.5–5.1)
Sodium: 139 mmol/L (ref 135–145)

## 2021-09-18 LAB — GLUCOSE, CAPILLARY
Glucose-Capillary: 91 mg/dL (ref 70–99)
Glucose-Capillary: 183 mg/dL — ABNORMAL HIGH (ref 70–99)

## 2021-09-18 MED ORDER — MAGNESIUM SULFATE 2 GM/50ML IV SOLN
2.0000 g | Freq: Once | INTRAVENOUS | Status: AC
  Administered 2021-09-18: 2 g via INTRAVENOUS
  Filled 2021-09-18: qty 50

## 2021-09-18 MED ORDER — FERROUS SULFATE 325 (65 FE) MG PO TABS: 325.0000 mg | ORAL_TABLET | Freq: Two times a day (BID) | ORAL | 3 refills | Status: DC

## 2021-09-18 MED ORDER — HYDROCHLOROTHIAZIDE 25 MG PO TABS: 25.0000 mg | ORAL_TABLET | Freq: Every day | ORAL | 0 refills | Status: DC

## 2021-09-18 NOTE — Discharge Summary (Signed)
Physician Discharge Summary  Treshaun Carrico VWP:[id number] DOB: Sep 19, 1945 DOA: 09/15/2021  PCP: Lorene Dy, MD  Admit date: 09/15/2021 Discharge date: 09/18/2021  Time spent: 37 minutes  Recommendations for Outpatient Follow-up:  Will need close interval follow-up with Dr. Meridee Score of cardiology who I will CC on this note--in particular patient will need attention to reinitiation of beta-blocker in the setting of relatively profound bradycardia on admission--- also will need determination of whether he is a TAVR candidate on that follow-up visit--- visit has already been scheduled 12/18/2021 at cardiology office-skilled facility to transport the patient there for this visit--- will ask that loop recorder be placed by cardiology prior to discharge as this was never initiated in the outpatient setting apparently Recommend Chem-12 CBC in about 1 week Please consider work-up of fatty liver in the outpatient setting Please consider MMSE/slums/MoCA testing given his mild cognitive issues from prior stroke symptoms Patient may require work-up for nongap acidosis (likely secondary to an RTA versus surreptitious losses of bicarb/LAxative] Rpt Iron studies in 3 weeks--consider colonoscopy as OP?  Discharge Diagnoses:  MAIN problem for hospitalization   Bradycardia profound secondary to beta-blocker  Please see below for itemized issues addressed in HOpsital- refer to other progress notes for clarity if needed  Discharge Condition: Fair  Diet recommendation: Diabetic heart healthy  Filed Weights   09/16/21 0311 09/17/21 0440 09/18/21 0605  Weight: 96.3 kg 98.7 kg 100.1 kg    History of present illness:  76 year old skilled nursing facility dwelling black male HFrEF-last echocardiogram = EF 45-50%, akinesis L basal and mid lateral wall + grade 2 DD Subarachnoid hemorrhage 11/2019 with recent R PCA infarct 05/02/2021 (discharge to CIR)--- patient declined loop recorder --on 5/8,  ?pick up a 30-day event monitor subsequently---- because his blood pressure was uncontrolled--- on that admission he was started and initiated on higher doses of hydralazine HTN IDDM Prior heavy EtOH use, gout, arthritis, HLD   Present from SNF 5/23 c 3/7 h/o diarrhea-reddish tint to sheet when passing stools While being triaged heart rate dropped from 60 with first-degree block to possible type II Mobitz block in the 36s Cardiology was consulted by EDP who recommended cessation of all beta-blockade No further dark stools noted in the ED   Patient admitted to progressive care for aggressive heart rate monitoring-potassium replaced magnesium checked troponin checked-bolus IV fluid 500 cc given, saline rate 100 cc/H given 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 hydralazine to 1100 qid-- increase clonidine back to 0.2 x 3 times daily Goal blood pressure about 150 eventually and may need to be titrated in the outpatient setting-I discussed this with Dr. 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 He will need to be followed in the outpatient setting for further work-up but this should not preclude his discharge ?  GI bleed on admission which was unfounded-patient has been having normal stools during hospital stay Anemia of iron deficiency with iron level 36 saturations 13 Start p.o. FeSO4 twice daily Check iron studies in about 3 weeks If he continues to be slightly anemic (hemoglobin has been stable in the 9-10 range throughout hospital stay without evidence of bleeding) then a colonoscopy may be considered per primary care Gout--quiescent Mildly elevated alk phos-etiology    Discharge Exam: Vitals:   09/18/21 0834 09/18/21 1029  BP: (!) 161/59 (!) 180/83  Pulse: 95 93  Resp: 17 15  Temp:  97.8 F (36.6 C)  SpO2: 98% 97%    Subj on day of d/c   Awake slight incoherency no distress 9 bts Vtach noted--we replaced the Magnesium IV today --no further arrythmia Loud HSM  Abd soft nt  no reboudn no gaurding No LE edema  General Exam on discharge  As above  Discharge Instructions   Discharge Instructions     Diet - low sodium heart healthy   Complete by: As directed    Increase activity slowly   Complete by: As directed       Allergies as of 09/18/2021   No Known Allergies      Medication List     STOP taking these medications    allopurinol 300 MG tablet Commonly known as: ZYLOPRIM   Biofreeze 4 % Gel Generic drug: Menthol (Topical Analgesic)    chlorthalidone 25 MG tablet Commonly known as: HYGROTON   metoprolol succinate 25 MG 24 hr tablet Commonly known as: TOPROL-XL       TAKE these medications    acetaminophen 325 MG tablet Commonly known as: TYLENOL Take 2 tablets (650 mg total) by mouth every 4 (four) hours as needed for mild pain (or temp > 37.5 C (99.5 F)).   atorvastatin 80 MG tablet Commonly known as: LIPITOR Take 1 tablet (80 mg total) by mouth daily.   cloNIDine 0.2 MG tablet Commonly known as: CATAPRES Take 1 tablet (0.2 mg total) by mouth 3 (three) times daily.   clopidogrel 75 MG tablet Commonly known as: PLAVIX Take 1 tablet (75 mg total) by mouth daily.   ferrous sulfate 325 (65 FE) MG tablet Take 1 tablet (325 mg total) by mouth 2 (two) times daily with a meal.   hydrALAZINE 100 MG tablet Commonly known as: APRESOLINE Take 1 tablet (100 mg total) by mouth 4 (four) times daily.   hydrochlorothiazide 25 MG tablet Commonly known as: HYDRODIURIL Take 1 tablet (25 mg total) by mouth daily.   insulin detemir 100 UNIT/ML injection Commonly known as: LEVEMIR Inject 0.12 mLs (12 Units total) into the skin daily. What changed:  how much to take when to take this additional instructions   lisinopril 40 MG tablet Commonly known as: ZESTRIL Take 1 tablet (40 mg total) by mouth daily.   multivitamin with minerals Tabs tablet Take 1 tablet by mouth  daily.   ondansetron 4 MG tablet Commonly known as: ZOFRAN Take 4 mg by mouth every 8 (eight) hours as needed.       No Known Allergies  Follow-up Information     Early Osmond, MD Follow up on 12/18/2021.   Specialty: Cardiology Why: at 1:20pm. Please arrive by 1:05pm. Thank you Contact information: [place] [address] [phone]         [place]. Go in 6 day(s).   Specialty: Cardiology Why: Hospital follow up PLEASE bring a list of medications to  appointment FREE valet parking , Entrance C, off of [address] information: [address] [id number] [place] [phone]                 The results of significant diagnostics from this hospitalization (including imaging, microbiology, ancillary and laboratory) are listed below for reference.    Significant Diagnostic Studies: DG Chest 1 View  Result Date: 09/15/2021 CLINICAL DATA:  CHF, bradycardia, weakness and diarrhea. EXAM: CHEST  1 VIEW COMPARISON:  Chest radiograph May 05, 2021 FINDINGS: Cardiomegaly and central vascular prominence. Aortic atherosclerosis. No overt pulmonary edema or focal airspace consolidation. No visible pleural effusion or pneumothorax. No acute osseous abnormality. IMPRESSION: Cardiomegaly and central vascular prominence. No overt pulmonary edema or focal airspace consolidation. Electronically Signed   By: Dahlia Bailiff M.D.   On: 09/15/2021 18:19   ECHOCARDIOGRAM COMPLETE  Result Date: 09/16/2021    ECHOCARDIOGRAM REPORT   Patient Name:   LYNDELL ALLAIRE Date of Exam: 09/16/2021 Medical Rec #:  [id number]             Height:       66.0 in Accession #:    [id number]            Weight:       212.3 lb Date of Birth:  05-11-1945             BSA:          2.051 m Patient Age:    55 years              BP:           166/79 mmHg Patient Gender: M                     HR:           82 bpm. Exam Location:  Inpatient Procedure: 2D Echo, Cardiac Doppler, Color Doppler and Strain Analysis Indications:    Bradycardia; murmur  History:        Patient has prior history of Echocardiogram examinations, most                 recent 05/05/2021. Stroke, Signs/Symptoms:Murmur; Risk                 Factors:Diabetes and Hypertension.  Sonographer:    [place] Referring Phys: [id number] [employer]  1. Left ventricular ejection fraction, by estimation, is 55 to 60%. The left ventricle has normal function. The  left ventricle has no regional wall motion abnormalities. There is mild left ventricular hypertrophy. Left ventricular diastolic parameters are consistent with Grade II diastolic dysfunction (pseudonormalization).  2. Right ventricular systolic function is normal. The right ventricular size is normal. There is normal pulmonary artery systolic pressure. The estimated right ventricular systolic pressure is 19.3 mmHg.  3. Left  atrial size was mildly dilated.  4. The mitral valve is normal in structure. No evidence of mitral valve regurgitation. No evidence of mitral stenosis.  5. The aortic valve is tricuspid. There is severe calcifcation of the aortic valve. Aortic valve regurgitation is not visualized. Severe aortic valve stenosis. Aortic valve mean gradient measures 39.0 mmHg, AVA 0.88 cm^2.  6. The inferior vena cava is normal in size with <50% respiratory variability, suggesting right atrial pressure of 8 mmHg. FINDINGS  Left Ventricle: Left ventricular ejection fraction, by estimation, is 55 to 60%. The left ventricle has normal function. The left ventricle has no regional wall motion abnormalities. The left ventricular internal cavity size was normal in size. There is  mild left ventricular hypertrophy. Left ventricular diastolic parameters are consistent with Grade II diastolic dysfunction (pseudonormalization). Right Ventricle: The right ventricular size is normal. No increase in right ventricular wall thickness. Right ventricular systolic function is normal. There is normal pulmonary artery systolic pressure. The tricuspid regurgitant velocity is 2.56 m/s, and  with an assumed right atrial pressure of 8 mmHg, the estimated right ventricular systolic pressure is 60.1 mmHg. Left Atrium: Left atrial size was mildly dilated. Right Atrium: Right atrial size was normal in size. Pericardium: There is no evidence of pericardial effusion. Mitral Valve: The mitral valve is normal in structure. No evidence of mitral  valve regurgitation. No evidence of mitral valve stenosis. MV peak gradient, 11.4 mmHg. The mean mitral valve gradient is 5.0 mmHg. Tricuspid Valve: The tricuspid valve is normal in structure. Tricuspid valve regurgitation is trivial. Aortic Valve: The aortic valve is tricuspid. There is severe calcifcation of the aortic valve. Aortic valve regurgitation is not visualized. Severe aortic stenosis is present. Aortic valve mean gradient measures 39.0 mmHg. Aortic valve peak gradient measures 47.4 mmHg. Aortic valve area, by VTI measures 1.31 cm. Pulmonic Valve: The pulmonic valve was normal in structure. Pulmonic valve regurgitation is not visualized. Aorta: The aortic root is normal in size and structure. Venous: The inferior vena cava is normal in size with less than 50% respiratory variability, suggesting right atrial pressure of 8 mmHg. IAS/Shunts: No atrial level shunt detected by color flow Doppler.  LEFT VENTRICLE PLAX 2D LVIDd:         5.00 cm     Diastology LVIDs:         4.10 cm     LV e' medial:    6.31 cm/s LV PW:         1.10 cm     LV E/e' medial:  20.9 LV IVS:        1.40 cm     LV e' lateral:   5.68 cm/s LVOT diam:     2.10 cm     LV E/e' lateral: 23.2 LV SV:         86 LV SV Index:   42 LVOT Area:     3.46 cm  LV Volumes (MOD) LV vol d, MOD A2C: 98.5 ml LV vol d, MOD A4C: 87.8 ml LV vol s, MOD A2C: 29.1 ml LV vol s, MOD A4C: 47.7 ml LV SV MOD A2C:     69.4 ml LV SV MOD A4C:     87.8 ml LV SV MOD BP:      57.5 ml RIGHT VENTRICLE RV Basal diam:  3.60 cm RV Mid diam:    2.30 cm RV S prime:     11.50 cm/s TAPSE (M-mode): 1.9 cm LEFT ATRIUM  Index        RIGHT ATRIUM           Index LA diam:        4.10 cm 2.00 cm/m   RA Area:     17.50 cm LA Vol (A2C):   63.5 ml 30.96 ml/m  RA Volume:   52.00 ml  25.35 ml/m LA Vol (A4C):   62.8 ml 30.62 ml/m LA Biplane Vol: 67.0 ml 32.66 ml/m  AORTIC VALVE                     PULMONIC VALVE AV Area (Vmax):    1.22 cm      PV Vmax:       1.16 m/s AV Area  (Vmean):   1.22 cm      PV Vmean:      79.250 cm/s AV Area (VTI):     1.31 cm      PV VTI:        0.216 m AV Vmax:           344.20 cm/s   PV Peak grad:  5.3 mmHg AV Vmean:          229.800 cm/s  PV Mean grad:  3.0 mmHg AV VTI:            0.658 m AV Peak Grad:      47.4 mmHg AV Mean Grad:      39.0 mmHg LVOT Vmax:         121.00 cm/s LVOT Vmean:        80.700 cm/s LVOT VTI:          0.248 m LVOT/AV VTI ratio: 0.38  AORTA Ao Root diam: 3.10 cm Ao Asc diam:  3.10 cm MITRAL VALVE                TRICUSPID VALVE MV Area (PHT): 4.60 cm     TR Peak grad:   26.2 mmHg MV Area VTI:   2.10 cm     TR Vmax:        256.00 cm/s MV Peak grad:  11.4 mmHg MV Mean grad:  5.0 mmHg     SHUNTS MV Vmax:       1.69 m/s     Systemic VTI:  0.25 m MV Vmean:      106.0 cm/s   Systemic Diam: 2.10 cm MV Decel Time: 165 msec MV E velocity: 132.00 cm/s MV A velocity: 126.00 cm/s MV E/A ratio:  1.05 Dalton McleanMD Electronically signed by Franki Monte Signature Date/Time: 09/16/2021/5:06:36 PM    Final     Microbiology: Recent Results (from the past 240 hour(s))  MRSA Next Gen by PCR, Nasal     Status: None   Collection Time: 09/15/21  6:06 PM   Specimen: Nasal Mucosa; Nasal Swab  Result Value Ref Range Status   MRSA by PCR Next Gen NOT DETECTED NOT DETECTED Final    Comment: (NOTE) The GeneXpert MRSA Assay (FDA approved for NASAL specimens only), is one component of a comprehensive MRSA colonization surveillance program. It is not intended to diagnose MRSA infection nor to guide or monitor treatment for MRSA infections. Test performance is not FDA approved in patients less than 43 years old. Performed at [facility], [place] [address], [place], [place] [address]      Labs: Basic Metabolic Panel: Recent Labs  Lab 09/15/21 1204 09/15/21 2221 09/17/21 0923 09/18/21 0047  NA 135 136 138 139  K 3.7 4.2 3.8 3.9  CL 108 112* 113* 115*  CO2 20* 20* 17* 19*  GLUCOSE 119* 113* 166* 106*  BUN 24* 24* 11 10   CREATININE 1.16 1.21 0.76 0.82  CALCIUM 8.5* 7.8* 8.0* 7.6*  MG 2.0  --  1.7 1.7   Liver Function Tests: Recent Labs  Lab 09/15/21 1204 09/17/21 0923  AST 30 25  ALT 24 19  ALKPHOS 132* 127*  BILITOT 0.7 1.0  PROT 6.8 6.5  ALBUMIN 2.7* 2.7*   No results for input(s): LIPASE, AMYLASE in the last 168 hours. No results for input(s): AMMONIA in the last 168 hours. CBC: Recent Labs  Lab 09/15/21 1204  WBC 7.1  NEUTROABS 5.1  HGB 9.4*  HCT 29.1*  MCV 90.4  PLT 266   Cardiac Enzymes: No results for input(s): CKTOTAL, CKMB, CKMBINDEX, TROPONINI in the last 168 hours. BNP: BNP (last 3 results) No results for input(s): BNP in the last 8760 hours.  ProBNP (last 3 results) No results for input(s): PROBNP in the last 8760 hours.  CBG: Recent Labs  Lab 09/17/21 1149 09/17/21 1619 09/17/21 2114 09/18/21 0606 09/18/21 1052  GLUCAP 137* 168* 108* 91 183*       Signed:  Nita Sells MD   [facility] 09/18/2021, 10:55 AM

## 2021-09-18 NOTE — Progress Notes (Signed)
Patient via [facility] to [facility] in stable condition. Report called to Engineering geologist at [facility]. Discharge instructions in discharge envelope.

## 2021-09-18 NOTE — Plan of Care (Signed)
  Problem: Education: Goal: Knowledge of General Education information will improve Description: Including pain rating scale, medication(s)/side effects and non-pharmacologic comfort measures Outcome: Adequate for Discharge   Problem: Health Behavior/Discharge Planning: Goal: Ability to manage health-related needs will improve Outcome: Adequate for Discharge   Problem: Clinical Measurements: Goal: Ability to maintain clinical measurements within normal limits will improve Outcome: Adequate for Discharge Goal: Will remain free from infection Outcome: Adequate for Discharge Goal: Diagnostic test results will improve Outcome: Adequate for Discharge Goal: Respiratory complications will improve Outcome: Adequate for Discharge Goal: Cardiovascular complication will be avoided Outcome: Adequate for Discharge   Problem: Activity: Goal: Risk for activity intolerance will decrease Outcome: Adequate for Discharge   Problem: Nutrition: Goal: Adequate nutrition will be maintained Outcome: Adequate for Discharge   Problem: Coping: Goal: Level of anxiety will decrease Outcome: Adequate for Discharge   Problem: Elimination: Goal: Will not experience complications related to bowel motility Outcome: Adequate for Discharge Goal: Will not experience complications related to urinary retention Outcome: Adequate for Discharge   Problem: Pain Managment: Goal: General experience of comfort will improve Outcome: Adequate for Discharge   Problem: Safety: Goal: Ability to remain free from injury will improve Outcome: Adequate for Discharge   Problem: Skin Integrity: Goal: Risk for impaired skin integrity will decrease Outcome: Adequate for Discharge   Problem: Acute Rehab PT Goals(only PT should resolve) Goal: Pt Will Go Supine/Side To Sit Outcome: Adequate for Discharge Goal: Pt Will Go Sit To Supine/Side Outcome: Adequate for Discharge Goal: Patient Will Transfer Sit To/From  Stand Outcome: Adequate for Discharge Goal: Pt Will Ambulate Outcome: Adequate for Discharge   

## 2021-09-18 NOTE — TOC Transition Note (Signed)
Transition of Care [facility]) - CM/SW Discharge Note   Patient Details  Name: Manuel Chapman MRN: [id number] Date of Birth: 10-22-1945  [facility]) CM/SW Contact:  Tresa Endo Phone Number: 09/18/2021, 11:22 AM   Clinical Narrative:    Patient will DC to: [facility] Anticipated DC date: 09/18/2021 Family notified: Pt Daughter Transport by: Corey Harold   Per MD patient ready for DC to [facility]. RN to call report prior to discharge [phone]). RN, patient, patient's family, and facility notified of DC. Discharge Summary and FL2 sent to facility. DC packet on chart. Ambulance transport requested for patient.   CSW will sign off for now as social work intervention is no longer needed. Please consult [place] again if new needs arise.           Patient Goals and CMS Choice        Discharge Placement                       Discharge Plan and Services                                     Social Determinants of Health (SDOH) Interventions Food Insecurity Interventions: Intervention Not Indicated Financial Strain Interventions: Intervention Not Indicated Housing Interventions: Intervention Not Indicated Transportation Interventions: Intervention Not Indicated   Readmission Risk Interventions     View : No data to display.

## 2021-09-18 NOTE — Progress Notes (Signed)
Patient sleeping and E Link called to report patient had 9 beats of V Tach. See strip. Patient remained asleep and V/S stable. Will continue to monitor closely.

## 2021-09-18 NOTE — Care Management Important Message (Signed)
Important Message  Patient Details  Name: Manuel Chapman MRN: [id number] Date of Birth: 1945-11-29   Medicare Important Message Given:  Yes   Patient left prior to IM delivery will mail to the patient home address.   Kristjan Derner 09/18/2021, 4:11 PM

## 2021-09-18 NOTE — Progress Notes (Signed)
Heart Failure Stewardship Pharmacist Progress Note   PCP: Burton Apleyoberts, Ronald, MD PCP-Cardiologist: None    HPI: 76 yo male with PMH of HTN, IDDM, CHF, CVA in Jan 2023, ICH left subcortical and right temporal embolic infarct in 11/2019.  Hospitalized 01/14 through 03/08 for cerebral infarction with notable asymptomatic bradycardia in 50-60s.    [facility] ED [facility] from [facility] with chief complaint of diarrhea and fatigue, found to have significant bradycardia with HR in 40s.  PTA clonidine 0.2 mg TID and Toprol XL 25 mg daily stopped, PTA lisinopril dose cut in half to 20 mg daily and chlorthalidone held.  CXR in the ED showed Cardiomegaly and central vascular prominence with no overt pulmonary edema.  Echo 05/23 demonstrated LVEF of 55-60% (improved from 45-50% in 04/2021), normal RV which improved from mildly reduced RV function in Jan 2023 with G2DD and severe AV stenosis since 04/2021.  Cardiology was consulted and patient was deemed a marginal candidate for AV intervention in the absence of symptoms.  Clonidine 0.1 mg TID added back 05/24 with SBP in the 180-90s and subsequently increased to home dose of 0.2 mg TID last night; hydralazine also increased to 150 mg q8h and lisinopril increased to 40 mg daily 05/25 PM.    Current HF Medications: ACE/ARB/ARNI: lisinopril 40 mg daily Other: hydralazine 150 mg q8h  Prior to admission HF Medications: Beta blocker: metoprolol succinate 25 mg daily ACE/ARB/ARNI: lisinopril 40 mg daily Other: hydralazine 100 mg QID  Pertinent Lab Values: Serum creatinine 0.83, BUN 10, Potassium 3.9 , Sodium 139, Magnesium 1.7, A1c 9.9% 09/15/2021 Iron Studies: Iron 36, Ferritin 71, Tsat 13%  Vital Signs: Weight: trending up - 212 > 217 > 220 lbs (admission weight: 212 lbs) Blood pressure: labile - 160-90/80-90s (goal BP 150) Heart rate: 80-90s I/O: -1.5 L yesterday; net + 3.15 L   Medication Assistance / Insurance Benefits Check: Does the patient  have prescription insurance?  Yes Type of insurance plan: [employer]  Outpatient Pharmacy:  Prior to admission outpatient pharmacy: [facility] Is the patient willing to use [facility] pharmacy at discharge? Yes Is the patient willing to transition their outpatient pharmacy to [facility]?   No    Assessment: 1. 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 NYHA class II symptoms. - New iron deficient anemia in setting of CHF evidenced by ferritin <100 and Tsat <20%.  - No diuretic as patient appears euvolemic - Currently taking lisinopril 40 mg daily - SBP is above goal of 150s  - Continue hydralazine 150 mg q8h - Eventually add SGLT2i to optimize GDMT - no h/o UTI, CBGs <180 despite A1c of 9.9% (improved from 13.3% in 11/2019) - Ideally start spironolactone before discharge.     Plan: 1) Medication changes recommended at this time: - Give IV Mg 2g x1 to maintain Mg > 2 - Hold off on IV iron until imaging plans solidified as IV Feraheme can interfere with results for up to 3 months - STOP lisinopril 40 mg daily and SWITCH to losartan 50 mg daily.  Eventually switch to [facility] after 36 hour lisinopril washout to optimize HFpEF GDMT.  This will also help him stay off clonidine and beta-blocker which precipitated his bradycardia on admission.   2) Patient assistance: - none pending  3)  Education  - Patient has been educated on current HF medications and potential additions to HF medication regimen - Patient  verbalizes understanding that over the next few months, these medication doses may change and more medications may be added to optimize HF regimen - Patient has been educated on basic disease state pathophysiology and goals of therapy   Laurey Arrow, PharmD PGY1 Pharmacy Resident 09/18/2021  7:30 AM

## 2021-09-23 ENCOUNTER — Ambulatory Visit: Payer: Medicare HMO | Admitting: Cardiology

## 2021-09-24 ENCOUNTER — Ambulatory Visit (HOSPITAL_COMMUNITY)
Admit: 2021-09-24 | Discharge: 2021-09-24 | Disposition: A | Discharge status: Home / Self Care | Payer: Medicare HMO | Attending: Cardiology | Admitting: Cardiology

## 2021-09-24 ENCOUNTER — Encounter (HOSPITAL_COMMUNITY): Payer: Self-pay

## 2021-09-24 ENCOUNTER — Telehealth (HOSPITAL_COMMUNITY): Payer: Self-pay | Admitting: *Deleted

## 2021-09-24 VITALS — BP 120/68 | HR 66 | Wt 272.4 lb

## 2021-09-24 DIAGNOSIS — E119 Type 2 diabetes mellitus without complications: Secondary | ICD-10-CM | POA: Diagnosis not present

## 2021-09-24 DIAGNOSIS — I5032 Chronic diastolic (congestive) heart failure: Secondary | ICD-10-CM | POA: Insufficient documentation

## 2021-09-24 DIAGNOSIS — Z7902 Long term (current) use of antithrombotics/antiplatelets: Secondary | ICD-10-CM | POA: Insufficient documentation

## 2021-09-24 DIAGNOSIS — I110 Hypertensive heart disease with heart failure: Secondary | ICD-10-CM | POA: Diagnosis not present

## 2021-09-24 DIAGNOSIS — I350 Nonrheumatic aortic (valve) stenosis: Secondary | ICD-10-CM | POA: Diagnosis not present

## 2021-09-24 DIAGNOSIS — Z79899 Other long term (current) drug therapy: Secondary | ICD-10-CM | POA: Diagnosis not present

## 2021-09-24 DIAGNOSIS — I5042 Chronic combined systolic (congestive) and diastolic (congestive) heart failure: Secondary | ICD-10-CM | POA: Insufficient documentation

## 2021-09-24 DIAGNOSIS — Z8673 Personal history of transient ischemic attack (TIA), and cerebral infarction without residual deficits: Secondary | ICD-10-CM | POA: Diagnosis not present

## 2021-09-24 LAB — BASIC METABOLIC PANEL
Anion gap: 6 (ref 5–15)
BUN: 13 mg/dL (ref 8–23)
CO2: 23 mmol/L (ref 22–32)
Calcium: 8 mg/dL — ABNORMAL LOW (ref 8.9–10.3)
Chloride: 111 mmol/L (ref 98–111)
Creatinine, Ser: 0.93 mg/dL (ref 0.61–1.24)
GFR, Estimated: >60 mL/min (ref >60)
Glucose, Bld: 196 mg/dL — ABNORMAL HIGH (ref 70–99)
Potassium: 3.8 mmol/L (ref 3.5–5.1)
Sodium: 140 mmol/L (ref 135–145)

## 2021-09-24 LAB — BRAIN NATRIURETIC PEPTIDE: B Natriuretic Peptide: 405.3 pg/mL — ABNORMAL HIGH (ref 0.0–100.0)

## 2021-09-24 NOTE — Telephone Encounter (Signed)
Heart Failure Nurse Navigator Progress Note   Unable to leave appointment reminder for 2 pm on 09/24/21 with patient.   Rhae Hammock, BSN, Scientist, clinical (histocompatibility and immunogenetics) Only

## 2021-09-24 NOTE — Patient Instructions (Signed)
There has been no changes to your medications.  Labs done today, your results will be available in MyChart, we will contact you for abnormal readings.   You have been ordered a PYP Scan.  This is done in the Radiology Department of [facility].  When you come for this test please plan to be there 2-3 hours.  ONCE APPROVED BY INSURANCE YOU WILL BE CALLED TO SCHEDULE THE TEST.   Your physician recommends that you schedule a follow-up appointment in: 5 weeks.  If you have any questions or concerns before your next appointment please send [place] a message through [place] or call our office at [phone].    TO LEAVE A MESSAGE FOR THE NURSE SELECT OPTION 2, PLEASE LEAVE A MESSAGE INCLUDING: YOUR NAME DATE OF BIRTH CALL BACK NUMBER REASON FOR CALL**this is important as we prioritize the call backs  YOU WILL RECEIVE A CALL BACK THE SAME DAY AS LONG AS YOU CALL BEFORE 4:00 PM  At the [facility], you and your health needs are our priority. As part of our continuing mission to provide you with exceptional heart care, we have created designated Provider Care Teams. These Care Teams include your primary Cardiologist (physician) and Advanced Practice Providers (APPs- Physician Assistants and Nurse Practitioners) who all work together to provide you with the care you need, when you need it.   You may see any of the following providers on your designated Care Team at your next follow up: Dr Arvilla Meres Dr Carron Curie, NP Robbie Lis, [place] [facility] [place], [place] Karle Plumber, PharmD   Please be sure to bring in all your medications bottles to every appointment.

## 2021-09-24 NOTE — Progress Notes (Addendum)
HEART & VASCULAR TRANSITION OF CARE CONSULT NOTE     Referring Physician: Dr. Verlon Au  Primary Care: Lorene Dy, MD Primary Cardiologist: Dr. Ali Lowe   HPI: Referred to clinic by Dr. Verlon Au for heart failure consultation.   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 Resides at [facility].   [facility] for diarrhea and hematochezia. While being triaged heart rate dropped from 60 with first-degree block to possible type II Mobitz block in the 30s. Cardiology consulted and recommended admit for monitoring and K replacement. AV nodal blocking agents were discontinued. HS trop 24>>69. TSH normal. No further diarrhea .  Bradycardia improved off AV nodal blocking agents. As part of w/u, an echocardiogram was obtained showing mildly reduced LVEF, 45-50%, GIIDD, mildly reduced RV and severe thickening of the aortic valve w/ severe aortic stenosis, with peak gradient of 65.29mHg and mean transvalvular gradient of 30.951mg. The patient's dimensionless index is 0.21 and calculated aortic valve area is 0.58cm. Structural heart team was consulted. Seen by Dr. ThAli LoweBeing considered for possible TAVR. Planning further outpatient w/u. Outpatient monitor arranged to assess for further bradycardia. [facility]   [facility] today w[facility]. Denies resting dyspnea. Not noticeably short of breath w/ his level of activity. Denies orthopnea/PND. Has trace bilateral LEE on exam. Reports monitor was mailed to SNF but not currently wearing. Was told by SNF staff that they needed further instruction on how to place by cardiology. Pulse rate today 66 bpm Denies  syncope/ near syncope. BP 120/68. ReDs 39%     Cardiac Testing   Echocardiogram 05/05/2021:   1. Left ventricular ejection fraction, by estimation, is 45 to 50%. The  left ventricle has mildly decreased function. The left ventricle  demonstrates regional wall motion abnormalities (see scoring  diagram/findings for description). There is moderate  concentric left ventricular hypertrophy. Left ventricular diastolic  parameters are consistent with Grade II diastolic dysfunction  (pseudonormalization). Elevated left atrial pressure. There is akinesis of  the left ventricular, basal-mid inferolateral  wall.   2. Right ventricular systolic function is mildly reduced. The right  ventricular size is mildly enlarged. Tricuspid regurgitation signal is  inadequate for assessing PA pressure.   3. Left atrial size was mildly dilated.   4. Right atrial size was mildly dilated.   5. The mitral valve is normal in structure. Mild mitral valve  regurgitation.   6. The aortic valve is tricuspid. There is severe calcifcation of the  aortic valve. There is severe thickening of the aortic valve. Aortic valve  regurgitation is trivial. Severe aortic valve stenosis. Aortic valve mean  gradient measures 30.9 mmHg.  Aortic valve Vmax measures 4.04 m/s.    Comparison(s): Prior images reviewed side by side. The left ventricular  function is worsened. The right ventricular systolic function is worse.  Aortic stenosis has worsened and is now severe.    Echocardiogram 11/29/2019:   1. Hypokinesis of the basal inferolateral wall; overall normal LV  function; grade 1 diastolic dysfunction; moderate AS (mean gradient 24  mmHg); mild LAE.   2. Left ventricular ejection fraction, by estimation, is 55 to 60%. The  left ventricle  has normal function. The left ventricle demonstrates  regional wall motion abnormalities (see scoring diagram/findings for  description). Left ventricular diastolic  parameters are  consistent with Grade I diastolic dysfunction (impaired  relaxation).   3. Right ventricular systolic function is normal. The right ventricular  size is normal.   4. Left atrial size was mildly dilated.   5. The mitral valve is normal in structure. Trivial mitral valve  regurgitation. No evidence of mitral stenosis.   6. The aortic valve has an indeterminant number of cusps. Aortic valve  regurgitation is not visualized. Moderate aortic valve stenosis.   Review of Systems: [y] = yes, '[ ]'  = no   General: Weight gain '[ ]' ; Weight loss '[ ]' ; Anorexia '[ ]' ; Fatigue '[ ]' ; Fever '[ ]' ; Chills '[ ]' ; Weakness '[ ]'   Cardiac: Chest pain/pressure '[ ]' ; Resting SOB '[ ]' ; Exertional SOB '[ ]' ; Orthopnea '[ ]' ; Pedal Edema '[ ]' ; Palpitations '[ ]' ; Syncope '[ ]' ; Presyncope '[ ]' ; Paroxysmal nocturnal dyspnea'[ ]'   Pulmonary: Cough '[ ]' ; Wheezing'[ ]' ; Hemoptysis'[ ]' ; Sputum '[ ]' ; Snoring '[ ]'   GI: Vomiting'[ ]' ; Dysphagia'[ ]' ; Melena'[ ]' ; Hematochezia '[ ]' ; Heartburn'[ ]' ; Abdominal pain '[ ]' ; Constipation '[ ]' ; Diarrhea '[ ]' ; BRBPR '[ ]'   GU: Hematuria'[ ]' ; Dysuria '[ ]' ; Nocturia'[ ]'   Vascular: Pain in legs with walking '[ ]' ; Pain in feet with lying flat '[ ]' ; Non-healing sores '[ ]' ; Stroke '[ ]' ; TIA '[ ]' ; Slurred speech '[ ]' ;  Neuro: Headaches'[ ]' ; Vertigo'[ ]' ; Seizures'[ ]' ; Paresthesias'[ ]' ;Blurred vision '[ ]' ; Diplopia '[ ]' ; Vision changes '[ ]'   Ortho/Skin: Arthritis '[ ]' ; Joint pain '[ ]' ; Muscle pain '[ ]' ; Joint swelling '[ ]' ; Back Pain '[ ]' ; Rash '[ ]'   Psych: Depression'[ ]' ; Anxiety'[ ]'   Heme: Bleeding problems '[ ]' ; Clotting disorders '[ ]' ; Anemia '[ ]'   Endocrine: Diabetes '[ ]' ; Thyroid dysfunction'[ ]'    Past Medical History:  Diagnosis Date   Aortic stenosis 09/16/2021   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     allopurinol (ZYLOPRIM) 300 MG tablet Take 300 mg by mouth daily.     atorvastatin (LIPITOR) 80 MG tablet Take 1 tablet (80 mg total) by mouth daily.     cloNIDine (CATAPRES) 0.2 MG tablet Take 1 tablet (0.2 mg total) by mouth 3 (three) times daily.     clopidogrel (PLAVIX) 75 MG tablet Take 1 tablet (75 mg total) by mouth daily.     ferrous sulfate 325 (65 FE) MG tablet Take 1 tablet (325 mg total) by mouth 2 (two) times daily with a meal.  3   hydrALAZINE (APRESOLINE) 100 MG tablet Take 1 tablet (100 mg total) by mouth 4 (four) times daily.     hydrochlorothiazide (HYDRODIURIL) 25 MG tablet Take 1 tablet (25 mg total) by mouth daily. 6 tablet 0   insulin detemir (LEVEMIR) 100 UNIT/ML injection Inject 0.12 mLs (12 Units total) into the skin daily. (Patient taking differently: Inject 10-22 Units into the skin See admin  instructions. Inject 22 units subcutaneous at 0800, then inject 10 units subcutaneous at 2000 per MAR) 10 mL 11   lisinopril (ZESTRIL) 40 MG tablet Take 1 tablet (40 mg total) by mouth daily. 30 tablet 1   Multiple Vitamin (MULTIVITAMIN WITH MINERALS) TABS tablet Take 1 tablet by mouth daily.     ondansetron (ZOFRAN) 4 MG tablet Take 4 mg by mouth every 8 (eight) hours as needed.     No current facility-administered medications for this encounter.    No Known Allergies    Social History   Socioeconomic History   Marital status: Married    Spouse name: Chesley Noon   Number of children: 4   Years of education: Not on file   Highest education level: High school graduate  Occupational History   Occupation: retired  Tobacco Use   Smoking status: Former    Packs/day: 1.00    Years: 10.00    Pack years: 10.00    Types: Cigarettes     Quit date: 1998    Years since quitting: 25.4   Smokeless tobacco: Never  Vaping Use   Vaping Use: Never used  Substance and Sexual Activity   Alcohol use: Not Currently    Comment: not much, h/o heavy beer use   Drug use: No   Sexual activity: Not on file  Other Topics Concern   Not on file  Social History Narrative   Not on file   Social Determinants of Health   Financial Resource Strain: Low Risk    Difficulty of Paying Living Expenses: Not hard at all  Food Insecurity: No Food Insecurity   Worried About Charity fundraiser in the Last Year: Never true   [place] in the Last Year: Never true  Transportation Needs: No Transportation Needs   Lack of Transportation (Medical): No   Lack of Transportation (Non-Medical): No  Physical Activity: Not on file  Stress: Not on file  Social Connections: Not on file  Intimate Partner Violence: Not on file      Family History  Problem Relation Age of Onset   Diabetes Neg Hx    Stroke Neg Hx     Vitals:   09/24/21 1416  BP: 120/68  Pulse: 66  SpO2: 96%  Weight: 123.6 kg    PHYSICAL EXAM: General:  chronically ill appearing AAM in WC. No respiratory difficulty HEENT: normal Neck: supple. no JVD. Carotids 2+ bilat; no bruits. No lymphadenopathy or thryomegaly appreciated. Cor: PMI nondisplaced. Regular rate & rhythm. 3/6 AS murmur  Lungs: clear Abdomen: soft, nontender, nondistended. No hepatosplenomegaly. No bruits or masses. Good bowel sounds. Extremities: no cyanosis, clubbing, rash, trace b/l ankle edema  Neuro: alert & oriented x 3, cranial nerves grossly intact. moves all 4 extremities w/o difficulty. Affect pleasant.  ECG: Not performed    ASSESSMENT & PLAN:  Combined Systolic and Diastolic Heart Failure - Echo 5/23 EF 45-50%, GIIDD, mildly reduced RV - NYHA Class II. Mild fluid overload on exam. ReDs Clip 39% - Stop HCTZ - Start Lasix 20 mg daily + 20 mEq KCL - BMP and BNP today and again in 7 days  (obtain at [facility]) - Will need to avoid hypotension/volume depletion w/ severe AS. Holding addition of SLGT2i and ARNI for now - W/ concomitant conduction disease w/ known AV block and severe AS, ? Amyloid - check Multiple Myeloma Panel and PYP Scan=>[facility] referral if abnormal   2. Severe Aortic Stenosis  - recent echo  showed severe thickening of the aortic valve w/ severe aortic stenosis, with peak gradient of 65.34mHg and mean transvalvular gradient of 30.94mg. The patient's dimensionless index is 0.21 and calculated aortic valve area is 0.58cm. - Denies CP, dyspnea, syncope  - Dr. ThAli Loweollowing for possible TAVR   3. Bradycardia  - type II Mobitz block in the 30s recent hospitalization - improved w/ discontinuation of AV nodal blockers - Plan 30 day outpatient monitor per cardiology to further evaluate - amyloid w/u per above   4. H/o CVA - residual LUE weakness, resides at SNF  - on Plavix and Statin - BP well controlled - he initially deferred loop at time of diagnosis for afib surveillance - plan 30 day monitor per above   NYHA II GDMT  Diuretic- Lasix 20 mg daily  BB- No, bradycardia  Ace/ARB/ARNI lisinopril (holding ARNi, risk for hypotension)  MRA Not yet  SGLT2i No (risk for volume depletion)     Referred to HFSW (PCP, Medications, Transportation, ETOH Abuse, Drug Abuse, Insurance, FiMuseum/gallery curator: No Refer to Pharmacy: No Refer to Home Health:  No Refer to Advanced Heart Failure Clinic: Pending PYP Scan  Refer to General Cardiology: Yes (followed by structural heart team, pending possible TAVR)   [facility]. If normal study, refer back to cardiology.

## 2021-09-25 ENCOUNTER — Telehealth (HOSPITAL_COMMUNITY): Payer: Self-pay | Admitting: Cardiology

## 2021-09-25 MED ORDER — POTASSIUM CHLORIDE CRYS ER 20 MEQ PO TBCR: 20.0000 meq | EXTENDED_RELEASE_TABLET | Freq: Every day | ORAL | 3 refills | Status: AC

## 2021-09-25 MED ORDER — FUROSEMIDE 20 MG PO TABS: 20.0000 mg | ORAL_TABLET | Freq: Every day | ORAL | 3 refills | Status: DC

## 2021-09-25 NOTE — Telephone Encounter (Signed)
Verbal given to Erven Colla with [name] order faxed to [phone]

## 2021-09-25 NOTE — Telephone Encounter (Signed)
-----   Message from Consuelo Pandy, [place] sent at 09/24/2021  4:30 PM EDT ----- BNP (fluid marker) elevated. Contact nursing facility. Stop HCTZ and start Lasix 20 mg daily + 20 meq of KCL daily. Ask SNF to check BMP and BNP in 1 week and fax results to out office

## 2021-09-29 LAB — MULTIPLE MYELOMA PANEL, SERUM
Albumin SerPl Elph-Mcnc: 2.9 g/dL (ref 2.9–4.4)
Albumin/Glob SerPl: 0.9 (ref 0.7–1.7)
Alpha 1: 0.3 g/dL (ref 0.0–0.4)
Alpha2 Glob SerPl Elph-Mcnc: 0.7 g/dL (ref 0.4–1.0)
B-Globulin SerPl Elph-Mcnc: 1 g/dL (ref 0.7–1.3)
Gamma Glob SerPl Elph-Mcnc: 1.3 g/dL (ref 0.4–1.8)
Globulin, Total: 3.3 g/dL (ref 2.2–3.9)
IgA: 401 mg/dL (ref 61–437)
IgG (Immunoglobin G), Serum: 1236 mg/dL (ref 603–1613)
IgM (Immunoglobulin M), Srm: 127 mg/dL (ref 15–143)
Total Protein ELP: 6.2 g/dL (ref 6.0–8.5)

## 2021-10-01 ENCOUNTER — Telehealth (HOSPITAL_COMMUNITY): Payer: Self-pay | Admitting: *Deleted

## 2021-10-06 ENCOUNTER — Telehealth (HOSPITAL_COMMUNITY): Payer: Self-pay | Admitting: Vascular Surgery

## 2021-10-06 NOTE — Telephone Encounter (Signed)
Called pt to give pyp scan appt 6/20 @ [facility] @ 10:45, pt phone gave busy signal, will try back later

## 2021-10-11 ENCOUNTER — Encounter: Payer: Self-pay | Admitting: Adult Health

## 2021-10-11 ENCOUNTER — Ambulatory Visit (INDEPENDENT_AMBULATORY_CARE_PROVIDER_SITE_OTHER): Payer: Medicare HMO

## 2021-10-11 DIAGNOSIS — I639 Cerebral infarction, unspecified: Secondary | ICD-10-CM

## 2021-10-11 DIAGNOSIS — I6389 Other cerebral infarction: Secondary | ICD-10-CM | POA: Diagnosis not present

## 2021-10-11 DIAGNOSIS — I4891 Unspecified atrial fibrillation: Secondary | ICD-10-CM

## 2021-10-13 ENCOUNTER — Encounter (HOSPITAL_COMMUNITY)
Admission: RE | Admit: 2021-10-13 | Discharge: 2021-10-13 | Disposition: A | Discharge status: Home / Self Care | Payer: Medicare HMO | Source: Ambulatory Visit | Attending: Cardiology | Admitting: Cardiology

## 2021-10-13 DIAGNOSIS — I5032 Chronic diastolic (congestive) heart failure: Secondary | ICD-10-CM | POA: Diagnosis present

## 2021-10-13 MED ORDER — TECHNETIUM TC 99M PYROPHOSPHATE
22.0000 | Freq: Once | INTRAVENOUS | Status: AC | PRN
  Administered 2021-10-13: 22 via INTRAVENOUS
  Filled 2021-10-13: qty 22

## 2021-10-14 ENCOUNTER — Telehealth: Payer: Self-pay | Admitting: Cardiology

## 2021-10-14 ENCOUNTER — Telehealth: Payer: Self-pay

## 2021-10-14 NOTE — Telephone Encounter (Signed)
Received call from [employer] regarding alert for 2nd degree AVB type I 50bpm with 8 beats of NSVT at 3:55pm. Appears there was alert from earlier today for the same as well. Attempted to reach patient by phone but unable to do so. I was able to get in touch with patient spouse who reports he currently resides in SN[facility]) doesn't answer his phone most of the time. Will route to triage to follow up regarding further plans for patient.

## 2021-10-14 NOTE — Telephone Encounter (Unsigned)
   Cardiac Monitor Alert  Date of alert:  10/14/2021   Patient Name: Manuel Chapman  DOB: [birth year]  MRN: [id number]   [facility] HeartCare Cardiologist: None  [facility] EP:  None    Monitor Information: Cardiac Event Monitor [[employer]]  Reason:  Cyrptogenic stroke, rule out a. fib Ordering provider:  Butch Penny NP { Tips for Triage    No need to delete red or blue text  It will delete when note signed  Atrial fib/flutter? Review previous note for plan of care. Confirm pt on anticoagulation.  Call patient to check for symptoms.  Confirm next appointment.  Ask pt if willing to go to afib clinic?   Other Rhythms? Follow guidelines for Red/Yellow alerts. Call patient to assess symptoms   Complete same day.  If the patient does not answer >> RN to locate as much info as possible from chart review and discuss with DOD no later than 3pm.   Alerts / Repeat Alerts? If 1st alert >> follow original protocol  If 2nd alert >> follow original protocol   If 3rd or greater alert >> call patient to ensure nothing has changed  If asymptomatic send message to ordering provider to determine if alerts for that rhythm should stop >> if ordering provider requests alerts to stop >> notify company rep  If symptomatic >> in person conversation w/ DOD/Rx Provider  RN should upload strip to Epic at end of workflow  Scan to monitor in chart :1}  Alert 2nd degree AV Block, Type II This is the 1st alert for this rhythm.   Next Cardiology Appointment { Click here to go to Appt Desk  :1}  Date:  12/18/21  Provider:  Dr. Lynnette Caffey  The patient could NOT be reached by telephone today.  10/14/21 Arrhythmia, symptoms and history reviewed with Dr. Eldridge Dace, DOD.   Plan:  Continue to monitor and try to get a hold of patient to find out how he was feeling.   Ethelda Chick, RN  10/14/2021 10:02 AM

## 2021-10-15 NOTE — Telephone Encounter (Signed)
Judeth Cornfield RN at [facility] returned RN's call.  Her call back direct number is [phone].

## 2021-10-15 NOTE — Telephone Encounter (Signed)
   Cardiac Monitor Alert  Date of alert:  10/15/2021   Patient Name: Manuel Chapman  DOB: 1946-01-04  MRN: [id number]   [facility] HeartCare Cardiologist: None  [facility] EP:  None    Monitor Information: Cardiac Event Monitor [[employer]]  Reason:  Cyrptogenic stroke, rule out a. fib Ordering provider:  Butch Penny, NP   Alert 2nd degree AV Block, Type I w/ Run of V-tach (8 beats) at 3:55pm on 10/14/21 This is the 2nd alert for this rhythm.   Next Cardiology Appointment   Date:  12/18/21  Provider:  Dr. Lynnette Caffey  The patient could NOT be reached by telephone today.  10/15/21 Arrhythmia, symptoms and history reviewed with DOD, Dr. Shari Prows, NSVT and sinus bradycardia.   Plan: Check Mag and BMET, continue to monitor.    Theresia Majors, RN  10/15/2021 8:52 AM

## 2021-10-15 NOTE — Telephone Encounter (Signed)
Spoke with the patient's spouse who reports that her daughter and son-in-law have been his caretakers and they would know more information.  Spoke with the son-in-law who states that he saw the patient yesterday and he was doing good with no complaints. He states that they have been drawing labs on him. Advised that I call [facility] and ask to speak with his nurse.  Called [facility], could not get a hold of the nurse. Receptionist took a message to have the nurse call back.

## 2021-10-15 NOTE — Telephone Encounter (Signed)
Spoke with Judeth Cornfield, Charity fundraiser at [facility]. She states that she has been taking care of the patient and he has been doing good. He had no complaints during time of monitor events. She will put in orders for lab work and have the results faxed to [place].

## 2021-10-15 NOTE — Telephone Encounter (Signed)
   Cardiac Monitor Alert  Date of alert:  10/15/2021   Patient Name: Manuel Chapman  DOB: 03/20/1946  MRN: [id number]   [facility] HeartCare Cardiologist: None  [facility] EP:  None    Monitor Information: Cardiac Event Monitor [[employer]]  Reason: Cyrptogenic stroke, rule out a. fib Ordering provider:  Butch Penny, NP     Alert Sinus Bradycardia w/ run of Ventricular Tachycardia (8) This is the 1st alert for this rhythm.   Next Cardiology Appointment   Date:  12/18/21  Provider: Dr. Lynnette Caffey  The patient could NOT be reached by telephone today.  10/15/21 Arrhythmia, symptoms and history reviewed with  DOD, Dr. Shari Prows, NSVT and sinus bradycardia. Plan: Check Mag and BMET, continue to monitor.  Theresia Majors, RN  10/15/2021 9:12 AM

## 2021-10-30 ENCOUNTER — Ambulatory Visit (HOSPITAL_COMMUNITY): Payer: Medicare HMO

## 2021-11-02 ENCOUNTER — Telehealth (HOSPITAL_COMMUNITY): Payer: Self-pay | Admitting: *Deleted

## 2021-11-02 ENCOUNTER — Ambulatory Visit (HOSPITAL_COMMUNITY)
Admission: RE | Admit: 2021-11-02 | Discharge: 2021-11-02 | Disposition: A | Discharge status: Home / Self Care | Payer: Medicare HMO | Source: Ambulatory Visit | Attending: Cardiology | Admitting: Cardiology

## 2021-11-02 ENCOUNTER — Encounter (HOSPITAL_COMMUNITY): Payer: Self-pay

## 2021-11-02 VITALS — BP 130/86 | HR 56 | Wt 213.8 lb

## 2021-11-02 DIAGNOSIS — I5032 Chronic diastolic (congestive) heart failure: Secondary | ICD-10-CM | POA: Diagnosis not present

## 2021-11-02 DIAGNOSIS — Z794 Long term (current) use of insulin: Secondary | ICD-10-CM | POA: Insufficient documentation

## 2021-11-02 DIAGNOSIS — Z7902 Long term (current) use of antithrombotics/antiplatelets: Secondary | ICD-10-CM | POA: Diagnosis not present

## 2021-11-02 DIAGNOSIS — R001 Bradycardia, unspecified: Secondary | ICD-10-CM | POA: Diagnosis not present

## 2021-11-02 DIAGNOSIS — I69354 Hemiplegia and hemiparesis following cerebral infarction affecting left non-dominant side: Secondary | ICD-10-CM | POA: Diagnosis not present

## 2021-11-02 DIAGNOSIS — I5040 Unspecified combined systolic (congestive) and diastolic (congestive) heart failure: Secondary | ICD-10-CM | POA: Diagnosis present

## 2021-11-02 DIAGNOSIS — I110 Hypertensive heart disease with heart failure: Secondary | ICD-10-CM | POA: Insufficient documentation

## 2021-11-02 DIAGNOSIS — I080 Rheumatic disorders of both mitral and aortic valves: Secondary | ICD-10-CM | POA: Diagnosis not present

## 2021-11-02 DIAGNOSIS — Z79899 Other long term (current) drug therapy: Secondary | ICD-10-CM | POA: Diagnosis not present

## 2021-11-02 DIAGNOSIS — E119 Type 2 diabetes mellitus without complications: Secondary | ICD-10-CM | POA: Diagnosis not present

## 2021-11-02 LAB — COMPREHENSIVE METABOLIC PANEL
ALT: 27 U/L (ref 0–44)
AST: 29 U/L (ref 15–41)
Albumin: 3.2 g/dL — ABNORMAL LOW (ref 3.5–5.0)
Alkaline Phosphatase: 124 U/L (ref 38–126)
Anion gap: 8 (ref 5–15)
BUN: 22 mg/dL (ref 8–23)
CO2: 23 mmol/L (ref 22–32)
Calcium: 8.3 mg/dL — ABNORMAL LOW (ref 8.9–10.3)
Chloride: 108 mmol/L (ref 98–111)
Creatinine, Ser: 1 mg/dL (ref 0.61–1.24)
GFR, Estimated: >60 mL/min (ref >60)
Glucose, Bld: 195 mg/dL — ABNORMAL HIGH (ref 70–99)
Potassium: 4.5 mmol/L (ref 3.5–5.1)
Sodium: 139 mmol/L (ref 135–145)
Total Bilirubin: 0.8 mg/dL (ref 0.3–1.2)
Total Protein: 6.6 g/dL (ref 6.5–8.1)

## 2021-11-02 LAB — BRAIN NATRIURETIC PEPTIDE: B Natriuretic Peptide: 358.1 pg/mL — ABNORMAL HIGH (ref 0.0–100.0)

## 2021-11-02 NOTE — Telephone Encounter (Signed)
Heart Failure Nurse Navigator Progress Note   Unable to leave appointment reminder message for 2 pm on 11/02/21.  Rhae Hammock, BSN, Scientist, clinical (histocompatibility and immunogenetics) Only

## 2021-11-02 NOTE — Patient Instructions (Signed)
Medication Changes:  No changes, continue current regimen.  Lab Work:  Labs done today, we will contact you for abnormal readings.   Testing/Procedures:  Your physician has requested that you have a cardiac MRI. Cardiac MRI uses a computer to create images of your heart as its beating, producing both still and moving pictures of your heart and major blood vessels. For further information please visit [email or web address]. Please follow the instruction sheet given to you today for more information.  Referrals:  You have been referred to the [facility] with Dr. Gala Romney.   Special Instructions // Education:  Do the following things EVERYDAY: Weigh yourself in the morning before breakfast. Write it down and keep it in a log. Take your medicines as prescribed Eat low salt foods--Limit salt (sodium) to 2000 mg per day.  Stay as active as you can everyday Limit all fluids for the day to less than 2 liters  Follow-Up in: 4-6 weeks after cardiac MRI.      At the [facility], you and your health needs are our priority. We have a designated team specialized in the treatment of Heart Failure. This Care Team includes your primary Heart Failure Specialized Cardiologist (physician), Advanced Practice Providers (APPs- Physician Assistants and Nurse Practitioners), and Pharmacist who all work together to provide you with the care you need, when you need it.   You may see any of the following providers on your designated Care Team at your next follow up:  Dr Arvilla Meres Dr Carron Curie, NP Robbie Lis, [place] [facility] [place], [place] Karle Plumber, PharmD   Please be sure to bring in all your medications bottles to every appointment.   Need to Contact us:  If you have any questions or concerns before your next appointment please send [place] a message through [place] or call our office at [phone].    TO LEAVE A MESSAGE  FOR THE NURSE SELECT OPTION 2, PLEASE LEAVE A MESSAGE INCLUDING: YOUR NAME DATE OF BIRTH CALL BACK NUMBER REASON FOR CALL**this is important as we prioritize the call backs  YOU WILL RECEIVE A CALL BACK THE SAME DAY AS LONG AS YOU CALL BEFORE 4:00 PM

## 2021-11-02 NOTE — Progress Notes (Signed)
HEART & VASCULAR TRANSITION OF CARE PROGRESS NOTE     Referring Physician: Dr. Verlon Au  Primary Care: Lorene Dy, MD Primary Cardiologist: Dr. Ali Lowe   HPI: Referred to clinic by Dr. Verlon Au for heart failure consultation.   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 Resides at [facility].   Recently present to [facility] from [facility] 5/23 for diarrhea and hematochezia. While being triaged, heart rate dropped from 60 with first-degree block to possible type II Mobitz block in the 30s. Cardiology consulted and recommended admit for monitoring and K replacement. AV nodal blocking agents were discontinued. HS trop 24>>69. TSH normal. No further diarrhea .  Bradycardia improved off AV nodal blocking agents. As part of w/u, an echocardiogram was obtained showing mildly reduced LVEF, 45-50%, GIIDD, mildly reduced RV and severe thickening of the aortic valve w/ severe aortic stenosis, with peak gradient of 65.59mHg and mean transvalvular gradient of 30.969mg. The patient's dimensionless index is 0.21 and calculated aortic valve area is 0.58cm. Structural heart team was consulted. Seen by Dr. ThAli LoweBeing considered for possible TAVR. Planning further outpatient w/u. Outpatient monitor arranged to assess for further bradycardia. [facility]  At initial TOC appt, he endorsed NYHA Class II-III symptoms and noted to be mildly fluid overloaded on exam. ReDS Clip 39%. BNP 405. HCTZ was discontinued and Lasix 20 mg daily was added. Also ordered to undergo w/u for amyloid w/ PYP scan and myeloma panel. PYP scan was equivocal. Visual and quantitative assessment (grade 1, H/CL = 1.24). This was also personally reviewed by Dr.  BeHaroldine Lawsho also agreed that study is equivocal and additional imaging is needed. Multiple Myeloma panel showed no M spike protein.   Also of note, heart monitor showed HR ranging from 38-103 bpm, w/ an avg of 60 bpm. No afib detected. + brief NSVT and occasional second-degree type 1 block and 2nd degree type II block noted during nocturnal hours.   Today in f/u reports doing ok. Still at SNF getting rehab. Denies resting and exertional dyspnea. Wt down 10 lb. Still w/ trace b/l LEE on exam R>L. BP 130/86. HR 56 bpm. Denies chest pain. No dizziness, syncope/ near syncope.    Cardiac Testing   Echocardiogram 05/05/2021:   1. Left ventricular ejection fraction, by estimation, is 45 to 50%. The  left ventricle has mildly decreased function. The left ventricle  demonstrates regional wall motion abnormalities (see scoring  diagram/findings for description). There is moderate  concentric left ventricular hypertrophy. Left ventricular diastolic  parameters are consistent with Grade II diastolic dysfunction  (pseudonormalization). Elevated left atrial pressure. There is akinesis of  the left ventricular, basal-mid inferolateral  wall.   2. Right ventricular systolic function is mildly reduced. The right  ventricular size is mildly enlarged. Tricuspid regurgitation signal is  inadequate for assessing PA pressure.   3. Left atrial size was mildly dilated.   4. Right atrial size was mildly dilated.   5. The mitral valve is normal in structure. Mild mitral valve  regurgitation.   6. The aortic valve is tricuspid. There is severe calcifcation of the  aortic valve. There is severe thickening of the aortic valve. Aortic valve  regurgitation is trivial. Severe aortic  valve stenosis. Aortic valve mean  gradient measures 30.9 mmHg.  Aortic valve Vmax measures 4.04 m/s.    Comparison(s): Prior images reviewed side by side. The left ventricular  function is worsened. The right ventricular systolic  function is worse.  Aortic stenosis has worsened and is now severe.    Echocardiogram 11/29/2019:   1. Hypokinesis of the basal inferolateral wall; overall normal LV  function; grade 1 diastolic dysfunction; moderate AS (mean gradient 24  mmHg); mild LAE.   2. Left ventricular ejection fraction, by estimation, is 55 to 60%. The  left ventricle has normal function. The left ventricle demonstrates  regional wall motion abnormalities (see scoring diagram/findings for  description). Left ventricular diastolic  parameters are consistent with Grade I diastolic dysfunction (impaired  relaxation).   3. Right ventricular systolic function is normal. The right ventricular  size is normal.   4. Left atrial size was mildly dilated.   5. The mitral valve is normal in structure. Trivial mitral valve  regurgitation. No evidence of mitral stenosis.   6. The aortic valve has an indeterminant number of cusps. Aortic valve  regurgitation is not visualized. Moderate aortic valve stenosis.   PYP Scan 10/13/21:  FINDINGS: Planar Visual assessment:   Anterior planar imaging demonstrates radiotracer uptake within the heart less than uptake within the adjacent ribs (Grade 1).   Quantitative assessment :   Quantitative assessment of the cardiac uptake compared to the contralateral chest wall is equal to 1.24 (H/CL = 1.24).   SPECT assessment: SPECT imaging of the chest demonstrates minimal radiotracer accumulation within the LEFT ventricle.   IMPRESSION: Visual and quantitative assessment (grade 1, H/CL = 1.24) is equivocal for transthyretin amyloidosis.    Review of Systems: [y] = yes, '[ ]'  = no   General: Weight gain '[ ]' ; Weight loss '[ ]' ; Anorexia '[ ]' ; Fatigue '[ ]' ; Fever '[ ]' ; Chills '[ ]' ; Weakness [Y ]  Cardiac: Chest pain/pressure '[ ]' ; Resting SOB '[ ]' ; Exertional SOB '[ ]' ; Orthopnea '[ ]' ; Pedal Edema [Y]; Palpitations '[ ]' ; Syncope '[ ]' ; Presyncope '[ ]' ; Paroxysmal nocturnal dyspnea'[ ]'   Pulmonary:  Cough '[ ]' ; Wheezing'[ ]' ; Hemoptysis'[ ]' ; Sputum '[ ]' ; Snoring '[ ]'   GI: Vomiting'[ ]' ; Dysphagia'[ ]' ; Melena'[ ]' ; Hematochezia '[ ]' ; Heartburn'[ ]' ; Abdominal pain '[ ]' ; Constipation '[ ]' ; Diarrhea '[ ]' ; BRBPR '[ ]'   GU: Hematuria'[ ]' ; Dysuria '[ ]' ; Nocturia'[ ]'   Vascular: Pain in legs with walking '[ ]' ; Pain in feet with lying flat '[ ]' ; Non-healing sores '[ ]' ; Stroke '[ ]' ; TIA '[ ]' ; Slurred speech '[ ]' ;  Neuro: Headaches'[ ]' ; Vertigo'[ ]' ; Seizures'[ ]' ; Paresthesias'[ ]' ;Blurred vision '[ ]' ; Diplopia '[ ]' ; Vision changes '[ ]'   Ortho/Skin: Arthritis '[ ]' ; Joint pain '[ ]' ; Muscle pain '[ ]' ; Joint swelling '[ ]' ; Back Pain '[ ]' ; Rash '[ ]'   Psych: Depression'[ ]' ; Anxiety'[ ]'   Heme: Bleeding problems '[ ]' ; Clotting disorders '[ ]' ; Anemia '[ ]'   Endocrine: Diabetes '[ ]' ; Thyroid dysfunction'[ ]'    Past Medical History:  Diagnosis Date   Aortic stenosis 09/16/2021   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     atorvastatin (LIPITOR) 80 MG tablet Take 1 tablet (80 mg total) by mouth daily.     cloNIDine (CATAPRES) 0.2 MG tablet Take 1 tablet (0.2 mg total) by mouth 3 (three) times daily.     clopidogrel (PLAVIX) 75 MG tablet Take 1 tablet (75 mg total) by mouth daily.     ferrous sulfate 325 (65 FE) MG tablet Take 1 tablet (325 mg total) by mouth 2 (two) times daily with a meal.  3   furosemide (LASIX) 20 MG tablet Take 1 tablet (20 mg total) by mouth daily. 90 tablet 3   hydrALAZINE (APRESOLINE) 100 MG tablet Take 1 tablet (100 mg total) by mouth 4 (four) times daily.  (Patient taking differently: Take 100 mg by mouth 3 (three) times daily.)     insulin detemir (LEVEMIR) 100 UNIT/ML injection Inject 0.12 mLs (12 Units total) into the skin daily. (Patient taking differently: Inject 10-22 Units into the skin See admin instructions. Inject 22 units subcutaneous at 0800, then inject 10 units subcutaneous at 2000 per MAR) 10 mL 11   lisinopril (ZESTRIL) 40 MG tablet Take 1 tablet (40 mg total) by mouth daily. 30 tablet 1   Multiple Vitamin (MULTIVITAMIN WITH MINERALS) TABS tablet Take 1 tablet by mouth daily.     ondansetron (ZOFRAN) 4 MG tablet Take 4 mg by mouth every 8 (eight) hours as needed.     potassium chloride SA (KLOR-CON M) 20 MEQ tablet Take 1 tablet (20 mEq total) by mouth daily. 90 tablet 3   allopurinol (ZYLOPRIM) 300 MG tablet Take 300 mg by mouth daily.     No current facility-administered medications for this encounter.    No Known Allergies    Social History   Socioeconomic History   Marital status: Married    Spouse name: Chesley Noon   Number of children: 4   Years of education: Not on file   Highest education level: High school graduate  Occupational History   Occupation: retired  Tobacco Use   Smoking status: Former    Packs/day: 1.00    Years: 10.00    Total pack years: 10.00    Types: Cigarettes    Quit date: 1998    Years since quitting: 25.5   Smokeless tobacco: Never  Vaping Use   Vaping Use: Never used  Substance and Sexual Activity   Alcohol use: Not Currently    Comment: not much, h/o heavy beer use   Drug use: No   Sexual activity: Not on file  Other Topics Concern   Not on file  Social History Narrative   Not on file   Social Determinants of Health   Financial Resource Strain: Low Risk  (09/17/2021)   Overall Financial Resource Strain (CARDIA)    Difficulty of Paying Living Expenses: Not hard at all  Food Insecurity: No Food Insecurity (09/17/2021)   Hunger Vital Sign    Worried About Running Out of  Food in the Last Year: Never true    [place] in the Last Year: Never true  Transportation Needs: No Transportation Needs (09/17/2021)   PRAPARE - Hydrologist (Medical): No    Lack of Transportation (Non-Medical): No  Physical Activity: Not on file  Stress: Not on file  Social Connections: Not on file  Intimate Partner Violence: Not on file  Family History  Problem Relation Age of Onset   Diabetes Neg Hx    Stroke Neg Hx     Vitals:   11/02/21 1407  BP: 130/86  Pulse: (!) 56  SpO2: 99%  Weight: 97 kg (213 lb 12.8 oz)     PHYSICAL EXAM: General:  elderly AAM in [place]. No respiratory difficulty HEENT: normal Neck: supple. JVD 6 cm. Carotids 2+ bilat; no bruits. No lymphadenopathy or thyromegaly appreciated. Cor: PMI nondisplaced. Regular rate & rhythm. +3/6 AS murmur radiates to back  Lungs: clear Abdomen: soft, nontender, nondistended. No hepatosplenomegaly. No bruits or masses. Good bowel sounds. Extremities: no cyanosis, clubbing, rash, trace b/l LE edema R>L  Neuro: alert & oriented x 3, cranial nerves grossly intact. moves all 4 extremities w/o difficulty. Affect pleasant.   ECG: Not performed    ASSESSMENT & PLAN:  Combined Systolic and Diastolic Heart Failure - Echo 5/23 EF 45-50%, GIIDD, mildly reduced RV - NYHA Class II, confounded by deconditioning - Continue Lasix 20 mg daily. Check BMP and BNP today  - Will need to avoid hypotension/volume depletion w/ severe AS. Holding addition of SLGT2i, ARNI and MRA for now - W/ concomitant conduction disease w/ known AV block and severe AS, ? Amyloid. PYP Scan was done 6/23 and visual and quantitative assessment (grade 1, H/CL = 1.24) is equivocal for transthyretin amyloidosis. Study also personally reviewed along side Dr. Haroldine Laws who agrees study is equivocal and additional testing is warranted.  - Multiple Myeloma Panel no M-spike protein  - arrange cMRI to further assessment.  D/w pt and son-in-law today. He agrees to further testing   - refer to the [facility] (Dr. Haroldine Laws) after cMRI is completed   2. Severe Aortic Stenosis  - recent echo showed severe thickening of the aortic valve w/ severe aortic stenosis, with peak gradient of 65.20mHg and mean transvalvular gradient of 30.995mg. The patient's dimensionless index is 0.21 and calculated aortic valve area is 0.58cm. - Denies CP, dyspnea, syncope  - Dr. ThAli Loweollowing for possible TAVR   3. Bradycardia  - type II Mobitz block in the 30s recent hospitalization - improved w/ discontinuation of AV nodal blockers - 30 day outpatient monitor w/ some bradyardia as outlined above, avg HR 60s and 2nd degree type II block noted during nocturnal hours - no current indication for PPM - continue to avoid AV nodal blockers  - amyloid w/u per above   4. H/o CVA - residual LUE weakness, resides at SNF  - on Plavix and Statin - BP well controlled - he initially deferred loop at time of diagnosis for afib surveillance - 30 day monitor showed no Afib.   NYHA II GDMT  Diuretic- Lasix 20 mg daily  BB- No, bradycardia  Ace/ARB/ARNI lisinopril (holding ARNi, risk for hypotension)  MRA Not yet  SGLT2i No (risk for volume depletion)     Referred to HFSW (PCP, Medications, Transportation, ETOH Abuse, Drug Abuse, Insurance, Financial ): No Refer to Pharmacy: No Refer to Home Health:  No Refer to Advanced Heart Failure Clinic: Yes (Dr. BeHolland FallingYP Scan. Needs cMRI) Refer to General Cardiology: Yes (followed by structural heart team, pending possible TAVR)   [facility] Assign to Dr. BeHaroldine Laws

## 2021-11-26 ENCOUNTER — Telehealth (HOSPITAL_COMMUNITY): Payer: Self-pay | Admitting: Emergency Medicine

## 2021-11-26 NOTE — Telephone Encounter (Signed)
Attempted to call patient regarding upcoming cardiac MR appointment. Left message on voicemail with name and callback number Jaston Havens RN Navigator Cardiac Imaging [facility] [phone] Office [phone] Cell  

## 2021-11-27 ENCOUNTER — Ambulatory Visit (HOSPITAL_COMMUNITY): Payer: Medicare HMO | Attending: Cardiology

## 2021-12-09 ENCOUNTER — Encounter (HOSPITAL_COMMUNITY): Payer: Medicare HMO

## 2021-12-16 NOTE — Progress Notes (Signed)
Patient ID: Manuel Chapman MRN: [id number] DOB/AGE: 1946/01/20 76 y.o.  Primary Care Physician:Roberts, Windy Fast, MD Primary Cardiologist: Alverda Skeans, MD  PATIENT DID NOT APPEAR FOR APPOINTMENT   FOCUSED CARDIOVASCULAR PROBLEM LIST:   1.  Severe aortic stenosis with an aortic valve area 0.88 cm and mean gradient of 39 mmHg with an ejection fraction of 55 to 60% without significant other valvular abnormalities; conduction demonstrates sinus rhythm with first-degree AV block 2.  History of cerebellar stroke 2023; history of subarachnoid hemorrhage and left subcortical and right temporal embolic infarct 9476 3.  History of transient Mobitz type II/III block during admission for GI bleeding May 2023; not on nodal blocking agents 4.  Type 2 diabetes on insulin 5.  Hypertension 6.  Hyperlipidemia 7.  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 male with the indicated medical history here for hospital follow-up.  The patient was admitted in May 2023 with a possible GI bleed.  He was was relatively bradycardic with possible Mobitz type II or type III block with heart rates in the 30s.  AV nodal blocking agents were discontinued.  An echocardiogram was performed which demonstrated moderate to severe aortic stenosis.  During that evaluation it was thought that he was a marginal candidate for an aortic valve intervention and a plan was advised for him to follow-up in 3 months to assess further.  He was seen in in the [facility] clinic in June and was noted to be able to ambulate with a walker.  He was found to be mildly fluid overloaded and Lasix was started.  A monitor had been placed which demonstrated no atrial fibrillation with occasional type I and second-degree type II block seen during  nocturnal hours.  Nonsustained ventricular tachycardia was also apparent.  He was seen in the [facility] clinic in July.  He denied any resting or exertional dyspnea in his weight was noted to be decreased by 10 pounds.  Given a equivocal PYP study he was referred for a cardiac MRI especially given his conduction disease.    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  Family History  Problem Relation Age of Onset   Diabetes Neg Hx    Stroke Neg Hx     Social History   Socioeconomic History   Marital status: Married    Spouse name: Gaylyn Rong   Number of children: 4   Years of education: Not on file   Highest education level: High school graduate  Occupational History   Occupation: retired  Tobacco Use   Smoking status: Former    Packs/day: 1.00    Years: 10.00    Total pack years: 10.00    Types: Cigarettes    Quit date: 1998    Years since quitting: 25.6   Smokeless tobacco: Never  Vaping Use   Vaping Use: Never used  Substance and Sexual Activity   Alcohol use: Not Currently    Comment:  not much, h/o heavy beer use   Drug use: No   Sexual activity: Not on file  Other Topics Concern   Not on file  Social History Narrative   Not on file   Social Determinants of Health   Financial Resource Strain: Low Risk  (09/17/2021)   Overall Financial Resource Strain (CARDIA)    Difficulty of Paying Living Expenses: Not hard at all  Food Insecurity: No Food Insecurity (09/17/2021)   Hunger Vital Sign    Worried About Running Out of Food in the Last Year: Never true    Ran Out of Food in the Last Year: Never true   Transportation Needs: No Transportation Needs (09/17/2021)   PRAPARE - Administrator, Civil Service (Medical): No    Lack of Transportation (Non-Medical): No  Physical Activity: Not on file  Stress: Not on file  Social Connections: Not on file  Intimate Partner Violence: Not on file     Prior to Admission medications   Medication Sig Start Date End Date Taking? Authorizing Provider  acetaminophen (TYLENOL) 325 MG tablet Take 2 tablets (650 mg total) by mouth every 4 (four) hours as needed for mild pain (or temp > 37.5 C (99.5 F)). 05/09/21   Elgergawy, Leana Roe, MD  allopurinol (ZYLOPRIM) 300 MG tablet Take 300 mg by mouth daily.    [provider]  atorvastatin (LIPITOR) 80 MG tablet Take 1 tablet (80 mg total) by mouth daily. 06/22/21   Angiulli, Mcarthur Rossetti, PA-C  cloNIDine (CATAPRES) 0.2 MG tablet Take 1 tablet (0.2 mg total) by mouth 3 (three) times daily. 06/22/21   Angiulli, Mcarthur Rossetti, PA-C  clopidogrel (PLAVIX) 75 MG tablet Take 1 tablet (75 mg total) by mouth daily. 05/10/21   Elgergawy, Leana Roe, MD  ferrous sulfate 325 (65 FE) MG tablet Take 1 tablet (325 mg total) by mouth 2 (two) times daily with a meal. 09/18/21   Rhetta Mura, MD  furosemide (LASIX) 20 MG tablet Take 1 tablet (20 mg total) by mouth daily. 09/25/21   Robbie Lis M, PA-C  hydrALAZINE (APRESOLINE) 100 MG tablet Take 1 tablet (100 mg total) by mouth 4 (four) times daily. Patient taking differently: Take 100 mg by mouth 3 (three) times daily. 07/01/21   Angiulli, Mcarthur Rossetti, PA-C  insulin detemir (LEVEMIR) 100 UNIT/ML injection Inject 0.12 mLs (12 Units total) into the skin daily. Patient taking differently: Inject 10-22 Units into the skin See admin instructions. Inject 22 units subcutaneous at 0800, then inject 10 units subcutaneous at 2000 per [facility] 07/02/21   Angiulli, Mcarthur Rossetti, PA-C  lisinopril (ZESTRIL) 40 MG tablet Take 1 tablet (40 mg total) by mouth daily. 12/05/19   Arline Asp, NP   Multiple Vitamin (MULTIVITAMIN WITH MINERALS) TABS tablet Take 1 tablet by mouth daily. 05/10/21   Elgergawy, Leana Roe, MD  ondansetron (ZOFRAN) 4 MG tablet Take 4 mg by mouth every 8 (eight) hours as needed. 08/10/21   [provider]  potassium chloride SA (KLOR-CON M) 20 MEQ tablet Take 1 tablet (20 mEq total) by mouth daily. 09/25/21   Robbie Lis M, PA-C         DATA AND STUDIES:  EKG: May 2023 sinus rhythm with first-degree AV block and occasional PACs  2D ECHO: May 2023 ejection fraction of 55 to 60% with severe aortic stenosis with a peak velocity of 4.6 m/s, mean gradient of 39 mmHg, and aortic valve area of 0.88 cm with no other significant valvular  abnormalities  CARDIAC CATH: n/a  STS RISK CALCULATOR:  Procedure: AV Replacement   Risk of Mortality: 2.427% Renal Failure: 8.820% Permanent Stroke: 1.955% Prolonged Ventilation: 12.005% DSW Infection: 0.286% Reoperation: 3.715% Morbidity or Mortality: 18.385% Short Length of Stay: 18.033% Long Length of Stay: 10.931%  NHYA CLASS:     ASSESSMENT AND PLAN:   Nonrheumatic aortic valve stenosis  Chronic diastolic heart failure (HCC)     Orbie Pyo, MD  12/18/2021 1:57 PM    [facility] [address] [place], [place], [place]  [address] Phone: [phone]; Fax: [phone]

## 2021-12-18 ENCOUNTER — Ambulatory Visit (INDEPENDENT_AMBULATORY_CARE_PROVIDER_SITE_OTHER): Payer: Medicare HMO | Admitting: Internal Medicine

## 2021-12-18 DIAGNOSIS — I5032 Chronic diastolic (congestive) heart failure: Secondary | ICD-10-CM

## 2021-12-18 DIAGNOSIS — I350 Nonrheumatic aortic (valve) stenosis: Secondary | ICD-10-CM

## 2021-12-21 ENCOUNTER — Telehealth: Payer: Self-pay

## 2021-12-21 NOTE — Telephone Encounter (Signed)
Pt no showed for 8/25 structural heart appointment with Dr Lynnette Caffey. I called the number listed in chart and no answer. I will continue to try and reach the patient to reschedule this visit.

## 2021-12-31 ENCOUNTER — Telehealth (HOSPITAL_COMMUNITY): Payer: Self-pay

## 2021-12-31 NOTE — Telephone Encounter (Signed)
Called and was unable to leave a voice mail to confirm/remind patient of their appointment at the [facility] on 01/04/22.

## 2021-12-31 NOTE — Telephone Encounter (Signed)
I spoke with the pt's daughter and rescheduled appointment with Dr Lynnette Caffey on 9/14 at 2:00 PM.

## 2022-01-04 ENCOUNTER — Ambulatory Visit (HOSPITAL_COMMUNITY)
Admission: RE | Admit: 2022-01-04 | Discharge: 2022-01-04 | Disposition: A | Discharge status: Home / Self Care | Payer: Medicare HMO | Source: Ambulatory Visit | Attending: Family Medicine | Admitting: Family Medicine

## 2022-01-04 ENCOUNTER — Encounter (HOSPITAL_COMMUNITY): Payer: Self-pay

## 2022-01-04 VITALS — BP 136/72 | HR 75 | Wt 204.8 lb

## 2022-01-04 DIAGNOSIS — I4430 Unspecified atrioventricular block: Secondary | ICD-10-CM | POA: Diagnosis not present

## 2022-01-04 DIAGNOSIS — I110 Hypertensive heart disease with heart failure: Secondary | ICD-10-CM | POA: Diagnosis not present

## 2022-01-04 DIAGNOSIS — Z79899 Other long term (current) drug therapy: Secondary | ICD-10-CM | POA: Insufficient documentation

## 2022-01-04 DIAGNOSIS — I350 Nonrheumatic aortic (valve) stenosis: Secondary | ICD-10-CM

## 2022-01-04 DIAGNOSIS — Z8673 Personal history of transient ischemic attack (TIA), and cerebral infarction without residual deficits: Secondary | ICD-10-CM

## 2022-01-04 DIAGNOSIS — I5032 Chronic diastolic (congestive) heart failure: Secondary | ICD-10-CM | POA: Diagnosis present

## 2022-01-04 DIAGNOSIS — I441 Atrioventricular block, second degree: Secondary | ICD-10-CM | POA: Insufficient documentation

## 2022-01-04 DIAGNOSIS — R001 Bradycardia, unspecified: Secondary | ICD-10-CM | POA: Diagnosis not present

## 2022-01-04 DIAGNOSIS — I5040 Unspecified combined systolic (congestive) and diastolic (congestive) heart failure: Secondary | ICD-10-CM | POA: Diagnosis not present

## 2022-01-04 DIAGNOSIS — Z7902 Long term (current) use of antithrombotics/antiplatelets: Secondary | ICD-10-CM | POA: Insufficient documentation

## 2022-01-04 DIAGNOSIS — I69354 Hemiplegia and hemiparesis following cerebral infarction affecting left non-dominant side: Secondary | ICD-10-CM | POA: Diagnosis not present

## 2022-01-04 LAB — BASIC METABOLIC PANEL
Anion gap: 7 (ref 5–15)
BUN: 18 mg/dL (ref 8–23)
CO2: 21 mmol/L — ABNORMAL LOW (ref 22–32)
Calcium: 8.5 mg/dL — ABNORMAL LOW (ref 8.9–10.3)
Chloride: 112 mmol/L — ABNORMAL HIGH (ref 98–111)
Creatinine, Ser: 1.01 mg/dL (ref 0.61–1.24)
GFR, Estimated: >60 mL/min (ref >60)
Glucose, Bld: 124 mg/dL — ABNORMAL HIGH (ref 70–99)
Potassium: 4.5 mmol/L (ref 3.5–5.1)
Sodium: 140 mmol/L (ref 135–145)

## 2022-01-04 LAB — BRAIN NATRIURETIC PEPTIDE: B Natriuretic Peptide: 300.8 pg/mL — ABNORMAL HIGH (ref 0.0–100.0)

## 2022-01-04 MED ORDER — LOSARTAN POTASSIUM 25 MG PO TABS: 12.5000 mg | ORAL_TABLET | Freq: Every evening | ORAL | 1 refills | Status: DC

## 2022-01-04 NOTE — Patient Instructions (Addendum)
Thank you for coming in today  Labs were done today, if any labs are abnormal the clinic will call you No news is good news  Your physician recommends that you return for lab work in: please draw BMP in 10-14 days and fax over to clinic [phone]  START Losartan 12.5 mg 1/2 tablet every night  Your physician recommends that you schedule a follow-up appointment in:  2-3 months with Dr. Gala Romney 03/15/2022 at 10:40 am    Do the following things EVERYDAY: Weigh yourself in the morning before breakfast. Write it down and keep it in a log. Take your medicines as prescribed Eat low salt foods--Limit salt (sodium) to 2000 mg per day.  Stay as active as you can everyday Limit all fluids for the day to less than 2 liters  At the [facility], you and your health needs are our priority. As part of our continuing mission to provide you with exceptional heart care, we have created designated Provider Care Teams. These Care Teams include your primary Cardiologist (physician) and Advanced Practice Providers (APPs- Physician Assistants and Nurse Practitioners) who all work together to provide you with the care you need, when you need it.   You may see any of the following providers on your designated Care Team at your next follow up: Dr Arvilla Meres Dr Marca Ancona Dr. Marcos Eke, NP Robbie Lis, [place] [facility] [place], [place] Brynda Peon, NP Karle Plumber, PharmD   Please be sure to bring in all your medications bottles to every appointment.   If you have any questions or concerns before your next appointment please send [place] a message through [place] or call our office at [phone].    TO LEAVE A MESSAGE FOR THE NURSE SELECT OPTION 2, PLEASE LEAVE A MESSAGE INCLUDING: YOUR NAME DATE OF BIRTH CALL BACK NUMBER REASON FOR CALL**this is important as we prioritize the call backs  YOU WILL RECEIVE A CALL BACK THE SAME DAY AS LONG AS YOU  CALL BEFORE 4:00 PM

## 2022-01-04 NOTE — Progress Notes (Signed)
ADVANCED HF CLINIC CONSULT NOTE  PCP: Lorene Dy, MD Primary Cardiologist: Dr. Ali Lowe HF Cardiologist: Dr. 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 Resides at [facility]    Seen in [facility] 5/23 for diarrhea and hematochezia. While being triaged, heart rate dropped from 60 with first-degree block to possible type II Mobitz block in the 30s. Cardiology consulted and recommended admit for monitoring and K replacement. AV nodal blocking agents were discontinued.  Bradycardia improved off AV nodal blocking agents.Echo showed mildly reduced EF, 45-50%, GIIDD, mildly reduced RV and severe thickening of the aortic valve w/ severe aortic stenosis, with peak gradient of 65.20mHg and mean transvalvular gradient of 30.959mg. The patient's dimensionless index is 0.21 and calculated aortic valve area is 0.58cm. Structural heart team was consulted and being considered for possible TAVR. Outpatient monitor arranged to assess for further bradycardia. [facility]   [facility] amyloid work up initiated. PYP scan was equivocal. Visual and quantitative assessment (grade 1, H/CL = 1.24). Reviewed by Dr. BeHaroldine Lawsho also agreed that study is equivocal and additional imaging is needed. Multiple Myeloma panel showed no M spike protein.    Heart monitor (6/23) showed HR ranging from 38-103 bpm, w/ an avg of 60 bpm. No afib detected. + brief NSVT and occasional second-degree type 1 block and 2nd degree type II block noted during nocturnal hours.    Today [facility] follow [facility] He is able to transfer out of his  WC without SOB, says he can stand and walk for short distances. He is here with his son in law who says he moves very little and requires a lot of assistance. Denies palpitations, CP, dizziness, edema, or PND/Orthopnea. Appetite ok. No fever or chills. Taking all medications provided by facility.    Cardiac Testing    - PYP Scan (6/23): Visual and quantitative assessment (grade 1, H/CL = 1.24) is equivocal for transthyretin amyloidosis.    - Echo (1/23): EF 45-50%, LV mildly decreased, moderate LVH, grade II DD, RV mildly reduced  - Echo (8/21): EF 55-60%, grade I DD, RV normal   Review of Systems: [y] = yes, _0  = no    General: Weight gain _1 ; Weight loss _2 ; Anorexia _3 ; Fatigue _4 ; Fever _5 ; Chills _6 ; Weakness [Y ]  Cardiac: Chest pain/pressure _7 ; Resting SOB _8 ; Exertional SOB _9 ; Orthopnea _10 ; Pedal Edema _11 ; Palpitations _12 ; Syncope _13 ; Presyncope _14 ; Paroxysmal nocturnal dyspnea_15   Pulmonary: Cough _16 ; Wheezing_17 ; Hemoptysis_18 ; Sputum _19 ; Snoring _20   GI: Vomiting_21 ; Dysphagia_22 ; Melena_23 ; Hematochezia _24 ; Heartburn_25 ; Abdominal pain _26 ; Constipation _27 ; Diarrhea _28 ; BRBPR _29   GU: Hematuria_30 ; Dysuria _31 ; Nocturia_32   Vascular: Pain in legs with walking _33 ; Pain in feet with lying flat _34 ; Non-healing sores _35 ; Stroke [yBlue.Reese; TIA _36 ; Slurred speech _37 ;  Neuro: Headaches_38 ; Vertigo_39 ; Seizures_40 ; Paresthesias_41 ;Blurred vision _42 ; Diplopia _43 ;  Vision changes _0   Ortho/Skin: Arthritis _1 ; Joint pain _2 ; Muscle pain _3 ; Joint swelling _4 ; Back Pain _5 ; Rash _6   Psych: Depression_7 ; Anxiety_8   Heme: Bleeding problems _9 ; Clotting disorders _10 ; Anemia _11   Endocrine: Diabetes Blue.Reese ]; Thyroid dysfunction_12    Past Medical History:  Diagnosis Date   Aortic stenosis 09/16/2021   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     atorvastatin (LIPITOR) 80 MG tablet Take 1 tablet (80 mg total) by mouth daily.     cholecalciferol (VITAMIN D3) 25 MCG (1000 UNIT) tablet Take 1,000 Units by mouth daily.     cloNIDine (CATAPRES) 0.2 MG tablet Take 1 tablet (0.2 mg total) by mouth 3 (three) times daily.     clopidogrel (PLAVIX) 75 MG tablet Take 1 tablet (75 mg total) by mouth daily.     feeding supplement, GLUCERNA SHAKE, (GLUCERNA SHAKE) LIQD Take 237 mLs by mouth 2 (two) times daily between meals.     ferrous sulfate 325 (65 FE) MG tablet Take 1 tablet (325 mg total) by mouth 2 (two) times daily with a meal.  3   furosemide (LASIX) 20 MG tablet Take 1 tablet (20 mg total) by mouth daily. 90 tablet 3   hydrALAZINE (APRESOLINE) 100 MG tablet Take 100 mg by mouth 3 (three) times daily.     insulin detemir (LEVEMIR) 100 UNIT/ML injection Inject 0.12 mLs (12 Units total) into the skin daily. (Patient taking differently: Inject 10-22 Units into the skin See admin instructions. Inject 22 units subcutaneous at 0800, then inject 10 units subcutaneous at 2000 per MAR) 10 mL 11   lisinopril (ZESTRIL) 40 MG tablet Take 1 tablet (40 mg total) by mouth daily. 30 tablet 1   Multiple Vitamin (MULTIVITAMIN WITH MINERALS) TABS tablet Take 1 tablet by mouth daily.     ondansetron (ZOFRAN) 4 MG tablet Take 4 mg by mouth every 8 (eight) hours as needed.     potassium chloride SA (KLOR-CON M) 20 MEQ tablet Take 1 tablet (20 mEq total) by mouth daily. 90 tablet 3   allopurinol (ZYLOPRIM) 300 MG tablet Take 300 mg by mouth daily. (Patient not taking: Reported on 01/04/2022)     No current  facility-administered medications for this encounter.   No Known Allergies  Social History   Socioeconomic History   Marital status: Married    Spouse name: Chesley Noon   Number of children: 4   Years of education: Not on file   Highest education level: High school graduate  Occupational History   Occupation: retired  Tobacco Use   Smoking status: Former    Packs/day: 1.00    Years: 10.00    Total pack years: 10.00    Types: Cigarettes    Quit date: 1998    Years since quitting: 25.7   Smokeless tobacco: Never  Vaping Use   Vaping Use: Never used  Substance and Sexual Activity   Alcohol use: Not Currently  Comment: not much, h/o heavy beer use   Drug use: No   Sexual activity: Not on file  Other Topics Concern   Not on file  Social History Narrative   Not on file   Social Determinants of Health   Financial Resource Strain: Low Risk  (09/17/2021)   Overall Financial Resource Strain (CARDIA)    Difficulty of Paying Living Expenses: Not hard at all  Food Insecurity: No Food Insecurity (09/17/2021)   Hunger Vital Sign    Worried About Running Out of Food in the Last Year: Never true    Ran Out of Food in the Last Year: Never true  Transportation Needs: No Transportation Needs (09/17/2021)   PRAPARE - Hydrologist (Medical): No    Lack of Transportation (Non-Medical): No  Physical Activity: Not on file  Stress: Not on file  Social Connections: Not on file  Intimate Partner Violence: Not on file   Family History  Problem Relation Age of Onset   Diabetes Neg Hx    Stroke Neg Hx    BP 136/72   Pulse 75   Wt 92.9 kg (204 lb 12.8 oz)   SpO2 97%   BMI 33.06 kg/m   Wt Readings from Last 3 Encounters:  01/04/22 92.9 kg (204 lb 12.8 oz)  11/02/21 97 kg (213 lb 12.8 oz)  09/24/21 123.6 kg (272 lb 6.4 oz)   PHYSICAL EXAM: General:  NAD. No resp difficulty, arrived in [facility] HEENT: Normal Neck: Supple. No JVD. Carotids 2+ bilat; no  bruits. No lymphadenopathy or thryomegaly appreciated. Cor: PMI nondisplaced. Regular rate & rhythm. No rubs, gallops, 3/6 SEM Lungs: Clear, diminished in bases. Abdomen: Soft, nontender, nondistended. No hepatosplenomegaly. No bruits or masses. Good bowel sounds. Extremities: No cyanosis, clubbing, rash, edema Neuro: Alert & oriented x 3, cranial nerves grossly intact. Moves all 4 extremities w/o difficulty. Affect pleasant. + LUE weakness, mild dysarthria.  ECG: SR with 1st AVB, 70 bpm, PR 280 msec (personally reviewed).  ASSESSMENT & PLAN: Combined Systolic and Diastolic Heart Failure - Echo 5/23 EF 45-50%, GIIDD, mildly reduced RV - W/ concomitant conduction disease w/ known AV block and severe AS, ? Amyloid. PYP Scan was done 6/23 and visual and quantitative assessment (grade 1, H/CL = 1.24) is equivocal for transthyretin amyloidosis. Study also personally reviewed by Dr. Haroldine Laws who agrees study is equivocal and additional testing is warranted.  - Multiple Myeloma Panel no M-spike protein  - Arrange cMRI to further assess.  - NYHA Class II, confounded by physical deconditioning - Start losartan 12.5 mg q hs. - Continue Lasix 20 mg daily.  - No SGLT2i or MRA for now. Avoid hypotension/volume depletion w/ severe AS.  - Labs today. BMET in 2 weeks.  2. Aortic Stenosis  - Severe by Echo 5/23, peak gradient of 65.2 mmHg and mean transvalvular gradient of 30.9 mmHg. Dimensionless index is 0.21 and calculated aortic valve area is 0.58 cm. - Denies CP, dyspnea, syncope.  - Dr. Ali Lowe following for possible TAVR. ? Candidacy with limited functional capacity.   3. Bradycardia  - type II Mobitz block in the 30s recent hospitalization - Improved w/ discontinuation of AV nodal blockers. - 30 day monitor (6/23): some bradycardia, avg HR 60s and 2nd degree type II block noted during nocturnal hours - No current indication for PPM. NSR on ECG today. - Continue to avoid AV nodal blockers  -  Amyloid w/u per above.    4. H/o CVA -  He initially deferred loop at time of diagnosis for afib surveillance - 30 day monitor showed no Afib.  - Residual LUE weakness, resides at SNF  - Continue Plavix + statin - BP well controlled  Schedule cMRI and follow up with Dr. Haroldine Laws afterwards.  Allena Katz, FNP-BC 01/04/22

## 2022-01-07 ENCOUNTER — Encounter: Payer: Self-pay | Admitting: Internal Medicine

## 2022-01-07 ENCOUNTER — Ambulatory Visit: Payer: Medicare HMO | Attending: Internal Medicine | Admitting: Internal Medicine

## 2022-01-07 VITALS — BP 132/64 | HR 70

## 2022-01-07 DIAGNOSIS — Z794 Long term (current) use of insulin: Secondary | ICD-10-CM

## 2022-01-07 DIAGNOSIS — E1159 Type 2 diabetes mellitus with other circulatory complications: Secondary | ICD-10-CM

## 2022-01-07 DIAGNOSIS — Z8673 Personal history of transient ischemic attack (TIA), and cerebral infarction without residual deficits: Secondary | ICD-10-CM | POA: Diagnosis not present

## 2022-01-07 DIAGNOSIS — I152 Hypertension secondary to endocrine disorders: Secondary | ICD-10-CM

## 2022-01-07 DIAGNOSIS — I350 Nonrheumatic aortic (valve) stenosis: Secondary | ICD-10-CM | POA: Diagnosis not present

## 2022-01-07 DIAGNOSIS — E785 Hyperlipidemia, unspecified: Secondary | ICD-10-CM

## 2022-01-07 DIAGNOSIS — E1169 Type 2 diabetes mellitus with other specified complication: Secondary | ICD-10-CM

## 2022-01-07 DIAGNOSIS — E118 Type 2 diabetes mellitus with unspecified complications: Secondary | ICD-10-CM | POA: Diagnosis not present

## 2022-01-07 NOTE — Patient Instructions (Signed)
Medication Instructions:  Your physician recommends that you continue on your current medications as directed. Please refer to the Current Medication list given to you today.  *If you need a refill on your cardiac medications before your next appointment, please call your pharmacy*  Testing/Procedures: Your physician has requested that you have an echocardiogram in 3 months. Echocardiography is a painless test that uses sound waves to create images of your heart. It provides your doctor with information about the size and shape of your heart and how well your heart's chambers and valves are working. This procedure takes approximately one hour. There are no restrictions for this procedure.  Follow-Up: At [facility], you and your health needs are our priority.  As part of our continuing mission to provide you with exceptional heart care, we have created designated Provider Care Teams.  These Care Teams include your primary Cardiologist (physician) and Advanced Practice Providers (APPs -  Physician Assistants and Nurse Practitioners) who all work together to provide you with the care you need, when you need it.  Your next appointment:   3-4 month(s)  The format for your next appointment:   In Person  Provider:   Alverda Skeans, MD

## 2022-01-07 NOTE — Progress Notes (Signed)
Patient ID: Manuel Chapman MRN: [id number] DOB/AGE: Aug 29, 1945 76 y.o.  Primary Care Physician:Roberts, Jori Moll, MD Primary Cardiologist: Lenna Sciara, MD   FOCUSED CARDIOVASCULAR PROBLEM LIST:   1.  Severe aortic stenosis with an aortic valve area 0.88 cm and mean gradient of 39 mmHg with an ejection fraction of 55 to 60% without significant other valvular abnormalities; conduction demonstrates sinus rhythm with first-degree AV block 2.  History of cerebellar stroke 2023; history of subarachnoid hemorrhage and left subcortical and right temporal embolic infarct 3888 with residual left upper extremity weakness 3.  History of transient Mobitz type II/III block during admission for GI bleeding May 2023; not on nodal blocking agents 4.  Type 2 diabetes on insulin 5.  Hypertension 6.  Hyperlipidemia 7.  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 male with the indicated medical history here for hospital follow-up.  The patient was admitted in May 2023 with a possible GI bleed.  He was was relatively bradycardic with possible Mobitz type II or type III block with heart rates in the 30s.  AV nodal blocking agents were discontinued.  An echocardiogram was performed which demonstrated moderate to severe aortic stenosis.  During that evaluation it was thought that he was a marginal candidate for an aortic valve intervention and a plan was advised for him to follow-up in 3 months to assess further.  He was seen in in the [facility] clinic in June and was noted to be able to ambulate with a walker.  He was found to be mildly fluid overloaded and Lasix was started.  A monitor had been placed which demonstrated no atrial fibrillation with occasional type I and second-degree type II block seen during  nocturnal hours.  Nonsustained ventricular tachycardia was also present.  He was seen in the [facility] clinic in July.  He denied any resting or exertional dyspnea in his weight was noted to be decreased by 10 pounds.  Given a equivocal PYP study he was referred for a cardiac MRI especially given his conduction disease.  The patient is here with his son-in-law.  He remains at the rehabilitation center.  He is relatively asymptomatic.  He is much more conversant and engaged and is alert and oriented x3 today versus when I saw him in the hospital.  He does not do much in a day.  It seems like he is relatively sedentary.  He however denies any dyspnea on transferring from his wheelchair to his bed.  He denies any exertional angina.  He has had no presyncope or syncope.  He has required no emergency room visits or hospitalizations fortunately.  He feels relatively well.  There are some question as to how whether he is getting stronger or not.  He tells me that he is walking with a walker however his son-in-law is unsure and has never seen him doing this.  He still has left upper extremity motor limitations from his stroke.  He has no teeth and no dentures.  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  Family History  Problem Relation Age of Onset   Diabetes Neg Hx    Stroke Neg Hx     Social History   Socioeconomic History   Marital status: Married    Spouse name: Chesley Noon   Number of children: 4   Years of education: Not on file   Highest education level: High school graduate  Occupational History   Occupation: retired  Tobacco Use    Smoking status: Former    Packs/day: 1.00    Years: 10.00    Total pack years: 10.00    Types: Cigarettes    Quit date: 1998    Years since quitting: 25.7   Smokeless tobacco: Never  Vaping Use   Vaping Use: Never used  Substance and Sexual Activity   Alcohol use: Not Currently    Comment: not much, h/o heavy beer use   Drug use: No   Sexual activity: Not on file  Other Topics Concern   Not on file  Social History Narrative   Not on file   Social Determinants of Health   Financial Resource Strain: Low Risk  (09/17/2021)   Overall Financial Resource Strain (CARDIA)    Difficulty of Paying Living Expenses: Not hard at all  Food Insecurity: No Food Insecurity (09/17/2021)   Hunger Vital Sign    Worried About Running Out of Food in the Last Year: Never true    [place] in the Last Year: Never true  Transportation Needs: No Transportation Needs (09/17/2021)   PRAPARE - Hydrologist (Medical): No    Lack of Transportation (Non-Medical): No  Physical Activity: Not on file  Stress: Not on file  Social Connections: Not on file  Intimate Partner Violence: Not on file     Prior to Admission medications   Medication Sig Start Date End Date Taking? Authorizing Provider  acetaminophen (TYLENOL) 325 MG tablet Take 2 tablets (650 mg total) by mouth every 4 (four) hours as needed for mild pain (or temp > 37.5 C (99.5 F)). 05/09/21   Elgergawy, Silver Huguenin, MD  allopurinol (ZYLOPRIM) 300 MG tablet Take 300 mg by mouth daily. Patient not taking: Reported on 01/04/2022    [provider]  atorvastatin (LIPITOR) 80 MG tablet Take 1 tablet (80 mg total) by mouth daily. 06/22/21   Angiulli, Lavon Paganini, PA-C  cholecalciferol (VITAMIN D3) 25 MCG (1000 UNIT) tablet Take 1,000 Units by mouth daily.    [provider]  cloNIDine (CATAPRES) 0.2 MG tablet Take 1 tablet (0.2 mg total) by mouth 3 (three) times daily. 06/22/21   Angiulli, Lavon Paganini, PA-C   clopidogrel (PLAVIX) 75 MG tablet Take 1 tablet (75 mg total) by mouth daily. 05/10/21   Elgergawy, Silver Huguenin, MD  feeding supplement, GLUCERNA SHAKE, (GLUCERNA SHAKE) LIQD Take 237 mLs by mouth 2 (two) times daily between meals.    [provider]  ferrous sulfate 325 (65 FE) MG tablet Take 1 tablet (325 mg total) by mouth 2 (two) times daily with a meal. 09/18/21   Nita Sells, MD  furosemide (LASIX) 20 MG tablet Take 1 tablet (20 mg total) by mouth daily. 09/25/21   Lyda Jester M, PA-C  hydrALAZINE (APRESOLINE) 100 MG tablet Take 100 mg by mouth 3 (three) times daily.    [provider]  insulin detemir (LEVEMIR) 100 UNIT/ML injection Inject 0.12 mLs (  12 Units total) into the skin daily. Patient taking differently: Inject 10-22 Units into the skin See admin instructions. Inject 22 units subcutaneous at 0800, then inject 10 units subcutaneous at 2000 per [facility] 07/02/21   Angiulli, Lavon Paganini, PA-C  lisinopril (ZESTRIL) 40 MG tablet Take 1 tablet (40 mg total) by mouth daily. 12/05/19   [name], NP  losartan (COZAAR) 25 MG tablet Take 0.5 tablets (12.5 mg total) by mouth at bedtime. 01/04/22 07/03/22  Rafael Bihari, FNP  Multiple Vitamin (MULTIVITAMIN WITH MINERALS) TABS tablet Take 1 tablet by mouth daily. 05/10/21   Elgergawy, Silver Huguenin, MD  ondansetron (ZOFRAN) 4 MG tablet Take 4 mg by mouth every 8 (eight) hours as needed. 08/10/21   [provider]  potassium chloride SA (KLOR-CON M) 20 MEQ tablet Take 1 tablet (20 mEq total) by mouth daily. 09/25/21   Lyda Jester M, PA-C    No Known Allergies  REVIEW OF SYSTEMS:  General: no fevers/chills/night sweats Eyes: no blurry vision, diplopia, or amaurosis ENT: no sore throat or hearing loss Resp: no cough, wheezing, or hemoptysis CV: no edema or palpitations GI: no abdominal pain, nausea, vomiting, diarrhea, or constipation GU: no dysuria, frequency, or hematuria Skin: no rash Neuro: no  headache, numbness, tingling, or weakness of extremities Musculoskeletal: no joint pain or swelling Heme: no bleeding, DVT, or easy bruising Endo: no polydipsia or polyuria  BP 132/64   Pulse 70   SpO2 96%   PHYSICAL EXAM: GEN:  AO x 3 in no acute distress HEENT: normal Dentition: Lacking all teeth Neck: JVP normal. +1 carotid upstrokes without bruits. No thyromegaly. Lungs: equal expansion, clear bilaterally CV: Apex is discrete and nondisplaced, RRR with 3/6 crescendo decrescendo murmur Abd: soft, non-tender, non-distended; no bruit; positive bowel sounds Ext: no edema, ecchymoses, or cyanosis Vascular: 2+ femoral pulses, 2+ radial pulses       Skin: warm and dry without rash Neuro: Left upper extremity weakness    DATA AND STUDIES:  EKG: May 2023 sinus rhythm with first-degree AV block and occasional PACs   2D ECHO: May 2023 ejection fraction of 55 to 60% with severe aortic stenosis with a peak velocity of 4.6 m/s, mean gradient of 39 mmHg, and aortic valve area of 0.88 cm with no other significant valvular abnormalities   CARDIAC CATH: n/a   STS RISK CALCULATOR:  Procedure: AV Replacement   Risk of Mortality: 2.427% Renal Failure: 8.820% Permanent Stroke: 1.955% Prolonged Ventilation: 12.005% DSW Infection: 0.286% Reoperation: 3.715% Morbidity or Mortality: 18.385% Short Length of Stay: 18.033% Long Length of Stay: 10.931%   NHYA CLASS: 1    ASSESSMENT AND PLAN:   Nonrheumatic aortic valve stenosis - Plan: ECHOCARDIOGRAM COMPLETE  History of CVA (cerebrovascular accident) - Plan: ECHOCARDIOGRAM COMPLETE  Type 2 diabetes mellitus with complication, with long-term current use of insulin (HCC) - Plan: ECHOCARDIOGRAM COMPLETE  Hypertension associated with diabetes ([place]) - Plan: ECHOCARDIOGRAM COMPLETE  Hyperlipidemia associated with type 2 diabetes mellitus ([place]) - Plan: ECHOCARDIOGRAM COMPLETE  Had a long conversation with the patient and his  son-in-law regarding his stage C1 asymptomatic severe aortic stenosis.  He seems like a better candidate for an aortic valve intervention now than when I first met him a few months ago.  It seems like he is relatively sedentary and due to his neurologic deficits continues to be relatively immobile.  I think for now we should continue to monitor.  The best that is immediate family come for his next  appointment.  The patient was interested in an intervention if needed.  We will continue to have discussions moving forward.   I have personally reviewed the patients imaging data as summarized above.  I have reviewed the natural history of aortic stenosis with the patient and family members who are present today. We have discussed the limitations of medical therapy and the poor prognosis associated with symptomatic aortic stenosis. We have also reviewed potential treatment options, including palliative medical therapy, conventional surgical aortic valve replacement, and transcatheter aortic valve replacement. We discussed treatment options in the context of this patient's specific comorbid medical conditions.   All of the patient's questions were answered today. Will make further recommendations based on the results of studies outlined above.   Total time spent with patient today 60 minutes. This includes reviewing records, evaluating the patient and coordinating care.   Early Osmond, MD  01/07/2022 2:38 PM    [facility] [address], [place], [place]  [address] Phone: [phone]; Fax: [phone]

## 2022-03-11 ENCOUNTER — Encounter: Payer: Self-pay | Admitting: Adult Health

## 2022-03-11 ENCOUNTER — Ambulatory Visit (INDEPENDENT_AMBULATORY_CARE_PROVIDER_SITE_OTHER): Payer: Medicare HMO | Admitting: Adult Health

## 2022-03-11 VITALS — BP 168/79 | HR 67 | Ht 66.0 in | Wt 208.0 lb

## 2022-03-11 DIAGNOSIS — I639 Cerebral infarction, unspecified: Secondary | ICD-10-CM

## 2022-03-11 NOTE — Progress Notes (Signed)
PATIENT: Manuel Chapman DOB: April 05, 1946  REASON FOR VISIT: follow up HISTORY FROM: patient PRIMARY NEUROLOGIST: Dr. Leonie Man  Chief Complaint  Patient presents with   Follow-up    Pt in 4  Pt here for Stroke f/u  Pt  has  left side weakness  Pt in wheelchair today Pt states does walk with walker      HISTORY OF PRESENT ILLNESS: Today 03/11/22:   Manuel Chapman is a 76 year old male with a history of right PICA infarct.  He returns today for follow-up.  He reports that he has not had any additional strokelike symptoms.  He has been following with cardiology for aortic valve stenosis.  He continues to reside at [facility].  He would like to go back to his home but unsure that this is going to happen.  Continues to have limited mobility in the left upper extremity.  In a wheelchair today.  Remains on Plavix.  08/31/21: Manuel Chapman is a 76 year old male with a history of right PICA infarct.  He returns today for follow-up.  Patient reports he is currently at [facility]. He is unsure if its just for rehab or if its permenant.   Discharged on ASA and Plavix. Now he is just on Plavix. LDL was169 and lipitor 80 mg. Agreed to 30 day cardiac monitor but this has not been done.   Denies any stroke like symptoms. He feels that he has no residual symptoms. Reports pain in the left arm and left leg when it moves. Only for seconds. He is doing PT/OT/ST at [facility] place.   Came to the visit today alone.   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 fib. LDL 169 HgbA1c 9.9 VTE prophylaxis - SCDs aspirin 81 mg daily prior to admission, now on aspirin 81 mg daily and clopidogrel 75 mg daily DAPT for 3 weeks and then Plavix alone.   HISTORY (copied from Dr. Erlinda Hong) 76 year old male with history of diabetes, hypertension, gout, stroke admitted for dizziness, nausea vomiting.   In 11/2019 patient was admitted for ICH and IVH, however further MRI showed had stroke at left frontal parietal, left CR and right temporal punctate infarcts with likely hemorrhagic transformation instead of ICH.  CTA head and neck unremarkable.  EF 55 to 60%.  LDL 90, A1c 13.3.  Put on aspirin 81 and Lipitor 20, discharge to SNF.   On current admission CT showed right cerebellar infarct, repeat CT stable.  MRI showed right PICA infarct with patient ventricles.  Carotid Doppler negative.  EF 40 to 45%.  CTA head and neck showed right ICA siphon mild to moderate stenosis.  UDS negative.  LDL 169, A1c 9.9.  Patient admitted not compliant with statin medication at home.  Creatinine 0.79   On exam, patient sitting in chair, no family at bedside.  Awake alert, orientated x3.  No aphasia, follows simple commands, able to name and repeat, however moderate dysarthria due to poor denture.  Moving all extremities symmetrically, sensation symmetrical, finger-to-nose and heel-to-shin grossly intact, no ataxia.   Etiology for patient stroke likely embolic pattern given no severe vascular abnormality at right vertebral artery.  However, patient does have multiple uncontrolled risk factors including diabetes and hyperlipidemia.  Patient declined loop recorder but okay with  30-day cardiac event monitoring as outpatient.  Recommend aspirin 81 and Plavix 75 DAPT for 3 weeks and then Plavix alone.  Continue Lipitor 80.  Aggressive stroke risk factor modification.  PT/OT recommend CIR  REVIEW OF SYSTEMS: Out of a complete 14 system review of symptoms, the patient complains only of the following symptoms, and all other reviewed systems are negative.  ALLERGIES: No Known Allergies  HOME MEDICATIONS: Outpatient Medications Prior to Visit  Medication Sig Dispense Refill   acetaminophen (TYLENOL) 325 MG tablet Take 2 tablets (650 mg total) by mouth every 4 (four) hours as needed for mild pain (or temp > 37.5 C (99.5 F)).     atorvastatin (LIPITOR) 80 MG tablet Take 1 tablet (80 mg total) by mouth daily.     cholecalciferol (VITAMIN D3) 25 MCG (1000 UNIT) tablet Take 1,000 Units by mouth daily.     cloNIDine (CATAPRES) 0.2 MG tablet Take 1 tablet (0.2 mg total) by mouth 3 (three) times daily.     clopidogrel (PLAVIX) 75 MG tablet Take 1 tablet (75 mg total) by mouth daily.     feeding supplement, GLUCERNA SHAKE, (GLUCERNA SHAKE) LIQD Take 237 mLs by mouth 2 (two) times daily between meals.     ferrous sulfate 325 (65 FE) MG tablet Take 1 tablet (325 mg total) by mouth 2 (two) times daily with a meal.  3   furosemide (LASIX) 20 MG tablet Take 1 tablet (20 mg total) by mouth daily. 90 tablet 3   hydrALAZINE (APRESOLINE) 100 MG tablet Take 100 mg by mouth 3 (three) times daily.     insulin detemir (LEVEMIR) 100 UNIT/ML injection Inject 0.12 mLs (12 Units total) into the skin daily. (Patient taking differently: Inject 10-22 Units into the skin See admin instructions. Inject 22 units subcutaneous at 0800, then inject 10 units subcutaneous at 2000 per MAR) 10 mL 11   losartan (COZAAR) 25 MG tablet Take 0.5 tablets (12.5 mg total) by mouth at bedtime. 45 tablet 1   Multiple Vitamin (MULTIVITAMIN WITH MINERALS) TABS tablet Take 1 tablet by mouth daily.     ondansetron (ZOFRAN) 4 MG  tablet Take 4 mg by mouth every 8 (eight) hours as needed.     potassium chloride SA (KLOR-CON M) 20 MEQ tablet Take 1 tablet (20 mEq total) by mouth daily. 90 tablet 3   No facility-administered medications prior to visit.    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 No pertinent surgical history.  FAMILY HISTORY: Family History  Problem Relation Age of Onset   Diabetes Neg Hx    Stroke Neg Hx     SOCIAL HISTORY: Social History   Socioeconomic History  Marital status: Married    Spouse name: Chesley Noon   Number of children: 4   Years of education: Not on file   Highest education level: High school graduate  Occupational History   Occupation: retired  Tobacco Use   Smoking status: Former    Packs/day: 1.00    Years: 10.00    Total pack years: 10.00    Types: Cigarettes    Quit date: 1998    Years since quitting: 25.8   Smokeless tobacco: Never  Vaping Use   Vaping Use: Never used  Substance and Sexual Activity   Alcohol use: Not Currently    Comment: not much, h/o heavy beer use   Drug use: No   Sexual activity: Not on file  Other Topics Concern   Not on file  Social History Narrative   Not on file   Social Determinants of Health   Financial Resource Strain: Low Risk  (09/17/2021)   Overall Financial Resource Strain (CARDIA)    Difficulty of Paying Living Expenses: Not hard at all  Food Insecurity: No Food Insecurity (09/17/2021)   Hunger Vital Sign    Worried About Running Out of Food in the Last Year: Never true    [place] in the Last Year: Never true   Transportation Needs: No Transportation Needs (09/17/2021)   PRAPARE - Hydrologist (Medical): No    Lack of Transportation (Non-Medical): No  Physical Activity: Not on file  Stress: Not on file  Social Connections: Not on file  Intimate Partner Violence: Not on file      PHYSICAL EXAM  Vitals:   03/11/22 1400  BP: (!) 168/79  Pulse: 67  Weight: 208 lb (94.3 kg)  Height: 5\' 6"  (1.676 m)   Body mass index is 33.57 kg/m.  Generalized: Well developed, in no acute distress   Neurological examination  Mentation: Alert oriented to time, place, history taking. Follows all commands speech mildly dysarthric Cranial nerve II-XII: Pupils were equal round reactive to light. Extraocular movements were full, visual field were full on confrontational test. Facial sensation and strength were normal. Uvula tongue midline. Head turning and shoulder shrug  were normal and symmetric. Motor: The motor testing reveals 5 over 5 strength in all extremities with exception of 4/5 strength in the left upper extremities.  With limited range of motion in the left upper extremity. Sensory: Sensory testing is intact to soft touch on all 4 extremities. No evidence of extinction is noted.  Coordination: Cerebellar testing reveals good finger-nose-finger and heel-to-shin bilaterally.  Gait and station patient in a wheelchair today.   DIAGNOSTIC DATA (LABS, IMAGING, TESTING) - I reviewed patient records, labs, notes, testing and imaging myself where available.  Lab Results  Component Value Date   WBC 7.1 09/15/2021   HGB 9.4 (L) 09/15/2021   HCT 29.1 (L) 09/15/2021   MCV 90.4 09/15/2021   PLT 266 09/15/2021      Component Value Date/Time   NA 140 01/04/2022 1448   K 4.5 01/04/2022 1448   CL 112 (H) 01/04/2022 1448   CO2 21 (L) 01/04/2022 1448   GLUCOSE 124 (H) 01/04/2022 1448   BUN 18 01/04/2022 1448   CREATININE 1.01 01/04/2022 1448   CALCIUM 8.5 (L) 01/04/2022 1448    PROT 6.6 11/02/2021 1425   ALBUMIN 3.2 (L) 11/02/2021 1425   AST 29 11/02/2021 1425   ALT 27 11/02/2021 1425   ALKPHOS 124 11/02/2021 1425   BILITOT  0.8 11/02/2021 1425   GFRNONAA >60 01/04/2022 1448   GFRAA >60 12/04/2019 0253   Lab Results  Component Value Date   CHOL 218 (H) 05/06/2021   HDL 37 (L) 05/06/2021   LDLCALC 169 (H) 05/06/2021   TRIG 58 05/06/2021   CHOLHDL 5.9 05/06/2021   Lab Results  Component Value Date   HGBA1C 9.9 (H) 05/06/2021   No results found for: "VITAMINB12" Lab Results  Component Value Date   TSH 2.085 09/15/2021      ASSESSMENT AND PLAN 76 y.o. 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 here with:   Right PICA infarct, embolic pattern but patient does have multiple uncontrolled stroke risk factors, source unclear  Remain on plavix Maintain good control of BP LDL goal <70-continue Lipitor managed by PCP HgbA1c goal <6.5%-managed by PCP Continue follow-up with cardiology Patient advised that if he has any strokelike symptoms he should call 911 or go to the emergency room immediately Follow-up with our office on an as-needed basis    Butch Penny, MSN, NP-C 03/11/2022, 2:04 PM [facility] [address] [place], [place] [address] [phone]

## 2022-03-15 ENCOUNTER — Encounter (HOSPITAL_COMMUNITY): Payer: Medicare HMO | Admitting: Internal Medicine

## 2022-03-22 ENCOUNTER — Telehealth (HOSPITAL_COMMUNITY): Payer: Self-pay | Admitting: *Deleted

## 2022-03-26 ENCOUNTER — Telehealth (HOSPITAL_COMMUNITY): Payer: Self-pay | Admitting: Emergency Medicine

## 2022-03-26 NOTE — Telephone Encounter (Signed)
Reaching out to patient to offer assistance regarding upcoming cardiac imaging study; pt verbalizes understanding of appt date/time, parking situation and where to check in, pre-test NPO status and medications ordered, and verified current allergies; name and call back number provided for further questions should they arise Manuel Alexandria RN Navigator Cardiac Imaging [facility] [phone] office [phone] cell  Pt coming from [facility]- spoke with nurse who reports that transport will bring patient for 830 arrival. Denies metal implants

## 2022-03-29 ENCOUNTER — Encounter (HOSPITAL_COMMUNITY): Payer: Self-pay | Admitting: Family Medicine

## 2022-03-29 ENCOUNTER — Ambulatory Visit (HOSPITAL_COMMUNITY)
Admission: RE | Admit: 2022-03-29 | Discharge: 2022-03-29 | Disposition: A | Discharge status: Home / Self Care | Payer: Medicare HMO | Source: Ambulatory Visit | Attending: Family Medicine | Admitting: Family Medicine

## 2022-03-29 ENCOUNTER — Other Ambulatory Visit (HOSPITAL_COMMUNITY): Payer: Self-pay | Admitting: Family Medicine

## 2022-03-29 ENCOUNTER — Encounter (HOSPITAL_COMMUNITY): Payer: Self-pay | Admitting: Adult Health

## 2022-03-29 DIAGNOSIS — I5032 Chronic diastolic (congestive) heart failure: Secondary | ICD-10-CM | POA: Insufficient documentation

## 2022-03-29 MED ORDER — GADOBUTROL 1 MMOL/ML IV SOLN
10.0000 mL | Freq: Once | INTRAVENOUS | Status: AC | PRN
  Administered 2022-03-29: 10 mL via INTRAVENOUS

## 2022-04-07 ENCOUNTER — Ambulatory Visit (HOSPITAL_COMMUNITY): Payer: Medicare HMO | Attending: Internal Medicine

## 2022-04-07 DIAGNOSIS — Z794 Long term (current) use of insulin: Secondary | ICD-10-CM | POA: Diagnosis not present

## 2022-04-07 DIAGNOSIS — I352 Nonrheumatic aortic (valve) stenosis with insufficiency: Secondary | ICD-10-CM | POA: Diagnosis not present

## 2022-04-07 DIAGNOSIS — E1159 Type 2 diabetes mellitus with other circulatory complications: Secondary | ICD-10-CM | POA: Diagnosis not present

## 2022-04-07 DIAGNOSIS — I152 Hypertension secondary to endocrine disorders: Secondary | ICD-10-CM | POA: Diagnosis not present

## 2022-04-07 DIAGNOSIS — Z8673 Personal history of transient ischemic attack (TIA), and cerebral infarction without residual deficits: Secondary | ICD-10-CM | POA: Diagnosis not present

## 2022-04-07 DIAGNOSIS — Z87891 Personal history of nicotine dependence: Secondary | ICD-10-CM | POA: Insufficient documentation

## 2022-04-07 DIAGNOSIS — E1169 Type 2 diabetes mellitus with other specified complication: Secondary | ICD-10-CM | POA: Diagnosis not present

## 2022-04-07 DIAGNOSIS — E785 Hyperlipidemia, unspecified: Secondary | ICD-10-CM | POA: Insufficient documentation

## 2022-04-07 DIAGNOSIS — I350 Nonrheumatic aortic (valve) stenosis: Secondary | ICD-10-CM | POA: Diagnosis not present

## 2022-04-07 DIAGNOSIS — E118 Type 2 diabetes mellitus with unspecified complications: Secondary | ICD-10-CM

## 2022-04-07 LAB — ECHOCARDIOGRAM COMPLETE
AR max vel: 0.86 cm2
AV Area VTI: 0.79 cm2
AV Area mean vel: 0.79 cm2
AV Mean grad: 28.5 mmHg
AV Peak grad: 53.1 mmHg
Ao pk vel: 3.65 m/s
Area-P 1/2: 4.74 cm2
S' Lateral: 3.8 cm

## 2022-04-20 NOTE — Progress Notes (Incomplete)
ADVANCED HF CLINIC NOTE  PCP: Lorene Dy, MD Primary Cardiologist: Dr. Ali Lowe HF Cardiologist: Dr. 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 Resides at [facility].    Seen in [facility] 5/23 for diarrhea and hematochezia. While being triaged, heart rate dropped from 60 with first-degree block to possible type II Mobitz block in the 30s. Cardiology consulted and recommended admit for monitoring and K replacement. AV nodal blocking agents were discontinued.  Bradycardia improved off AV nodal blocking agents.Echo showed mildly reduced EF, 45-50%, GIIDD, mildly reduced RV and severe thickening of the aortic valve w/ severe aortic stenosis, with peak gradient of 65.35mHg and mean transvalvular gradient of 30.946mg. The patient's dimensionless index is 0.21 and calculated aortic valve area is 0.58cm. Structural heart team was consulted and being considered for possible TAVR. Outpatient monitor arranged to assess for further bradycardia. [facility]   [facility] amyloid work up initiated. PYP scan was equivocal. Visual and quantitative assessment (grade 1, H/CL = 1.24). Reviewed by Dr. BeHaroldine Lawsho also agreed that study is equivocal and additional imaging is needed. Multiple Myeloma panel showed no M spike protein.    Heart monitor (6/23) showed HR ranging from 38-103 bpm, w/ an avg of 60 bpm. No afib detected. + brief NSVT and occasional second-degree type 1 block and 2nd degree type II block noted during nocturnal hours.    Today [facility] for HF follow up, [facility]. [facility] He is able to transfer out of his WC  without SOB, says he can stand and walk for short distances. He is here with his son in law who says he moves very little and requires a lot of assistance. Denies palpitations, CP, dizziness, edema, or PND/Orthopnea. Appetite ok. No fever or chills. Taking all medications provided by facility.   cMRI 9/23: LVEF 37% Moderate LVH. RV EF 40%. Possible functional bicuspid aortic valve, suspect significant stenosis. Small pericardial effusion. LGE in the inferolateral and anterolateral walls suggestive of prior MI. There was mid-wall LGE at the anterior and inferior RV insertion sites. This is nonspecific and can be suggestive of pressure/volume overload.  [facility] No CP or SOB. No edema. Walking with a walker. No dizziness.      Cardiac Testing    - PYP Scan (6/23): Visual and quantitative assessment (grade 1, H/CL = 1.24) is equivocal for transthyretin amyloidosis.    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     atorvastatin (LIPITOR) 80 MG tablet Take 1 tablet (80 mg total) by mouth daily.     cholecalciferol (VITAMIN D3) 25 MCG (1000 UNIT) tablet Take 1,000 Units by mouth daily.     cloNIDine (CATAPRES) 0.2 MG tablet Take 1 tablet (0.2 mg total) by mouth 3 (three) times  daily.     clopidogrel (PLAVIX) 75 MG tablet Take 1 tablet (75 mg total) by mouth daily.     ferrous sulfate 325 (65 FE) MG tablet Take 1 tablet (325 mg total) by mouth 2 (two) times daily with a meal.  3   furosemide (LASIX) 20 MG tablet Take 1 tablet (20 mg total) by mouth daily. 90 tablet 3   hydrALAZINE (APRESOLINE) 100 MG tablet Take 100 mg by mouth 3 (three) times daily.     insulin detemir (LEVEMIR) 100 UNIT/ML injection Inject 0.12 mLs (12 Units total) into the skin daily. (Patient taking differently: Inject 10-22 Units into the skin See admin instructions. 12 units daily) 10 mL 11   losartan (COZAAR) 25 MG tablet Take 0.5 tablets (12.5 mg total) by mouth at bedtime. 45 tablet 1   Multiple Vitamin (MULTIVITAMIN WITH MINERALS) TABS tablet Take 1 tablet by mouth daily.     ondansetron (ZOFRAN) 4 MG tablet Take 4 mg by mouth every 8 (eight) hours as needed.     potassium chloride SA (KLOR-CON M) 20 MEQ tablet Take 1 tablet (20 mEq total) by mouth daily. 90 tablet 3   feeding supplement, GLUCERNA SHAKE, (GLUCERNA SHAKE) LIQD Take 237 mLs by mouth 2 (two) times daily between meals.     No current facility-administered medications for this encounter.   No Known Allergies  Social History   Socioeconomic History   Marital status: Married    Spouse name: Chesley Noon   Number of children: 4   Years of education: Not on file   Highest education level: High school graduate  Occupational History   Occupation: retired  Tobacco Use   Smoking status: Former    Packs/day: 1.00    Years: 10.00    Total pack years: 10.00    Types: Cigarettes    Quit date: 1998    Years since quitting: 26.0   Smokeless tobacco: Never  Vaping Use   Vaping Use: Never used  Substance and Sexual Activity   Alcohol use: Not Currently    Comment: not much, h/o heavy beer use   Drug use: No   Sexual activity: Not on file  Other Topics Concern   Not on file  Social History Narrative   Not on file    Social Determinants of Health   Financial Resource Strain: Low Risk  (09/17/2021)   Overall Financial Resource Strain (CARDIA)    Difficulty of Paying Living Expenses: Not hard at all  Food Insecurity: No Food Insecurity (09/17/2021)   Hunger Vital Sign    Worried About Running Out of Food in the Last Year: Never true    [place] in the Last Year: Never true  Transportation Needs: No Transportation Needs (09/17/2021)   PRAPARE - Hydrologist (Medical): No    Lack of Transportation (Non-Medical): No  Physical Activity: Not on file  Stress: Not on file  Social Connections: Not on file  Intimate Partner Violence: Not on file   Family History  Problem Relation Age of Onset   Diabetes Neg  Hx    Stroke Neg Hx    BP 130/86   Pulse 79   SpO2 98%   Wt Readings from Last 3 Encounters:  03/11/22 94.3 kg (208 lb)  01/04/22 92.9 kg (204 lb 12.8 oz)  11/02/21 97 kg (213 lb 12.8 oz)   PHYSICAL EXAM: General:  NAD. No resp difficulty, arrived in [facility] HEENT: normal Neck: supple. JVP 7  Carotids 2+ bilat; no bruits. No lymphadenopathy or thryomegaly appreciated. Cor: PMI nondisplaced. Regular rate & rhythm. 3/6 AS  s2 preserved  Lungs: clear Abdomen: soft, nontender, nondistended. No hepatosplenomegaly. No bruits or masses. Good bowel sounds. Extremities: no cyanosis, clubbing, rash, 3+ edema Neuro: alert & orientedx3, cranial nerves grossly intact. Weak on left side    ECG: SR with 1st AVB, 70 bpm, PR 280 msec (personally reviewed).  ASSESSMENT & PLAN: Combined Systolic and Diastolic Heart Failure - Echo 5/23 EF 45-50%, GIIDD, mildly reduced RV - W/ concomitant conduction disease w/ known AV block and severe AS, ? Amyloid. PYP Scan was done 6/23 and visual and quantitative assessment (grade 1, H/CL = 1.24) is equivocal for transthyretin amyloidosis.  - cMRI 9/23: LVEF 37% Moderate LVH. RV EF 40%. Possible functional bicuspid aortic valve, suspect  significant stenosis. Small pericardial effusion. LGE in the inferolateral and anterolateral walls suggestive of prior MI. There was mid-wall LGE at the anterior and inferior RV insertion sites. This is nonspecific and can be suggestive of pressure/volume overload. - Multiple Myeloma Panel no M-spike protein  - NYHA Class III, confounded by physical deconditioning/recent stroke  - Marked volume overload  - Switch losartan to Entresto 49/51 bid - Continue hydralazine 100 tid. Eventually add nitrates - Increase Lasix 40 mg daily.  - Place compression hose  - See back in 1-2 weeks with NP to reassess volume status - Add MRA/SGLT2i as tolerated - Off AVN blockers with heart block  - W/u for amyloid negative to date. LVH likely related to HTN  - Repeat echo - Decreased clonidine to 0.1 bid  2. Aortic Stenosis  - Severe by Echo 5/23, peak gradient of 65.2 mmHg and mean transvalvular gradient of 30.9 mmHg. Dimensionless index is 0.21 and calculated aortic valve area is 0.58 cm. - Denies CP, dyspnea, syncope.  - On exam AS appears more moderate as s2 well preserved - Repeat echo  - Dr. Ali Lowe following for possible TAVR. ? Candidacy with limited functional capacity.   3. Bradycardia  - type II Mobitz block in the 30s recent hospitalization - Improved w/ discontinuation of AV nodal blockers. - 30 day monitor (6/23): some bradycardia, avg HR 60s and 2nd degree type II block noted during nocturnal hours - No current indication for PPM. NSR on ECG today. - Continue to avoid AV nodal blockers  - Amyloid w/u per above.    4. H/o CVA - He initially deferred loop at time of diagnosis for afib surveillance - 30 day monitor showed no Afib.  - Residual LUE weakness, resides at SNF  - Continue Plavix + statin - BP well controlled  5. HTN - BP well controlled but with LV dyfunction would like to get off clonidine  - Med changes as above   Glori Bickers, MD  10:46 AM

## 2022-04-21 ENCOUNTER — Ambulatory Visit (HOSPITAL_COMMUNITY)
Admission: RE | Admit: 2022-04-21 | Discharge: 2022-04-21 | Disposition: A | Discharge status: Home / Self Care | Payer: Medicare HMO | Source: Ambulatory Visit | Attending: Internal Medicine | Admitting: Internal Medicine

## 2022-04-21 ENCOUNTER — Telehealth: Payer: Self-pay | Admitting: Internal Medicine

## 2022-04-21 VITALS — BP 130/86 | HR 79

## 2022-04-21 DIAGNOSIS — Z7902 Long term (current) use of antithrombotics/antiplatelets: Secondary | ICD-10-CM | POA: Insufficient documentation

## 2022-04-21 DIAGNOSIS — Z8679 Personal history of other diseases of the circulatory system: Secondary | ICD-10-CM | POA: Insufficient documentation

## 2022-04-21 DIAGNOSIS — Z79899 Other long term (current) drug therapy: Secondary | ICD-10-CM | POA: Insufficient documentation

## 2022-04-21 DIAGNOSIS — I252 Old myocardial infarction: Secondary | ICD-10-CM | POA: Insufficient documentation

## 2022-04-21 DIAGNOSIS — I110 Hypertensive heart disease with heart failure: Secondary | ICD-10-CM | POA: Insufficient documentation

## 2022-04-21 DIAGNOSIS — Z8673 Personal history of transient ischemic attack (TIA), and cerebral infarction without residual deficits: Secondary | ICD-10-CM | POA: Diagnosis not present

## 2022-04-21 DIAGNOSIS — I350 Nonrheumatic aortic (valve) stenosis: Secondary | ICD-10-CM | POA: Diagnosis not present

## 2022-04-21 DIAGNOSIS — I5032 Chronic diastolic (congestive) heart failure: Secondary | ICD-10-CM | POA: Diagnosis not present

## 2022-04-21 DIAGNOSIS — Z794 Long term (current) use of insulin: Secondary | ICD-10-CM | POA: Diagnosis not present

## 2022-04-21 DIAGNOSIS — R531 Weakness: Secondary | ICD-10-CM | POA: Insufficient documentation

## 2022-04-21 DIAGNOSIS — E1159 Type 2 diabetes mellitus with other circulatory complications: Secondary | ICD-10-CM

## 2022-04-21 DIAGNOSIS — I3139 Other pericardial effusion (noninflammatory): Secondary | ICD-10-CM | POA: Insufficient documentation

## 2022-04-21 DIAGNOSIS — R001 Bradycardia, unspecified: Secondary | ICD-10-CM

## 2022-04-21 DIAGNOSIS — I152 Hypertension secondary to endocrine disorders: Secondary | ICD-10-CM

## 2022-04-21 DIAGNOSIS — I5040 Unspecified combined systolic (congestive) and diastolic (congestive) heart failure: Secondary | ICD-10-CM | POA: Diagnosis present

## 2022-04-21 DIAGNOSIS — E119 Type 2 diabetes mellitus without complications: Secondary | ICD-10-CM | POA: Insufficient documentation

## 2022-04-21 LAB — BASIC METABOLIC PANEL
Anion gap: 6 (ref 5–15)
BUN: 15 mg/dL (ref 8–23)
CO2: 23 mmol/L (ref 22–32)
Calcium: 8.3 mg/dL — ABNORMAL LOW (ref 8.9–10.3)
Chloride: 111 mmol/L (ref 98–111)
Creatinine, Ser: 1 mg/dL (ref 0.61–1.24)
GFR, Estimated: >60 mL/min (ref >60)
Glucose, Bld: 128 mg/dL — ABNORMAL HIGH (ref 70–99)
Potassium: 4.1 mmol/L (ref 3.5–5.1)
Sodium: 140 mmol/L (ref 135–145)

## 2022-04-21 LAB — BRAIN NATRIURETIC PEPTIDE: B Natriuretic Peptide: 475 pg/mL — ABNORMAL HIGH (ref 0.0–100.0)

## 2022-04-21 MED ORDER — ENTRESTO 24-26 MG PO TABS: 1.0000 | ORAL_TABLET | Freq: Two times a day (BID) | ORAL | 3 refills | Status: DC

## 2022-04-21 MED ORDER — CLONIDINE HCL 0.1 MG PO TABS: 0.1000 mg | ORAL_TABLET | Freq: Three times a day (TID) | ORAL | Status: DC

## 2022-04-21 MED ORDER — FUROSEMIDE 20 MG PO TABS: 40.0000 mg | ORAL_TABLET | Freq: Every day | ORAL | 3 refills | Status: DC

## 2022-04-21 NOTE — Patient Instructions (Signed)
Following orders sent back to [facility] with patient:  Stop Losartan Start Entresto 24/26 mg Twice daily  Increase Furosemide to 40 mg Daily Decrease Clonidine to 0.1 mg Three times a day  Wear compression hose daily Labs done today (bmp, bnp) Follow-up in 2-3 weeks with an echocardiogram (Friday 05/07/22 11 am=echo, 12pm =provider)

## 2022-04-21 NOTE — Telephone Encounter (Signed)
Pt is returning call in regards to results. Requesting return call.  

## 2022-04-21 NOTE — Telephone Encounter (Signed)
Called Pt back per message received in [facility] triage.  Pt did not answer my call.  Unable to leave voicemail message. Test result follow up still required.

## 2022-04-22 NOTE — Telephone Encounter (Signed)
Call placed to number listed for call back. Voicemail not set up. Call placed to patient but there was no answer and no voicemail

## 2022-04-28 ENCOUNTER — Telehealth (HOSPITAL_COMMUNITY): Payer: Self-pay | Admitting: *Deleted

## 2022-04-29 NOTE — Telephone Encounter (Signed)
I spoke w [facility]) and adv that Dr. Ali Lowe req family to be present at the appointment.    The primary phone number was no longer valid and DPR Christy Sartorius asks that he be contacted as the patient is in a skilled facility.  He will make sure the facility knows about his appointment.

## 2022-05-03 ENCOUNTER — Inpatient Hospital Stay: Payer: Medicare HMO | Attending: Hematology and Oncology | Admitting: Hematology and Oncology

## 2022-05-03 ENCOUNTER — Encounter: Payer: Self-pay | Admitting: Hematology and Oncology

## 2022-05-03 ENCOUNTER — Other Ambulatory Visit: Payer: Self-pay

## 2022-05-03 ENCOUNTER — Inpatient Hospital Stay: Payer: Medicare HMO

## 2022-05-03 VITALS — BP 186/67 | HR 71 | Temp 98.2°F | Resp 18 | Ht 66.0 in | Wt 205.0 lb

## 2022-05-03 DIAGNOSIS — Z79899 Other long term (current) drug therapy: Secondary | ICD-10-CM | POA: Insufficient documentation

## 2022-05-03 DIAGNOSIS — E119 Type 2 diabetes mellitus without complications: Secondary | ICD-10-CM | POA: Insufficient documentation

## 2022-05-03 DIAGNOSIS — I110 Hypertensive heart disease with heart failure: Secondary | ICD-10-CM | POA: Diagnosis not present

## 2022-05-03 DIAGNOSIS — Z8673 Personal history of transient ischemic attack (TIA), and cerebral infarction without residual deficits: Secondary | ICD-10-CM | POA: Diagnosis not present

## 2022-05-03 DIAGNOSIS — D509 Iron deficiency anemia, unspecified: Secondary | ICD-10-CM | POA: Diagnosis present

## 2022-05-03 DIAGNOSIS — Z87891 Personal history of nicotine dependence: Secondary | ICD-10-CM | POA: Diagnosis not present

## 2022-05-03 DIAGNOSIS — I509 Heart failure, unspecified: Secondary | ICD-10-CM | POA: Insufficient documentation

## 2022-05-03 DIAGNOSIS — Z794 Long term (current) use of insulin: Secondary | ICD-10-CM | POA: Insufficient documentation

## 2022-05-03 DIAGNOSIS — D500 Iron deficiency anemia secondary to blood loss (chronic): Secondary | ICD-10-CM

## 2022-05-03 DIAGNOSIS — Z7902 Long term (current) use of antithrombotics/antiplatelets: Secondary | ICD-10-CM | POA: Insufficient documentation

## 2022-05-03 LAB — BASIC METABOLIC PANEL - CANCER CENTER ONLY
Anion gap: 4 — ABNORMAL LOW (ref 5–15)
BUN: 14 mg/dL (ref 8–23)
CO2: 30 mmol/L (ref 22–32)
Calcium: 8.5 mg/dL — ABNORMAL LOW (ref 8.9–10.3)
Chloride: 105 mmol/L (ref 98–111)
Creatinine: 0.93 mg/dL (ref 0.61–1.24)
GFR, Estimated: >60 mL/min (ref >60)
Glucose, Bld: 110 mg/dL — ABNORMAL HIGH (ref 70–99)
Potassium: 4.2 mmol/L (ref 3.5–5.1)
Sodium: 139 mmol/L (ref 135–145)

## 2022-05-03 LAB — CBC WITH DIFFERENTIAL (CANCER CENTER ONLY)
Abs Immature Granulocytes: 0.03 10*3/uL (ref 0.00–0.07)
Basophils Absolute: 0 10*3/uL (ref 0.0–0.1)
Basophils Relative: 0 %
Eosinophils Absolute: 0.1 10*3/uL (ref 0.0–0.5)
Eosinophils Relative: 2 %
HCT: 30.1 % — ABNORMAL LOW (ref 39.0–52.0)
Hemoglobin: 10 g/dL — ABNORMAL LOW (ref 13.0–17.0)
Immature Granulocytes: 1 %
Lymphocytes Relative: 28 %
Lymphs Abs: 1.6 10*3/uL (ref 0.7–4.0)
MCH: 28.2 pg (ref 26.0–34.0)
MCHC: 33.2 g/dL (ref 30.0–36.0)
MCV: 84.8 fL (ref 80.0–100.0)
Monocytes Absolute: 0.4 10*3/uL (ref 0.1–1.0)
Monocytes Relative: 6 %
Neutro Abs: 3.6 10*3/uL (ref 1.7–7.7)
Neutrophils Relative %: 63 %
Platelet Count: 331 10*3/uL (ref 150–400)
RBC: 3.55 MIL/uL — ABNORMAL LOW (ref 4.22–5.81)
RDW: 16 % — ABNORMAL HIGH (ref 11.5–15.5)
WBC Count: 5.7 10*3/uL (ref 4.0–10.5)
nRBC: 0 % (ref 0.0–0.2)

## 2022-05-03 LAB — RETICULOCYTES
Immature Retic Fract: 8.1 % (ref 2.3–15.9)
RBC.: 3.51 MIL/uL — ABNORMAL LOW (ref 4.22–5.81)
Retic Count, Absolute: 45.6 10*3/uL (ref 19.0–186.0)
Retic Ct Pct: 1.3 % (ref 0.4–3.1)

## 2022-05-03 LAB — IRON AND IRON BINDING CAPACITY (CC-WL,HP ONLY)
Iron: 39 ug/dL — ABNORMAL LOW (ref 45–182)
Saturation Ratios: 16 % — ABNORMAL LOW (ref 17.9–39.5)
TIBC: 238 ug/dL — ABNORMAL LOW (ref 250–450)
UIBC: 199 ug/dL (ref 117–376)

## 2022-05-03 LAB — DIRECT ANTIGLOBULIN TEST (NOT AT ARMC)
DAT, IgG: NEGATIVE
DAT, complement: NEGATIVE

## 2022-05-03 LAB — FERRITIN: Ferritin: 83 ng/mL (ref 24–336)

## 2022-05-03 LAB — VITAMIN B12: Vitamin B-12: 964 pg/mL — ABNORMAL HIGH (ref 180–914)

## 2022-05-03 LAB — ABO/RH: ABO/RH(D): O POS

## 2022-05-03 NOTE — Progress Notes (Signed)
[facility] CONSULT NOTE  Patient Care Team: Burton Apley, MD as PCP - General (Internal Medicine)  ASSESSMENT & PLAN:  Iron deficiency anemia The patient was referred here to without repeat blood work His last blood count showed anemia and his last iron studies in May 2023 confirm iron deficiency The patient is on chronic antiplatelet agents I did not see that he had EGD and colonoscopy done in the past I will plan to repeat iron studies and other blood work and plan to see him again in 2 weeks If confirmed iron deficiency anemia despite oral iron supplement, we will schedule intravenous iron infusion along with referral to gastroenterologist for management and evaluation of GI blood loss Orders Placed This Encounter  Procedures   Iron and Iron Binding Capacity ([facility] only)    Standing Status:   Future    Number of Occurrences:   1    Standing Expiration Date:   05/04/2023   Ferritin    Standing Status:   Future    Number of Occurrences:   1    Standing Expiration Date:   05/03/2023   CBC with Differential (Cancer Center Only)    Standing Status:   Future    Number of Occurrences:   1    Standing Expiration Date:   05/04/2023   Reticulocytes    Standing Status:   Future    Number of Occurrences:   1    Standing Expiration Date:   05/04/2023   Vitamin B12    Standing Status:   Future    Number of Occurrences:   1    Standing Expiration Date:   05/04/2023   Erythropoietin    Standing Status:   Future    Number of Occurrences:   1    Standing Expiration Date:   05/04/2023   Basic Metabolic Panel - Cancer Center Only    Standing Status:   Future    Number of Occurrences:   1    Standing Expiration Date:   05/04/2023   Folate RBC    Standing Status:   Future    Number of Occurrences:   1    Standing Expiration Date:   05/03/2023   ABO/Rh    Standing Status:   Future    Number of Occurrences:   1    Standing Expiration Date:   05/04/2023   Direct antiglobulin test  (not at [facility])    Standing Status:   Future    Number of Occurrences:   1    Standing Expiration Date:   05/04/2023    All questions were answered. The patient knows to call the clinic with any problems, questions or concerns.  The total time spent in the appointment was 60 minutes encounter with patients including review of chart and various tests results, discussions about plan of care and coordination of care plan  Artis Delay, MD 1/8/20243:06 PM   CHIEF COMPLAINTS/PURPOSE OF CONSULTATION:  Anemia  HISTORY OF PRESENTING ILLNESS:  Zyad Boomer 77 y.o. male is here because of anemia  He was found to have abnormal CBC from blood work drawn last year The patient is a poor historian I have reviewed his scanned records I did not see any repeat blood count done The patient was admitted to the hospital after suffering from stroke and he has been placed in a skilled nursing facility since then He is a poor historian According to scanned records, the patient remains on antiplatelet agent  along with other medication for his congestive heart failure and aortic stenosis He denies recent chest pain on exertion, shortness of breath on minimal exertion, pre-syncopal episodes, or palpitations. He had not noticed any recent bleeding such as epistaxis, hematuria or hematochezia The patient denies over the counter NSAID ingestion. His last colonoscopy was unknown He had no prior history or diagnosis of cancer. His age appropriate screening programs are up-to-date. He denies any pica and eats a variety of diet. 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 No pertinent surgical history.  SOCIAL HISTORY: Social History   Socioeconomic History   Marital status: Married    Spouse name: Chesley Noon   Number of children: 4   Years of education: Not on file   Highest education level: High school graduate  Occupational History   Occupation: retired  Tobacco Use   Smoking status: Former    Packs/day: 1.00    Years: 10.00    Total pack years: 10.00    Types: Cigarettes    Quit date: 1998    Years since quitting: 26.0   Smokeless tobacco: Never  Vaping Use   Vaping Use: Never used  Substance and Sexual Activity   Alcohol use: Not Currently    Comment: not much, h/o heavy beer use   Drug use: No   Sexual activity: Not on file  Other Topics Concern   Not on file  Social History Narrative   Not on file   Social Determinants of Health   Financial Resource Strain: Low Risk  (09/17/2021)   Overall Financial Resource Strain (CARDIA)    Difficulty of Paying Living Expenses: Not hard at all  Food Insecurity: No Food Insecurity (09/17/2021)   Hunger Vital Sign    Worried About Running Out of Food in the Last Year: Never true    [place] in the Last Year: Never true  Transportation Needs: No Transportation Needs (09/17/2021)   PRAPARE - Hydrologist (Medical): No    Lack of Transportation (Non-Medical): No  Physical Activity: Not on file  Stress: Not on file  Social Connections: Not on file  Intimate Partner Violence: Not on file    FAMILY HISTORY: Family History  Problem Relation Age of Onset   Diabetes Neg Hx    Stroke Neg Hx     ALLERGIES:  has No Known Allergies.  MEDICATIONS:  Current Outpatient Medications  Medication Sig Dispense Refill   acetaminophen (TYLENOL) 325 MG tablet  Take 2 tablets (650 mg total) by mouth every 4 (four) hours as needed for mild pain (or temp > 37.5 C (99.5 F)).     atorvastatin (LIPITOR) 80 MG tablet Take 1 tablet (80 mg total) by mouth daily.     cholecalciferol (VITAMIN D3) 25 MCG (1000 UNIT) tablet Take 5,000 Units by mouth daily.     cloNIDine (CATAPRES) 0.1 MG tablet Take 1 tablet (0.1 mg total) by mouth 3 (three) times  daily.     clopidogrel (PLAVIX) 75 MG tablet Take 1 tablet (75 mg total) by mouth daily.     feeding supplement, GLUCERNA SHAKE, (GLUCERNA SHAKE) LIQD Take 237 mLs by mouth 2 (two) times daily between meals.     ferrous sulfate 325 (65 FE) MG tablet Take 1 tablet (325 mg total) by mouth 2 (two) times daily with a meal.  3   furosemide (LASIX) 20 MG tablet Take 2 tablets (40 mg total) by mouth daily. 90 tablet 3   hydrALAZINE (APRESOLINE) 100 MG tablet Take 100 mg by mouth 3 (three) times daily.     insulin detemir (LEVEMIR) 100 UNIT/ML injection Inject 0.12 mLs (12 Units total) into the skin daily. (Patient taking differently: Inject 10-22 Units into the skin See admin instructions. 12 units daily) 10 mL 11   Multiple Vitamin (MULTIVITAMIN WITH MINERALS) TABS tablet Take 1 tablet by mouth daily.     ondansetron (ZOFRAN) 4 MG tablet Take 4 mg by mouth every 8 (eight) hours as needed.     potassium chloride SA (KLOR-CON M) 20 MEQ tablet Take 1 tablet (20 mEq total) by mouth daily. 90 tablet 3   sacubitril-valsartan (ENTRESTO) 24-26 MG Take 1 tablet by mouth 2 (two) times daily. 60 tablet 3   No current facility-administered medications for this visit.    REVIEW OF SYSTEMS:   Constitutional: Denies fevers, chills or abnormal night sweats Eyes: Denies blurriness of vision, double vision or watery eyes Ears, nose, mouth, throat, and face: Denies mucositis or sore throat Respiratory: Denies cough, dyspnea or wheezes Cardiovascular: Denies palpitation, chest discomfort or lower extremity swelling Gastrointestinal:  Denies  nausea, heartburn or change in bowel habits Skin: Denies abnormal skin rashes Lymphatics: Denies new lymphadenopathy or easy bruising Neurological:Denies numbness, tingling or new weaknesses Behavioral/Psych: Mood is stable, no new changes  All other systems were reviewed with the patient and are negative.  PHYSICAL EXAMINATION: ECOG PERFORMANCE STATUS: 2 - Symptomatic, <50% confined to bed  Vitals:   05/03/22 1414  BP: (!) 186/67  Pulse: 71  Resp: 18  Temp: 98.2 F (36.8 C)  SpO2: 100%   Filed Weights   05/03/22 1414  Weight: 205 lb (93 kg)    GENERAL:alert, no distress and comfortable SKIN: skin color, texture, turgor are normal, no rashes or significant lesions EYES: normal, conjunctiva are pink and non-injected, sclera clear OROPHARYNX:no exudate, no erythema and lips, buccal mucosa, and tongue normal  NECK: supple, thyroid normal size, non-tender, without nodularity LYMPH:  no palpable lymphadenopathy in the cervical, axillary or inguinal LUNGS: clear to auscultation and percussion with normal breathing effort HEART: Regular rate and rhythm, loud ejection systolic murmur on the left sternal border and no lower extremity edema ABDOMEN:abdomen soft, non-tender and normal bowel sounds Musculoskeletal:no cyanosis of digits and no clubbing  PSYCH: alert & oriented x 3 with fluent speech NEURO: He has left sided weakness.  Poor venous access is noted.  Poor memory is noted  RADIOGRAPHIC STUDIES: I have personally reviewed the radiological images as listed and agreed with the findings in the report. ECHOCARDIOGRAM COMPLETE  Result Date: 04/07/2022    ECHOCARDIOGRAM REPORT   Patient Name:   COPE MARTE Date of Exam: 04/07/2022 Medical Rec #:  [id number]             Height:       66.0 in Accession #:    [id number]            Weight:  208.0 lb Date of Birth:  May 31, 1945             BSA:          2.033 m Patient Age:    59 years              BP:           132/64 mmHg  Patient Gender: M                     HR:           86 bpm. Exam Location:  [address] Procedure: 2D Echo, Cardiac Doppler and Color Doppler Indications:    I35.0 AS  History:        Patient has prior history of Echocardiogram examinations, most                 recent 09/16/2021. Stroke, AS; Risk Factors:Hypertension,                 Diabetes, Dyslipidemia and Former Smoker.  Sonographer:    Samule Ohm RDCS Referring Phys: [id number] Orbie Pyo IMPRESSIONS  1. Left ventricular ejection fraction, by estimation, is 60 to 65%. The left ventricle has normal function. The left ventricle has no regional wall motion abnormalities. There is mild concentric left ventricular hypertrophy. Left ventricular diastolic parameters are consistent with Grade II diastolic dysfunction (pseudonormalization). Elevated left ventricular end-diastolic pressure.  2. Right ventricular systolic function is normal. The right ventricular size is moderately enlarged.  3. Left atrial size was mildly dilated.  4. Right atrial size was mild to moderately dilated.  5. The mitral valve is normal in structure. Trivial mitral valve regurgitation. No evidence of mitral stenosis.  6. The aortic valve is normal in structure. There is moderate calcification of the aortic valve. There is moderate thickening of the aortic valve. Aortic valve regurgitation is trivial. Severe aortic valve stenosis. Aortic valve area, by VTI measures 0.79 cm. Aortic valve mean gradient measures 28.5 mmHg. Aortic valve Vmax measures 3.64 m/s. Although the mean AVG is only , the SVI is low at 32 and DVI is low at 0.25 c/w paradoxical low flow low gradient severe aortic stenosis.  7. Compared to study dated 09/16/21, the DVI has decreased from 0.38 to 0.25, VMax has increased from 3.60m/s to 3.6 m/s, SVI has decreased from 42 to 32 and AVA has decreased from 0.88cm2 (VTI) to 0.79cm2 (VTI). FINDINGS  Left Ventricle: Left ventricular ejection fraction, by estimation, is  60 to 65%. The left ventricle has normal function. The left ventricle has no regional wall motion abnormalities. The left ventricular internal cavity size was normal in size. There is  mild concentric left ventricular hypertrophy. Left ventricular diastolic parameters are consistent with Grade II diastolic dysfunction (pseudonormalization). Elevated left ventricular end-diastolic pressure. Right Ventricle: The right ventricular size is moderately enlarged. No increase in right ventricular wall thickness. Right ventricular systolic function is normal. Left Atrium: Left atrial size was mildly dilated. Right Atrium: Right atrial size was mild to moderately dilated. Pericardium: There is no evidence of pericardial effusion. Mitral Valve: The mitral valve is normal in structure. Mild mitral annular calcification. Trivial mitral valve regurgitation. No evidence of mitral valve stenosis. Tricuspid Valve: The tricuspid valve is normal in structure. Tricuspid valve regurgitation is mild . No evidence of tricuspid stenosis. Aortic Valve: The aortic valve is normal in structure. There is moderate calcification of the aortic valve. There is moderate thickening of the aortic  valve. Aortic valve regurgitation is trivial. Severe aortic stenosis is present. Aortic valve mean gradient measures 28.5 mmHg. Aortic valve peak gradient measures 53.1 mmHg. Aortic valve area, by VTI measures 0.79 cm. Pulmonic Valve: The pulmonic valve was normal in structure. Pulmonic valve regurgitation is mild. No evidence of pulmonic stenosis. Aorta: The aortic root is normal in size and structure. Venous: The inferior vena cava was not well visualized. IAS/Shunts: No atrial level shunt detected by color flow Doppler.  LEFT VENTRICLE PLAX 2D LVIDd:         5.10 cm   Diastology LVIDs:         3.80 cm   LV e' medial:    6.85 cm/s LV PW:         1.20 cm   LV E/e' medial:  18.0 LV IVS:        1.20 cm   LV e' lateral:   5.87 cm/s LVOT diam:     2.00 cm   LV  E/e' lateral: 21.0 LV SV:         66 LV SV Index:   32 LVOT Area:     3.14 cm  RIGHT VENTRICLE            IVC RV S prime:     7.62 cm/s  IVC diam: 1.10 cm TAPSE (M-mode): 1.0 cm RVSP:           35.7 mmHg LEFT ATRIUM             Index        RIGHT ATRIUM           Index LA diam:        4.50 cm 2.21 cm/m   RA Pressure: 3.00 mmHg LA Vol (A2C):   71.0 ml 34.92 ml/m  RA Area:     22.70 cm LA Vol (A4C):   71.2 ml 35.01 ml/m  RA Volume:   80.20 ml  39.44 ml/m LA Biplane Vol: 75.8 ml 37.28 ml/m  AORTIC VALVE AV Area (Vmax):    0.86 cm AV Area (Vmean):   0.79 cm AV Area (VTI):     0.79 cm AV Vmax:           364.50 cm/s AV Vmean:          249.000 cm/s AV VTI:            0.834 m AV Peak Grad:      53.1 mmHg AV Mean Grad:      28.5 mmHg LVOT Vmax:         99.40 cm/s LVOT Vmean:        62.500 cm/s LVOT VTI:          0.209 m LVOT/AV VTI ratio: 0.25  AORTA Ao Root diam: 2.90 cm Ao Asc diam:  3.10 cm MITRAL VALVE                TRICUSPID VALVE MV Area (PHT): 4.74 cm     TR Peak grad:   32.7 mmHg MV Decel Time: 160 msec     TR Vmax:        286.00 cm/s MV E velocity: 123.00 cm/s  Estimated RAP:  3.00 mmHg MV A velocity: 138.00 cm/s  RVSP:           35.7 mmHg MV E/A ratio:  0.89  SHUNTS                             Systemic VTI:  0.21 m                             Systemic Diam: 2.00 cm Armanda Magic MD Electronically signed by Armanda Magic MD Signature Date/Time: 04/07/2022/11:46:49 AM    Final

## 2022-05-03 NOTE — Assessment & Plan Note (Signed)
The patient was referred here to without repeat blood work His last blood count showed anemia and his last iron studies in May 2023 confirm iron deficiency The patient is on chronic antiplatelet agents I did not see that he had EGD and colonoscopy done in the past I will plan to repeat iron studies and other blood work and plan to see him again in 2 weeks If confirmed iron deficiency anemia despite oral iron supplement, we will schedule intravenous iron infusion along with referral to gastroenterologist for management and evaluation of GI blood loss

## 2022-05-04 LAB — FOLATE RBC
Folate, Hemolysate: 331 ng/mL
Folate, RBC: 1130 ng/mL (ref >498)
Hematocrit: 29.3 % — ABNORMAL LOW (ref 37.5–51.0)

## 2022-05-04 LAB — ERYTHROPOIETIN: Erythropoietin: 17.1 m[IU]/mL (ref 2.6–18.5)

## 2022-05-05 NOTE — Progress Notes (Signed)
ADVANCED HF CLINIC NOTE  PCP: Lorene Dy, MD Primary Cardiologist: Dr. Ali Lowe HF Cardiologist: Dr. 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 Resides at [facility].    Seen in [facility] 5/23 for diarrhea and hematochezia. While being triaged, heart rate dropped from 60 with first-degree block to possible type II Mobitz block in the 30s. Cardiology consulted and recommended admit for monitoring and K replacement. AV nodal blocking agents were discontinued.  Bradycardia improved off AV nodal blocking agents.Echo showed mildly reduced EF, 45-50%, GIIDD, mildly reduced RV and severe thickening of the aortic valve w/ severe aortic stenosis, with peak gradient of 65.5mHg and mean transvalvular gradient of 30.968mg. The patient's dimensionless index is 0.21 and calculated aortic valve area is 0.58cm. Structural heart team was consulted and being considered for possible TAVR. Outpatient monitor arranged to assess for further bradycardia. Discharged back to SNF. [facility]   [facility] amyloid work up initiated. PYP scan was equivocal. Visual and quantitative assessment (grade 1, H/CL = 1.24). Reviewed by Dr. BeHaroldine Lawsho also agreed that study is equivocal and additional imaging is needed. Multiple Myeloma panel showed no M spike protein.    Heart monitor (6/23) showed HR ranging from 38-103 bpm, w/ an avg of 60 bpm. No afib detected. + brief NSVT and occasional second-degree type 1 block and 2nd degree type II block noted during nocturnal hours.    cMRI 9/23: LVEF 37% Moderate LVH. RV EF 40%. Possible functional bicuspid aortic valve, suspect significant stenosis. Small pericardial effusion. LGE in  the inferolateral and anterolateral walls suggestive of prior MI. There was mid-wall LGE at the anterior and inferior RV insertion sites. This is nonspecific and can be suggestive of pressure/volume overload.  Echo (12/23) showed EF 60-65%, mild LVH, grade II DD< RV normal, severe AS Aortic valve area, by VTI  0.79 cm. AoV mean gradient 28.5 mmHg. Aortic valve Vmax 3.64 m/s. Although the mean AVG is only 2887m, the SVI is low at 32 and DVI is low at 0.25 c/w paradoxical low flow low gradient severe  aortic stenosis.   Today he returns for HF follow up with his daughter and Son-in-law. Overall feeling fine. No SOB transferring out of WC. Legs swelling some. [facility] much anymore. Denies palpitations, abnormal bleeding, CP, dizziness, or PND/Orthopnea. Appetite ok. No fever or chills. Taking all medications provided at facility. He is continent, no wounds.   Cardiac Testing  - Echo (12/23):  EF 60-65%, mild LVH, grade II DD< RV normal, severe AS Aortic valve area, by VTI  0.79 cm. AoV mean gradient 28.5 mmHg. Aortic valve Vmax 3.64 m/s. Although the mean AVG is only 41m73m the SVI is low at 32 and DVI is low at 0.25 c/w paradoxical low flow low gradient severe aortic stenosis.   - PYP Scan (6/23): Visual and quantitative assessment (grade 1, H/CL = 1.24) is equivocal for transthyretin amyloidosis.    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     atorvastatin (LIPITOR) 80 MG tablet Take 1 tablet (80 mg total) by mouth daily.     cholecalciferol (VITAMIN D3) 25 MCG (1000 UNIT) tablet Take 5,000 Units by mouth daily.     cloNIDine (CATAPRES) 0.1 MG tablet Take 1 tablet (0.1 mg total) by mouth 3 (three) times daily.     clopidogrel (PLAVIX) 75 MG tablet Take 1 tablet (75 mg total) by mouth daily.     feeding supplement, GLUCERNA SHAKE, (GLUCERNA SHAKE) LIQD Take 237 mLs by mouth 2 (two) times daily between meals.     ferrous sulfate 325 (65 FE) MG tablet Take 1 tablet (325 mg total) by mouth 2 (two) times daily with a meal.  3   furosemide (LASIX) 20 MG tablet Take 2 tablets (40 mg total) by mouth daily. (Patient taking differently: Take 20 mg by mouth daily.) 90 tablet 3   hydrALAZINE (APRESOLINE) 100 MG tablet Take 100 mg by mouth 3 (three) times daily.     insulin detemir (LEVEMIR) 100 UNIT/ML injection Inject 0.12 mLs (12 Units total) into the skin daily. (Patient taking differently: Inject 14 Units into the skin daily. 12 units daily) 10 mL 11   Multiple Vitamin (MULTIVITAMIN WITH MINERALS) TABS tablet Take 1 tablet by mouth daily.     ondansetron (ZOFRAN) 4 MG tablet Take 4 mg by mouth every 8 (eight) hours as needed.     potassium chloride SA (KLOR-CON M) 20 MEQ tablet Take 1 tablet (20 mEq total) by mouth daily. 90 tablet 3   sacubitril-valsartan (ENTRESTO) 24-26 MG Take 1 tablet by mouth 2 (two) times daily. 60 tablet 3   No current facility-administered medications for this encounter.   No Known Allergies  Social History   Socioeconomic History   Marital status: Married    Spouse name: Chesley Noon   Number of children: 4   Years of education: Not on file   Highest education level: High school graduate  Occupational History    Occupation: retired  Tobacco Use   Smoking status: Former    Packs/day: 1.00    Years: 10.00    Total pack years: 10.00    Types: Cigarettes    Quit date: 1998    Years since quitting: 26.0   Smokeless tobacco: Never  Vaping Use   Vaping Use: Never used  Substance and Sexual Activity   Alcohol use: Not Currently    Comment: not much, h/o heavy beer use   Drug use: No   Sexual activity: Not on file  Other Topics Concern   Not on file  Social History Narrative   Not on file   Social Determinants of Health   Financial Resource Strain: Low Risk  (09/17/2021)   Overall Financial Resource Strain (CARDIA)    Difficulty of Paying Living Expenses: Not hard at all  Food Insecurity: No Food Insecurity (09/17/2021)   Hunger Vital Sign    Worried About Running Out  of Food in the Last Year: Never true    [place] in the Last Year: Never true  Transportation Needs: No Transportation Needs (09/17/2021)   PRAPARE - Hydrologist (Medical): No    Lack of Transportation (Non-Medical): No  Physical Activity: Not on file  Stress: Not on file  Social Connections: Not on file  Intimate Partner Violence: Not on file   Family History  Problem Relation Age of Onset   Diabetes Neg Hx    Stroke Neg Hx    BP (!) 140/80   Pulse 86   Wt 90.3 kg (199 lb)   SpO2 98%   BMI 32.12 kg/m   Wt Readings from Last 3 Encounters:  05/07/22 90.3 kg (199 lb)  05/03/22 93 kg (205 lb)  03/11/22 94.3 kg (208 lb)   PHYSICAL EXAM: General:  NAD. No resp difficulty, arrived in [facility] HEENT: Normal Neck: Supple. JVP to jaw. Carotids 2+ bilat; no bruits. No lymphadenopathy or thryomegaly appreciated. Cor: PMI nondisplaced. Regular rate & rhythm. No rubs, gallops, 3/6 AS audible S2 Lungs: Clear Abdomen: Soft, nontender, nondistended. No hepatosplenomegaly. No bruits or masses. Good bowel sounds. Extremities: No cyanosis, clubbing, rash, 1-2+ BLE pretibial LE edema Neuro: Alert &  oriented x 3, cranial nerves grossly intact. Moves all 4 extremities w/o difficulty; + LUE weakness. Affect pleasant.  ASSESSMENT & PLAN: Chronic Combined Systolic and Diastolic Heart Failure - Echo 5/23 EF 45-50%, GIIDD, mildly reduced RV - W/ concomitant conduction disease w/ known AV block and severe AS, ? Amyloid. PYP Scan was done 6/23 and visual and quantitative assessment (grade 1, H/CL = 1.24) is equivocal for transthyretin amyloidosis.  - cMRI 9/23: LVEF 37% Moderate LVH. RV EF 40%. Possible functional bicuspid aortic valve, suspect significant stenosis. Small pericardial effusion. LGE in the inferolateral and anterolateral walls suggestive of prior MI. There was mid-wall LGE at the anterior and inferior RV insertion sites. This is nonspecific and can be suggestive of pressure/volume overload. - Multiple Myeloma Panel no M-spike protein  - NYHA Class III, confounded by physical deconditioning/recent stroke. Volume better today but still overloaded. - Start Jardiance 10 mg daily. Discussed potential SE - Increase Entresto to 49/51 mg bid. - Stop clonidine. - Continue hydralazine 100 tid. Eventually add nitrates - Continue Lasix 20 mg daily.  - Place compression hose. - Add MRA as tolerated. - Off AVN blockers with heart block  - W/u for amyloid negative to date. LVH likely related to HTN  - Labs today, repeat BMET ion 10-14 days.  2. Aortic Stenosis  - Echo 12/23 showed AoV mean gradient 28.5 mmHg. Aortic valve Vmax 3.64 m/s. Although the mean AVG is only 1mmHg, the SVI is low at 32 and DVI is low at 0.25 c/w paradoxical low flow low gradient severe aortic stenosis.  - Severe by Echo 5/23, peak gradient of 65.2 mmHg and mean transvalvular gradient of 30.9 mmHg. Dimensionless index is 0.21 and calculated aortic valve area is 0.58 cm. - Denies CP, dyspnea, syncope.  - On exam, AS appears more moderate as s2 well preserved - Dr. Ali Lowe following for possible TAVR. ? Candidacy with  limited functional capacity.   3. Bradycardia  - type II Mobitz block in the 30s recent hospitalization - Improved w/ discontinuation of AV nodal blockers. - 30 day monitor (6/23): some bradycardia, avg HR 60s and 2nd degree type II block noted during nocturnal hours - No current indication for PPM. Regular on  exam today. - Continue to avoid AV nodal blockers  - Amyloid w/u per above.    4. H/o CVA - He initially deferred loop at time of diagnosis for afib surveillance - 30 day monitor showed no Afib.  - Residual LUE weakness, resides at SNF  - Continue Plavix + statin - BP well controlled  5. HTN - BP well controlled, but with LV dyfunction would like to get off clonidine.  - Med changes as above.  Follow up in 6 weeks with APP (check BP and fluid) and 3 months with Dr. Gala Romney.  Jacklynn Ganong, FNP  12:34 PM

## 2022-05-07 ENCOUNTER — Ambulatory Visit (HOSPITAL_COMMUNITY): Payer: Medicare HMO

## 2022-05-07 ENCOUNTER — Encounter (HOSPITAL_COMMUNITY): Payer: Self-pay

## 2022-05-07 ENCOUNTER — Ambulatory Visit (HOSPITAL_COMMUNITY)
Admission: RE | Admit: 2022-05-07 | Discharge: 2022-05-07 | Disposition: A | Discharge status: Home / Self Care | Payer: Medicare HMO | Source: Ambulatory Visit | Attending: Family Medicine | Admitting: Family Medicine

## 2022-05-07 VITALS — BP 140/80 | HR 86 | Wt 199.0 lb

## 2022-05-07 DIAGNOSIS — E1159 Type 2 diabetes mellitus with other circulatory complications: Secondary | ICD-10-CM

## 2022-05-07 DIAGNOSIS — Z7902 Long term (current) use of antithrombotics/antiplatelets: Secondary | ICD-10-CM | POA: Insufficient documentation

## 2022-05-07 DIAGNOSIS — R001 Bradycardia, unspecified: Secondary | ICD-10-CM | POA: Diagnosis not present

## 2022-05-07 DIAGNOSIS — Z8673 Personal history of transient ischemic attack (TIA), and cerebral infarction without residual deficits: Secondary | ICD-10-CM | POA: Insufficient documentation

## 2022-05-07 DIAGNOSIS — I3139 Other pericardial effusion (noninflammatory): Secondary | ICD-10-CM | POA: Insufficient documentation

## 2022-05-07 DIAGNOSIS — I350 Nonrheumatic aortic (valve) stenosis: Secondary | ICD-10-CM | POA: Diagnosis not present

## 2022-05-07 DIAGNOSIS — I459 Conduction disorder, unspecified: Secondary | ICD-10-CM | POA: Insufficient documentation

## 2022-05-07 DIAGNOSIS — I252 Old myocardial infarction: Secondary | ICD-10-CM | POA: Insufficient documentation

## 2022-05-07 DIAGNOSIS — I5042 Chronic combined systolic (congestive) and diastolic (congestive) heart failure: Secondary | ICD-10-CM | POA: Diagnosis not present

## 2022-05-07 DIAGNOSIS — R531 Weakness: Secondary | ICD-10-CM | POA: Insufficient documentation

## 2022-05-07 DIAGNOSIS — I110 Hypertensive heart disease with heart failure: Secondary | ICD-10-CM | POA: Diagnosis not present

## 2022-05-07 DIAGNOSIS — Z79899 Other long term (current) drug therapy: Secondary | ICD-10-CM | POA: Insufficient documentation

## 2022-05-07 DIAGNOSIS — I5032 Chronic diastolic (congestive) heart failure: Secondary | ICD-10-CM | POA: Diagnosis not present

## 2022-05-07 DIAGNOSIS — I152 Hypertension secondary to endocrine disorders: Secondary | ICD-10-CM

## 2022-05-07 LAB — BASIC METABOLIC PANEL
Anion gap: 9 (ref 5–15)
BUN: 12 mg/dL (ref 8–23)
CO2: 24 mmol/L (ref 22–32)
Calcium: 8.4 mg/dL — ABNORMAL LOW (ref 8.9–10.3)
Chloride: 106 mmol/L (ref 98–111)
Creatinine, Ser: 0.91 mg/dL (ref 0.61–1.24)
GFR, Estimated: >60 mL/min (ref >60)
Glucose, Bld: 86 mg/dL (ref 70–99)
Potassium: 3.6 mmol/L (ref 3.5–5.1)
Sodium: 139 mmol/L (ref 135–145)

## 2022-05-07 LAB — BRAIN NATRIURETIC PEPTIDE: B Natriuretic Peptide: 338.3 pg/mL — ABNORMAL HIGH (ref 0.0–100.0)

## 2022-05-07 MED ORDER — EMPAGLIFLOZIN 10 MG PO TABS: 10.0000 mg | ORAL_TABLET | Freq: Every day | ORAL | 11 refills | Status: AC

## 2022-05-07 NOTE — Patient Instructions (Signed)
The orders below were given to patient via SNF orders [facility]) -increase entresto to 49/51 mg one tab twice a day -stop clonidine -start jardiance 10 mg one tab daily  Labs today and labs needed in 10-14 days  Your physician recommends that you schedule a follow-up appointment in: 6 weeks  in the Advanced Practitioners (PA/NP) Clinic  And in 3 months with Dr Sung Amabile  Please wear your compression hose daily, place them on as soon as you get up in the morning and remove before you go to bed at night.

## 2022-05-10 ENCOUNTER — Ambulatory Visit: Payer: Medicare HMO | Attending: Internal Medicine | Admitting: Internal Medicine

## 2022-05-10 ENCOUNTER — Encounter: Payer: Self-pay | Admitting: Internal Medicine

## 2022-05-10 VITALS — BP 162/94 | HR 100 | Ht 66.0 in | Wt 195.6 lb

## 2022-05-10 DIAGNOSIS — Z794 Long term (current) use of insulin: Secondary | ICD-10-CM

## 2022-05-10 DIAGNOSIS — E1159 Type 2 diabetes mellitus with other circulatory complications: Secondary | ICD-10-CM

## 2022-05-10 DIAGNOSIS — I441 Atrioventricular block, second degree: Secondary | ICD-10-CM

## 2022-05-10 DIAGNOSIS — I350 Nonrheumatic aortic (valve) stenosis: Secondary | ICD-10-CM | POA: Diagnosis not present

## 2022-05-10 DIAGNOSIS — E118 Type 2 diabetes mellitus with unspecified complications: Secondary | ICD-10-CM | POA: Diagnosis not present

## 2022-05-10 DIAGNOSIS — I639 Cerebral infarction, unspecified: Secondary | ICD-10-CM

## 2022-05-10 DIAGNOSIS — I152 Hypertension secondary to endocrine disorders: Secondary | ICD-10-CM

## 2022-05-10 DIAGNOSIS — E785 Hyperlipidemia, unspecified: Secondary | ICD-10-CM

## 2022-05-10 DIAGNOSIS — E1169 Type 2 diabetes mellitus with other specified complication: Secondary | ICD-10-CM

## 2022-05-10 NOTE — Patient Instructions (Signed)
Medication Instructions:  No changes *If you need a refill on your cardiac medications before your next appointment, please call your pharmacy*   Lab Work: none   Testing/Procedures: ECHOCARDIOGRAM - DUE IN JULY 2024 - [place] Your physician has requested that you have an echocardiogram. Echocardiography is a painless test that uses sound waves to create images of your heart. It provides your doctor with information about the size and shape of your heart and how well your heart's chambers and valves are working. This procedure takes approximately one hour. There are no restrictions for this procedure. Please do NOT wear cologne, perfume, aftershave, or lotions (deodorant is allowed). Please arrive 15 minutes prior to your appointment time.    Follow-Up: At [facility], you and your health needs are our priority.  As part of our continuing mission to provide you with exceptional heart care, we have created designated Provider Care Teams.  These Care Teams include your primary Cardiologist (physician) and Advanced Practice Providers (APPs -  Physician Assistants and Nurse Practitioners) who all work together to provide you with the care you need, when you need it.  We recommend signing up for the patient portal called "MyChart".  Sign up information is provided on this After Visit Summary.  MyChart is used to connect with patients for Virtual Visits (Telemedicine).  Patients are able to view lab/test results, encounter notes, upcoming appointments, etc.  Non-urgent messages can be sent to your provider as well.   To learn more about what you can do with MyChart, go to [email or web address].    Your next appointment:   6 month(s)  Provider:   Lenna Sciara, MD

## 2022-05-10 NOTE — Progress Notes (Signed)
Patient ID: Manuel Chapman MRN: [id number] DOB/AGE: 79-03-1946 79 y.o.  Primary Care Physician:Roberts, Jori Moll, MD Primary Cardiologist: Manuel Sciara, MD   FOCUSED CARDIOVASCULAR PROBLEM LIST:   1.  Paradoxical low-flow low gradient aortic stenosis with a stroke-volume index of 32, DVI of 0.25, mean gradient of 28.5 mmHg, and peak velocity of 3.64 m/s with an aortic valve area of 0.79 cm; conduction demonstrates sinus rhythm with first-degree AV block 2.  History of cerebellar stroke 2023; history of subarachnoid hemorrhage and left subcortical and right temporal embolic infarct 7035 with residual left upper extremity weakness 3.  History of transient Mobitz type II/III block during admission for GI bleeding May 2023; not on nodal blocking agents 4.  Type 2 diabetes on insulin 5.  Hypertension 6.  Hyperlipidemia 7.  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 male with the indicated medical history here for hospital follow-up.  The patient was admitted in May 2023 with a possible GI bleed.  He was was relatively bradycardic with possible Mobitz type II or type III block with heart rates in the 30s.  AV nodal blocking agents were discontinued.  An echocardiogram was performed which demonstrated moderate to severe aortic stenosis.  During that evaluation it was thought that he was a marginal candidate for an aortic valve intervention and a plan was advised for him to follow-up in 3 months to assess further.  He was seen in in the [facility] clinic in June and was noted to be able to ambulate with a walker.  He was found to be mildly fluid overloaded and Lasix was started.  A monitor had been placed which demonstrated no atrial fibrillation with occasional type I and  second-degree type II block seen during nocturnal hours.  Nonsustained ventricular tachycardia was also present.  He was seen in the [facility] clinic in July.  He denied any resting or exertional dyspnea in his weight was noted to be decreased by 10 pounds.  Given a equivocal PYP study he was referred for a cardiac MRI especially given his conduction disease.  The patient is here with his son-in-law.  He remains at the rehabilitation center.  He is relatively asymptomatic.  He is much more conversant and engaged and is alert and oriented x3 today versus when I saw him in the hospital.  He does not do much in a day.  It seems like he is relatively sedentary.  He however denies any dyspnea on transferring from his wheelchair to his bed.  He denies any exertional angina.  He has had no presyncope or syncope.  He has required no emergency room visits or hospitalizations fortunately.  He feels relatively well.  There are some question as to how whether he is getting stronger or not.  He tells me that he is walking with a walker however his son-in-law is unsure and has never seen him doing this.  He still has left upper extremity motor limitations from his stroke.  He has no teeth and no dentures.  Plan: Continue to monitor symptoms and follow-up in 3 months.  Today: In the interim he was seen in the [facility] clinic in late December and was doing very well.  He is still at [employer].  He was walking with a walker  without angina, severe dyspnea, presyncope, or syncope.  He is compliant with all of his medications.  He unfortunately is here without family.  He is mostly in a wheelchair during the day.  He however denies any shortness of breath, presyncope, or exertional angina.  He is not that active on a regular basis per his own admission.  He fortunately has not required any emergency room visits or hospitalizations.  Past Medical History:  Diagnosis Date   Aortic stenosis 09/16/2021   Cognitive deficit as late  effect of multiple subcortical cerebrovascular accidents (CVAs) 09/16/2021   CVA (cerebral vascular accident) ([facility]) 2020   Diabetes mellitus    Hypertension    ICH (intracerebral hemorrhage) ([facility]) 11/2019   Iron deficiency anemia 09/16/2021   Metabolic acidosis 09/16/2021    No past surgical history on file.  Family History  Problem Relation Age of Onset   Diabetes Neg Hx    Stroke Neg Hx     Social History   Socioeconomic History   Marital status: Married    Spouse name: Manuel Chapman   Number of children: 4   Years of education: Not on file   Highest education level: High school graduate  Occupational History   Occupation: retired  Tobacco Use   Smoking status: Former    Packs/day: 1.00    Years: 10.00    Total pack years: 10.00    Types: Cigarettes    Quit date: 1998    Years since quitting: 26.0   Smokeless tobacco: Never  Vaping Use   Vaping Use: Never used  Substance and Sexual Activity   Alcohol use: Not Currently    Comment: not much, h/o heavy beer use   Drug use: No   Sexual activity: Not on file  Other Topics Concern   Not on file  Social History Narrative   Not on file   Social Determinants of Health   Financial Resource Strain: Low Risk  (09/17/2021)   Overall Financial Resource Strain (CARDIA)    Difficulty of Paying Living Expenses: Not hard at all  Food Insecurity: No Food Insecurity (09/17/2021)   Hunger Vital Sign    Worried About Running Out of Food in the Last Year: Never true    Ran Out of Food in the Last Year: Never true  Transportation Needs: No Transportation Needs (09/17/2021)   PRAPARE - Administrator, Civil Service (Medical): No    Lack of Transportation (Non-Medical): No  Physical Activity: Not on file  Stress: Not on file  Social Connections: Not on file  Intimate Partner Violence: Not on file     Prior to Admission medications   Medication Sig Start Date End Date Taking? Authorizing Provider  acetaminophen  (TYLENOL) 325 MG tablet Take 2 tablets (650 mg total) by mouth every 4 (four) hours as needed for mild pain (or temp > 37.5 C (99.5 F)). 05/09/21   Elgergawy, Leana Roe, MD  allopurinol (ZYLOPRIM) 300 MG tablet Take 300 mg by mouth daily. Patient not taking: Reported on 01/04/2022    [provider]  atorvastatin (LIPITOR) 80 MG tablet Take 1 tablet (80 mg total) by mouth daily. 06/22/21   Angiulli, Mcarthur Rossetti, PA-C  cholecalciferol (VITAMIN D3) 25 MCG (1000 UNIT) tablet Take 1,000 Units by mouth daily.    [provider]  cloNIDine (CATAPRES) 0.2 MG tablet Take 1 tablet (0.2 mg total) by mouth 3 (three) times daily. 06/22/21   Angiulli, Mcarthur Rossetti, PA-C  clopidogrel (PLAVIX) 75  MG tablet Take 1 tablet (75 mg total) by mouth daily. 05/10/21   Elgergawy, Leana Roe, MD  feeding supplement, GLUCERNA SHAKE, (GLUCERNA SHAKE) LIQD Take 237 mLs by mouth 2 (two) times daily between meals.    [provider]  ferrous sulfate 325 (65 FE) MG tablet Take 1 tablet (325 mg total) by mouth 2 (two) times daily with a meal. 09/18/21   Rhetta Mura, MD  furosemide (LASIX) 20 MG tablet Take 1 tablet (20 mg total) by mouth daily. 09/25/21   Robbie Lis M, PA-C  hydrALAZINE (APRESOLINE) 100 MG tablet Take 100 mg by mouth 3 (three) times daily.    [provider]  insulin detemir (LEVEMIR) 100 UNIT/ML injection Inject 0.12 mLs (12 Units total) into the skin daily. Patient taking differently: Inject 10-22 Units into the skin See admin instructions. Inject 22 units subcutaneous at 0800, then inject 10 units subcutaneous at 2000 per [facility] 07/02/21   Angiulli, Mcarthur Rossetti, PA-C  lisinopril (ZESTRIL) 40 MG tablet Take 1 tablet (40 mg total) by mouth daily. 12/05/19   Arline Asp, NP  losartan (COZAAR) 25 MG tablet Take 0.5 tablets (12.5 mg total) by mouth at bedtime. 01/04/22 07/03/22  Jacklynn Ganong, FNP  Multiple Vitamin (MULTIVITAMIN WITH MINERALS) TABS tablet Take 1 tablet by mouth  daily. 05/10/21   Elgergawy, Leana Roe, MD  ondansetron (ZOFRAN) 4 MG tablet Take 4 mg by mouth every 8 (eight) hours as needed. 08/10/21   [provider]  potassium chloride SA (KLOR-CON M) 20 MEQ tablet Take 1 tablet (20 mEq total) by mouth daily. 09/25/21   Robbie Lis M, PA-C    No Known Allergies  REVIEW OF SYSTEMS:  General: no fevers/chills/night sweats Eyes: no blurry vision, diplopia, or amaurosis ENT: no sore throat or hearing loss Resp: no cough, wheezing, or hemoptysis CV: no edema or palpitations GI: no abdominal pain, nausea, vomiting, diarrhea, or constipation GU: no dysuria, frequency, or hematuria Skin: no rash Neuro: no headache, numbness, tingling, or weakness of extremities Musculoskeletal: no joint pain or swelling Heme: no bleeding, DVT, or easy bruising Endo: no polydipsia or polyuria  BP (!) 162/94   Pulse 100   Ht 5\' 6"  (1.676 m)   Wt 195 lb 9.6 oz (88.7 kg)   SpO2 98%   BMI 31.57 kg/m   PHYSICAL EXAM: GEN:  AO x 3 in no acute distress HEENT: normal Dentition: Lacking all teeth Neck: JVP normal. +1 carotid upstrokes without bruits. No thyromegaly. Lungs: equal expansion, clear bilaterally CV: Apex is discrete and nondisplaced, RRR with 3/6 crescendo decrescendo murmur Abd: soft, non-tender, non-distended; no bruit; positive bowel sounds Ext: no edema, ecchymoses, or cyanosis Vascular: 2+ femoral pulses, 2+ radial pulses       Skin: warm and dry without rash Neuro: Left upper extremity weakness    DATA AND STUDIES:  EKG: May 2023 sinus rhythm with first-degree AV block and occasional PACs   2D ECHO: May 2023 ejection fraction of 55 to 60% with severe aortic stenosis with a peak velocity of 4.6 m/s, mean gradient of 39 mmHg, and aortic valve area of 0.88 cm with no other significant valvular abnormalities   CARDIAC CATH: n/a   STS RISK CALCULATOR:  Procedure: AV Replacement   Risk of Mortality: 2.427% Renal  Failure: 8.820% Permanent Stroke: 1.955% Prolonged Ventilation: 12.005% DSW Infection: 0.286% Reoperation: 3.715% Morbidity or Mortality: 18.385% Short Length of Stay: 18.033% Long Length of Stay: 10.931%   NHYA CLASS: 1  ASSESSMENT AND PLAN:   Nonrheumatic aortic valve stenosis - Plan: ECHOCARDIOGRAM COMPLETE  Acute CVA (cerebrovascular accident) ([place])  Second degree AV block  Type 2 diabetes mellitus with complication, with long-term current use of insulin ([place])  Hypertension associated with diabetes ([place])  Hyperlipidemia associated with type 2 diabetes mellitus ([place])  The patient continues to be asymptomatic with paradoxical low-flow low gradient aortic stenosis.  In this particular individual I think it makes sense to continue to monitor him.  I will see him back in 6 months with an echocardiogram.  I have asked him to come with his family next time that he sees me if possible.  I told him if he develops any shortness of breath, chest pain, or presyncope he should contact our office for an expedited appointment.  Patient agrees with this plan.  Overall I still think he is somewhat of a marginal candidate for an aortic valve intervention.  Manuel Osmond, MD  05/10/2022 9:29 AM    [address], [place], [place]  [address] Phone: [phone]; Fax: [phone]

## 2022-05-18 ENCOUNTER — Other Ambulatory Visit: Payer: Self-pay

## 2022-05-18 ENCOUNTER — Inpatient Hospital Stay (HOSPITAL_BASED_OUTPATIENT_CLINIC_OR_DEPARTMENT_OTHER): Payer: Medicare HMO | Admitting: Hematology and Oncology

## 2022-05-18 ENCOUNTER — Encounter: Payer: Self-pay | Admitting: Hematology and Oncology

## 2022-05-18 VITALS — BP 174/84 | HR 87 | Temp 97.7°F | Resp 18 | Ht 66.0 in | Wt 202.6 lb

## 2022-05-18 DIAGNOSIS — D509 Iron deficiency anemia, unspecified: Secondary | ICD-10-CM

## 2022-05-18 NOTE — Progress Notes (Signed)
[place] [facility] OFFICE PROGRESS NOTE  Lorene Dy, MD  ASSESSMENT & PLAN:  Iron deficiency anemia I reviewed multiple test results with the patient His iron studies are adequate The overall picture is most consistent with anemia of chronic illness Due to high risk of bleeding as the patient remains on chronic antiplatelet agents, I recommend he continues on iron supplement indefinitely I attempted to call his nursing home but unable to talk to the provider who referred him here He does not need long-term follow-up He would not benefit from intravenous iron infusion and he does not need darbepoetin injection  No orders of the defined types were placed in this encounter.   The total time spent in the appointment was 20 minutes encounter with patients including review of chart and various tests results, discussions about plan of care and coordination of care plan   All questions were answered. The patient knows to call the clinic with any problems, questions or concerns. No barriers to learning was detected.    Heath Lark, MD 1/23/20243:37 PM  INTERVAL HISTORY: Manuel Chapman 77 y.o. male returns for review of test results He was seen several weeks ago for evaluation of anemia I discussed test results with him.  The patient has poor and limited understanding of the overall reason for consult.  I wrote down recommendation on the piece of paper to accommodate the patient back to the skilled nursing facility  SUMMARY OF HEMATOLOGIC HISTORY:  He was found to have abnormal CBC from blood work drawn last year The patient is a poor historian I have reviewed his scanned records I did not see any repeat blood count done The patient was admitted to the hospital after suffering from stroke and he has been placed in a skilled nursing facility since then He is a poor historian According to scanned records, the patient remains on antiplatelet agent along with other  medication for his congestive heart failure and aortic stenosis He denies recent chest pain on exertion, shortness of breath on minimal exertion, pre-syncopal episodes, or palpitations. He had not noticed any recent bleeding such as epistaxis, hematuria or hematochezia The patient denies over the counter NSAID ingestion. His last colonoscopy was unknown He had no prior history or diagnosis of cancer. His age appropriate screening programs are up-to-date. He denies any pica and eats a variety of diet. He never donated blood or received blood transfusion The patient was prescribed oral iron supplements and he takes iron supplement twice a day for many months The patient has extensive blood work in January 2024, overall impression is the patient have anemia chronic illness  I have reviewed the past medical history, past surgical history, social history and family history with the patient and they are unchanged from previous note.  ALLERGIES:  has No Known Allergies.  MEDICATIONS:  Current Outpatient Medications  Medication Sig Dispense Refill   acetaminophen (TYLENOL) 325 MG tablet Take 2 tablets (650 mg total) by mouth every 4 (four) hours as needed for mild pain (or temp > 37.5 C (99.5 F)).     atorvastatin (LIPITOR) 80 MG tablet Take 1 tablet (80 mg total) by mouth daily.     cholecalciferol (VITAMIN D3) 25 MCG (1000 UNIT) tablet Take 5,000 Units by mouth daily.     clopidogrel (PLAVIX) 75 MG tablet Take 1 tablet (75 mg total) by mouth daily.     empagliflozin (JARDIANCE) 10 MG TABS tablet Take 1 tablet (10 mg total) by mouth daily  before breakfast. 30 tablet 11   ENTRESTO 49-51 MG Take 1 tablet by mouth 2 (two) times daily.     feeding supplement, GLUCERNA SHAKE, (GLUCERNA SHAKE) LIQD Take 237 mLs by mouth 2 (two) times daily between meals.     ferrous sulfate 325 (65 FE) MG tablet Take 1 tablet (325 mg total) by mouth 2 (two) times daily with a meal.  3   furosemide (LASIX) 20 MG tablet  Take 2 tablets (40 mg total) by mouth daily. (Patient taking differently: Take 20 mg by mouth daily.) 90 tablet 3   hydrALAZINE (APRESOLINE) 100 MG tablet Take 100 mg by mouth 3 (three) times daily.     insulin detemir (LEVEMIR) 100 UNIT/ML injection Inject 0.12 mLs (12 Units total) into the skin daily. (Patient taking differently: Inject 14 Units into the skin daily. 12 units daily) 10 mL 11   Multiple Vitamin (MULTIVITAMIN WITH MINERALS) TABS tablet Take 1 tablet by mouth daily.     ondansetron (ZOFRAN) 4 MG tablet Take 4 mg by mouth every 8 (eight) hours as needed.     potassium chloride SA (KLOR-CON M) 20 MEQ tablet Take 1 tablet (20 mEq total) by mouth daily. 90 tablet 3   No current facility-administered medications for this visit.     REVIEW OF SYSTEMS:   Constitutional: Denies fevers, chills or night sweats Eyes: Denies blurriness of vision Ears, nose, mouth, throat, and face: Denies mucositis or sore throat Respiratory: Denies cough, dyspnea or wheezes Cardiovascular: Denies palpitation, chest discomfort or lower extremity swelling Gastrointestinal:  Denies nausea, heartburn or change in bowel habits Skin: Denies abnormal skin rashes Lymphatics: Denies new lymphadenopathy or easy bruising Neurological:Denies numbness, tingling or new weaknesses Behavioral/Psych: Mood is stable, no new changes  All other systems were reviewed with the patient and are negative.  PHYSICAL EXAMINATION: ECOG PERFORMANCE STATUS: 1 - Symptomatic but completely ambulatory  Vitals:   05/18/22 1005  BP: (!) 174/84  Pulse: 87  Resp: 18  Temp: 97.7 F (36.5 C)  SpO2: 100%   Filed Weights   05/18/22 1005  Weight: 202 lb 9.6 oz (91.9 kg)    GENERAL:alert, no distress and comfortable  LABORATORY DATA:  I have reviewed the data as listed     Component Value Date/Time   NA 139 05/07/2022 1249   K 3.6 05/07/2022 1249   CL 106 05/07/2022 1249   CO2 24 05/07/2022 1249   GLUCOSE 86 05/07/2022  1249   BUN 12 05/07/2022 1249   CREATININE 0.91 05/07/2022 1249   CREATININE 0.93 05/03/2022 1436   CALCIUM 8.4 (L) 05/07/2022 1249   PROT 6.6 11/02/2021 1425   ALBUMIN 3.2 (L) 11/02/2021 1425   AST 29 11/02/2021 1425   ALT 27 11/02/2021 1425   ALKPHOS 124 11/02/2021 1425   BILITOT 0.8 11/02/2021 1425   GFRNONAA >60 05/07/2022 1249   GFRNONAA >60 05/03/2022 1436   GFRAA >60 12/04/2019 0253    No results found for: "SPEP", "UPEP"  Lab Results  Component Value Date   WBC 5.7 05/03/2022   NEUTROABS 3.6 05/03/2022   HGB 10.0 (L) 05/03/2022   HCT 29.3 (L) 05/03/2022   HCT 30.1 (L) 05/03/2022   MCV 84.8 05/03/2022   PLT 331 05/03/2022      Chemistry      Component Value Date/Time   NA 139 05/07/2022 1249   K 3.6 05/07/2022 1249   CL 106 05/07/2022 1249   CO2 24 05/07/2022 1249   BUN 12 05/07/2022 1249  CREATININE 0.91 05/07/2022 1249   CREATININE 0.93 05/03/2022 1436      Component Value Date/Time   CALCIUM 8.4 (L) 05/07/2022 1249   ALKPHOS 124 11/02/2021 1425   AST 29 11/02/2021 1425   ALT 27 11/02/2021 1425   BILITOT 0.8 11/02/2021 1425

## 2022-05-18 NOTE — Assessment & Plan Note (Signed)
I reviewed multiple test results with the patient His iron studies are adequate The overall picture is most consistent with anemia of chronic illness Due to high risk of bleeding as the patient remains on chronic antiplatelet agents, I recommend he continues on iron supplement indefinitely I attempted to call his nursing home but unable to talk to the provider who referred him here He does not need long-term follow-up He would not benefit from intravenous iron infusion and he does not need darbepoetin injection

## 2022-05-31 ENCOUNTER — Other Ambulatory Visit (HOSPITAL_COMMUNITY): Payer: Self-pay | Admitting: Internal Medicine

## 2022-06-16 NOTE — Progress Notes (Signed)
ADVANCED HF CLINIC NOTE  PCP: Manuel Dy, MD Primary Cardiologist: Manuel Chapman HF Cardiologist: Dr. 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 Resides at [facility].    Seen in [facility] 5/23 for diarrhea and hematochezia. While being triaged, heart rate dropped from 60 with first-degree block to possible type II Mobitz block in the 30s. Cardiology consulted and recommended admit for monitoring and K replacement. AV nodal blocking agents were discontinued.  Bradycardia improved off AV nodal blocking agents.Echo showed mildly reduced EF, 45-50%, GIIDD, mildly reduced RV and severe thickening of the aortic valve w/ severe aortic stenosis, with peak gradient of 65.70mHg and mean transvalvular gradient of 30.966mg. The patient's dimensionless index is 0.21 and calculated aortic valve area is 0.58cm. Structural heart team was consulted and being considered for possible TAVR. Outpatient monitor arranged to assess for further bradycardia. [facility] SNF[facility]   [facility] work up initiated. PYP scan was equivocal. Visual and quantitative assessment (grade 1, H/CL = 1.24). Reviewed by Dr. BeHaroldine Lawsho also agreed that study is equivocal and additional imaging is needed. Multiple Myeloma panel showed no M spike protein.    Heart monitor (6/23) showed HR ranging from 38-103 bpm, w/ an avg of 60 bpm. No afib detected. + brief NSVT and occasional second-degree type 1 block and 2nd degree type II block noted during nocturnal hours.    cMRI 9/23: LVEF 37% Moderate LVH. RV EF 40%. Possible functional bicuspid aortic valve, suspect significant stenosis. Small pericardial effusion. LGE in  the inferolateral and anterolateral walls suggestive of prior MI. There was mid-wall LGE at the anterior and inferior RV insertion sites. This is nonspecific and can be suggestive of pressure/volume overload.  Echo (12/23) showed EF 60-65%, mild LVH, grade II DD< RV normal, severe AS Aortic valve area, by VTI  0.79 cm. AoV mean gradient 28.5 mmHg. Aortic valve Vmax 3.64 m/s. Although the mean AVG is only 2844m, the SVI is low at 32 and DVI is low at 0.25 c/w paradoxical low flow low gradient severe  aortic stenosis.   Saw Dr. ThuAli Lowe24, no SOB, exertional angina or syncope so planning conservatively managing, repeating echo in 6 months.  Today he returns for HF follow up with his daughter and son-in-law. Overall feeling fine. [name] does not get up much, but no SOB walking to restroom or with transfers out of wheelchair. Denies palpitations, syncope, abnormal bleeding, CP, dizziness, edema, or PND/Orthopnea. Appetite ok. No fever or chills. Weight at facility stable. Taking all medications provided by facility.    Cardiac Testing  - Echo (12/23):  EF 60-65%, mild LVH, grade II DD< RV normal, severe AS Aortic valve area, by VTI  0.79 cm. AoV mean gradient 28.5 mmHg. Aortic valve Vmax 3.64 m/s. Although the mean AVG is only 95m73m the SVI is low at 32 and DVI is low at 0.25 c/w paradoxical low flow low gradient severe aortic stenosis.   - PYP Scan (6/23): Visual and quantitative assessment (grade 1, H/CL = 1.24) is equivocal for transthyretin amyloidosis.    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     atorvastatin (LIPITOR) 80 MG tablet Take 1 tablet (80 mg total) by mouth daily.     cholecalciferol (VITAMIN D3) 25 MCG (1000 UNIT) tablet Take 5,000 Units by mouth daily.     clopidogrel (PLAVIX) 75 MG tablet Take 1 tablet (75 mg total) by mouth daily.     empagliflozin (JARDIANCE) 10 MG TABS tablet Take 1 tablet (10 mg total) by mouth daily before breakfast. 30 tablet 11   ENTRESTO 49-51 MG Take 1 tablet by mouth 2 (two) times daily.     feeding supplement, GLUCERNA SHAKE, (GLUCERNA SHAKE) LIQD Take 237 mLs by mouth 2 (two) times daily between meals.     ferrous sulfate 325 (65 FE) MG tablet Take 1 tablet (325 mg total) by mouth 2 (two) times daily with a meal.  3   furosemide (LASIX) 20 MG tablet Take 2 tablets (40 mg total) by mouth daily. 90 tablet 3   hydrALAZINE (APRESOLINE) 100 MG tablet Take 100 mg by mouth 3 (three) times daily.     insulin detemir (LEVEMIR) 100 UNIT/ML injection Inject 0.12 mLs (12 Units total) into the skin daily. (Patient taking differently: Inject 16 Units into the skin daily.) 10 mL 11   Multiple Vitamin (MULTIVITAMIN WITH MINERALS) TABS tablet Take 1 tablet by mouth daily.     ondansetron (ZOFRAN) 4 MG tablet Take 4 mg by mouth every 8 (eight) hours as needed.     potassium chloride SA (KLOR-CON M) 20 MEQ tablet Take 1 tablet (20 mEq total) by mouth daily. 90 tablet 3   No current facility-administered medications for this encounter.   No Known Allergies  Social History   Socioeconomic History   Marital status: Married    Spouse name: Manuel Chapman   Number of children: 4   Years of education: Not on file   Highest  education level: High school graduate  Occupational History   Occupation: retired  Tobacco Use   Smoking status: Former    Packs/day: 1.00    Years: 10.00    Total pack years: 10.00    Types: Cigarettes    Quit date: 1998    Years since quitting: 26.1   Smokeless tobacco: Never  Vaping Use   Vaping Use: Never used  Substance and Sexual Activity   Alcohol use: Not Currently    Comment: not much, h/o heavy beer use   Drug use: No   Sexual activity: Not on file  Other Topics Concern   Not on file  Social History Narrative   Not on file   Social Determinants of Health   Financial Resource Strain: Low Risk  (09/17/2021)   Overall Financial Resource Strain (CARDIA)    Difficulty of Paying Living Expenses: Not hard at all  Food Insecurity: No Food Insecurity (09/17/2021)  Hunger Vital Sign    Worried About Running Out of Food in the Last Year: Never true    Ran Out of Food in the Last Year: Never true  Transportation Needs: No Transportation Needs (09/17/2021)   PRAPARE - Hydrologist (Medical): No    Lack of Transportation (Non-Medical): No  Physical Activity: Not on file  Stress: Not on file  Social Connections: Not on file  Intimate Partner Violence: Not on file   Family History  Problem Relation Age of Onset   Diabetes Neg Hx    Stroke Neg Hx    BP (!) 164/76   Pulse 90   Wt 89.3 kg (196 lb 12.8 oz) Comment: Patient was weighed in his facility on 06/02/22.  SpO2 98%   BMI 31.76 kg/m   Wt Readings from Last 3 Encounters:  06/18/22 89.3 kg (196 lb 12.8 oz)  05/18/22 91.9 kg (202 lb 9.6 oz)  05/10/22 88.7 kg (195 lb 9.6 oz)   PHYSICAL EXAM: General:  NAD. No resp difficulty, arrived in [facility] HEENT: edentulous Neck: Supple. No JVD. Carotids 2+ bilat; no bruits. No lymphadenopathy or thryomegaly appreciated. Cor: PMI nondisplaced. Regular rate & rhythm. No rubs, gallops, 3/6 AS Lungs: Clear Abdomen: Soft, nontender, nondistended. No  hepatosplenomegaly. No bruits or masses. Good bowel sounds. Extremities: No cyanosis, clubbing, rash, edema Neuro: Alert & oriented x 3, cranial nerves grossly intact. Moves all 4 extremities w/o difficulty. Affect pleasant. + LUE weakness  ASSESSMENT & PLAN: Chronic Combined Systolic and Diastolic Heart Failure - Echo 5/23 EF 45-50%, GIIDD, mildly reduced RV - W/ concomitant conduction disease w/ known AV block and severe AS, ? Amyloid. PYP Scan was done 6/23 and visual and quantitative assessment (grade 1, H/CL = 1.24) is equivocal for transthyretin amyloidosis.  - cMRI 9/23: LVEF 37% Moderate LVH. RV EF 40%. Possible functional bicuspid aortic valve, suspect significant stenosis. Small pericardial effusion. LGE in the inferolateral and anterolateral walls suggestive of prior MI. There was mid-wall LGE at the anterior and inferior RV insertion sites. This is nonspecific and can be suggestive of pressure/volume overload. - Multiple Myeloma Panel no M-spike protein  - W/u for amyloid negative to date. LVH likely related to HTN  - Echo (12/23): EF 60-65%, mild LVH, grade II DD, RV ok, severe AI - NYHA III, confounded by physical deconditioning/recent stroke. Volume looks good today. - Start Imdur 15 mg daily. Notify clinic if HA refractory to Tylenol use. - Continue hydralazine 100 mg tid.  - Continue Jardiance 10 mg daily. No GU symptoms. - Continue Entresto 49/51 mg bid. - Continue Lasix 40 mg daily.  - Continue compression hose. - Add MRA as tolerated. - Off AVN blockers with heart block  - Labs today  2. Aortic Stenosis  - Severe by Echo 5/23, peak gradient of 65.2 mmHg and mean transvalvular gradient of 30.9 mmHg. Dimensionless index is 0.21 and calculated aortic valve area is 0.58 cm. - Echo 12/23 showed AoV mean gradient 28.5 mmHg. Aortic valve Vmax 3.64 m/s. Although the mean AVG is only 31mHg, the SVI is low at 32 and DVI is low at 0.25 c/w paradoxical low flow low gradient severe  aortic stenosis.  - Denies CP, dyspnea, syncope.  - On exam, AS appears moderate as s2 well preserved - Dr. TAli Lowefollowing for possible TAVR. ? Candidacy with limited functional capacity. Planning repeat echo in 6 months then re-assess.   3. Bradycardia  - type II Mobitz block  in the 30s recent hospitalization - Improved w/ discontinuation of AV nodal blockers. Continue to avoid AV nodal blockers  - 30 day monitor (6/23): some bradycardia, avg HR 60s and 2nd degree type II block noted during nocturnal hours - No current indication for PPM.  - Regular on exam today. - Amyloid w/u per above.    4. H/o CVA - He initially deferred loop at time of diagnosis for afib surveillance - 30 day monitor showed no Afib.  - Residual LUE weakness, resides at SNF  - Continue Plavix + statin - BP well controlled  5. HTN - BP elevated, now off clonidine. - Add Imdur as above.  Follow up in 3 months with Dr. Haroldine Chapman.  Rafael Bihari, FNP  11:34 AM

## 2022-06-18 ENCOUNTER — Ambulatory Visit (HOSPITAL_COMMUNITY)
Admission: RE | Admit: 2022-06-18 | Discharge: 2022-06-18 | Disposition: A | Discharge status: Home / Self Care | Payer: Medicare HMO | Source: Ambulatory Visit | Attending: Family Medicine | Admitting: Family Medicine

## 2022-06-18 ENCOUNTER — Encounter (HOSPITAL_COMMUNITY): Payer: Self-pay

## 2022-06-18 VITALS — BP 164/76 | HR 90 | Wt 196.8 lb

## 2022-06-18 DIAGNOSIS — Z79899 Other long term (current) drug therapy: Secondary | ICD-10-CM | POA: Diagnosis not present

## 2022-06-18 DIAGNOSIS — I5042 Chronic combined systolic (congestive) and diastolic (congestive) heart failure: Secondary | ICD-10-CM | POA: Diagnosis not present

## 2022-06-18 DIAGNOSIS — I459 Conduction disorder, unspecified: Secondary | ICD-10-CM | POA: Diagnosis not present

## 2022-06-18 DIAGNOSIS — Z7902 Long term (current) use of antithrombotics/antiplatelets: Secondary | ICD-10-CM | POA: Diagnosis not present

## 2022-06-18 DIAGNOSIS — I252 Old myocardial infarction: Secondary | ICD-10-CM | POA: Diagnosis not present

## 2022-06-18 DIAGNOSIS — R001 Bradycardia, unspecified: Secondary | ICD-10-CM | POA: Diagnosis not present

## 2022-06-18 DIAGNOSIS — I350 Nonrheumatic aortic (valve) stenosis: Secondary | ICD-10-CM

## 2022-06-18 DIAGNOSIS — I152 Hypertension secondary to endocrine disorders: Secondary | ICD-10-CM

## 2022-06-18 DIAGNOSIS — I110 Hypertensive heart disease with heart failure: Secondary | ICD-10-CM | POA: Diagnosis not present

## 2022-06-18 DIAGNOSIS — Z7984 Long term (current) use of oral hypoglycemic drugs: Secondary | ICD-10-CM | POA: Insufficient documentation

## 2022-06-18 DIAGNOSIS — Z8673 Personal history of transient ischemic attack (TIA), and cerebral infarction without residual deficits: Secondary | ICD-10-CM

## 2022-06-18 DIAGNOSIS — I5032 Chronic diastolic (congestive) heart failure: Secondary | ICD-10-CM | POA: Diagnosis not present

## 2022-06-18 DIAGNOSIS — E1159 Type 2 diabetes mellitus with other circulatory complications: Secondary | ICD-10-CM

## 2022-06-18 LAB — BASIC METABOLIC PANEL
Anion gap: 6 (ref 5–15)
BUN: 32 mg/dL — ABNORMAL HIGH (ref 8–23)
CO2: 26 mmol/L (ref 22–32)
Calcium: 8.4 mg/dL — ABNORMAL LOW (ref 8.9–10.3)
Chloride: 100 mmol/L (ref 98–111)
Creatinine, Ser: 1.11 mg/dL (ref 0.61–1.24)
GFR, Estimated: >60 mL/min (ref >60)
Glucose, Bld: 247 mg/dL — ABNORMAL HIGH (ref 70–99)
Potassium: 4.3 mmol/L (ref 3.5–5.1)
Sodium: 132 mmol/L — ABNORMAL LOW (ref 135–145)

## 2022-06-18 LAB — BRAIN NATRIURETIC PEPTIDE: B Natriuretic Peptide: 90.8 pg/mL (ref 0.0–100.0)

## 2022-06-18 MED ORDER — ISOSORBIDE MONONITRATE ER 30 MG PO TB24: 15.0000 mg | ORAL_TABLET | Freq: Every day | ORAL | 3 refills | Status: DC

## 2022-06-18 NOTE — Patient Instructions (Addendum)
Instructions below were given to patient via SNF order [facility])  Start Imdur 15 mg daily  Labs today We will only contact you if something comes back abnormal or we need to make some changes. Otherwise no news is good news!  Your physician recommends that you schedule a follow-up appointment in: 3 months -please call in one month to schedule a late spring/early summer appointment  At the [facility], you and your health needs are our priority. As part of our continuing mission to provide you with exceptional heart care, we have created designated Provider Care Teams. These Care Teams include your primary Cardiologist (physician) and Advanced Practice Providers (APPs- Physician Assistants and Nurse Practitioners) who all work together to provide you with the care you need, when you need it.   You may see any of the following providers on your designated Care Team at your next follow up: Dr Glori Bickers Dr Loralie Champagne Dr. Roxana Hires, NP Lyda Jester, [place] [facility] [place], [place] Forestine Na, NP Audry Riles, PharmD   Please be sure to bring in all your medications bottles to every appointment.    Thank you for choosing [facility]

## 2022-08-16 IMAGING — NM NM SCAN TUMOR LOCALIZE WITH SPECT
2 series · 7 of 7 positions shown · non-contrast
Comparison: none

CLINICAL DATA: Chronic diastolic heart failure question cardiac
amyloidosis

EXAM:
NUCLEAR MEDICINE TUMOR LOCALIZATION. PYP CARDIAC AMYLOIDOSIS SCAN
WITH SPECT
TECHNIQUE: Following intravenous administration of radiopharmaceutical,
anterior planar images of the chest were obtained. Regions of
interest were placed on the heart and contralateral chest wall for
quantitative assessment. Additional SPECT imaging of the chest was
obtained.
RADIOPHARMACEUTICALS:  22 mCi TECHNETIUM 99 PYROPHOSPHATE

[Series 1: anterior · 4.14mm/px · 1 of 1 slices shown]
[im 1/1]
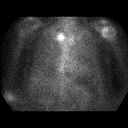

[Series 2: amyloid spect · 4.14mm/px · 6 of 64 frames shown]
[frame 6/64]
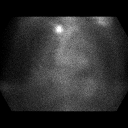
[frame 16/64]
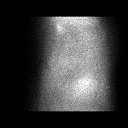
[frame 27/64]
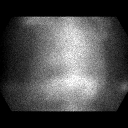
[frame 38/64]
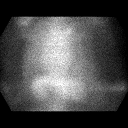
[frame 48/64]
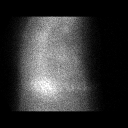
[frame 59/64]
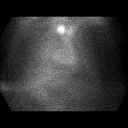

[7 of 7 positions shown; findings below may reference images not displayed]

FINDINGS: Planar Visual assessment:

Anterior planar imaging demonstrates radiotracer uptake within the
heart less than uptake within the adjacent ribs (Grade 1).

Quantitative assessment :

Quantitative assessment of the cardiac uptake compared to the
contralateral chest wall is equal to 1.24 (H/CL = 1.24).

SPECT assessment: SPECT imaging of the chest demonstrates minimal
radiotracer accumulation within the LEFT ventricle.
IMPRESSION: Visual and quantitative assessment (grade 1, H/CL = 1.24) is
equivocal for transthyretin amyloidosis.

## 2022-08-30 ENCOUNTER — Other Ambulatory Visit: Payer: Self-pay | Admitting: Physician Assistant

## 2022-11-01 NOTE — Progress Notes (Unsigned)
Patient ID: Manuel Chapman MRN: [id number] DOB/AGE: [birth year] 79 y.o.  Primary Care Physician:Roberts, Windy Fast, MD Primary Cardiologist: Alverda Skeans, MD  PATIENT DID NOT APPEAR FOR APPOINTMENT   FOCUSED CARDIOVASCULAR PROBLEM LIST:   1.  Paradoxical low-flow low gradient aortic stenosis with a stroke-volume index of 32, DVI of 0.25, mean gradient of 28.5 mmHg, and peak velocity of 3.64 m/s with an aortic valve area of 0.79 cm; conduction demonstrates sinus rhythm with first-degree AV block 2.  History of cerebellar stroke 2023; history of subarachnoid hemorrhage and left subcortical and right temporal embolic infarct 2130 with residual left upper extremity weakness 3.  History of transient Mobitz type II/III block during admission for GI bleeding May 2023; not on nodal blocking agents 4.  Type 2 diabetes on insulin 5.  Hypertension 6.  Hyperlipidemia 7.  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  BMI 31     HISTORY OF PRESENT ILLNESS:  September 2023: The patient is a 77 y.o. male with the indicated medical history here for hospital follow-up.  The patient was admitted in May 2023 with a possible GI bleed.  He was was relatively bradycardic with possible Mobitz type II or type III block with heart rates in the 30s.  AV nodal blocking agents were discontinued.  An echocardiogram was performed which demonstrated moderate to severe aortic stenosis.  During that evaluation it was thought that he was a marginal candidate for an aortic valve intervention and a plan was advised for him to follow-up in 3 months to assess further.  He was seen in in the [facility] clinic in June and was noted to be able to ambulate with a walker.  He was found to be mildly fluid overloaded and Lasix was started.  A monitor had been placed which demonstrated no  atrial fibrillation with occasional type I and second-degree type II block seen during nocturnal hours.  Nonsustained ventricular tachycardia was also present.  He was seen in the [facility] clinic in 77.  He denied any resting or exertional dyspnea in his weight was noted to be decreased by 10 pounds.  Given a equivocal PYP study he was referred for a cardiac MRI especially given his conduction disease.  The patient is here with his son-in-law.  He remains at the rehabilitation center.  He is relatively asymptomatic.  He is much more conversant and engaged and is alert and oriented x3 today versus when I saw him in the hospital.  He does not do much in a day.  It seems like he is relatively sedentary.  He however denies any dyspnea on transferring from his wheelchair to his bed.  He denies any exertional angina.  He has had no presyncope or syncope.  He has required no emergency room visits or hospitalizations fortunately.  He feels relatively well.  There are some question as to how whether he is getting stronger or not.  He tells me that he is walking with a walker however his son-in-law is unsure and has never seen him doing this.  He still has left upper extremity motor limitations from his stroke.  He has no teeth and no dentures.  Plan: Continue to monitor symptoms and follow-up in 3 months.  January 2023: In the interim he was seen in the [facility] clinic in late 77 and was doing very well.  He is  still at [facility].  He was walking with a walker without angina, severe dyspnea, presyncope, or syncope.  He is compliant with all of his medications.  He unfortunately is here without family.  He is mostly in a wheelchair during the day.  He however denies any shortness of breath, presyncope, or exertional angina.  He is not that active on a regular basis per his own admission.  He fortunately has not required any emergency room visits or hospitalizations. Plan: Follow up 6 months with TTE; marginal  candidate for aortic valve intervention.  Today: In the interim the patient was seen in the heart failure clinic in 77 and was doing well.  He was noted to be mostly wheelchair-bound at that appointment.  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  Family History  Problem Relation Age of Onset   Diabetes Neg Hx    Stroke Neg Hx     Social History   Socioeconomic History   Marital status: Married    Spouse name: Gaylyn Rong   Number of children: 4   Years of education: Not on file   Highest education level: High school graduate  Occupational History   Occupation: retired  Tobacco Use   Smoking status: Former    Current packs/day: 0.00    Average packs/day: 1 pack/day for 10.0 years (10.0 ttl pk-yrs)    Types: Cigarettes    Start date: 57    Quit date: 1998    Years since quitting: 26.5   Smokeless tobacco: Never  Vaping Use   Vaping status: Never Used  Substance and Sexual Activity   Alcohol use: Not Currently    Comment: not much, h/o heavy beer use   Drug use: No   Sexual activity: Not on file  Other Topics Concern   Not on file  Social History Narrative   Not on file   Social Determinants of Health   Financial Resource Strain: Low Risk  (09/17/2021)   Overall Financial Resource Strain (CARDIA)    Difficulty of Paying Living Expenses: Not hard at all  Food Insecurity: No Food Insecurity (09/17/2021)   Hunger Vital Sign    Worried About Running Out of Food in the Last Year: Never true    Ran Out of Food in the Last Year: Never true  Transportation Needs: No Transportation Needs (09/17/2021)    PRAPARE - Administrator, Civil Service (Medical): No    Lack of Transportation (Non-Medical): No  Physical Activity: Not on file  Stress: Not on file  Social Connections: Not on file  Intimate Partner Violence: Not on file     Prior to Admission medications   Medication Sig Start Date End Date Taking? Authorizing Provider  acetaminophen (TYLENOL) 325 MG tablet Take 2 tablets (650 mg total) by mouth every 4 (four) hours as needed for mild pain (or temp > 37.5 C (99.5 F)). 05/09/21   Elgergawy, Leana Roe, MD  allopurinol (ZYLOPRIM) 300 MG tablet Take 300 mg by mouth daily. Patient not taking: Reported on 01/04/2022    [provider]  atorvastatin (LIPITOR) 80 MG tablet Take 1  tablet (80 mg total) by mouth daily. 06/22/21   Angiulli, Mcarthur Rossetti, PA-C  cholecalciferol (VITAMIN D3) 25 MCG (1000 UNIT) tablet Take 1,000 Units by mouth daily.    [provider]  cloNIDine (CATAPRES) 0.2 MG tablet Take 1 tablet (0.2 mg total) by mouth 3 (three) times daily. 06/22/21   Angiulli, Mcarthur Rossetti, PA-C  clopidogrel (PLAVIX) 75 MG tablet Take 1 tablet (75 mg total) by mouth daily. 05/10/21   Elgergawy, Leana Roe, MD  feeding supplement, GLUCERNA SHAKE, (GLUCERNA SHAKE) LIQD Take 237 mLs by mouth 2 (two) times daily between meals.    [provider]  ferrous sulfate 325 (65 FE) MG tablet Take 1 tablet (325 mg total) by mouth 2 (two) times daily with a meal. 09/18/21   Rhetta Mura, MD  furosemide (LASIX) 20 MG tablet Take 1 tablet (20 mg total) by mouth daily. 09/25/21   Robbie Lis M, PA-C  hydrALAZINE (APRESOLINE) 100 MG tablet Take 100 mg by mouth 3 (three) times daily.    [provider]  insulin detemir (LEVEMIR) 100 UNIT/ML injection Inject 0.12 mLs (12 Units total) into the skin daily. Patient taking differently: Inject 10-22 Units into the skin See admin instructions. Inject 22 units subcutaneous at 0800, then inject 10 units subcutaneous at 2000 per [facility]  07/02/21   Angiulli, Mcarthur Rossetti, PA-C  lisinopril (ZESTRIL) 40 MG tablet Take 1 tablet (40 mg total) by mouth daily. 12/05/19   Arline Asp, NP  losartan (COZAAR) 25 MG tablet Take 0.5 tablets (12.5 mg total) by mouth at bedtime. 01/04/22 07/03/22  Jacklynn Ganong, FNP  Multiple Vitamin (MULTIVITAMIN WITH MINERALS) TABS tablet Take 1 tablet by mouth daily. 05/10/21   Elgergawy, Leana Roe, MD  ondansetron (ZOFRAN) 4 MG tablet Take 4 mg by mouth every 8 (eight) hours as needed. 08/10/21   [provider]  potassium chloride SA (KLOR-CON M) 20 MEQ tablet Take 1 tablet (20 mEq total) by mouth daily. 09/25/21   Robbie Lis M, PA-C    No Known Allergies  REVIEW OF SYSTEMS:  General: no fevers/chills/night sweats Eyes: no blurry vision, diplopia, or amaurosis ENT: no sore throat or hearing loss Resp: no cough, wheezing, or hemoptysis CV: no edema or palpitations GI: no abdominal pain, nausea, vomiting, diarrhea, or constipation GU: no dysuria, frequency, or hematuria Skin: no rash Neuro: no headache, numbness, tingling, or weakness of extremities Musculoskeletal: no joint pain or swelling Heme: no bleeding, DVT, or easy bruising Endo: no polydipsia or polyuria  There were no vitals taken for this visit.  PHYSICAL EXAM: GEN:  AO x 3 in no acute distress HEENT: normal Dentition: Lacking all teeth Neck: JVP normal. +1 carotid upstrokes without bruits. No thyromegaly. Lungs: equal expansion, clear bilaterally CV: Apex is discrete and nondisplaced, RRR with 3/6 crescendo decrescendo murmur Abd: soft, non-tender, non-distended; no bruit; positive bowel sounds Ext: no edema, ecchymoses, or cyanosis Vascular: 2+ femoral pulses, 2+ radial pulses       Skin: warm and dry without rash Neuro: Left upper extremity weakness    DATA AND STUDIES:  EKG: May 2023 sinus rhythm with first-degree AV block and occasional PACs   2D ECHO: May 2023 ejection fraction of 55 to 60% with  severe aortic stenosis with a peak velocity of 4.6 m/s, mean gradient of 39 mmHg, and aortic valve area of 0.88 cm with no other significant valvular abnormalities   CARDIAC CATH: n/a   STS RISK CALCULATOR:  Procedure: AV Replacement  Risk of Mortality: 2.427% Renal Failure: 8.820% Permanent Stroke: 1.955% Prolonged Ventilation: 12.005% DSW Infection: 0.286% Reoperation: 3.715% Morbidity or Mortality: 18.385% Short Length of Stay: 18.033% Long Length of Stay: 10.931%   NHYA CLASS: 1    ASSESSMENT AND PLAN:   Nonrheumatic aortic valve stenosis  Chronic diastolic heart failure (HCC)  Second degree AV block  Type 2 diabetes mellitus with complication, with long-term current use of insulin (HCC)  Hyperlipidemia associated with type 2 diabetes mellitus (HCC)  Hypertension associated with diabetes (HCC)  Acute CVA (cerebrovascular accident) (HCC)    Orbie Pyo, MD  11/04/2022 10:04 AM    [facility] [address] [place], [place], [place]  [address] Phone: [phone]; Fax: [phone]

## 2022-11-04 ENCOUNTER — Ambulatory Visit (HOSPITAL_COMMUNITY): Payer: Medicare HMO

## 2022-11-04 ENCOUNTER — Ambulatory Visit: Payer: Medicare HMO | Admitting: Internal Medicine

## 2022-11-04 DIAGNOSIS — I441 Atrioventricular block, second degree: Secondary | ICD-10-CM

## 2022-11-04 DIAGNOSIS — I5032 Chronic diastolic (congestive) heart failure: Secondary | ICD-10-CM

## 2022-11-04 DIAGNOSIS — I639 Cerebral infarction, unspecified: Secondary | ICD-10-CM

## 2022-11-04 DIAGNOSIS — Z794 Long term (current) use of insulin: Secondary | ICD-10-CM

## 2022-11-04 DIAGNOSIS — E1169 Type 2 diabetes mellitus with other specified complication: Secondary | ICD-10-CM

## 2022-11-04 DIAGNOSIS — I350 Nonrheumatic aortic (valve) stenosis: Secondary | ICD-10-CM

## 2022-11-04 DIAGNOSIS — E1159 Type 2 diabetes mellitus with other circulatory complications: Secondary | ICD-10-CM

## 2022-12-03 NOTE — Progress Notes (Signed)
Patient ID: Manuel Chapman MRN: [id number] DOB/AGE: 79/04/1945 79 y.o.  Primary Care Physician:Roberts, Windy Fast, MD Primary Cardiologist: Alverda Skeans, MD  FOCUSED CARDIOVASCULAR PROBLEM LIST:   1.  Paradoxical low-flow low gradient aortic stenosis with a stroke-volume index of 32, DVI of 0.25, mean gradient of 28.5 mmHg, and peak velocity of 3.64 m/s with an aortic valve area of 0.79 cm; conduction demonstrates sinus rhythm with first-degree AV block 2.  History of cerebellar stroke 2023; history of subarachnoid hemorrhage and left subcortical and right temporal embolic infarct 4098 with residual left upper extremity weakness 3.  History of transient Mobitz type II/III block during admission for GI bleeding May 2023; not on nodal blocking agents 4.  Type 2 diabetes on insulin 5.  Hypertension 6.  Hyperlipidemia 7.  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  BMI 31   HISTORY OF PRESENT ILLNESS:  September 2023: The patient is a 77 y.o. male with the indicated medical history here for hospital follow-up.  The patient was admitted in May 2023 with a possible GI bleed.  He was was relatively bradycardic with possible Mobitz type II or type III block with heart rates in the 30s.  AV nodal blocking agents were discontinued.  An echocardiogram was performed which demonstrated moderate to severe aortic stenosis.  During that evaluation it was thought that he was a marginal candidate for an aortic valve intervention and a plan was advised for him to follow-up in 3 months to assess further.  He was seen in in the [facility] clinic in June and was noted to be able to ambulate with a walker.  He was found to be mildly fluid overloaded and Lasix was started.  A monitor had been placed which demonstrated no atrial fibrillation with occasional type I and  second-degree type II block seen during nocturnal hours.  Nonsustained ventricular tachycardia was also present.  He was seen in the [facility] clinic in July.  He denied any resting or exertional dyspnea in his weight was noted to be decreased by 10 pounds.  Given a equivocal PYP study he was referred for a cardiac MRI especially given his conduction disease.  The patient is here with his son-in-law.  He remains at the rehabilitation center.  He is relatively asymptomatic.  He is much more conversant and engaged and is alert and oriented x3 today versus when I saw him in the hospital.  He does not do much in a day.  It seems like he is relatively sedentary.  He however denies any dyspnea on transferring from his wheelchair to his bed.  He denies any exertional angina.  He has had no presyncope or syncope.  He has required no emergency room visits or hospitalizations fortunately.  He feels relatively well.  There are some question as to how whether he is getting stronger or not.  He tells me that he is walking with a walker however his son-in-law is unsure and has never seen him doing this.  He still has left upper extremity motor limitations from his stroke.  He has no teeth and no dentures.  Plan: Continue to monitor symptoms and follow-up in 3 months.  January 2023: In the interim he was seen in the [facility] clinic in late December and was doing very well.  He is still at [employer].  He was walking with  a walker without angina, severe dyspnea, presyncope, or syncope.  He is compliant with all of his medications.  He unfortunately is here without family.  He is mostly in a wheelchair during the day.  He however denies any shortness of breath, presyncope, or exertional angina.  He is not that active on a regular basis per his own admission.  He fortunately has not required any emergency room visits or hospitalizations. Plan: Follow up 6 months with TTE; marginal candidate for aortic valve intervention.  Today:  In the interim the patient was seen in the heart failure clinic in February and was doing well.  He was noted to be mostly wheelchair-bound at that appointment.  Echocardiogram done today shows moderate to severe aortic stenosis with a preserved LV function. He is here with his family.  He continues to do well.  He is at an assisted living center and he does not do much at all by his own admission.  He denies any shortness of breath or chest pain.  He has required no hospitalizations or emergency room visits.  He feels fine doing very little.  It is unclear whether he understands exactly how significant his aortic valvular disease is and how this will affect his health in the future.  Right now he would like to avoid any procedures.  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 No pertinent surgical history.  Family History  Problem Relation Age of Onset   Diabetes Neg Hx    Stroke Neg Hx     Social History   Socioeconomic History   Marital status: Married    Spouse name: Gaylyn Rong   Number of children: 4   Years of education: Not on file   Highest education level: High school graduate  Occupational History   Occupation: retired  Tobacco Use   Smoking status: Former    Current packs/day: 0.00    Average packs/day: 1 pack/day for 10.0 years (10.0 ttl pk-yrs)    Types: Cigarettes    Start date: 89    Quit date: 1998    Years since quitting: 26.6   Smokeless tobacco: Never  Vaping Use   Vaping status: Never Used  Substance and Sexual Activity   Alcohol use: Not Currently    Comment: not much, h/o heavy beer use   Drug use: No    Sexual activity: Not on file  Other Topics Concern   Not on file  Social History Narrative   Not on file   Social Determinants of Health   Financial Resource Strain: Low Risk  (09/17/2021)   Overall Financial Resource Strain (CARDIA)    Difficulty of Paying Living Expenses: Not hard at all  Food Insecurity: No Food Insecurity (09/17/2021)   Hunger Vital Sign    Worried About Running Out of Food in the Last Year: Never true    Ran Out of Food in the Last Year: Never true  Transportation Needs: No Transportation Needs (09/17/2021)   PRAPARE - Administrator, Civil Service (Medical): No    Lack of Transportation (Non-Medical): No  Physical Activity: Not on file  Stress: Not on file  Social  Connections: Not on file  Intimate Partner Violence: Not on file     Prior to Admission medications   Medication Sig Start Date End Date Taking? Authorizing Provider  acetaminophen (TYLENOL) 325 MG tablet Take 2 tablets (650 mg total) by mouth every 4 (four) hours as needed for mild pain (or temp > 37.5 C (99.5 F)). 05/09/21   Elgergawy, Leana Roe, MD  allopurinol (ZYLOPRIM) 300 MG tablet Take 300 mg by mouth daily. Patient not taking: Reported on 01/04/2022    [provider]  atorvastatin (LIPITOR) 80 MG tablet Take 1 tablet (80 mg total) by mouth daily. 06/22/21   Angiulli, Mcarthur Rossetti, PA-C  cholecalciferol (VITAMIN D3) 25 MCG (1000 UNIT) tablet Take 1,000 Units by mouth daily.    [provider]  cloNIDine (CATAPRES) 0.2 MG tablet Take 1 tablet (0.2 mg total) by mouth 3 (three) times daily. 06/22/21   Angiulli, Mcarthur Rossetti, PA-C  clopidogrel (PLAVIX) 75 MG tablet Take 1 tablet (75 mg total) by mouth daily. 05/10/21   Elgergawy, Leana Roe, MD  feeding supplement, GLUCERNA SHAKE, (GLUCERNA SHAKE) LIQD Take 237 mLs by mouth 2 (two) times daily between meals.    [provider]  ferrous sulfate 325 (65 FE) MG tablet Take 1 tablet (325 mg total) by mouth 2 (two) times daily  with a meal. 09/18/21   Rhetta Mura, MD  furosemide (LASIX) 20 MG tablet Take 1 tablet (20 mg total) by mouth daily. 09/25/21   Robbie Lis M, PA-C  hydrALAZINE (APRESOLINE) 100 MG tablet Take 100 mg by mouth 3 (three) times daily.    [provider]  insulin detemir (LEVEMIR) 100 UNIT/ML injection Inject 0.12 mLs (12 Units total) into the skin daily. Patient taking differently: Inject 10-22 Units into the skin See admin instructions. Inject 22 units subcutaneous at 0800, then inject 10 units subcutaneous at 2000 per [facility] 07/02/21   Angiulli, Mcarthur Rossetti, PA-C  lisinopril (ZESTRIL) 40 MG tablet Take 1 tablet (40 mg total) by mouth daily. 12/05/19   Arline Asp, NP  losartan (COZAAR) 25 MG tablet Take 0.5 tablets (12.5 mg total) by mouth at bedtime. 01/04/22 07/03/22  Jacklynn Ganong, FNP  Multiple Vitamin (MULTIVITAMIN WITH MINERALS) TABS tablet Take 1 tablet by mouth daily. 05/10/21   Elgergawy, Leana Roe, MD  ondansetron (ZOFRAN) 4 MG tablet Take 4 mg by mouth every 8 (eight) hours as needed. 08/10/21   [provider]  potassium chloride SA (KLOR-CON M) 20 MEQ tablet Take 1 tablet (20 mEq total) by mouth daily. 09/25/21   Robbie Lis M, PA-C    No Known Allergies  REVIEW OF SYSTEMS:  General: no fevers/chills/night sweats Eyes: no blurry vision, diplopia, or amaurosis ENT: no sore throat or hearing loss Resp: no cough, wheezing, or hemoptysis CV: no edema or palpitations GI: no abdominal pain, nausea, vomiting, diarrhea, or constipation GU: no dysuria, frequency, or hematuria Skin: no rash Neuro: no headache, numbness, tingling, or weakness of extremities Musculoskeletal: no joint pain or swelling Heme: no bleeding, DVT, or easy bruising Endo: no polydipsia or polyuria  BP 118/66   Pulse 65   Ht 5\' 6"  (1.676 m)   Wt 212 lb (96.2 kg)   SpO2 98%   BMI 34.22 kg/m   PHYSICAL EXAM: GEN:  AO x 3 in no acute distress HEENT: normal Dentition:  Lacking all teeth Neck: JVP normal. +1 carotid upstrokes without bruits. No thyromegaly. Lungs: equal expansion, clear bilaterally CV: Apex is discrete and nondisplaced,  RRR with 3/6 crescendo decrescendo murmur Abd: soft, non-tender, non-distended; no bruit; positive bowel sounds Ext: no edema, ecchymoses, or cyanosis Vascular: 2+ femoral pulses, 2+ radial pulses       Skin: warm and dry without rash Neuro: Left upper extremity weakness    DATA AND STUDIES:  EKG: May 2023 sinus rhythm with first-degree AV block and occasional PACs   2D ECHO: May 2023 ejection fraction of 55 to 60% with severe aortic stenosis with a peak velocity of 4.6 m/s, mean gradient of 39 mmHg, and aortic valve area of 0.88 cm with no other significant valvular abnormalities   CARDIAC CATH: n/a   STS RISK CALCULATOR:  Procedure: AV Replacement   Risk of Mortality: 2.427% Renal Failure: 8.820% Permanent Stroke: 1.955% Prolonged Ventilation: 12.005% DSW Infection: 0.286% Reoperation: 3.715% Morbidity or Mortality: 18.385% Short Length of Stay: 18.033% Long Length of Stay: 10.931%   NHYA CLASS: 1    ASSESSMENT AND PLAN:   Nonrheumatic aortic valve stenosis - Plan: ECHOCARDIOGRAM COMPLETE  Chronic diastolic heart failure (HCC)  Second degree AV block  Type 2 diabetes mellitus with complication, with long-term current use of insulin (HCC)  Hyperlipidemia associated with type 2 diabetes mellitus (HCC)  Hypertension associated with diabetes (HCC)  I reviewed the patient's echocardiogram done today which demonstrates moderate bordering on severe aortic stenosis.  The patient is completely asymptomatic.  Again he strikes me as a very marginal candidate for transplant catheter aortic valve replacement.  I spoke with him and his family.  His family understands this and agrees with my assessment.  I told him to prepare for the fact that he may feel worse and our opinion may be nothing is to be  offered in the future.  They again understand and agree with this.  I will see him back in 6 months with another echocardiogram.  Orbie Pyo, MD  12/10/2022 3:17 PM    [facility] [address] [place], [place], [place]  [address] Phone: [phone]; Fax: [phone]

## 2022-12-10 ENCOUNTER — Encounter: Payer: Self-pay | Admitting: Internal Medicine

## 2022-12-10 ENCOUNTER — Ambulatory Visit (HOSPITAL_COMMUNITY): Payer: Medicare HMO | Attending: Internal Medicine

## 2022-12-10 ENCOUNTER — Ambulatory Visit: Payer: Medicare HMO | Attending: Internal Medicine | Admitting: Internal Medicine

## 2022-12-10 VITALS — BP 118/66 | HR 65 | Ht 66.0 in | Wt 212.0 lb

## 2022-12-10 DIAGNOSIS — I441 Atrioventricular block, second degree: Secondary | ICD-10-CM

## 2022-12-10 DIAGNOSIS — E118 Type 2 diabetes mellitus with unspecified complications: Secondary | ICD-10-CM | POA: Diagnosis not present

## 2022-12-10 DIAGNOSIS — Z794 Long term (current) use of insulin: Secondary | ICD-10-CM

## 2022-12-10 DIAGNOSIS — I350 Nonrheumatic aortic (valve) stenosis: Secondary | ICD-10-CM | POA: Diagnosis not present

## 2022-12-10 DIAGNOSIS — E1159 Type 2 diabetes mellitus with other circulatory complications: Secondary | ICD-10-CM

## 2022-12-10 DIAGNOSIS — I5032 Chronic diastolic (congestive) heart failure: Secondary | ICD-10-CM

## 2022-12-10 DIAGNOSIS — E1169 Type 2 diabetes mellitus with other specified complication: Secondary | ICD-10-CM

## 2022-12-10 DIAGNOSIS — I152 Hypertension secondary to endocrine disorders: Secondary | ICD-10-CM

## 2022-12-10 DIAGNOSIS — E785 Hyperlipidemia, unspecified: Secondary | ICD-10-CM

## 2022-12-10 LAB — ECHOCARDIOGRAM COMPLETE
AR max vel: 0.86 cm2
AV Area VTI: 0.88 cm2
AV Area mean vel: 0.86 cm2
AV Mean grad: 37 mmHg
AV Peak grad: 63.7 mmHg
Ao pk vel: 3.99 m/s
Area-P 1/2: 3.12 cm2
Est EF: 55
S' Lateral: 3.8 cm

## 2022-12-10 MED ORDER — PERFLUTREN LIPID MICROSPHERE
1.0000 mL | INTRAVENOUS | Status: AC | PRN
  Administered 2022-12-10: 2 mL via INTRAVENOUS

## 2022-12-10 NOTE — Patient Instructions (Signed)
Medication Instructions:  No changes *If you need a refill on your cardiac medications before your next appointment, please call your pharmacy*   Lab Work: none If you have labs (blood work) drawn today and your tests are completely normal, you will receive your results only by: MyChart Message (if you have MyChart) OR A paper copy in the mail If you have any lab test that is abnormal or we need to change your treatment, we will call you to review the results.   Testing/Procedures: ECHO DUE IN 6 MONTHS- SAME DAY AS APPOINTMENT W DR. Lynnette Caffey Your physician has requested that you have an echocardiogram. Echocardiography is a painless test that uses sound waves to create images of your heart. It provides your doctor with information about the size and shape of your heart and how well your heart's chambers and valves are working. This procedure takes approximately one hour. There are no restrictions for this procedure. Please do NOT wear cologne, perfume, aftershave, or lotions (deodorant is allowed). Please arrive 15 minutes prior to your appointment time.    Follow-Up: 6 months with echo prior

## 2023-06-18 NOTE — Progress Notes (Unsigned)
 Patient ID: Manuel Chapman MRN: [id number] DOB/AGE: 1945-12-11 78 y.o.  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  BMI 34    HISTORY OF PRESENT ILLNESS:  September 2023: The patient is a 78 y.o. male with the indicated medical history here for hospital follow-up.  The patient was admitted in May 2023 with a possible GI bleed.  He was was relatively bradycardic with possible Mobitz type II or type III block with heart rates in the 30s.  AV nodal blocking agents were discontinued.  An echocardiogram was performed which demonstrated moderate to severe aortic stenosis.  During that evaluation it was thought that he was a marginal candidate for an aortic valve intervention and a plan was advised for him to follow-up in 3 months to assess further.  He was seen in in the [facility] clinic in June and was noted to be able to ambulate with a walker.  He was found to be mildly fluid overloaded and Lasix was started.  A monitor had been placed which demonstrated no atrial fibrillation with occasional type I and second-degree type II block seen during nocturnal hours.  Nonsustained ventricular tachycardia was also present.  He was seen in the [facility] clinic in July.  He denied any resting  or exertional dyspnea in his weight was noted to be decreased by 10 pounds.  Given a equivocal PYP study he was referred for a cardiac MRI especially given his conduction disease.  The patient is here with his son-in-law.  He remains at the rehabilitation center.  He is relatively asymptomatic.  He is much more conversant and engaged and is alert and oriented x3 today versus when I saw him in the hospital.  He does not do much in a day.  It seems like he is relatively sedentary.  He however denies any dyspnea on transferring from his wheelchair to his bed.  He denies any exertional angina.  He has had no presyncope or syncope.  He has required no emergency room visits or hospitalizations fortunately.  He feels relatively well.  There are some question as to how whether he is getting stronger or not.  He tells me that he is walking with a walker however his son-in-law is unsure and has never seen him doing this.  He still has left upper extremity motor limitations from his stroke.  He has no teeth and no dentures.  Plan: Continue to monitor symptoms and follow-up in 3 months.  January 2023: In the interim he was seen in the [facility] clinic in late December and was doing very well.  He is still at [employer].  He was walking with a walker without angina, severe dyspnea, presyncope, or syncope.  He is compliant with all of his medications.  He unfortunately is here without family.  He is mostly in a wheelchair during the day.  He however denies any  shortness of breath, presyncope, or exertional angina.  He is not that active on a regular basis per his own admission.  He fortunately has not required any emergency room visits or hospitalizations. Plan: Follow up 6 months with TTE; marginal candidate for aortic valve intervention.  August 2024:  In the interim the patient was seen in the heart failure clinic in February and was doing well.  He was noted to be mostly wheelchair-bound at that appointment.   Echocardiogram done today shows moderate to severe aortic stenosis with a preserved LV function.  He is here with his family.  He continues to do well.  He is at an assisted living center and he does not do much at all by his own admission.  He denies any shortness of breath or chest pain.  He has required no hospitalizations or emergency room visits.  He feels fine doing very little.  It is unclear whether he understands exactly how significant his aortic valvular disease is and how this will affect his health in the future.  Right now he would like to avoid any procedures.  Plan: Monitor for symptoms, follow-up in 6 months.  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  Family History  Problem Relation Age of Onset   Diabetes Neg Hx    Stroke Neg Hx     Social History   Socioeconomic History   Marital status: Married    Spouse name: Gaylyn Rong   Number of children: 4   Years of education: Not on file   Highest education level: High school graduate  Occupational History   Occupation: retired  Tobacco Use   Smoking status: Former    Current packs/day: 0.00    Average packs/day: 1 pack/day for 10.0 years (10.0 ttl pk-yrs)    Types: Cigarettes    Start date: 9    Quit date: 1998    Years since quitting: 27.1   Smokeless tobacco: Never  Vaping Use   Vaping status: Never Used  Substance and Sexual Activity   Alcohol use: Not Currently    Comment: not much, h/o heavy beer use   Drug use: No   Sexual activity: Not on file  Other Topics Concern   Not on file  Social History Narrative   Not on file   Social Drivers  of Health   Financial Resource Strain: Low Risk  (09/17/2021)   Overall Financial Resource Strain (CARDIA)    Difficulty of Paying Living Expenses: Not hard at all  Food Insecurity: No Food Insecurity (09/17/2021)   Hunger Vital Sign    Worried About Running Out of Food in the Last Year: Never true    Ran Out of Food in the Last Year: Never true  Transportation Needs: No Transportation Needs (09/17/2021)   PRAPARE - Administrator, Civil Service (Medical): No    Lack of Transportation (Non-Medical): No  Physical Activity: Not on file  Stress: Not on file  Social Connections: Not on file  Intimate Partner Violence: Not on file     Prior to Admission medications   Medication Sig Start Date End Date Taking?  Authorizing Provider  acetaminophen (TYLENOL) 325 MG tablet Take 2 tablets (650 mg total) by mouth every 4 (four) hours as needed for mild pain (or temp > 37.5 C (99.5 F)). 05/09/21   Elgergawy, Leana Roe, MD  allopurinol (ZYLOPRIM) 300 MG tablet Take 300 mg by mouth daily. Patient not taking: Reported on 01/04/2022    [provider]  atorvastatin (LIPITOR) 80 MG tablet Take 1 tablet (80 mg total) by mouth daily. 06/22/21   Angiulli, Mcarthur Rossetti, PA-C  cholecalciferol (VITAMIN D3) 25 MCG (1000 UNIT) tablet Take 1,000 Units by mouth daily.    [provider]  cloNIDine (CATAPRES) 0.2 MG tablet Take 1 tablet (0.2 mg total) by mouth 3 (three) times daily. 06/22/21   Angiulli, Mcarthur Rossetti, PA-C  clopidogrel (PLAVIX) 75 MG tablet Take 1 tablet (75 mg total) by mouth daily. 05/10/21   Elgergawy, Leana Roe, MD  feeding supplement, GLUCERNA SHAKE, (GLUCERNA SHAKE) LIQD Take 237 mLs by mouth 2 (two) times daily between meals.    [provider]  ferrous sulfate 325 (65 FE) MG tablet Take 1 tablet (325 mg total) by mouth 2 (two) times daily with a meal. 09/18/21   Rhetta Mura, MD  furosemide (LASIX) 20 MG tablet Take 1 tablet (20 mg total) by mouth daily. 09/25/21    Robbie Lis M, PA-C  hydrALAZINE (APRESOLINE) 100 MG tablet Take 100 mg by mouth 3 (three) times daily.    [provider]  insulin detemir (LEVEMIR) 100 UNIT/ML injection Inject 0.12 mLs (12 Units total) into the skin daily. Patient taking differently: Inject 10-22 Units into the skin See admin instructions. Inject 22 units subcutaneous at 0800, then inject 10 units subcutaneous at 2000 per [facility] 07/02/21   Angiulli, Mcarthur Rossetti, PA-C  lisinopril (ZESTRIL) 40 MG tablet Take 1 tablet (40 mg total) by mouth daily. 12/05/19   Arline Asp, NP  losartan (COZAAR) 25 MG tablet Take 0.5 tablets (12.5 mg total) by mouth at bedtime. 01/04/22 07/03/22  Jacklynn Ganong, FNP  Multiple Vitamin (MULTIVITAMIN WITH MINERALS) TABS tablet Take 1 tablet by mouth daily. 05/10/21   Elgergawy, Leana Roe, MD  ondansetron (ZOFRAN) 4 MG tablet Take 4 mg by mouth every 8 (eight) hours as needed. 08/10/21   [provider]  potassium chloride SA (KLOR-CON M) 20 MEQ tablet Take 1 tablet (20 mEq total) by mouth daily. 09/25/21   Robbie Lis M, PA-C    No Known Allergies  REVIEW OF SYSTEMS:  General: no fevers/chills/night sweats Eyes: no blurry vision, diplopia, or amaurosis ENT: no sore throat or hearing loss Resp: no cough, wheezing, or hemoptysis CV: no edema or palpitations GI: no abdominal pain, nausea, vomiting, diarrhea, or constipation GU: no dysuria, frequency, or hematuria Skin: no rash Neuro: no headache, numbness, tingling, or weakness of extremities Musculoskeletal: no joint pain or swelling Heme: no bleeding, DVT, or easy bruising Endo: no polydipsia or polyuria  There were no vitals taken for this visit.  PHYSICAL EXAM: GEN:  AO x 3 in no acute distress HEENT: normal Dentition: Lacking all teeth Neck: JVP normal. +1 carotid upstrokes without bruits. No thyromegaly. Lungs: equal expansion, clear bilaterally CV: Apex is discrete and nondisplaced, RRR with 3/6 crescendo  decrescendo murmur Abd: soft, non-tender, non-distended; no bruit; positive bowel sounds Ext: no edema, ecchymoses, or cyanosis Vascular: 2+ femoral pulses, 2+ radial pulses       Skin: warm and dry without rash Neuro: Left upper extremity weakness    DATA AND  STUDIES:  EKG: May 2023 sinus rhythm with first-degree AV block and occasional PACs   2D ECHO: May 2023 ejection fraction of 55 to 60% with severe aortic stenosis with a peak velocity of 4.6 m/s, mean gradient of 39 mmHg, and aortic valve area of 0.88 cm with no other significant valvular abnormalities   CARDIAC CATH: n/a   STS RISK CALCULATOR:  Procedure: AV Replacement   Risk of Mortality: 2.427% Renal Failure: 8.820% Permanent Stroke: 1.955% Prolonged Ventilation: 12.005% DSW Infection: 0.286% Reoperation: 3.715% Morbidity or Mortality: 18.385% Short Length of Stay: 18.033% Long Length of Stay: 10.931%   NHYA CLASS: 1    ASSESSMENT AND PLAN:   Nonrheumatic aortic valve stenosis  Chronic diastolic heart failure (HCC)  Acute CVA (cerebrovascular accident) (HCC)  Type 2 diabetes mellitus with complication, with long-term current use of insulin (HCC)  Hypertension associated with diabetes (HCC)  Hyperlipidemia associated with type 2 diabetes mellitus (HCC)  Aortic atherosclerosis (HCC)  BMI 34.0-34.9,adult   Aortic stenosis:*** Chronic diastolic heart failure: Continue Entresto 49 x 51 mg daily, Jardiance 10 mg daily, Lasix 40 mg twice daily, and BiDil equivalent; patient followed by advanced heart failure. History of stroke: Continue Plavix 75 mg, atorvastatin 80 mg, and optimal blood pressure control. Type 2 diabetes mellitus: Continue Plavix 75 mg daily, atorvastatin 80 mg daily, Jardiance 10 mg daily, Entresto 49 x 51 mg twice daily. Hypertension:*** Hyperlipidemia: Continue atorvastatin 80 mg daily; given patient's history of stroke goal LDL is less than 55. Aortic atherosclerosis:  Continue atorvastatin 80 mg daily, Plavix 75 mg control. Elevated BMI: Patient is probably an ideal candidate for GLP-1 receptor agonist therapy  Orbie Pyo, MD  06/18/2023 12:20 PM    [facility] [address] [place], [place], [place]  [address] Phone: [phone]; Fax: [phone]

## 2023-06-23 ENCOUNTER — Ambulatory Visit (HOSPITAL_BASED_OUTPATIENT_CLINIC_OR_DEPARTMENT_OTHER): Payer: Medicare HMO

## 2023-06-23 ENCOUNTER — Encounter: Payer: Self-pay | Admitting: Internal Medicine

## 2023-06-23 ENCOUNTER — Ambulatory Visit (HOSPITAL_COMMUNITY): Payer: Medicare HMO | Attending: Internal Medicine | Admitting: Internal Medicine

## 2023-06-23 VITALS — BP 132/84 | HR 62 | Ht 66.0 in | Wt 212.0 lb

## 2023-06-23 DIAGNOSIS — E1169 Type 2 diabetes mellitus with other specified complication: Secondary | ICD-10-CM

## 2023-06-23 DIAGNOSIS — I152 Hypertension secondary to endocrine disorders: Secondary | ICD-10-CM

## 2023-06-23 DIAGNOSIS — E1159 Type 2 diabetes mellitus with other circulatory complications: Secondary | ICD-10-CM

## 2023-06-23 DIAGNOSIS — Z6834 Body mass index (BMI) 34.0-34.9, adult: Secondary | ICD-10-CM | POA: Diagnosis present

## 2023-06-23 DIAGNOSIS — I700 Atherosclerosis of aorta: Secondary | ICD-10-CM

## 2023-06-23 DIAGNOSIS — I639 Cerebral infarction, unspecified: Secondary | ICD-10-CM

## 2023-06-23 DIAGNOSIS — I5032 Chronic diastolic (congestive) heart failure: Secondary | ICD-10-CM

## 2023-06-23 DIAGNOSIS — E785 Hyperlipidemia, unspecified: Secondary | ICD-10-CM | POA: Diagnosis present

## 2023-06-23 DIAGNOSIS — Z794 Long term (current) use of insulin: Secondary | ICD-10-CM

## 2023-06-23 DIAGNOSIS — I350 Nonrheumatic aortic (valve) stenosis: Secondary | ICD-10-CM | POA: Diagnosis not present

## 2023-06-23 DIAGNOSIS — E118 Type 2 diabetes mellitus with unspecified complications: Secondary | ICD-10-CM | POA: Diagnosis present

## 2023-06-23 LAB — ECHOCARDIOGRAM COMPLETE
AR max vel: 0.88 cm2
AV Area VTI: 0.89 cm2
AV Area mean vel: 0.92 cm2
AV Mean grad: 28 mmHg
AV Peak grad: 51.3 mmHg
Ao pk vel: 3.58 m/s
Area-P 1/2: 3.1 cm2
P 1/2 time: 466 ms
S' Lateral: 3.4 cm

## 2023-06-23 NOTE — Patient Instructions (Signed)
 Medication Instructions:  Your physician recommends that you continue on your current medications as directed. Please refer to the Current Medication list given to you today.  *If you need a refill on your cardiac medications before your next appointment, please call your pharmacy*  Follow-Up: At [facility], you and your health needs are our priority.  As part of our continuing mission to provide you with exceptional heart care, we have created designated Provider Care Teams.  These Care Teams include your primary Cardiologist (physician) and Advanced Practice Providers (APPs -  Physician Assistants and Nurse Practitioners) who all work together to provide you with the care you need, when you need it.  Your next appointment:   6 month(s)  The format for your next appointment:   In Person  Provider:   Tereso Newcomer, PA-C  Other Instructions   1st Floor: - Lobby - Registration  - Pharmacy  - Lab - Cafe  2nd Floor: - PV Lab - Diagnostic Testing (echo, CT, nuclear med)  3rd Floor: - Vacant  4th Floor: - TCTS (cardiothoracic surgery) - AFib Clinic - Structural Heart Clinic - Vascular Surgery  - Vascular Ultrasound  5th Floor: - HeartCare Cardiology (general and EP) - Clinical Pharmacy for coumadin, hypertension, lipid, weight-loss medications, and med management appointments    Valet parking services will be available as well.

## 2024-01-19 ENCOUNTER — Observation Stay
Admission: EM | Admit: 2024-01-19 | Discharge: 2024-01-21 | Disposition: A | Discharge status: Skilled Nursing Facility | Attending: Obstetrics and Gynecology | Admitting: Obstetrics and Gynecology

## 2024-01-19 ENCOUNTER — Other Ambulatory Visit: Payer: Self-pay

## 2024-01-19 DIAGNOSIS — I440 Atrioventricular block, first degree: Secondary | ICD-10-CM | POA: Diagnosis not present

## 2024-01-19 DIAGNOSIS — D649 Anemia, unspecified: Secondary | ICD-10-CM

## 2024-01-19 DIAGNOSIS — Z7902 Long term (current) use of antithrombotics/antiplatelets: Secondary | ICD-10-CM | POA: Diagnosis not present

## 2024-01-19 DIAGNOSIS — D123 Benign neoplasm of transverse colon: Secondary | ICD-10-CM | POA: Diagnosis not present

## 2024-01-19 DIAGNOSIS — K921 Melena: Secondary | ICD-10-CM | POA: Diagnosis present

## 2024-01-19 DIAGNOSIS — Z87891 Personal history of nicotine dependence: Secondary | ICD-10-CM | POA: Insufficient documentation

## 2024-01-19 DIAGNOSIS — Z6834 Body mass index (BMI) 34.0-34.9, adult: Secondary | ICD-10-CM | POA: Insufficient documentation

## 2024-01-19 DIAGNOSIS — I350 Nonrheumatic aortic (valve) stenosis: Secondary | ICD-10-CM | POA: Diagnosis not present

## 2024-01-19 DIAGNOSIS — E119 Type 2 diabetes mellitus without complications: Secondary | ICD-10-CM | POA: Insufficient documentation

## 2024-01-19 DIAGNOSIS — K5730 Diverticulosis of large intestine without perforation or abscess without bleeding: Secondary | ICD-10-CM | POA: Diagnosis not present

## 2024-01-19 DIAGNOSIS — Z794 Long term (current) use of insulin: Secondary | ICD-10-CM | POA: Insufficient documentation

## 2024-01-19 DIAGNOSIS — I110 Hypertensive heart disease with heart failure: Secondary | ICD-10-CM | POA: Diagnosis not present

## 2024-01-19 DIAGNOSIS — Z7901 Long term (current) use of anticoagulants: Secondary | ICD-10-CM | POA: Diagnosis not present

## 2024-01-19 DIAGNOSIS — Z79899 Other long term (current) drug therapy: Secondary | ICD-10-CM | POA: Diagnosis not present

## 2024-01-19 DIAGNOSIS — J449 Chronic obstructive pulmonary disease, unspecified: Secondary | ICD-10-CM | POA: Insufficient documentation

## 2024-01-19 DIAGNOSIS — Z7984 Long term (current) use of oral hypoglycemic drugs: Secondary | ICD-10-CM | POA: Insufficient documentation

## 2024-01-19 DIAGNOSIS — D500 Iron deficiency anemia secondary to blood loss (chronic): Secondary | ICD-10-CM | POA: Insufficient documentation

## 2024-01-19 DIAGNOSIS — Z8673 Personal history of transient ischemic attack (TIA), and cerebral infarction without residual deficits: Secondary | ICD-10-CM | POA: Insufficient documentation

## 2024-01-19 DIAGNOSIS — K317 Polyp of stomach and duodenum: Secondary | ICD-10-CM | POA: Insufficient documentation

## 2024-01-19 DIAGNOSIS — I5020 Unspecified systolic (congestive) heart failure: Secondary | ICD-10-CM | POA: Insufficient documentation

## 2024-01-19 DIAGNOSIS — K625 Hemorrhage of anus and rectum: Principal | ICD-10-CM | POA: Insufficient documentation

## 2024-01-19 DIAGNOSIS — K2980 Duodenitis without bleeding: Secondary | ICD-10-CM | POA: Insufficient documentation

## 2024-01-19 DIAGNOSIS — I69319 Unspecified symptoms and signs involving cognitive functions following cerebral infarction: Secondary | ICD-10-CM | POA: Diagnosis present

## 2024-01-19 DIAGNOSIS — N179 Acute kidney failure, unspecified: Secondary | ICD-10-CM | POA: Insufficient documentation

## 2024-01-19 DIAGNOSIS — I10 Essential (primary) hypertension: Secondary | ICD-10-CM | POA: Diagnosis present

## 2024-01-19 LAB — CBC
HCT: 23.9 % — ABNORMAL LOW (ref 39.0–52.0)
Hemoglobin: 7.8 g/dL — ABNORMAL LOW (ref 13.0–17.0)
MCH: 30.2 pg (ref 26.0–34.0)
MCHC: 32.6 g/dL (ref 30.0–36.0)
MCV: 92.6 fL (ref 80.0–100.0)
Platelets: 262 K/uL (ref 150–400)
RBC: 2.58 MIL/uL — ABNORMAL LOW (ref 4.22–5.81)
RDW: 14.6 % (ref 11.5–15.5)
WBC: 6.7 K/uL (ref 4.0–10.5)
nRBC: 0 % (ref 0.0–0.2)

## 2024-01-19 LAB — BASIC METABOLIC PANEL WITH GFR
Anion gap: 9 (ref 5–15)
BUN: 24 mg/dL — ABNORMAL HIGH (ref 8–23)
CO2: 22 mmol/L (ref 22–32)
Calcium: 7.6 mg/dL — ABNORMAL LOW (ref 8.9–10.3)
Chloride: 109 mmol/L (ref 98–111)
Creatinine, Ser: 1.69 mg/dL — ABNORMAL HIGH (ref 0.61–1.24)
GFR, Estimated: 41 mL/min — ABNORMAL LOW (ref >60)
Glucose, Bld: 133 mg/dL — ABNORMAL HIGH (ref 70–99)
Potassium: 4.3 mmol/L (ref 3.5–5.1)
Sodium: 140 mmol/L (ref 135–145)

## 2024-01-19 LAB — RETICULOCYTES
Immature Retic Fract: 14.8 % (ref 2.3–15.9)
RBC.: 2.49 MIL/uL — ABNORMAL LOW (ref 4.22–5.81)
Retic Count, Absolute: 89.6 K/uL (ref 19.0–186.0)
Retic Ct Pct: 3.6 % — ABNORMAL HIGH (ref 0.4–3.1)

## 2024-01-19 LAB — IRON AND TIBC
Iron: 50 ug/dL (ref 45–182)
Saturation Ratios: 24 % (ref 17.9–39.5)
TIBC: 210 ug/dL — ABNORMAL LOW (ref 250–450)
UIBC: 160 ug/dL

## 2024-01-19 LAB — VITAMIN B12: Vitamin B-12: 537 pg/mL (ref 180–914)

## 2024-01-19 LAB — TYPE AND SCREEN
ABO/RH(D): O POS
Antibody Screen: NEGATIVE

## 2024-01-19 LAB — GLUCOSE, CAPILLARY: Glucose-Capillary: 111 mg/dL — ABNORMAL HIGH (ref 70–99)

## 2024-01-19 LAB — FOLATE: Folate: 8 ng/mL (ref >5.9)

## 2024-01-19 LAB — HEMOGLOBIN A1C
Hgb A1c MFr Bld: 5 % (ref 4.8–5.6)
Mean Plasma Glucose: 96.8 mg/dL

## 2024-01-19 LAB — FERRITIN: Ferritin: 99 ng/mL (ref 24–336)

## 2024-01-19 MED ORDER — ACETAMINOPHEN 650 MG RE SUPP: 650.0000 mg | Freq: Four times a day (QID) | RECTAL | Status: DC | PRN

## 2024-01-19 MED ORDER — SODIUM CHLORIDE 0.9 % IV SOLN: INTRAVENOUS | Status: DC

## 2024-01-19 MED ORDER — FLUOXETINE HCL 10 MG PO CAPS
10.0000 mg | ORAL_CAPSULE | Freq: Every day | ORAL | Status: DC
  Administered 2024-01-20 – 2024-01-21 (×2): 10 mg via ORAL
  Filled 2024-01-19 (×2): qty 1

## 2024-01-19 MED ORDER — ISOSORBIDE DINITRATE 10 MG PO TABS
5.0000 mg | ORAL_TABLET | Freq: Three times a day (TID) | ORAL | Status: DC
  Administered 2024-01-19 – 2024-01-21 (×5): 5 mg via ORAL
  Filled 2024-01-19 (×6): qty 0.5

## 2024-01-19 MED ORDER — PANTOPRAZOLE SODIUM 40 MG IV SOLR
40.0000 mg | Freq: Once | INTRAVENOUS | Status: AC
  Administered 2024-01-19: 40 mg via INTRAVENOUS
  Filled 2024-01-19: qty 10

## 2024-01-19 MED ORDER — ALBUTEROL SULFATE (2.5 MG/3ML) 0.083% IN NEBU: 2.5000 mg | INHALATION_SOLUTION | RESPIRATORY_TRACT | Status: DC | PRN

## 2024-01-19 MED ORDER — HYDROCODONE-ACETAMINOPHEN 5-325 MG PO TABS: 1.0000 | ORAL_TABLET | ORAL | Status: DC | PRN

## 2024-01-19 MED ORDER — ONDANSETRON HCL 4 MG/2ML IJ SOLN: 4.0000 mg | Freq: Four times a day (QID) | INTRAMUSCULAR | Status: DC | PRN

## 2024-01-19 MED ORDER — MORPHINE SULFATE (PF) 2 MG/ML IV SOLN: 2.0000 mg | INTRAVENOUS | Status: DC | PRN

## 2024-01-19 MED ORDER — ATORVASTATIN CALCIUM 20 MG PO TABS
80.0000 mg | ORAL_TABLET | Freq: Every day | ORAL | Status: DC
  Administered 2024-01-20 – 2024-01-21 (×2): 80 mg via ORAL
  Filled 2024-01-19 (×2): qty 4

## 2024-01-19 MED ORDER — ACETAMINOPHEN 325 MG PO TABS: 650.0000 mg | ORAL_TABLET | Freq: Four times a day (QID) | ORAL | Status: DC | PRN

## 2024-01-19 MED ORDER — INSULIN ASPART 100 UNIT/ML IJ SOLN: 0.0000 [IU] | INTRAMUSCULAR | Status: DC

## 2024-01-19 MED ORDER — DONEPEZIL HCL 5 MG PO TABS
5.0000 mg | ORAL_TABLET | Freq: Every day | ORAL | Status: DC
  Administered 2024-01-20 – 2024-01-21 (×2): 5 mg via ORAL
  Filled 2024-01-19 (×2): qty 1

## 2024-01-19 MED ORDER — ONDANSETRON HCL 4 MG PO TABS: 4.0000 mg | ORAL_TABLET | Freq: Four times a day (QID) | ORAL | Status: DC | PRN

## 2024-01-19 NOTE — Assessment & Plan Note (Addendum)
 Iron deficiency due to chronic blood loss, suspect chronic, possibly acute on chronic We will keep n.p.o. after midnight SCDs for DVT prophylaxis H&H and transfuse if hemoglobin falls under 7 Anemia panel ordered Plavix  reportedly recently held at his facility GI has been consulted for consideration of endoscopy

## 2024-01-19 NOTE — Assessment & Plan Note (Signed)
Blood sugar controlled.  Sliding scale insulin coverage

## 2024-01-19 NOTE — Assessment & Plan Note (Signed)
 Improved EF 45--> 50% on echo 2/25 Clinically euvolemic Will hold furosemide , hydralazine  and Entresto  in the setting of GI bleed Cautiously continue Imdur 

## 2024-01-19 NOTE — Assessment & Plan Note (Addendum)
 Delirium precautions Continue Aricept  and fluoxetine 

## 2024-01-19 NOTE — Assessment & Plan Note (Signed)
 Plavix  on hold Continue statins

## 2024-01-19 NOTE — Assessment & Plan Note (Signed)
 Moderate stenosis on echo February 2025 Caution with aggressive hydration/dehydration

## 2024-01-19 NOTE — H&P (Signed)
 History and Physical    Patient: Manuel Chapman FMW:[id number] DOB: 1945-11-23 DOA: 01/19/2024 DOS: the patient was seen and examined on 01/19/2024 PCP: Henry Ingle, MD  Patient coming from: SNF  Chief Complaint:  Chief Complaint  Patient presents with   Rectal Bleeding    HPI: Manuel Chapman is a 78 y.o. male with medical history significant for HFrEF (EF 45 to 50%), moderate aortic stenosis(05/2023), cerebellar stroke (2023), subarachnoid hemorrhage (2021), HTN, IDDM,  being admitted with possible GI bleed and anemia of 7.8.  He presented from his facility after his family noted that he had dark stool.  Triage note also documents intermittent rectal bleeding for a week, for which his blood thinners' (suspect clopidogrel ) were stopped.  Hemoglobin checked at the facility was 7.7 (9-11 in 2024).  No documented history of colonoscopy or endoscopy.  Noted his hospitalization 2023 when he was noted to have dark stool but no endoluminal evaluation was done as it cleared up spontaneously.   In the ED, hemodynamically stable.  Labs notable for hemoglobin 7.8, slightly improved from 7.7 on earlier check at the facility.  Creatinine 1.69, up from 0.91 a year ago. No imaging done. 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 No pertinent surgical history. Social History:  reports that he quit smoking about 27 years ago. His smoking use included cigarettes. He started smoking about 37 years ago. He has a 10 pack-year smoking  history. He has never used smokeless tobacco. He reports that he does not currently use alcohol. He reports that he does not use drugs.  No Known Allergies  Family History  Problem Relation Age of Onset   Diabetes Neg Hx    Stroke Neg Hx     Prior to Admission medications   Medication Sig Start Date End Date Taking? Authorizing Provider  acetaminophen  (TYLENOL ) 325 MG tablet Take 2 tablets (650 mg total) by mouth every 4 (four) hours as needed for mild pain (or temp > 37.5 C (99.5 F)). 05/09/21   Elgergawy, Brayton RAMAN, MD  atorvastatin  (LIPITOR ) 80 MG tablet Take 1 tablet (80 mg total) by mouth daily. 06/22/21   Angiulli, Toribio PARAS, PA-C  BIOFREEZE COOL THE PAIN 4 % GEL Apply topically. 05/24/23   [provider]  Cholecalciferol (VITAMIN D3) 125 MCG (5000 UT) CAPS Take 1 capsule by mouth daily. 04/21/23   [provider]  clopidogrel  (PLAVIX ) 75 MG tablet Take 1 tablet (75 mg total) by mouth daily. 05/10/21   Elgergawy, Brayton RAMAN, MD  donepezil  (ARICEPT ) 5 MG tablet Take 5 mg by mouth daily. 06/16/23   [provider]  empagliflozin  (JARDIANCE ) 10 MG TABS tablet Take 1 tablet (10 mg total) by mouth daily before breakfast. 05/07/22   Milford, Harlene HERO, FNP  ENTRESTO  49-51 MG Take 1 tablet by mouth 2 (two) times daily. 05/07/22   [provider]  escitalopram (LEXAPRO) 10 MG tablet Take 10 mg by mouth daily. 12/06/22   [provider]  feeding supplement, GLUCERNA SHAKE, (GLUCERNA SHAKE) LIQD Take 237 mLs  by mouth 2 (two) times daily between meals.    [provider]  ferrous sulfate  324 (65 Fe) MG TBEC Take 1 tablet by mouth 2 (two) times daily. 04/21/23   [provider]  FLUoxetine  (PROZAC ) 10 MG capsule Take 10 mg by mouth daily. 06/16/23   [provider]  furosemide  (LASIX ) 40 MG tablet Take 40 mg by mouth daily. 06/16/23   [provider]  hydrALAZINE  (APRESOLINE ) 100 MG tablet Take 100 mg by mouth 3 (three) times  daily.    [provider]  Insulin  Aspart FlexPen (NOVOLOG ) 100 UNIT/ML  06/12/23   [provider]  insulin  glargine-yfgn (SEMGLEE ) 100 UNIT/ML Pen  05/31/23   [provider]  isosorbide  dinitrate (ISORDIL ) 5 MG tablet Take 5 mg by mouth 3 (three) times daily. 06/16/23   [provider]  isosorbide  mononitrate (ISMO ) 10 MG tablet Take by mouth. Patient not taking: Reported on 06/23/2023 04/07/23   [provider]  LANTUS  SOLOSTAR 100 UNIT/ML Solostar Pen Inject into the skin. 06/20/23   [provider]  Multiple Vitamin (MULTIVITAMIN WITH MINERALS) TABS tablet Take 1 tablet by mouth daily. 05/10/21   Elgergawy, Brayton RAMAN, MD  ondansetron  (ZOFRAN ) 8 MG tablet Take by mouth. 06/12/23   [provider]  potassium chloride  SA (KLOR-CON  M) 20 MEQ tablet Take 1 tablet (20 mEq total) by mouth daily. 09/25/21   Marcine Caffie HERO, PA-C    Physical Exam: Vitals:   01/19/24 1752 01/19/24 1755  BP: (!) 157/69   Pulse: 72   Resp: 20   Temp: 98.8 F (37.1 C)   SpO2: 100%   Weight:  96.2 kg  Height:  5' 6 (1.676 m)   Physical Exam Vitals and nursing note reviewed.  Constitutional:      General: He is sleeping. He is not in acute distress. HENT:     Head: Normocephalic and atraumatic.  Cardiovascular:     Rate and Rhythm: Normal rate and regular rhythm.     Heart sounds: Normal heart sounds.  Pulmonary:     Effort: Pulmonary effort is normal.     Breath sounds: Normal breath sounds.  Abdominal:     Palpations: Abdomen is soft.     Tenderness: There is no abdominal tenderness.  Neurological:     Mental Status: He is easily aroused. Mental status is at baseline.     Labs on Admission: I have personally reviewed following labs and imaging studies  CBC: Recent Labs  Lab 01/19/24 1756  WBC 6.7  HGB 7.8*  HCT 23.9*  MCV 92.6  PLT 262   Basic Metabolic Panel: Recent Labs  Lab 01/19/24 1756  NA 140  K 4.3  CL 109  CO2 22   GLUCOSE 133*  BUN 24*  CREATININE 1.69*  CALCIUM  7.6*   GFR: Estimated Creatinine Clearance: 37.9 mL/min (A) (by C-G formula based on SCr of 1.69 mg/dL (H)). Liver Function Tests: No results for input(s): AST, ALT, ALKPHOS, BILITOT, PROT, ALBUMIN in the last 168 hours. No results for input(s): LIPASE, AMYLASE in the last 168 hours. No results for input(s): AMMONIA in the last 168 hours. Coagulation Profile: No results for input(s): INR, PROTIME in the last 168 hours. Cardiac Enzymes: No results for input(s): CKTOTAL, CKMB, CKMBINDEX, TROPONINI in the last 168 hours. BNP (last 3 results) No results for input(s): PROBNP in the last 8760 hours. HbA1C: No results for input(s): HGBA1C in the last 72 hours. CBG: No results for input(s): GLUCAP  in the last 168 hours. Lipid Profile: No results for input(s): CHOL, HDL, LDLCALC, TRIG, CHOLHDL, LDLDIRECT in the last 72 hours. Thyroid Function Tests: No results for input(s): TSH, T4TOTAL, FREET4, T3FREE, THYROIDAB in the last 72 hours. Anemia Panel: No results for input(s): VITAMINB12, FOLATE, FERRITIN, TIBC, IRON, RETICCTPCT in the last 72 hours. Urine analysis:    Component Value Date/Time   COLORURINE YELLOW 09/15/2021 2200   APPEARANCEUR CLEAR 09/15/2021 2200   LABSPEC 1.011 09/15/2021 2200   PHURINE 5.0 09/15/2021 2200   GLUCOSEU NEGATIVE 09/15/2021 2200   HGBUR NEGATIVE 09/15/2021 2200   BILIRUBINUR NEGATIVE 09/15/2021 2200   KETONESUR NEGATIVE 09/15/2021 2200   PROTEINUR 100 (A) 09/15/2021 2200   UROBILINOGEN 0.2 05/03/2011 1754   NITRITE NEGATIVE 09/15/2021 2200   LEUKOCYTESUR NEGATIVE 09/15/2021 2200    Radiological Exams on Admission: No results found. Data Reviewed for HPI: Relevant notes from primary care and specialist visits, past discharge summaries as available in EHR, including Care Everywhere. Prior diagnostic testing as pertinent to current  admission diagnoses Updated medications and problem lists for reconciliation ED course, including vitals, labs, imaging, treatment and response to treatment Triage notes, nursing and pharmacy notes and ED provider's notes Notable results as noted above in HPI      Assessment and Plan: * Hematochezia Iron deficiency due to chronic blood loss, suspect chronic, possibly acute on chronic We will keep n.p.o. after midnight SCDs for DVT prophylaxis H&H and transfuse if hemoglobin falls under 7 Anemia panel ordered Plavix  reportedly recently held at his facility GI has been consulted for consideration of endoscopy  AKI, presumed (acute kidney injury) Presuming acute kidney injury from ATN, probably from poor oral intake, hypovolemia related to GI bleed Creatinine today 1.69, most recently 0.91 a year ago Will give some IV fluids Monitor renal function and avoid nephrotoxins  HFrEF (heart failure with reduced ejection fraction) (HCC) Improved EF 45--> 50% on echo 2/25 Clinically euvolemic Will hold furosemide , hydralazine  and Entresto  in the setting of GI bleed Cautiously continue Imdur   History of cerebellar stroke(2023), subarachnoid hemorrhage(2021) Plavix  on hold Continue statins  Aortic stenosis, moderate Moderate stenosis on echo February 2025 Caution with aggressive hydration/dehydration  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 Observation status is judged to be reasonable and necessary in order to provide the required intensity of service to ensure the patient's safety. The patient's presenting symptoms, physical exam findings, and initial radiographic and laboratory data in the context of their medical condition is felt to place them at decreased risk for further clinical deterioration. Furthermore, it is anticipated that the patient will be medically stable for discharge from the hospital within 2 midnights of admission.   Author: Delayne LULLA Solian, MD 01/19/2024 9:01 PM  For on call review [email or web address].

## 2024-01-19 NOTE — Assessment & Plan Note (Signed)
 Presuming acute kidney injury from ATN, probably from poor oral intake, hypovolemia related to GI bleed Creatinine today 1.69, most recently 0.91 a year ago Will give some IV fluids Monitor renal function and avoid nephrotoxins

## 2024-01-19 NOTE — ED Triage Notes (Signed)
 Pt to ED [facility] from [facility] for rectal bleeding x1 week. Lab work from facility shows hgb 7.7 on 9/24. Recently stopped blood thinners d/t bleeding. Pt denies complaints.

## 2024-01-19 NOTE — ED Provider Notes (Signed)
 [facility] Provider Note    Event Date/Time   First MD Initiated Contact with Patient 01/19/24 1834     (approximate)   History   Rectal Bleeding   HPI  Manuel Chapman is a 78 year old male presenting to the emergency department for evaluation of anemia and rectal bleeding.  Patient accompanied by family who primarily provides history.  They report that they were notified by the patient's facility that his hemoglobin level was low.  They noted that he probably needs to be evaluated by GI, but would possibly need a blood transfusion.  They report that they were told that patient's stool had blood in it and had been Hemoccult tested, but they have not seen patient's stool themselves to know what it looks like.  Patient denies abdominal pain or other complaints.  Hemoglobin at facility reportedly 7.7.  Physical Exam   Triage Vital Signs: ED Triage Vitals  Encounter Vitals Group     BP 01/19/24 1752 (!) 157/69     Girls Systolic BP Percentile --      Girls Diastolic BP Percentile --      Boys Systolic BP Percentile --      Boys Diastolic BP Percentile --      Pulse Rate 01/19/24 1752 72     Resp 01/19/24 1752 20     Temp 01/19/24 1752 98.8 F (37.1 C)     Temp src --      SpO2 01/19/24 1752 100 %     Weight 01/19/24 1755 212 lb (96.2 kg)     Height 01/19/24 1755 5' 6 (1.676 m)     Head Circumference --      Peak Flow --      Pain Score 01/19/24 1755 0     Pain Loc --      Pain Education --      Exclude from Growth Chart --     Most recent vital signs: Vitals:   01/19/24 1752  BP: (!) 157/69  Pulse: 72  Resp: 20  Temp: 98.8 F (37.1 C)  SpO2: 100%     General: Awake, interactive  CV:  Regular rate, good peripheral perfusion.  Resp:  Unlabored respirations, lungs clear to auscultation Abd:  Nondistended, soft, no appreciable tenderness, dark brown soft stool on rectal exam, Hemoccult positive Neuro:  Symmetric facial movement,  fluid speech   ED Results / Procedures / Treatments   Labs (all labs ordered are listed, but only abnormal results are displayed) Labs Reviewed  CBC - Abnormal; Notable for the following components:      Result Value   RBC 2.58 (*)    Hemoglobin 7.8 (*)    HCT 23.9 (*)    All other components within normal limits  BASIC METABOLIC PANEL WITH GFR - Abnormal; Notable for the following components:   Glucose, Bld 133 (*)    BUN 24 (*)    Creatinine, Ser 1.69 (*)    Calcium  7.6 (*)    GFR, Estimated 41 (*)    All other components within normal limits  TYPE AND SCREEN     EKG EKG independently reviewed and interpreted by myself demonstrates:    RADIOLOGY Imaging independently reviewed and interpreted by myself demonstrates:   Formal Radiology Read:  No results found.  PROCEDURES:  Critical Care performed: Yes, see critical care procedure note(s)  CRITICAL CARE Performed by: Nilsa Dade   Total critical care time: 32 minutes  Critical care time was  exclusive of separately billable procedures and treating other patients.  Critical care was necessary to treat or prevent imminent or life-threatening deterioration.  Critical care was time spent personally by me on the following activities: development of treatment plan with patient and/or surrogate as well as nursing, discussions with consultants, evaluation of patient's response to treatment, examination of patient, obtaining history from patient or surrogate, ordering and performing treatments and interventions, ordering and review of laboratory studies, ordering and review of radiographic studies, pulse oximetry and re-evaluation of patient's condition.   Procedures   MEDICATIONS ORDERED IN ED: Medications  pantoprazole  (PROTONIX ) injection 40 mg (has no administration in time range)     IMPRESSION / MDM / ASSESSMENT AND PLAN / ED COURSE  I reviewed the triage vital signs and the nursing notes.  Differential  diagnosis includes, but is not limited to, GI bleed including both upper and lower GI bleed sources, medication adverse effect  Patient's presentation is most consistent with acute presentation with potential threat to life or bodily function.  78 year old male presenting to the emergency department for evaluation of rectal bleeding and anemia.  Stable vitals on presentation.  Labs here confirm anemia with hemoglobin of 7.8.  Most recent labs available for comparison are from over a year ago, but multiple prior values with baseline around 9-10 at that time.  Given anemia with rectal bleeding, do think patient is appropriate for admission.  Type and screen sent.  Will reach out to hospitalist team.  Clinical Course as of 01/19/24 2026  Thu Jan 19, 2024  2025 Case discussed with Dr. Cleatus.  She will evaluate for anticipated admission. [NR]    Clinical Course User Index [NR] Levander Slate, MD     FINAL CLINICAL IMPRESSION(S) / ED DIAGNOSES   Final diagnoses:  Rectal bleeding  Anemia, unspecified type     Rx / DC Orders   ED Discharge Orders     None        Note:  This document was prepared using Dragon voice recognition software and may include unintentional dictation errors.   Levander Slate, MD 01/19/24 2026

## 2024-01-19 NOTE — Assessment & Plan Note (Signed)
 Normotensive to slightly elevated Will continue home antihypertensives with monitoring for hypotension in view of GI bleeding

## 2024-01-20 ENCOUNTER — Observation Stay

## 2024-01-20 ENCOUNTER — Observation Stay: Admitting: Anesthesiology

## 2024-01-20 ENCOUNTER — Encounter: Payer: Self-pay | Admitting: Internal Medicine

## 2024-01-20 ENCOUNTER — Encounter
Admission: EM | Disposition: A | Discharge status: Skilled Nursing Facility | Payer: Self-pay | Source: Home / Self Care | Attending: Emergency Medicine

## 2024-01-20 DIAGNOSIS — D649 Anemia, unspecified: Secondary | ICD-10-CM | POA: Diagnosis not present

## 2024-01-20 DIAGNOSIS — K2289 Other specified disease of esophagus: Secondary | ICD-10-CM | POA: Diagnosis not present

## 2024-01-20 DIAGNOSIS — K3189 Other diseases of stomach and duodenum: Secondary | ICD-10-CM

## 2024-01-20 DIAGNOSIS — K921 Melena: Secondary | ICD-10-CM | POA: Diagnosis not present

## 2024-01-20 DIAGNOSIS — K625 Hemorrhage of anus and rectum: Secondary | ICD-10-CM | POA: Diagnosis not present

## 2024-01-20 DIAGNOSIS — I5020 Unspecified systolic (congestive) heart failure: Secondary | ICD-10-CM | POA: Diagnosis not present

## 2024-01-20 DIAGNOSIS — K2980 Duodenitis without bleeding: Secondary | ICD-10-CM

## 2024-01-20 HISTORY — PX: ESOPHAGOGASTRODUODENOSCOPY: SHX5428

## 2024-01-20 LAB — GLUCOSE, CAPILLARY
Glucose-Capillary: 62 mg/dL — ABNORMAL LOW (ref 70–99)
Glucose-Capillary: 127 mg/dL — ABNORMAL HIGH (ref 70–99)
Glucose-Capillary: 41 mg/dL — CL (ref 70–99)
Glucose-Capillary: 44 mg/dL — CL (ref 70–99)
Glucose-Capillary: 117 mg/dL — ABNORMAL HIGH (ref 70–99)
Glucose-Capillary: 106 mg/dL — ABNORMAL HIGH (ref 70–99)
Glucose-Capillary: 85 mg/dL (ref 70–99)
Glucose-Capillary: 63 mg/dL — ABNORMAL LOW (ref 70–99)
Glucose-Capillary: 142 mg/dL — ABNORMAL HIGH (ref 70–99)
Glucose-Capillary: 198 mg/dL — ABNORMAL HIGH (ref 70–99)

## 2024-01-20 LAB — BASIC METABOLIC PANEL WITH GFR
Anion gap: 6 (ref 5–15)
BUN: 20 mg/dL (ref 8–23)
CO2: 23 mmol/L (ref 22–32)
Calcium: 7.6 mg/dL — ABNORMAL LOW (ref 8.9–10.3)
Chloride: 113 mmol/L — ABNORMAL HIGH (ref 98–111)
Creatinine, Ser: 1.25 mg/dL — ABNORMAL HIGH (ref 0.61–1.24)
GFR, Estimated: 58 mL/min — ABNORMAL LOW (ref >60)
Glucose, Bld: 84 mg/dL (ref 70–99)
Potassium: 4.4 mmol/L (ref 3.5–5.1)
Sodium: 142 mmol/L (ref 135–145)

## 2024-01-20 LAB — HEMOGLOBIN: Hemoglobin: 7.2 g/dL — ABNORMAL LOW (ref 13.0–17.0)

## 2024-01-20 SURGERY — EGD (ESOPHAGOGASTRODUODENOSCOPY)
Anesthesia: General

## 2024-01-20 MED ORDER — NA SULFATE-K SULFATE-MG SULF 17.5-3.13-1.6 GM/177ML PO SOLN
1.0000 | Freq: Once | ORAL | Status: AC
  Administered 2024-01-20: 354 mL via ORAL
  Filled 2024-01-20: qty 1

## 2024-01-20 MED ORDER — DEXTROSE 50 % IV SOLN: 1.0000 | Freq: Once | INTRAVENOUS | Status: AC

## 2024-01-20 MED ORDER — EMPAGLIFLOZIN 10 MG PO TABS
10.0000 mg | ORAL_TABLET | Freq: Every day | ORAL | Status: DC
  Administered 2024-01-21: 10 mg via ORAL
  Filled 2024-01-20: qty 1

## 2024-01-20 MED ORDER — SODIUM CHLORIDE 0.9 % IV SOLN: INTRAVENOUS | Status: AC

## 2024-01-20 MED ORDER — GLUCAGON HCL RDNA (DIAGNOSTIC) 1 MG IJ SOLR: 1.0000 mg | Freq: Once | INTRAMUSCULAR | Status: DC | PRN

## 2024-01-20 MED ORDER — PROPOFOL 10 MG/ML IV BOLUS
INTRAVENOUS | Status: DC | PRN
Start: 2024-01-20 — End: 2024-01-20
  Administered 2024-01-20: 50 mg via INTRAVENOUS

## 2024-01-20 MED ORDER — DEXTROSE-SODIUM CHLORIDE 5-0.45 % IV SOLN: INTRAVENOUS | Status: AC

## 2024-01-20 MED ORDER — FUROSEMIDE 40 MG PO TABS
40.0000 mg | ORAL_TABLET | Freq: Every day | ORAL | Status: DC
  Administered 2024-01-20 – 2024-01-21 (×2): 40 mg via ORAL
  Filled 2024-01-20 (×2): qty 1

## 2024-01-20 MED ORDER — HYDRALAZINE HCL 50 MG PO TABS
100.0000 mg | ORAL_TABLET | Freq: Three times a day (TID) | ORAL | Status: DC
Start: 2024-01-20 — End: 2024-01-21
  Administered 2024-01-20 (×2): 100 mg via ORAL
  Filled 2024-01-20 (×2): qty 2

## 2024-01-20 MED ORDER — DEXTROSE 50 % IV SOLN
INTRAVENOUS | Status: AC
  Administered 2024-01-20: 50 mL via INTRAVENOUS
  Filled 2024-01-20: qty 50

## 2024-01-20 MED ORDER — SACUBITRIL-VALSARTAN 49-51 MG PO TABS
1.0000 | ORAL_TABLET | Freq: Two times a day (BID) | ORAL | Status: DC
  Administered 2024-01-20 – 2024-01-21 (×3): 1 via ORAL
  Filled 2024-01-20 (×3): qty 1

## 2024-01-20 MED ORDER — LIDOCAINE HCL (CARDIAC) PF 100 MG/5ML IV SOSY
PREFILLED_SYRINGE | INTRAVENOUS | Status: DC | PRN
  Administered 2024-01-20: 100 mg via INTRAVENOUS

## 2024-01-20 MED ORDER — DEXTROSE 50 % IV SOLN
INTRAVENOUS | Status: AC
  Administered 2024-01-20: 1 via INTRAVENOUS
  Filled 2024-01-20: qty 50

## 2024-01-20 NOTE — Plan of Care (Signed)
  Problem: Clinical Measurements: Goal: Ability to maintain clinical measurements within normal limits will improve Outcome: Progressing Goal: Will remain free from infection Outcome: Progressing Goal: Diagnostic test results will improve Outcome: Progressing Goal: Respiratory complications will improve Outcome: Progressing Goal: Cardiovascular complication will be avoided Outcome: Progressing   Problem: Education: Goal: Knowledge of General Education information will improve Description: Including pain rating scale, medication(s)/side effects and non-pharmacologic comfort measures Outcome: Progressing   Problem: Metabolic: Goal: Ability to maintain appropriate glucose levels will improve Outcome: Progressing

## 2024-01-20 NOTE — Anesthesia Procedure Notes (Signed)
 Procedure Name: MAC Date/Time: 01/20/2024 9:57 AM  Performed by: Lorrene Camelia LABOR, CRNAPre-anesthesia Checklist: Patient identified, Emergency Drugs available, Suction available and Patient being monitored Patient Re-evaluated:Patient Re-evaluated prior to induction Oxygen Delivery Method: Simple face mask Preoxygenation: Pre-oxygenation with 100% oxygen Induction Type: IV induction Comments: pom

## 2024-01-20 NOTE — TOC Initial Note (Addendum)
 Transition of Care [facility]) - Initial/Assessment Note    Patient Details  Name: Manuel Chapman MRN: [id number] Date of Birth: 07/12/1945  [facility]) CM/SW Contact:    Dalia GORMAN Fuse, RN Phone Number: 01/20/2024, 8:26 AM  Clinical Narrative:                 Patient is from [facility]. TOC called to [facility] and lvmm for [facility] requesting a callback to confirm he can come back to the facility.  TOC received a call from [facility], he is not from that facility. He is from [facility]. TOC spoke with Barnie at [facility] and the patient can return when he is medically ready.  The patient is off the floor at this time.  Expected Discharge Plan: Skilled Nursing Facility Barriers to Discharge: Continued Medical Work up   Patient Goals and CMS Choice            Expected Discharge Plan and Services   Discharge Planning Services: CM Consult   Living arrangements for the past 2 months: Skilled Nursing Facility                                      Prior Living Arrangements/Services Living arrangements for the past 2 months: Skilled Nursing Facility Lives with:: Facility Resident                   Activities of Daily Living   ADL Screening (condition at time of admission) Independently performs ADLs?: Yes (appropriate for developmental age) Is the patient deaf or have difficulty hearing?: No Does the patient have difficulty seeing, even when wearing glasses/contacts?: No Does the patient have difficulty concentrating, remembering, or making decisions?: No  Permission Sought/Granted                  Emotional Assessment           Psych Involvement: Yes (comment)  Admission diagnosis:  Hematochezia [K92.1] Rectal bleeding [K62.5] Anemia, unspecified type [D64.9] Patient Active Problem List   Diagnosis Date Noted   Hematochezia 01/19/2024   AKI, presumed (acute kidney injury) 01/19/2024   History of cerebellar  stroke(2023), subarachnoid hemorrhage(2021) 01/19/2024   HFrEF (heart failure with reduced ejection fraction) (HCC) 01/19/2024   Chronic diastolic heart failure (HCC) 09/24/2021   Elevated alkaline phosphatase level 09/16/2021   Iron deficiency anemia due to chronic blood loss 09/16/2021   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) 09/16/2021   Aortic stenosis, moderate 09/16/2021   Second degree AV block 09/15/2021   Bradycardia 09/15/2021   Labile blood glucose    Uncontrolled type 2 diabetes mellitus with hyperglycemia (HCC)    Essential hypertension    Tendonitis of left rotator cuff    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 [phone] [address], [place] - [address] AT [facility] [address] [place] [address] Phone: [phone] Fax: [phone]  Social Drivers of Health (SDOH) Social History: SDOH Screenings   Food Insecurity: No Food Insecurity (01/19/2024)  Housing: Low Risk  (01/19/2024)  Transportation Needs: No Transportation Needs (01/19/2024)  Utilities: Not At Risk (01/19/2024)  Alcohol Screen: Low Risk  (09/17/2021)  Depression (PHQ2-9): Low Risk  (09/09/2021)  Financial Resource Strain: Low Risk  (09/17/2021)  Social Connections: Unknown (01/19/2024)  Tobacco Use: Medium Risk (01/19/2024)   SDOH Interventions:     Readmission Risk Interventions     No data to display

## 2024-01-20 NOTE — Progress Notes (Signed)
 PROGRESS NOTE    Manuel Chapman  FMW:[id number] DOB: 02-Jul-1945 DOA: 01/19/2024 PCP: Henry Ingle, MD    Brief Narrative:   Manuel Chapman is a 78 y.o. male with medical history significant for HFrEF (EF 45 to 50%), moderate aortic stenosis(05/2023), cerebellar stroke (2023), subarachnoid hemorrhage (2021), HTN, IDDM,  being admitted with possible GI bleed and anemia of 7.8.  He presented from his facility after his family noted that he had dark stool.  Triage note also documents intermittent rectal bleeding for a week, for which his blood thinners' (suspect clopidogrel ) were stopped.  Hemoglobin checked at the facility was 7.7 (9-11 in 2024).  No documented history of colonoscopy or endoscopy.  Noted his hospitalization 2023 when he was noted to have dark stool but no endoluminal evaluation was done as it cleared up spontaneously.   In the ED, hemodynamically stable.  Labs notable for hemoglobin 7.8, slightly improved from 7.7 on earlier check at the facility.  Creatinine 1.69, up from 0.91 a year ago. No imaging done. 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 Hgb has down-trended since then, now in 7s (was 10 last year). Report of dark stool; here stool has been brown. Suspect worsening of chronic disease anemia, but we are evaluating for possible occult GI bleed.  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 Followed by cardiology - avoid nodal blockers  # HTN Bp elevated, home meds not ordered - resume home entresto , jardiance , lasix , hydral  # T2DM Hypoglycemic overnight, A1c 5% - hold home lantus  - monitor  # Hx CVA - home plavix  on hold - cont home statin      DVT prophylaxis: SCDs Code Status: full Family Communication: son-in-law updated telephonically 9/26  Level of care: Telemetry Medical Status is: Observation    Consultants:  GI  Procedures: EGD  Antimicrobials:  none    Subjective: Reports feeling fine, no pain  Objective: Vitals:   01/20/24 1037 01/20/24 1047 01/20/24 1057 01/20/24 1245  BP: (!) 188/74 (!) 195/73 (!) 188/76 (!) 164/77  Pulse: (!) 58 (!) 58 60 67  Resp: 15 12 14 18   Temp:    97.8 F (36.6 C)  TempSrc:      SpO2: 100% 100% 100% 100%  Weight:      Height:        Intake/Output Summary (Last 24 hours) at 01/20/2024 1257 Last data filed at 01/20/2024 1018 Gross per 24 hour  Intake 750.49 ml  Output --  Net 750.49 ml   Filed Weights   01/19/24 1755 01/20/24 0150  Weight: 96.2 kg 96.2 kg    Examination:  General exam: Appears calm and comfortable  Respiratory system: Clear to auscultation. Respiratory effort normal. Cardiovascular system: S1 & S2 heard, RRR.   Gastrointestinal system: Abdomen is nondistended, soft and nontender  Central nervous system: Alert and oriented to self, location. Moving all 4 Skin: No rashes, lesions or ulcers Psychiatry: calm, mild confusion    Data Reviewed: I have personally reviewed following labs and imaging  studies  CBC: Recent Labs  Lab 01/19/24 1756 01/20/24 0204  WBC 6.7  --   HGB 7.8* 7.2*  HCT 23.9*  --   MCV 92.6  --   PLT 262  --    Basic Metabolic Panel: Recent Labs  Lab 01/19/24 1756  NA 140  K 4.3  CL 109  CO2 22  GLUCOSE 133*  BUN 24*  CREATININE 1.69*  CALCIUM  7.6*   GFR: Estimated Creatinine Clearance: 37.9 mL/min (A) (by C-G formula based on SCr of 1.69 mg/dL (H)). Liver Function Tests: No results for input(s): AST, ALT, ALKPHOS, BILITOT, PROT, ALBUMIN in the last 168 hours. No results for input(s): LIPASE, AMYLASE in the last 168 hours. No results for input(s): AMMONIA in the last 168 hours. Coagulation Profile: No results for input(s): INR, PROTIME in the last 168 hours. Cardiac Enzymes: No results for input(s): CKTOTAL, CKMB, CKMBINDEX, TROPONINI in the last 168 hours. BNP (last 3 results) No results for input(s): PROBNP in the last 8760 hours. HbA1C: Recent Labs    01/19/24 2100  HGBA1C 5.0   CBG: Recent Labs  Lab 01/20/24 0429 01/20/24 0811 01/20/24 0906 01/20/24 0934 01/20/24 1248  GLUCAP 127* 44* 117* 106* 85   Lipid Profile: No results for input(s): CHOL, HDL, LDLCALC, TRIG, CHOLHDL, LDLDIRECT in the last 72 hours. Thyroid Function Tests: No results for input(s): TSH, T4TOTAL, FREET4, T3FREE, THYROIDAB in the last 72 hours. Anemia Panel: Recent Labs    01/19/24 2100  VITAMINB12 537  FOLATE 8.0  FERRITIN 99  TIBC 210*  IRON 50  RETICCTPCT 3.6*   Urine analysis:    Component Value Date/Time   COLORURINE YELLOW 09/15/2021 2200   APPEARANCEUR CLEAR 09/15/2021 2200   LABSPEC 1.011 09/15/2021 2200   PHURINE 5.0 09/15/2021 2200   GLUCOSEU NEGATIVE 09/15/2021 2200   HGBUR NEGATIVE 09/15/2021 2200   BILIRUBINUR NEGATIVE 09/15/2021 2200   KETONESUR NEGATIVE 09/15/2021 2200   PROTEINUR 100 (A) 09/15/2021 2200   UROBILINOGEN 0.2 05/03/2011 1754   NITRITE NEGATIVE 09/15/2021  2200   LEUKOCYTESUR NEGATIVE 09/15/2021 2200   Sepsis Labs: @LABRCNTIP (procalcitonin:4,lacticidven:4)  )No results found for this or any previous visit (from the past 240 hours).       Radiology Studies: DG Chest Port 1 View Result Date: 01/20/2024 EXAM: 1 VIEW XRAY OF THE CHEST 01/20/2024 12:19:24 AM COMPARISON: 09/15/2021 CLINICAL HISTORY: Shortness of breath. SOB. FINDINGS: LUNGS AND PLEURA: No focal pulmonary opacity. No pulmonary edema. No pleural effusion. No pneumothorax. HEART AND MEDIASTINUM: Stable mild cardiomegaly. Thoracic aortic atherosclerosis. BONES AND SOFT TISSUES: No acute osseous abnormality. IMPRESSION: 1. No acute abnormalities. Electronically signed by: Pinkie Pebbles MD 01/20/2024 12:20 AM EDT RP Workstation: [id number]        Scheduled Meds:  atorvastatin   80 mg Oral Daily   donepezil   5 mg Oral Daily   FLUoxetine   10 mg Oral Daily   isosorbide  dinitrate  5 mg Oral TID   Na Sulfate-K Sulfate-Mg Sulfate concentrate  1 kit Oral Once   Continuous Infusions:  sodium chloride   dextrose  5 % and 0.45 % NaCl 100 mL/hr at 01/20/24 0845     LOS: 0 days     Devaughn KATHEE Ban, MD [facility]   If 7PM-7AM, please contact night-coverage [email or web address] Password [name] 01/20/2024, 12:57 PM

## 2024-01-20 NOTE — Plan of Care (Signed)

## 2024-01-20 NOTE — Transfer of Care (Signed)
 Immediate Anesthesia Transfer of Care Note  Patient: Manuel Chapman  Procedure(s) Performed: EGD (ESOPHAGOGASTRODUODENOSCOPY)  Patient Location: Endoscopy Unit  Anesthesia Type:General  Level of Consciousness: responds to stimulation  Airway & Oxygen Therapy: Patient Spontanous Breathing  Post-op Assessment: Report given to RN and Post -op Vital signs reviewed and stable  Post vital signs: Reviewed and stable  Last Vitals:  Vitals Value Taken Time  BP 164/62 01/20/24 10:17  Temp    Pulse 60 01/20/24 10:18  Resp 15 01/20/24 10:18  SpO2 100 % 01/20/24 10:18  Vitals shown include unfiled device data.  Last Pain:  Vitals:   01/20/24 1017  TempSrc:   PainSc: Asleep      Patients Stated Pain Goal: 0 (01/19/24 2342)  Complications: No notable events documented.

## 2024-01-20 NOTE — H&P (View-Only) (Signed)
 Manuel Copping, Manuel Chapman Manuel Chapman  [address], [address] [place], [place] [address] Phone: [phone] Fax : [phone]  Consultation  Referring Provider:     Dr. Cleatus Primary Care Physician:  Manuel Ingle, Manuel Chapman Primary Gastroenterologist: Manuel Chapman         Reason for Consultation:     Rectal bleeding  Date of Admission:  01/19/2024 Date of Consultation:  01/20/2024         HPI:   Manuel Chapman is a 78 y.o. male who has a past medical history of a stroke, subarachnoid hemorrhage, diabetes, hypertension and moderate aortic stenosis.  The patient was admitted with a hemoglobin of 7.8.  He was noted to have dark stools at the facility he lives at.  The patient had been on blood thinners that were stopped.  It does not appear that the patient has ever been seen by GI in the past.  It appears the patient has had anemia for some time and his labs have shown:  Component     Latest Ref Rng 06/22/2021 09/15/2021 05/03/2022 01/19/2024 01/20/2024  Hemoglobin     13.0 - 17.0 g/dL 89.8 (L)  9.4 (L)  89.9 (L)  7.8 (L)  7.2 (L)    The patient was reported to have iron deficiency anemia although his most recent iron studies did not show iron deficiency although he did have iron studies 1 year ago that did show iron deficiency.  The patient denies any unexplained weight loss or abdominal pain.  He also denies any history of GI bleeding or having a GI workup in the past.  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 Authorizing Provider  atorvastatin  (LIPITOR ) 80 MG tablet Take 1 tablet (80 mg total) by mouth daily. 06/22/21  Yes Angiulli, Manuel PARAS, Manuel Chapman  BIOFREEZE COOL THE PAIN 4 % GEL Apply topically. 05/24/23  Yes Provider, Historical, Manuel Chapman  Cholecalciferol (VITAMIN D3) 125 MCG (5000 UT) CAPS Take 1 capsule by mouth daily. 04/21/23  Yes Provider, Historical, Manuel Chapman  clopidogrel  (PLAVIX ) 75 MG tablet Take 1 tablet (75 mg total) by mouth daily. 05/10/21  Yes Manuel Chapman, Manuel RAMAN, Manuel Chapman  donepezil  (ARICEPT ) 5 MG tablet Take 5 mg by mouth daily. 06/16/23  Yes Provider, Historical, Manuel Chapman  empagliflozin  (JARDIANCE ) 10 MG TABS tablet Take 1 tablet (10 mg total) by mouth daily before breakfast. 05/07/22  Yes [place], Manuel HERO, Manuel Chapman  ENTRESTO  49-51 MG Take 1 tablet by mouth 2 (two) times daily. 05/07/22  Yes Provider, Historical, Manuel Chapman  ferrous sulfate  324 (65 Fe) MG TBEC Take 1 tablet by mouth daily. 04/21/23  Yes Provider, Historical, Manuel Chapman  FLUoxetine  (PROZAC ) 10 MG capsule Take 10 mg by mouth daily. 06/16/23  Yes Provider, Historical, Manuel Chapman  furosemide  (LASIX ) 40 MG tablet Take 40 mg by mouth daily. 06/16/23  Yes Provider, Historical, Manuel Chapman  gentamicin ointment (GARAMYCIN) 0.1 % Apply  1 Application topically 3 (three) times daily. 12/26/23  Yes Provider, Historical, Manuel Chapman  hydrALAZINE  (APRESOLINE ) 100 MG tablet Take 100 mg by mouth 3 (three) times daily.   Yes Provider, Historical, Manuel Chapman  Insulin  Aspart FlexPen (NOVOLOG ) 100 UNIT/ML Inject 2-12 Units into the skin 2 (two) times daily. Before meals and at bedtime 06/12/23  Yes Provider, Historical, Manuel Chapman  isosorbide  dinitrate (ISORDIL ) 5 MG tablet Take 5 mg by mouth 3 (three) times daily. 06/16/23  Yes Provider, Historical, Manuel Chapman  LANTUS  SOLOSTAR 100 UNIT/ML Solostar Pen Inject 26 Units into the skin daily. 06/20/23  Yes Provider, Historical, Manuel Chapman   medroxyPROGESTERone (PROVERA) 10 MG tablet Take 10 mg by mouth 2 (two) times daily.   Yes Provider, Historical, Manuel Chapman  Multiple Vitamin (MULTIVITAMIN WITH MINERALS) TABS tablet Take 1 tablet by mouth daily. 05/10/21  Yes Manuel Chapman, Manuel RAMAN, Manuel Chapman  potassium chloride  SA (KLOR-CON  Chapman) 20 MEQ tablet Take 1 tablet (20 mEq total) by mouth daily. 09/25/21  Yes Manuel Catalan Chapman, Manuel Chapman  promethazine (PHENERGAN) 25 MG/ML injection Inject 25 mg into the muscle every 8 (eight) hours as needed for nausea or vomiting. 12/27/23  Yes Provider, Historical, Manuel Chapman  acetaminophen  (TYLENOL ) 325 MG tablet Take 2 tablets (650 mg total) by mouth every 4 (four) hours as needed for mild pain (or temp > 37.5 C (99.5 F)). Patient not taking: Reported on 01/20/2024 05/09/21   Manuel Chapman, Manuel RAMAN, Manuel Chapman  escitalopram (LEXAPRO) 10 MG tablet Take 10 mg by mouth daily. Patient not taking: Reported on 01/20/2024 12/06/22   Provider, Historical, Manuel Chapman  feeding supplement, GLUCERNA SHAKE, (GLUCERNA SHAKE) LIQD Take 237 mLs by mouth 2 (two) times daily between meals.    Provider, Historical, Manuel Chapman  insulin  glargine-yfgn (SEMGLEE ) 100 UNIT/ML Pen  05/31/23   Provider, Historical, Manuel Chapman  isosorbide  mononitrate (ISMO ) 10 MG tablet Take by mouth. Patient not taking: Reported on 06/23/2023 04/07/23   Provider, Historical, Manuel Chapman  ondansetron  (ZOFRAN ) 8 MG tablet Take by mouth. Patient not taking: Reported on 01/20/2024 06/12/23   Provider, Historical, Manuel Chapman    Family History  Problem Relation Age of Onset   Diabetes Neg Hx    Stroke Neg Hx      Social History   Tobacco Use   Smoking status: Former    Current packs/day: 0.00    Average packs/day: 1 pack/day for 10.0 years (10.0 ttl pk-yrs)    Types: Cigarettes    Start date: 39    Quit date: 1998    Years since quitting: 27.7   Smokeless tobacco: Never  Vaping Use   Vaping status: Never Used  Substance Use Topics   Alcohol use: Not Currently    Comment: not much, h/o heavy beer use   Drug use: No     Allergies as of 01/19/2024   (No Known Allergies)    Review of Systems:    All systems reviewed and negative except where noted in HPI.   Physical Exam:  Vital signs in last 24 hours: Temp:  [97.8 F (36.6 C)-98.9 F (37.2 C)] 97.8 F (36.6 C) (09/26 1245) Pulse Rate:  [58-72] 67 (09/26 1245) Resp:  [12-20] 18 (09/26 1245) BP: (122-195)/(58-77) 164/77 (09/26 1245) SpO2:  [100 %] 100 % (09/26 1245) Weight:  [96.2 kg] 96.2 kg (09/26 0150)   General:   Pleasant, cooperative in NAD Head:  Normocephalic and atraumatic. Eyes:   No icterus.   Conjunctiva pink. PERRLA. Ears:  Normal auditory acuity. Neck:  Supple; no masses or thyroidomegaly Lungs: Respirations even  and unlabored. Lungs clear to auscultation bilaterally.   No wheezes, crackles, or rhonchi.  Heart:  Regular rate and rhythm;  Without murmur, clicks, rubs or gallops Abdomen:  Soft, nondistended, nontender. Normal bowel sounds. No appreciable masses or hepatomegaly.  No rebound or guarding.  Rectal:  Not performed. Msk:  Symmetrical without gross deformities.    Extremities:  Without edema, cyanosis or clubbing. Neurologic:  Alert and oriented x3;  grossly normal neurologically. Skin:  Intact without significant lesions or rashes. Cervical Nodes:  No significant cervical adenopathy. Psych:  Alert and cooperative. Normal affect.  LAB RESULTS: Recent Labs    01/19/24 1756 01/20/24 0204  WBC 6.7  --   HGB 7.8* 7.2*  HCT 23.9*  --   PLT 262  --    BMET Recent Labs    01/19/24 1756 01/20/24 1437  NA 140 142  K 4.3 4.4  CL 109 113*  CO2 22 23  GLUCOSE 133* 84  BUN 24* 20  CREATININE 1.69* 1.25*  CALCIUM  7.6* 7.6*   LFT No results for input(s): PROT, ALBUMIN, AST, ALT, ALKPHOS, BILITOT, BILIDIR, IBILI in the last 72 hours. PT/INR No results for input(s): LABPROT, INR in the last 72 hours.  STUDIES: DG Chest Port 1 View Result Date: 01/20/2024 EXAM: 1 VIEW XRAY OF THE CHEST  01/20/2024 12:19:24 AM COMPARISON: 09/15/2021 CLINICAL HISTORY: Shortness of breath. SOB. FINDINGS: LUNGS AND PLEURA: No focal pulmonary opacity. No pulmonary edema. No pleural effusion. No pneumothorax. HEART AND MEDIASTINUM: Stable mild cardiomegaly. Thoracic aortic atherosclerosis. BONES AND SOFT TISSUES: No acute osseous abnormality. IMPRESSION: 1. No acute abnormalities. 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 y/o male with anemia with iron studies last year that showed iron deficiency anemia although the patient's iron studies on this admission did not show iron deficiency.  The patient's MCV was also normal but the patient was reported to have had dark stools.  The patient had a reticulocyte count that showed normal immature reticulocyte fraction.  Plan:  The patient will be set up for an EGD for today.  Although this patient does not appear to have low iron studies at this time with a normal ferritin he was reported to have dark stools.  This in conjunction with him not having an EGD or colonoscopy in the past makes me think that proceeding with a GI workup at this time would be prudent since this may be a mixed picture of possible causes of his anemia since he did have iron deficiency a year ago.  The patient has been explained the plan and agrees with it.  Thank you for involving me in the care  of this patient.      LOS: 0 days   Manuel Copping, MD, Manuel Chapman. NOLIA 01/20/2024, 5:10 PM,  Pager [phone] 7am-5pm  Check AMION for 5pm -7am coverage and on weekends   Note: This dictation was prepared with Dragon dictation along with  smaller phrase technology. Any transcriptional errors that result from this process are unintentional.

## 2024-01-20 NOTE — Progress Notes (Signed)
 Hypoglycemic Event  CBG: 41 at 0350 check by RN  Treatment: D50 50 mL (25 gm)  Symptoms: None  Follow-up CBG: Time:0425 CBG Result:127  Possible Reasons for Event: Inadequate meal intake patient is NPO  Comments/MD notified:Dr. Cleatus notified via secure chat; RN followed hypoglycemic protocol  Shona LITTIE Pouch RN

## 2024-01-20 NOTE — Consult Note (Signed)
 Manuel Copping, MD Manuel Chapman  [address], [address] [place], [place] [address] Phone: [phone] Fax : [phone]  Consultation  Referring Provider:     Dr. Cleatus Primary Care Physician:  Manuel Ingle, MD Primary Gastroenterologist: Manuel Chapman         Reason for Consultation:     Rectal bleeding  Date of Admission:  01/19/2024 Date of Consultation:  01/20/2024         HPI:   Manuel Chapman is a 78 y.o. male who has a past medical history of a stroke, subarachnoid hemorrhage, diabetes, hypertension and moderate aortic stenosis.  The patient was admitted with a hemoglobin of 7.8.  He was noted to have dark stools at the facility he lives at.  The patient had been on blood thinners that were stopped.  It does not appear that the patient has ever been seen by GI in the past.  It appears the patient has had anemia for some time and his labs have shown:  Component     Latest Ref Rng 06/22/2021 09/15/2021 05/03/2022 01/19/2024 01/20/2024  Hemoglobin     13.0 - 17.0 g/dL 89.8 (L)  9.4 (L)  89.9 (L)  7.8 (L)  7.2 (L)    The patient was reported to have iron deficiency anemia although his most recent iron studies did not show iron deficiency although he did have iron studies 1 year ago that did show iron deficiency.  The patient denies any unexplained weight loss or abdominal pain.  He also denies any history of GI bleeding or having a GI workup in the past.  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 Authorizing Provider  atorvastatin  (LIPITOR ) 80 MG tablet Take 1 tablet (80 mg total) by mouth daily. 06/22/21  Yes Angiulli, Toribio PARAS, PA-C  BIOFREEZE COOL THE PAIN 4 % GEL Apply topically. 05/24/23  Yes [provider]  Cholecalciferol (VITAMIN D3) 125 MCG (5000 UT) CAPS Take 1 capsule by mouth daily. 04/21/23  Yes [provider]  clopidogrel  (PLAVIX ) 75 MG tablet Take 1 tablet (75 mg total) by mouth daily. 05/10/21  Yes Elgergawy, Brayton RAMAN, MD  donepezil  (ARICEPT ) 5 MG tablet Take 5 mg by mouth daily. 06/16/23  Yes [provider]  empagliflozin  (JARDIANCE ) 10 MG TABS tablet Take 1 tablet (10 mg total) by mouth daily before breakfast. 05/07/22  Yes Milford, Harlene HERO, FNP  ENTRESTO  49-51 MG Take 1 tablet by mouth 2 (two) times daily. 05/07/22  Yes [provider]  ferrous sulfate  324 (65 Fe) MG TBEC Take 1 tablet by mouth daily. 04/21/23  Yes [provider]  FLUoxetine  (PROZAC ) 10 MG capsule Take 10 mg by mouth daily. 06/16/23  Yes [provider]  furosemide  (LASIX ) 40 MG tablet Take 40 mg by mouth daily. 06/16/23  Yes [provider]  gentamicin ointment (GARAMYCIN) 0.1 % Apply  1 Application topically 3 (three) times daily. 12/26/23  Yes [provider]  hydrALAZINE  (APRESOLINE ) 100 MG tablet Take 100 mg by mouth 3 (three) times daily.   Yes [provider]  Insulin  Aspart FlexPen (NOVOLOG ) 100 UNIT/ML Inject 2-12 Units into the skin 2 (two) times daily. Before meals and at bedtime 06/12/23  Yes [provider]  isosorbide  dinitrate (ISORDIL ) 5 MG tablet Take 5 mg by mouth 3 (three) times daily. 06/16/23  Yes [provider]  LANTUS  SOLOSTAR 100 UNIT/ML Solostar Pen Inject 26 Units into the skin daily. 06/20/23  Yes [provider]   medroxyPROGESTERone (PROVERA) 10 MG tablet Take 10 mg by mouth 2 (two) times daily.   Yes [provider]  Multiple Vitamin (MULTIVITAMIN WITH MINERALS) TABS tablet Take 1 tablet by mouth daily. 05/10/21  Yes Elgergawy, Brayton RAMAN, MD  potassium chloride  SA (KLOR-CON  M) 20 MEQ tablet Take 1 tablet (20 mEq total) by mouth daily. 09/25/21  Yes Marcine Catalan M, PA-C  promethazine (PHENERGAN) 25 MG/ML injection Inject 25 mg into the muscle every 8 (eight) hours as needed for nausea or vomiting. 12/27/23  Yes [provider]  acetaminophen  (TYLENOL ) 325 MG tablet Take 2 tablets (650 mg total) by mouth every 4 (four) hours as needed for mild pain (or temp > 37.5 C (99.5 F)). Patient not taking: Reported on 01/20/2024 05/09/21   Elgergawy, Brayton RAMAN, MD  escitalopram (LEXAPRO) 10 MG tablet Take 10 mg by mouth daily. Patient not taking: Reported on 01/20/2024 12/06/22   [provider]  feeding supplement, GLUCERNA SHAKE, (GLUCERNA SHAKE) LIQD Take 237 mLs by mouth 2 (two) times daily between meals.    [provider]  insulin  glargine-yfgn (SEMGLEE ) 100 UNIT/ML Pen  05/31/23   [provider]  isosorbide  mononitrate (ISMO ) 10 MG tablet Take by mouth. Patient not taking: Reported on 06/23/2023 04/07/23   [provider]  ondansetron  (ZOFRAN ) 8 MG tablet Take by mouth. Patient not taking: Reported on 01/20/2024 06/12/23   [provider]    Family History  Problem Relation Age of Onset   Diabetes Neg Hx    Stroke Neg Hx      Social History   Tobacco Use   Smoking status: Former    Current packs/day: 0.00    Average packs/day: 1 pack/day for 10.0 years (10.0 ttl pk-yrs)    Types: Cigarettes    Start date: 39    Quit date: 1998    Years since quitting: 27.7   Smokeless tobacco: Never  Vaping Use   Vaping status: Never Used  Substance Use Topics   Alcohol use: Not Currently    Comment: not much, h/o heavy beer use   Drug use: No     Allergies as of 01/19/2024   (No Known Allergies)    Review of Systems:    All systems reviewed and negative except where noted in HPI.   Physical Exam:  Vital signs in last 24 hours: Temp:  [97.8 F (36.6 C)-98.9 F (37.2 C)] 97.8 F (36.6 C) (09/26 1245) Pulse Rate:  [58-72] 67 (09/26 1245) Resp:  [12-20] 18 (09/26 1245) BP: (122-195)/(58-77) 164/77 (09/26 1245) SpO2:  [100 %] 100 % (09/26 1245) Weight:  [96.2 kg] 96.2 kg (09/26 0150)   General:   Pleasant, cooperative in NAD Head:  Normocephalic and atraumatic. Eyes:   No icterus.   Conjunctiva pink. PERRLA. Ears:  Normal auditory acuity. Neck:  Supple; no masses or thyroidomegaly Lungs: Respirations even  and unlabored. Lungs clear to auscultation bilaterally.   No wheezes, crackles, or rhonchi.  Heart:  Regular rate and rhythm;  Without murmur, clicks, rubs or gallops Abdomen:  Soft, nondistended, nontender. Normal bowel sounds. No appreciable masses or hepatomegaly.  No rebound or guarding.  Rectal:  Not performed. Msk:  Symmetrical without gross deformities.    Extremities:  Without edema, cyanosis or clubbing. Neurologic:  Alert and oriented x3;  grossly normal neurologically. Skin:  Intact without significant lesions or rashes. Cervical Nodes:  No significant cervical adenopathy. Psych:  Alert and cooperative. Normal affect.  LAB RESULTS: Recent Labs    01/19/24 1756 01/20/24 0204  WBC 6.7  --   HGB 7.8* 7.2*  HCT 23.9*  --   PLT 262  --    BMET Recent Labs    01/19/24 1756 01/20/24 1437  NA 140 142  K 4.3 4.4  CL 109 113*  CO2 22 23  GLUCOSE 133* 84  BUN 24* 20  CREATININE 1.69* 1.25*  CALCIUM  7.6* 7.6*   LFT No results for input(s): PROT, ALBUMIN, AST, ALT, ALKPHOS, BILITOT, BILIDIR, IBILI in the last 72 hours. PT/INR No results for input(s): LABPROT, INR in the last 72 hours.  STUDIES: DG Chest Port 1 View Result Date: 01/20/2024 EXAM: 1 VIEW XRAY OF THE CHEST  01/20/2024 12:19:24 AM COMPARISON: 09/15/2021 CLINICAL HISTORY: Shortness of breath. SOB. FINDINGS: LUNGS AND PLEURA: No focal pulmonary opacity. No pulmonary edema. No pleural effusion. No pneumothorax. HEART AND MEDIASTINUM: Stable mild cardiomegaly. Thoracic aortic atherosclerosis. BONES AND SOFT TISSUES: No acute osseous abnormality. IMPRESSION: 1. No acute abnormalities. 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 y/o male with anemia with iron studies last year that showed iron deficiency anemia although the patient's iron studies on this admission did not show iron deficiency.  The patient's MCV was also normal but the patient was reported to have had dark stools.  The patient had a reticulocyte count that showed normal immature reticulocyte fraction.  Plan:  The patient will be set up for an EGD for today.  Although this patient does not appear to have low iron studies at this time with a normal ferritin he was reported to have dark stools.  This in conjunction with him not having an EGD or colonoscopy in the past makes me think that proceeding with a GI workup at this time would be prudent since this may be a mixed picture of possible causes of his anemia since he did have iron deficiency a year ago.  The patient has been explained the plan and agrees with it.  Thank you for involving me in the care  of this patient.      LOS: 0 days   Manuel Copping, MD, MD. Manuel Chapman 01/20/2024, 5:10 PM,  Pager [phone] 7am-5pm  Check AMION for 5pm -7am coverage and on weekends   Note: This dictation was prepared with Dragon dictation along with  smaller phrase technology. Any transcriptional errors that result from this process are unintentional.

## 2024-01-20 NOTE — Anesthesia Postprocedure Evaluation (Signed)
 Anesthesia Post Note  Patient: Manuel Chapman  Procedure(s) Performed: EGD (ESOPHAGOGASTRODUODENOSCOPY)  Patient location during evaluation: PACU Anesthesia Type: General Level of consciousness: sedated Vital Signs Assessment: post-procedure vital signs reviewed and stable Respiratory status: spontaneous breathing Cardiovascular status: stable Anesthetic complications: no   No notable events documented.   Last Vitals:  Vitals:   01/20/24 1057 01/20/24 1245  BP: (!) 188/76 (!) 164/77  Pulse: 60 67  Resp: 14 18  Temp:  36.6 C  SpO2: 100% 100%    Last Pain:  Vitals:   01/20/24 1047  TempSrc:   PainSc: 0-No pain                 VAN STAVEREN,Saafir Abdullah

## 2024-01-20 NOTE — Anesthesia Preprocedure Evaluation (Signed)
 Anesthesia Evaluation  Patient identified by MRN, date of birth, ID band Patient awake    Reviewed: Allergy & Precautions, NPO status , Patient's Chart, lab work & pertinent test results  Airway Mallampati: III  TM Distance: >3 FB Neck ROM: full    Dental  (+) Upper Dentures, Lower Dentures   Pulmonary neg pulmonary ROS, COPD, former smoker   Pulmonary exam normal  + decreased breath sounds      Cardiovascular Exercise Tolerance: Poor hypertension, Pt. on medications negative cardio ROS Normal cardiovascular exam+ dysrhythmias  Rhythm:Regular Rate:Normal     Neuro/Psych CVA, Residual Symptoms negative neurological ROS  negative psych ROS   GI/Hepatic negative GI ROS, Neg liver ROS,,,  Endo/Other  negative endocrine ROSdiabetes, Poorly Controlled, Type 1, Insulin  Dependent  Class 3 obesity  Renal/GU negative Renal ROS  negative genitourinary   Musculoskeletal negative musculoskeletal ROS (+)    Abdominal  (+) + obese  Peds  Hematology negative hematology ROS (+) Blood dyscrasia, anemia   Anesthesia Other Findings Past Medical History: 09/16/2021: Aortic stenosis 09/16/2021: Cognitive deficit as late effect of multiple subcortical  cerebrovascular accidents (CVAs) 2020: CVA (cerebral vascular accident) (HCC) No date: Diabetes mellitus No date: Hypertension 11/2019: ICH (intracerebral hemorrhage) (HCC) 09/16/2021: Iron deficiency anemia 09/16/2021: Metabolic acidosis  History reviewed. No pertinent surgical history.  BMI    Body Mass Index: 34.23 kg/m      Reproductive/Obstetrics negative OB ROS                              Anesthesia Physical Anesthesia Plan  ASA: 3  Anesthesia Plan: General   Post-op Pain Management:    Induction: Intravenous  PONV Risk Score and Plan: Propofol  infusion and TIVA  Airway Management Planned: Natural Airway and Nasal Cannula  Additional  Equipment:   Intra-op Plan:   Post-operative Plan:   Informed Consent: I have reviewed the patients History and Physical, chart, labs and discussed the procedure including the risks, benefits and alternatives for the proposed anesthesia with the patient or authorized representative who has indicated his/her understanding and acceptance.     Dental Advisory Given  Plan Discussed with: CRNA  Anesthesia Plan Comments:         Anesthesia Quick Evaluation

## 2024-01-20 NOTE — Op Note (Signed)
 [facility] Gastroenterology Patient Name: Manuel Chapman Procedure Date: 01/20/2024 9:52 AM MRN: [id number] Account #: [id number] Date of Birth: 1945-05-25 Admit Type: Inpatient Age: 78 Room: [facility] ENDO ROOM 4 Gender: Male Note Status: Finalized Instrument Name: Upper GI Scope [id number] Procedure:             Upper GI endoscopy Indications:           Melena Providers:             Rogelia Copping MD, MD Referring MD:          Tanda Rummer (Referring MD) Medicines:             Propofol  per Anesthesia Complications:         No immediate complications. Procedure:             Pre-Anesthesia Assessment:                        - Prior to the procedure, a History and Physical was                         performed, and patient medications and allergies were                         reviewed. The patient's tolerance of previous                         anesthesia was also reviewed. The risks and benefits                         of the procedure and the sedation options and risks                         were discussed with the patient. All questions were                         answered, and informed consent was obtained. Prior                         Anticoagulants: The patient has taken no anticoagulant                         or antiplatelet agents. ASA Grade Assessment: II - A                         patient with mild systemic disease. After reviewing                         the risks and benefits, the patient was deemed in                         satisfactory condition to undergo the procedure.                        After obtaining informed consent, the endoscope was                         passed under direct vision. Throughout the procedure,  the patient's blood pressure, pulse, and oxygen                         saturations were monitored continuously. The Endoscope                         was introduced through the mouth, and advanced to the                          second part of duodenum. The upper GI endoscopy was                         accomplished without difficulty. The patient tolerated                         the procedure well. Findings:      The Z-line was irregular.      The entire examined stomach was normal.      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      A single medium-sized semi-pedunculated polyp with no bleeding was found       in the second portion of the duodenum. Biopsies were taken with a cold       forceps for histology. Impression:            - Z-line irregular.                        - Normal stomach.                        - Mucosal variant in the duodenum.                        - A single duodenal polyp. Biopsied. Recommendation:        - Return patient to hospital ward for ongoing care.                        - Clear liquid diet.                        - Continue present medications.                        - Perform a colonoscopy today. Procedure Code(s):     --- Professional ---                        [phone], Esophagogastroduodenoscopy, flexible,                         transoral; with biopsy, single or multiple Diagnosis Code(s):     --- Professional ---                        K92.1, Melena (includes Hematochezia)                        K31.7, Polyp of stomach and duodenum CPT copyright 2022 American Medical Association. All rights reserved. The codes documented in this report are preliminary and upon coder review may  be revised to meet current compliance requirements. Rogelia Copping MD, MD 01/20/2024 10:14:43 AM This report has been signed electronically. Number of Addenda: 0 Note Initiated On: 01/20/2024 9:52 AM Estimated Blood Loss:  Estimated blood loss: none.      [facility]

## 2024-01-20 NOTE — OR Nursing (Signed)
 Pt scheduled for colonoscopy 01/21/24, spoke with April Mitchell, LPN,  to notify Steffan Louder [facility]) of pt for informed consent; she voiced understanding

## 2024-01-21 ENCOUNTER — Observation Stay: Payer: Self-pay | Admitting: Anesthesiology

## 2024-01-21 ENCOUNTER — Encounter: Payer: Self-pay | Admitting: Internal Medicine

## 2024-01-21 ENCOUNTER — Encounter
Admission: EM | Disposition: A | Discharge status: Skilled Nursing Facility | Payer: Self-pay | Source: Home / Self Care | Attending: Emergency Medicine

## 2024-01-21 DIAGNOSIS — K921 Melena: Secondary | ICD-10-CM | POA: Diagnosis not present

## 2024-01-21 DIAGNOSIS — K625 Hemorrhage of anus and rectum: Secondary | ICD-10-CM | POA: Diagnosis not present

## 2024-01-21 HISTORY — PX: COLONOSCOPY: SHX5424

## 2024-01-21 HISTORY — PX: POLYPECTOMY: SHX149

## 2024-01-21 LAB — BASIC METABOLIC PANEL WITH GFR
Anion gap: 7 (ref 5–15)
BUN: 15 mg/dL (ref 8–23)
CO2: 20 mmol/L — ABNORMAL LOW (ref 22–32)
Calcium: 7.5 mg/dL — ABNORMAL LOW (ref 8.9–10.3)
Chloride: 113 mmol/L — ABNORMAL HIGH (ref 98–111)
Creatinine, Ser: 1.32 mg/dL — ABNORMAL HIGH (ref 0.61–1.24)
GFR, Estimated: 55 mL/min — ABNORMAL LOW (ref >60)
Glucose, Bld: 165 mg/dL — ABNORMAL HIGH (ref 70–99)
Potassium: 4.1 mmol/L (ref 3.5–5.1)
Sodium: 140 mmol/L (ref 135–145)

## 2024-01-21 LAB — GLUCOSE, CAPILLARY
Glucose-Capillary: 102 mg/dL — ABNORMAL HIGH (ref 70–99)
Glucose-Capillary: 103 mg/dL — ABNORMAL HIGH (ref 70–99)
Glucose-Capillary: 146 mg/dL — ABNORMAL HIGH (ref 70–99)
Glucose-Capillary: 95 mg/dL (ref 70–99)

## 2024-01-21 LAB — CBC
HCT: 22.7 % — ABNORMAL LOW (ref 39.0–52.0)
Hemoglobin: 7.3 g/dL — ABNORMAL LOW (ref 13.0–17.0)
MCH: 29.7 pg (ref 26.0–34.0)
MCHC: 32.2 g/dL (ref 30.0–36.0)
MCV: 92.3 fL (ref 80.0–100.0)
Platelets: 231 K/uL (ref 150–400)
RBC: 2.46 MIL/uL — ABNORMAL LOW (ref 4.22–5.81)
RDW: 14.4 % (ref 11.5–15.5)
WBC: 6 K/uL (ref 4.0–10.5)
nRBC: 0 % (ref 0.0–0.2)

## 2024-01-21 SURGERY — COLONOSCOPY
Anesthesia: General

## 2024-01-21 MED ORDER — PROPOFOL 10 MG/ML IV BOLUS
INTRAVENOUS | Status: AC
  Filled 2024-01-21: qty 40

## 2024-01-21 MED ORDER — SODIUM CHLORIDE 0.9 % IV SOLN: INTRAVENOUS | Status: DC

## 2024-01-21 MED ORDER — METOPROLOL TARTRATE 5 MG/5ML IV SOLN
5.0000 mg | Freq: Once | INTRAVENOUS | Status: AC
  Administered 2024-01-21: 5 mg via INTRAVENOUS
  Filled 2024-01-21: qty 5

## 2024-01-21 MED ORDER — SODIUM CHLORIDE 0.9 % IV SOLN: INTRAVENOUS | Status: DC | PRN

## 2024-01-21 MED ORDER — PROPOFOL 500 MG/50ML IV EMUL
INTRAVENOUS | Status: DC | PRN
  Administered 2024-01-21: 150 ug/kg/min via INTRAVENOUS

## 2024-01-21 MED ORDER — PROPOFOL 10 MG/ML IV BOLUS
INTRAVENOUS | Status: DC | PRN
Start: 2024-01-21 — End: 2024-01-21
  Administered 2024-01-21: 30 mg via INTRAVENOUS

## 2024-01-21 MED ORDER — LIDOCAINE HCL (CARDIAC) PF 100 MG/5ML IV SOSY
PREFILLED_SYRINGE | INTRAVENOUS | Status: DC | PRN
  Administered 2024-01-21: 40 mg via INTRAVENOUS

## 2024-01-21 MED ORDER — HYDRALAZINE HCL 50 MG PO TABS
100.0000 mg | ORAL_TABLET | Freq: Three times a day (TID) | ORAL | Status: DC
  Administered 2024-01-21: 100 mg via ORAL
  Filled 2024-01-21 (×2): qty 2

## 2024-01-21 MED ORDER — LIDOCAINE HCL (PF) 2 % IJ SOLN
INTRAMUSCULAR | Status: AC
  Filled 2024-01-21: qty 5

## 2024-01-21 NOTE — Plan of Care (Signed)

## 2024-01-21 NOTE — Op Note (Signed)
 [facility] Gastroenterology Patient Name: Manuel Chapman Procedure Date: 01/21/2024 11:11 AM MRN: [id number] Account #: [id number] Date of Birth: [birth year] Admit Type: Outpatient Age: 78 Room: [facility] ENDO ROOM 4 Gender: Male Note Status: Finalized Instrument Name: Colon Scope [id number] Procedure:             Colonoscopy Indications:           Hematochezia Providers:             Yoon Barca K. Fareeda Downard MD, MD Medicines:             Propofol  per Anesthesia Complications:         No immediate complications. Estimated blood loss: None. Procedure:             Pre-Anesthesia Assessment:                        - The risks and benefits of the procedure and the                         sedation options and risks were discussed with the                         patient. All questions were answered and informed                         consent was obtained.                        - Patient identification and proposed procedure were                         verified prior to the procedure by the nurse. The                         procedure was verified in the procedure room.                        - ASA Grade Assessment: III - A patient with severe                         systemic disease.                        - After reviewing the risks and benefits, the patient                         was deemed in satisfactory condition to undergo the                         procedure.                        After obtaining informed consent, the colonoscope was                         passed under direct vision. Throughout the procedure,                         the patient's blood pressure, pulse, and oxygen  saturations were monitored continuously. The                         Colonoscope was introduced through the anus and                         advanced to the the cecum, identified by appendiceal                         orifice and ileocecal valve. The colonoscopy was                          performed without difficulty. The patient tolerated                         the procedure well. The quality of the bowel                         preparation was adequate. The ileocecal valve,                         appendiceal orifice, and rectum were photographed. Findings:      The perianal and digital rectal examinations were normal. Pertinent       negatives include normal sphincter tone and no palpable rectal lesions.      Non-bleeding internal hemorrhoids were found during retroflexion. The       hemorrhoids were Grade I (internal hemorrhoids that do not prolapse).      Multiple large-mouthed and small-mouthed diverticula were found in the       sigmoid colon.      A 10 mm polyp was found in the splenic flexure. The polyp was       semi-pedunculated. The polyp was removed with a hot snare. Resection and       retrieval were complete. Estimated blood loss: none.      The exam was otherwise without abnormality. Impression:            - Non-bleeding internal hemorrhoids.                        - Diverticulosis in the sigmoid colon.                        - One 10 mm polyp at the splenic flexure, removed with                         a hot snare. Resected and retrieved.                        - The examination was otherwise normal. Recommendation:        - Return patient to hospital ward for ongoing care.                        - Resume previous diet.                        - Continue present medications.                        - [facility] will sign off  for now. Call us  back if any                         changes clinically or if we can help for any reason.                        - Await pathology results.                        - The findings and recommendations were discussed with                         the patient's family. Procedure Code(s):     --- Professional ---                        [phone], Colonoscopy, flexible; with removal of                          tumor(s), polyp(s), or other lesion(s) by snare                         technique Diagnosis Code(s):     --- Professional ---                        K57.30, Diverticulosis of large intestine without                         perforation or abscess without bleeding                        K92.1, Melena (includes Hematochezia)                        D12.3, Benign neoplasm of transverse colon (hepatic                         flexure or splenic flexure)                        K64.0, First degree hemorrhoids CPT copyright 2022 American Medical Association. All rights reserved. The codes documented in this report are preliminary and upon coder review may  be revised to meet current compliance requirements. Ladell MARLA Boss MD, MD 01/21/2024 11:58:12 AM This report has been signed electronically. Number of Addenda: 0 Note Initiated On: 01/21/2024 11:11 AM Scope Withdrawal Time: 0 hours 7 minutes 47 seconds  Total Procedure Duration: 0 hours 12 minutes 8 seconds  Estimated Blood Loss:  Estimated blood loss: none.      [facility]

## 2024-01-21 NOTE — Transfer of Care (Signed)
 Immediate Anesthesia Transfer of Care Note  Patient: Manuel Chapman  Procedure(s) Performed: Procedure(s): COLONOSCOPY (N/A) POLYPECTOMY, INTESTINE  Patient Location: PACU and Endoscopy Unit  Anesthesia Type:General  Level of Consciousness: sedated  Airway & Oxygen Therapy: Patient Spontanous Breathing and Patient connected to nasal cannula oxygen  Post-op Assessment: Report given to RN and Post -op Vital signs reviewed and stable  Post vital signs: Reviewed and stable  Last Vitals:  Vitals:   01/21/24 1101 01/21/24 1155  BP: (!) 155/68 (!) 99/56  Pulse: 68 75  Resp: 19 (!) 28  Temp: (!) 36.4 C (!) 35.8 C  SpO2: 100% 99%    Complications: No apparent anesthesia complications

## 2024-01-21 NOTE — Anesthesia Preprocedure Evaluation (Addendum)
 Anesthesia Evaluation  Patient identified by MRN, date of birth, ID band Patient awake    Reviewed: Allergy & Precautions, NPO status , Patient's Chart, lab work & pertinent test results  Airway Mallampati: III  TM Distance: >3 FB Neck ROM: full    Dental  (+) Upper Dentures, Lower Dentures   Pulmonary COPD, former smoker    + decreased breath sounds      Cardiovascular Exercise Tolerance: Poor hypertension, Pt. on medications +CHF (Chronic diastolic heart failure)  Normal cardiovascular exam+ dysrhythmias + Valvular Problems/Murmurs AS  Rhythm:Regular Rate:Normal  ECHO:  1. Left ventricular ejection fraction, by estimation, is 60 to 65%. The  left ventricle has normal function. The left ventricle has no regional  wall motion abnormalities. There is moderate asymmetric left ventricular  hypertrophy of the septal segment.  Left ventricular diastolic parameters are consistent with Grade I  diastolic dysfunction (impaired relaxation).   2. Right ventricular systolic function is normal. The right ventricular  size is normal.   3. The mitral valve is normal in structure. Mild mitral valve  regurgitation. No evidence of mitral stenosis.   4. The aortic valve is calcified. There is severe calcifcation of the  aortic valve. There is severe thickening of the aortic valve. Aortic valve  regurgitation is mild. Moderate aortic valve stenosis. Aortic valve area,  by VTI measures 0.89 cm. Aortic  valve mean gradient measures 28.0 mmHg. Aortic valve Vmax measures 3.58  m/s.   5. The inferior vena cava is normal in size with greater than 50%  respiratory variability, suggesting right atrial pressure of 3 mmHg.     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) 2020: CVA (cerebral vascular accident) (HCC) No date: Diabetes mellitus No date: Hypertension 11/2019: ICH (intracerebral hemorrhage) (HCC) 09/16/2021: Iron deficiency anemia 09/16/2021: Metabolic acidosis  BMI    Body Mass Index: 34.23 kg/m      Reproductive/Obstetrics negative OB ROS                              Anesthesia Physical Anesthesia Plan  ASA: 3  Anesthesia Plan: General   Post-op Pain Management:    Induction: Intravenous  PONV Risk Score and Plan: Propofol  infusion and TIVA  Airway Management Planned: Natural Airway and Nasal Cannula  Additional Equipment:   Intra-op Plan:   Post-operative Plan:   Informed Consent: I have reviewed the patients History and Physical, chart, labs and discussed the procedure including the risks, benefits and alternatives for the proposed anesthesia with the patient or authorized representative who has indicated his/her understanding and acceptance.     Dental Advisory Given  Plan Discussed with: CRNA  Anesthesia Plan Comments:          Anesthesia Quick Evaluation

## 2024-01-21 NOTE — Discharge Summary (Signed)
 Manuel Chapman FMW:[id number] DOB: 01-Aug-1945 DOA: 01/19/2024  PCP: Henry Ingle, MD  Admit date: 01/19/2024 Discharge date: 01/21/2024  Time spent: 35 minutes  Recommendations for Outpatient Follow-up:  Pcp f/u Hematology (Dr. 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 male with medical history significant for HFrEF (EF 45 to 50%), moderate aortic stenosis(05/2023), cerebellar stroke (2023), subarachnoid hemorrhage (2021), HTN, IDDM,  being admitted with possible GI bleed and anemia of 7.8.  He presented from his facility after his family noted that he had dark stool.  Triage note also documents intermittent rectal bleeding for a week, for which his blood thinners' (suspect clopidogrel ) were stopped.  Hemoglobin checked at the facility was 7.7 (9-11 in 2024).  No documented history of colonoscopy or endoscopy.  Noted his hospitalization 2023 when he was noted to have dark stool but no endoluminal evaluation was done as it cleared up spontaneously.   In the ED, hemodynamically stable.  Labs notable for hemoglobin 7.8, slightly improved from 7.7 on earlier check at the facility.  Creatinine 1.69,  up from 0.91 a year ago. No imaging done. Rectal exam performed by ED provider revealed brown stool that was Hemoccult positive    Hospital Course:   # Normocytic anemia Has been followed by heme for this, last visit last year. Hgb has down-trended since then, now in 7s (was 10 last year). Report of dark stool; here stool has been brown. Suspect worsening of chronic disease anemia, but we are evaluating for possible occult GI bleed. EGD and colonosocpy performed, no source of bleeding identified - advise close monitoring of anemia - f/u pathology (polypectomy) - f/u Dr. 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 # HTN      Procedures: Egd, colonoscopy   Consultations: GI  Discharge Exam: Vitals:   01/21/24 1205 01/21/24 1215  BP: 123/68 (!) 144/73  Pulse: 70 73  Resp: (!) 9   Temp:    SpO2: 100% 100%    General: NAD Cardiovascular: RRR Respiratory: CTAB Abdomen: soft, non-tender  Discharge Instructions   Discharge Instructions     Diet - low sodium heart healthy   Complete by: As directed    Increase activity slowly   Complete by: As directed       Allergies as of 01/21/2024   No Known Allergies      Medication List     PAUSE  taking these medications    Lantus  SoloStar 100 UNIT/ML Solostar Pen Wait to take this until your doctor or other care provider tells you to start again. Generic drug: insulin  glargine Inject 26 Units into the skin daily.       STOP taking these medications    escitalopram 10 MG tablet Commonly known as: LEXAPRO   insulin  glargine-yfgn 100 UNIT/ML Pen Commonly known as: SEMGLEE    isosorbide  mononitrate 10 MG  tablet Commonly known as: ISMO    ondansetron  8 MG tablet Commonly known as: ZOFRAN        TAKE these medications    acetaminophen  325 MG tablet Commonly known as: TYLENOL  Take 2 tablets (650 mg total) by mouth every 4 (four) hours as needed for mild pain (or temp > 37.5 C (99.5 F)).   atorvastatin  80 MG tablet Commonly known as: LIPITOR  Take 1 tablet (80 mg total) by mouth daily.   Biofreeze Cool The Pain 4 % Gel Generic drug: Menthol  (Topical Analgesic) Apply topically.   clopidogrel  75 MG tablet Commonly known as: PLAVIX  Take 1 tablet (75 mg total) by mouth daily.   donepezil  5 MG tablet Commonly known as: ARICEPT  Take 5 mg by mouth daily.   empagliflozin  10 MG Tabs tablet Commonly known as: Jardiance  Take 1 tablet (10 mg total) by mouth daily before breakfast.   Entresto  49-51 MG Generic drug: sacubitril -valsartan  Take 1 tablet by mouth 2 (two) times daily.   feeding supplement (GLUCERNA SHAKE) Liqd Take 237 mLs by mouth 2 (two) times daily between meals.   ferrous sulfate  324 (65 Fe) MG Tbec Take 1 tablet by mouth daily.   FLUoxetine  10 MG capsule Commonly known as: PROZAC  Take 10 mg by mouth daily.   furosemide  40 MG tablet Commonly known as: LASIX  Take 40 mg by mouth daily.   gentamicin ointment 0.1 % Commonly known as: GARAMYCIN Apply 1 Application topically 3 (three) times daily.   hydrALAZINE  100 MG tablet Commonly known as: APRESOLINE  Take 100 mg by mouth 3 (three) times daily.   Insulin  Aspart FlexPen 100 UNIT/ML Commonly known as: NOVOLOG  Inject 2-12 Units into the skin 2 (two) times daily. Before meals and at bedtime   isosorbide  dinitrate 5 MG tablet Commonly known as: ISORDIL  Take 5 mg by mouth 3 (three) times daily.   medroxyPROGESTERone 10 MG tablet Commonly known as: PROVERA Take 10 mg by mouth 2 (two) times daily.   multivitamin with minerals Tabs tablet Take 1 tablet by mouth daily.   potassium chloride  SA 20 MEQ  tablet Commonly known as: KLOR-CON  M Take 1 tablet (20 mEq total) by mouth daily.   promethazine 25 MG/ML injection Commonly known as: PHENERGAN Inject 25 mg into the muscle every 8 (eight) hours as needed for nausea or vomiting.   Vitamin D3 125 MCG (5000 UT) Caps Take 1 capsule by mouth daily.       No Known Allergies  Follow-up Information     Henry Ingle, MD Follow up.   Specialty: Internal Medicine Contact information: [address] [place] [address] [phone]         Lonn Hicks, MD Follow up.   Specialty: Hematology and Oncology Why: call to schedule Contact information: [address] [place] [address] [phone]                  The results of significant diagnostics from this hospitalization (including imaging, microbiology, ancillary and laboratory) are listed below for reference.    Significant Diagnostic  Studies: DG Chest Port 1 View Result Date: 01/20/2024 EXAM: 1 VIEW XRAY OF THE CHEST 01/20/2024 12:19:24 AM COMPARISON: 09/15/2021 CLINICAL HISTORY: Shortness of breath. SOB. FINDINGS: LUNGS AND PLEURA: No focal pulmonary opacity. No pulmonary edema. No pleural effusion. No pneumothorax. HEART AND MEDIASTINUM: Stable mild cardiomegaly. Thoracic aortic atherosclerosis. BONES AND SOFT TISSUES: No acute osseous abnormality. IMPRESSION: 1. No acute abnormalities. Electronically signed by: Pinkie Pebbles MD 01/20/2024 12:20 AM EDT RP Workstation: [id number]    Microbiology: No results found for this or any previous visit (from the past 240 hours).   Labs: Basic Metabolic Panel: Recent Labs  Lab 01/19/24 1756 01/20/24 1437 01/21/24 1031  NA 140 142 140  K 4.3 4.4 4.1  CL 109 113* 113*  CO2 22 23 20*  GLUCOSE 133* 84 165*  BUN 24* 20 15  CREATININE 1.69* 1.25* 1.32*  CALCIUM  7.6* 7.6* 7.5*   Liver Function Tests: No results for input(s): AST, ALT, ALKPHOS, BILITOT, PROT, ALBUMIN in the last 168  hours. No results for input(s): LIPASE, AMYLASE in the last 168 hours. No results for input(s): AMMONIA in the last 168 hours. CBC: Recent Labs  Lab 01/19/24 1756 01/20/24 0204 01/21/24 1031  WBC 6.7  --  6.0  HGB 7.8* 7.2* 7.3*  HCT 23.9*  --  22.7*  MCV 92.6  --  92.3  PLT 262  --  231   Cardiac Enzymes: No results for input(s): CKTOTAL, CKMB, CKMBINDEX, TROPONINI in the last 168 hours. BNP: BNP (last 3 results) No results for input(s): BNP in the last 8760 hours.  ProBNP (last 3 results) No results for input(s): PROBNP in the last 8760 hours.  CBG: Recent Labs  Lab 01/20/24 1947 01/21/24 0033 01/21/24 0357 01/21/24 0727 01/21/24 1105  GLUCAP 198* 103* 102* 95 146*       Signed:  Devaughn KATHEE Ban MD.  [facility] 01/21/2024, 1:03 PM

## 2024-01-21 NOTE — TOC Transition Note (Signed)
 Transition of Care [facility]) - Discharge Note   Patient Details  Name: Manuel Chapman MRN: [id number] Date of Birth: Sep 28, 1945  [facility] CM/SW Contact:  [place] L Lacresia Darwish, LCSW Phone Number: 01/21/2024, 1:17 PM   Clinical Narrative:     Patient medically stable for discharge. CSW contacted [facility] to confirm that patient can return today. Facility is ready to receive patient. DC was faxed to [facility] for review. RN will call to report. [facility] has been contacted and transportation is scheduled.   Family member, Steffan Louder, has been notified. TOC signing off.   Final next level of care: Skilled Nursing Facility Barriers to Discharge: Barriers Resolved   Patient Goals and CMS Choice   CMS Medicare.gov Compare Post Acute Care list provided to:: Patient Choice offered to / list presented to : Patient [place] ownership interest in [facility].provided to:: Patient    Discharge Placement                Patient to be transferred to facility by: [facility] Name of family member notified: Johnson,Victor (Relative)  [phone] Patient and family notified of of transfer: 01/21/24  Discharge Plan and Services Additional resources added to the After Visit Summary for     Discharge Planning Services: CM Consult                                 Social Drivers of Health (SDOH) Interventions SDOH Screenings   Food Insecurity: No Food Insecurity (01/19/2024)  Housing: Low Risk  (01/19/2024)  Transportation Needs: No Transportation Needs (01/19/2024)  Utilities: Not At Risk (01/19/2024)  Alcohol Screen: Low Risk  (09/17/2021)  Depression (PHQ2-9): Low Risk  (09/09/2021)  Financial Resource Strain: Low Risk  (09/17/2021)  Social Connections: Unknown (01/19/2024)  Tobacco Use: Medium Risk (01/21/2024)     Readmission Risk Interventions     No data to display

## 2024-01-21 NOTE — Anesthesia Postprocedure Evaluation (Signed)
 Anesthesia Post Note  Patient: Manuel Chapman  Procedure(s) Performed: COLONOSCOPY POLYPECTOMY, INTESTINE  Patient location during evaluation: PACU Anesthesia Type: General Level of consciousness: awake and alert Pain management: pain level controlled Vital Signs Assessment: post-procedure vital signs reviewed and stable Respiratory status: spontaneous breathing, nonlabored ventilation and respiratory function stable Cardiovascular status: blood pressure returned to baseline and stable Postop Assessment: no apparent nausea or vomiting Anesthetic complications: no   No notable events documented.   Last Vitals:  Vitals:   01/21/24 1205 01/21/24 1215  BP: 123/68 (!) 144/73  Pulse: 70 73  Resp: (!) 9 18  Temp:    SpO2: 100% 100%    Last Pain:  Vitals:   01/21/24 1215  TempSrc:   PainSc: 0-No pain                 Camellia Merilee Louder

## 2024-01-21 NOTE — Interval H&P Note (Signed)
 History and Physical Interval Note:  01/21/2024 11:33 AM  Manuel Chapman  has presented today for surgery, with the diagnosis of Colonoscopy.  The various methods of treatment have been discussed with the patient and family. After consideration of risks, benefits and other options for treatment, the patient has consented to  Procedure(s): COLONOSCOPY (N/A) as a surgical intervention.  The patient's history has been reviewed, patient examined, no change in status, stable for surgery.  I have reviewed the patient's chart and labs.  Questions were answered to the patient's satisfaction.     [place], Labrisha Wuellner

## 2024-01-23 ENCOUNTER — Encounter: Payer: Self-pay | Admitting: Internal Medicine

## 2024-01-23 ENCOUNTER — Ambulatory Visit: Payer: Self-pay | Admitting: Gastroenterology

## 2024-01-23 LAB — SURGICAL PATHOLOGY

## 2024-01-23 NOTE — Telephone Encounter (Signed)
 Referral has been faxed x 3 to [facility] for large duodenal polyp removal

## 2024-01-24 LAB — SURGICAL PATHOLOGY

## 2024-02-13 ENCOUNTER — Encounter: Payer: Self-pay | Admitting: Oncology

## 2024-02-13 ENCOUNTER — Inpatient Hospital Stay

## 2024-02-13 ENCOUNTER — Inpatient Hospital Stay: Attending: Oncology | Admitting: Oncology

## 2024-02-13 VITALS — BP 150/70 | HR 67 | Temp 98.7°F | Resp 18 | Ht 66.0 in | Wt 225.0 lb

## 2024-02-13 DIAGNOSIS — Z79899 Other long term (current) drug therapy: Secondary | ICD-10-CM | POA: Diagnosis not present

## 2024-02-13 DIAGNOSIS — Z7902 Long term (current) use of antithrombotics/antiplatelets: Secondary | ICD-10-CM | POA: Diagnosis not present

## 2024-02-13 DIAGNOSIS — D649 Anemia, unspecified: Secondary | ICD-10-CM | POA: Diagnosis present

## 2024-02-13 DIAGNOSIS — E119 Type 2 diabetes mellitus without complications: Secondary | ICD-10-CM | POA: Diagnosis not present

## 2024-02-13 DIAGNOSIS — G8929 Other chronic pain: Secondary | ICD-10-CM | POA: Diagnosis not present

## 2024-02-13 DIAGNOSIS — Z87891 Personal history of nicotine dependence: Secondary | ICD-10-CM | POA: Insufficient documentation

## 2024-02-13 DIAGNOSIS — M25512 Pain in left shoulder: Secondary | ICD-10-CM | POA: Insufficient documentation

## 2024-02-13 DIAGNOSIS — I10 Essential (primary) hypertension: Secondary | ICD-10-CM | POA: Diagnosis not present

## 2024-02-13 DIAGNOSIS — Z7984 Long term (current) use of oral hypoglycemic drugs: Secondary | ICD-10-CM | POA: Diagnosis not present

## 2024-02-13 DIAGNOSIS — Z8673 Personal history of transient ischemic attack (TIA), and cerebral infarction without residual deficits: Secondary | ICD-10-CM | POA: Diagnosis not present

## 2024-02-13 DIAGNOSIS — Z794 Long term (current) use of insulin: Secondary | ICD-10-CM | POA: Diagnosis not present

## 2024-02-13 LAB — CBC (CANCER CENTER ONLY)
HCT: 30.3 % — ABNORMAL LOW (ref 39.0–52.0)
Hemoglobin: 9.7 g/dL — ABNORMAL LOW (ref 13.0–17.0)
MCH: 29.9 pg (ref 26.0–34.0)
MCHC: 32 g/dL (ref 30.0–36.0)
MCV: 93.5 fL (ref 80.0–100.0)
Platelet Count: 265 K/uL (ref 150–400)
RBC: 3.24 MIL/uL — ABNORMAL LOW (ref 4.22–5.81)
RDW: 14.1 % (ref 11.5–15.5)
WBC Count: 10.2 K/uL (ref 4.0–10.5)
nRBC: 0 % (ref 0.0–0.2)

## 2024-02-13 LAB — VITAMIN B12: Vitamin B-12: 390 pg/mL (ref 180–914)

## 2024-02-13 LAB — IRON AND TIBC
Iron: 79 ug/dL (ref 45–182)
Saturation Ratios: 30 % (ref 17.9–39.5)
TIBC: 265 ug/dL (ref 250–450)
UIBC: 186 ug/dL

## 2024-02-13 LAB — FOLATE: Folate: 11.3 ng/mL (ref >5.9)

## 2024-02-13 LAB — FERRITIN: Ferritin: 84 ng/mL (ref 24–336)

## 2024-02-13 LAB — LACTATE DEHYDROGENASE: LDH: 242 U/L — ABNORMAL HIGH (ref 98–192)

## 2024-02-13 LAB — RETICULOCYTES
Immature Retic Fract: 6.4 % (ref 2.3–15.9)
RBC.: 3.39 MIL/uL — ABNORMAL LOW (ref 4.22–5.81)
Retic Count, Absolute: 73.2 K/uL (ref 19.0–186.0)
Retic Ct Pct: 2.2 % (ref 0.4–3.1)

## 2024-02-13 NOTE — Progress Notes (Signed)
 [facility]  Telephone:([phone] Fax:([phone]) [phone]  ID: Broady Lafoy OB: 07-Jul-1945  MR#: [id number]  RDW#:[id number]  Patient Care Team: Henry Ingle, MD as PCP - General (Internal Medicine)  CHIEF COMPLAINT: Anemia, unspecified.  INTERVAL HISTORY: Patient is an 78 year old male with longstanding history of anemia who is referred for further evaluation.  He has a history of multiple CVAs.  He currently feels well and is at his baseline.  His only complaint is of chronic left shoulder pain.  He has no new neurologic complaints.  He denies any recent fevers.  He has a good appetite and denies weight loss.  He has no chest pain, shortness of breath, cough, or hemoptysis.  He denies any nausea, vomiting, constipation, or diarrhea.  He has no melena or hematochezia.  He has no urinary complaints.  Patient offers no further specific complaints today.  REVIEW OF SYSTEMS:   Review of Systems  Constitutional:  Positive for malaise/fatigue. Negative for fever and weight loss.  Respiratory: Negative.  Negative for cough, hemoptysis and shortness of breath.   Cardiovascular: Negative.  Negative for chest pain and leg swelling.  Gastrointestinal: Negative.  Negative for abdominal pain, blood in stool and melena.  Genitourinary: Negative.  Negative for dysuria.  Musculoskeletal:  Positive for joint pain.  Skin: Negative.  Negative for rash.  Neurological:  Positive for weakness. Negative for dizziness, focal weakness and headaches.  Psychiatric/Behavioral: Negative.  The patient is not nervous/anxious.     As per HPI. Otherwise, a complete review of systems is negative.  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) 09/16/2021   CVA (cerebral vascular accident) (HCC) 2020   Diabetes mellitus    Hypertension    ICH  (intracerebral hemorrhage) (HCC) 11/2019   Iron deficiency anemia 09/16/2021   Metabolic acidosis 09/16/2021    PAST SURGICAL HISTORY: Past Surgical History:  Procedure Laterality Date   COLONOSCOPY N/A 01/21/2024   Procedure: COLONOSCOPY;  Surgeon: Toledo, Ladell POUR, MD;  Location: [facility] ENDOSCOPY;  Service: Gastroenterology;  Laterality: N/A;   ESOPHAGOGASTRODUODENOSCOPY N/A 01/20/2024   Procedure: EGD (ESOPHAGOGASTRODUODENOSCOPY);  Surgeon: Jinny Carmine, MD;  Location: [facility] ENDOSCOPY;  Service: Endoscopy;  Laterality: N/A;   POLYPECTOMY  01/21/2024   Procedure: POLYPECTOMY, INTESTINE;  Surgeon: Toledo, Ladell POUR, MD;  Location: [facility] ENDOSCOPY;  Service: Gastroenterology;;    FAMILY HISTORY: Family History  Problem Relation Age of Onset   Diabetes Neg Hx    Stroke Neg Hx     ADVANCED DIRECTIVES (Y/N):  N  HEALTH MAINTENANCE: Social History   Tobacco Use   Smoking status: Former    Current packs/day: 0.00    Average packs/day: 1 pack/day for 10.0 years (10.0 ttl pk-yrs)    Types: Cigarettes    Start date: 68    Quit date: 1998    Years since quitting: 27.8   Smokeless tobacco: Never  Vaping Use   Vaping status: Never Used  Substance Use Topics   Alcohol use: Not Currently    Comment: not much, h/o heavy beer use   Drug use: No     Colonoscopy:  PAP:  Bone density:  Lipid panel:  No Known Allergies  Current Outpatient Medications  Medication Sig Dispense Refill   atorvastatin  (LIPITOR ) 80 MG tablet Take 1 tablet (80 mg total) by mouth daily.     BIOFREEZE COOL THE PAIN 4 % GEL  Apply topically.     Cholecalciferol (VITAMIN D3) 125 MCG (5000 UT) CAPS Take 1 capsule by mouth daily.     clopidogrel  (PLAVIX ) 75 MG tablet Take 1 tablet (75 mg total) by mouth daily.     donepezil  (ARICEPT ) 5 MG tablet Take 5 mg by mouth daily.     empagliflozin  (JARDIANCE ) 10 MG TABS tablet Take 1 tablet (10 mg total) by mouth daily before breakfast. 30 tablet 11   ENTRESTO  49-51 MG  Take 1 tablet by mouth 2 (two) times daily.     feeding supplement, GLUCERNA SHAKE, (GLUCERNA SHAKE) LIQD Take 237 mLs by mouth 2 (two) times daily between meals.     ferrous sulfate  324 (65 Fe) MG TBEC Take 1 tablet by mouth daily.     FLUoxetine  (PROZAC ) 10 MG capsule Take 10 mg by mouth daily.     furosemide  (LASIX ) 40 MG tablet Take 40 mg by mouth daily.     gentamicin ointment (GARAMYCIN) 0.1 % Apply 1 Application topically 3 (three) times daily.     hydrALAZINE  (APRESOLINE ) 100 MG tablet Take 100 mg by mouth 3 (three) times daily.     Insulin  Aspart FlexPen (NOVOLOG ) 100 UNIT/ML Inject 2-12 Units into the skin 2 (two) times daily. Before meals and at bedtime     isosorbide  dinitrate (ISORDIL ) 5 MG tablet Take 5 mg by mouth 3 (three) times daily.     [Paused] LANTUS  SOLOSTAR 100 UNIT/ML Solostar Pen Inject 26 Units into the skin daily.     medroxyPROGESTERone (PROVERA) 10 MG tablet Take 10 mg by mouth 2 (two) times daily.     Multiple Vitamin (MULTIVITAMIN WITH MINERALS) TABS tablet Take 1 tablet by mouth daily.     potassium chloride  SA (KLOR-CON  M) 20 MEQ tablet Take 1 tablet (20 mEq total) by mouth daily. 90 tablet 3   promethazine (PHENERGAN) 25 MG/ML injection Inject 25 mg into the muscle every 8 (eight) hours as needed for nausea or vomiting.     No current facility-administered medications for this visit.    OBJECTIVE: Vitals:   02/13/24 1001  BP: (!) 150/70  Pulse: 67  Resp: 18  Temp: 98.7 F (37.1 C)  SpO2: 98%     Body mass index is 36.32 kg/m.    ECOG FS:2 - Symptomatic, <50% confined to bed  General: Well-developed, well-nourished, no acute distress.  Sitting in a wheelchair. Eyes: Pink conjunctiva, anicteric sclera. HEENT: Normocephalic, moist mucous membranes. Lungs: No audible wheezing or coughing. Heart: Regular rate and rhythm. Abdomen: Soft, nontender, no obvious distention. Musculoskeletal: No edema, cyanosis, or clubbing. Neuro: Alert, answering all  questions appropriately. Cranial nerves grossly intact. Skin: No rashes or petechiae noted. Psych: Normal affect. Lymphatics: No cervical, calvicular, axillary or inguinal LAD.   LAB RESULTS:  Lab Results  Component Value Date   NA 140 01/21/2024   K 4.1 01/21/2024   CL 113 (H) 01/21/2024   CO2 20 (L) 01/21/2024   GLUCOSE 165 (H) 01/21/2024   BUN 15 01/21/2024   CREATININE 1.32 (H) 01/21/2024   CALCIUM  7.5 (L) 01/21/2024   PROT 6.6 11/02/2021   ALBUMIN 3.2 (L) 11/02/2021   AST 29 11/02/2021   ALT 27 11/02/2021   ALKPHOS 124 11/02/2021   BILITOT 0.8 11/02/2021   GFRNONAA 55 (L) 01/21/2024   GFRAA >60 12/04/2019    Lab Results  Component Value Date   WBC 10.2 02/13/2024   NEUTROABS 3.6 05/03/2022   HGB 9.7 (L) 02/13/2024   HCT 30.3 (  L) 02/13/2024   MCV 93.5 02/13/2024   PLT 265 02/13/2024     STUDIES: DG Chest Port 1 View Result Date: 01/20/2024 EXAM: 1 VIEW XRAY OF THE CHEST 01/20/2024 12:19:24 AM COMPARISON: 09/15/2021 CLINICAL HISTORY: Shortness of breath. SOB. FINDINGS: LUNGS AND PLEURA: No focal pulmonary opacity. No pulmonary edema. No pleural effusion. No pneumothorax. HEART AND MEDIASTINUM: Stable mild cardiomegaly. Thoracic aortic atherosclerosis. BONES AND SOFT TISSUES: No acute osseous abnormality. IMPRESSION: 1. No acute abnormalities. Electronically signed by: Pinkie Pebbles MD 01/20/2024 12:20 AM EDT RP Workstation: [id number]    ASSESSMENT: Anemia, unspecified.  PLAN:    Anemia, unspecified: Patient's hemoglobin remains decreased, but has trended up to 9.7.  Iron stores and folate level are within normal limits.  He has a mildly elevated LDH.  All of his other laboratory work is pending at time of dictation.  He does not require IV iron or Retacrit at this time, but could consider in the future if necessary.  He does not require bone marrow biopsy.  Follow-up will be based on the remainder of his laboratory work.   I spent a total of 45 minutes  reviewing chart data, face-to-face evaluation with the patient, counseling and coordination of care as detailed above.   Patient expressed understanding and was in agreement with this plan. He also understands that He can call clinic at any time with any questions, concerns, or complaints.     Evalene JINNY Reusing, MD   02/13/2024 1:15 PM

## 2024-02-13 NOTE — Progress Notes (Signed)
 Patient states that his left arm started hurting him as soon as he got here, his blood pressure is 150/70, I am checking it again in a few minutes. Other than that he is doing ok.

## 2024-02-14 LAB — PROTEIN ELECTROPHORESIS, SERUM
A/G Ratio: 0.8 (ref 0.7–1.7)
Albumin ELP: 2.8 g/dL — ABNORMAL LOW (ref 2.9–4.4)
Alpha-1-Globulin: 0.2 g/dL (ref 0.0–0.4)
Alpha-2-Globulin: 0.8 g/dL (ref 0.4–1.0)
Beta Globulin: 1.1 g/dL (ref 0.7–1.3)
Gamma Globulin: 1.4 g/dL (ref 0.4–1.8)
Globulin, Total: 3.5 g/dL (ref 2.2–3.9)
Total Protein ELP: 6.3 g/dL (ref 6.0–8.5)

## 2024-02-14 LAB — HAPTOGLOBIN: Haptoglobin: 258 mg/dL (ref 34–355)

## 2024-02-14 LAB — ERYTHROPOIETIN: Erythropoietin: 11 m[IU]/mL (ref 2.6–18.5)

## 2024-02-22 LAB — MYELOID NGS
